# Patient Record
Sex: Female | Born: 1985
Health system: Southern US, Community
[De-identification: ages and names within clinical notes are randomized; demographics above are authoritative.]

## PROBLEM LIST (undated history)

## (undated) ENCOUNTER — Emergency Department (HOSPITAL_COMMUNITY): Admission: EM | Payer: 59

## (undated) DIAGNOSIS — J9690 Respiratory failure, unspecified, unspecified whether with hypoxia or hypercapnia: Secondary | ICD-10-CM

## (undated) DIAGNOSIS — L732 Hidradenitis suppurativa: Secondary | ICD-10-CM

## (undated) DIAGNOSIS — E785 Hyperlipidemia, unspecified: Secondary | ICD-10-CM

## (undated) DIAGNOSIS — K760 Fatty (change of) liver, not elsewhere classified: Secondary | ICD-10-CM

## (undated) DIAGNOSIS — R112 Nausea with vomiting, unspecified: Secondary | ICD-10-CM

## (undated) DIAGNOSIS — G4730 Sleep apnea, unspecified: Secondary | ICD-10-CM

## (undated) DIAGNOSIS — Z9889 Other specified postprocedural states: Secondary | ICD-10-CM

## (undated) DIAGNOSIS — E282 Polycystic ovarian syndrome: Secondary | ICD-10-CM

## (undated) DIAGNOSIS — N6019 Diffuse cystic mastopathy of unspecified breast: Secondary | ICD-10-CM

## (undated) DIAGNOSIS — E669 Obesity, unspecified: Secondary | ICD-10-CM

## (undated) DIAGNOSIS — K7460 Unspecified cirrhosis of liver: Secondary | ICD-10-CM

## (undated) DIAGNOSIS — F17200 Nicotine dependence, unspecified, uncomplicated: Secondary | ICD-10-CM

## (undated) DIAGNOSIS — J45909 Unspecified asthma, uncomplicated: Secondary | ICD-10-CM

## (undated) DIAGNOSIS — K219 Gastro-esophageal reflux disease without esophagitis: Secondary | ICD-10-CM

## (undated) DIAGNOSIS — A498 Other bacterial infections of unspecified site: Secondary | ICD-10-CM

## (undated) DIAGNOSIS — Z87442 Personal history of urinary calculi: Secondary | ICD-10-CM

## (undated) HISTORY — DX: Obesity, unspecified: E66.9

## (undated) HISTORY — DX: Hyperlipidemia, unspecified: E78.5

## (undated) HISTORY — DX: Hidradenitis suppurativa: L73.2

## (undated) HISTORY — DX: Other bacterial infections of unspecified site: A49.8

## (undated) HISTORY — DX: Nicotine dependence, unspecified, uncomplicated: F17.200

---
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
## 2004-06-09 ENCOUNTER — Emergency Department (HOSPITAL_COMMUNITY): Admission: EM | Admit: 2004-06-09 | Discharge: 2004-06-09 | Payer: Self-pay | Admitting: Emergency Medicine

## 2007-04-08 ENCOUNTER — Emergency Department (HOSPITAL_COMMUNITY): Admission: EM | Admit: 2007-04-08 | Discharge: 2007-04-08 | Payer: Self-pay | Admitting: Emergency Medicine

## 2008-04-20 ENCOUNTER — Encounter (INDEPENDENT_AMBULATORY_CARE_PROVIDER_SITE_OTHER): Payer: Self-pay | Admitting: *Deleted

## 2008-06-08 ENCOUNTER — Encounter: Payer: Self-pay | Admitting: Gastroenterology

## 2008-06-08 ENCOUNTER — Ambulatory Visit: Payer: Self-pay | Admitting: Internal Medicine

## 2008-06-08 DIAGNOSIS — K7689 Other specified diseases of liver: Secondary | ICD-10-CM

## 2008-06-08 DIAGNOSIS — R1011 Right upper quadrant pain: Secondary | ICD-10-CM | POA: Insufficient documentation

## 2008-06-08 DIAGNOSIS — R740 Nonspecific elevation of levels of transaminase and lactic acid dehydrogenase [LDH]: Secondary | ICD-10-CM

## 2008-06-08 DIAGNOSIS — R7401 Elevation of levels of liver transaminase levels: Secondary | ICD-10-CM | POA: Insufficient documentation

## 2008-06-08 DIAGNOSIS — K7581 Nonalcoholic steatohepatitis (NASH): Secondary | ICD-10-CM | POA: Insufficient documentation

## 2008-06-09 ENCOUNTER — Telehealth (INDEPENDENT_AMBULATORY_CARE_PROVIDER_SITE_OTHER): Payer: Self-pay

## 2008-06-09 ENCOUNTER — Encounter: Payer: Self-pay | Admitting: Internal Medicine

## 2008-06-09 ENCOUNTER — Encounter: Payer: Self-pay | Admitting: Gastroenterology

## 2008-06-19 ENCOUNTER — Encounter: Payer: Self-pay | Admitting: Gastroenterology

## 2008-06-19 LAB — CONVERTED CEMR LAB
AST: 56 units/L — ABNORMAL HIGH (ref 0–37)
Alkaline Phosphatase: 65 units/L (ref 39–117)
Bilirubin, Direct: 0.1 mg/dL (ref 0.0–0.3)
Hep B S Ab: NEGATIVE
Hepatitis B Surface Ag: NEGATIVE
Indirect Bilirubin: 0.5 mg/dL (ref 0.0–0.9)
Total Protein: 7 g/dL (ref 6.0–8.3)

## 2008-06-21 ENCOUNTER — Encounter: Payer: Self-pay | Admitting: Gastroenterology

## 2008-06-21 ENCOUNTER — Ambulatory Visit (HOSPITAL_COMMUNITY): Admission: RE | Admit: 2008-06-21 | Discharge: 2008-06-21 | Payer: Self-pay | Admitting: Gastroenterology

## 2008-06-23 ENCOUNTER — Telehealth: Payer: Self-pay | Admitting: Gastroenterology

## 2008-09-01 ENCOUNTER — Encounter (INDEPENDENT_AMBULATORY_CARE_PROVIDER_SITE_OTHER): Payer: Self-pay

## 2008-12-19 LAB — CONVERTED CEMR LAB
ALT: 51 units/L — ABNORMAL HIGH (ref 0–35)
Albumin: 4.2 g/dL (ref 3.5–5.2)
Alkaline Phosphatase: 73 units/L (ref 39–117)
Total Bilirubin: 0.5 mg/dL (ref 0.3–1.2)

## 2009-02-06 ENCOUNTER — Telehealth (INDEPENDENT_AMBULATORY_CARE_PROVIDER_SITE_OTHER): Payer: Self-pay

## 2009-04-10 ENCOUNTER — Ambulatory Visit (HOSPITAL_COMMUNITY): Admission: RE | Admit: 2009-04-10 | Discharge: 2009-04-10 | Payer: Self-pay | Admitting: Internal Medicine

## 2009-05-11 ENCOUNTER — Other Ambulatory Visit: Admission: RE | Admit: 2009-05-11 | Discharge: 2009-05-11 | Payer: Self-pay | Admitting: Obstetrics and Gynecology

## 2009-10-03 ENCOUNTER — Ambulatory Visit (HOSPITAL_COMMUNITY): Admission: RE | Admit: 2009-10-03 | Discharge: 2009-10-03 | Payer: Self-pay | Admitting: Specialist

## 2010-01-27 ENCOUNTER — Encounter: Payer: Self-pay | Admitting: Family Medicine

## 2010-02-05 NOTE — Progress Notes (Signed)
Summary: hep b injections and bloodwork  Phone Note Outgoing Call Call back at [facility] Phone [phone]   Summary of Call: called pt to see if she has received her hep b series so she can have bloodwork done to determine immunity. pt stated she has not received the last injection yet and will call us when she does and we will send her order for hep B surface Ab, Quant. Initial call taken by: Hendricks Limes LPN,  February 06, 2009 3:54 PM

## 2010-05-16 ENCOUNTER — Other Ambulatory Visit (HOSPITAL_COMMUNITY)
Admission: RE | Admit: 2010-05-16 | Discharge: 2010-05-16 | Disposition: A | Discharge status: Home / Self Care | Payer: 59 | Source: Ambulatory Visit | Attending: Obstetrics and Gynecology | Admitting: Obstetrics and Gynecology

## 2010-05-16 ENCOUNTER — Other Ambulatory Visit: Payer: Self-pay | Admitting: Adult Health

## 2010-05-16 DIAGNOSIS — Z01419 Encounter for gynecological examination (general) (routine) without abnormal findings: Secondary | ICD-10-CM | POA: Insufficient documentation

## 2010-09-12 ENCOUNTER — Other Ambulatory Visit (HOSPITAL_COMMUNITY): Payer: Self-pay | Admitting: Internal Medicine

## 2010-09-12 ENCOUNTER — Ambulatory Visit (HOSPITAL_COMMUNITY)
Admission: RE | Admit: 2010-09-12 | Discharge: 2010-09-12 | Disposition: A | Discharge status: Home / Self Care | Payer: 59 | Source: Ambulatory Visit | Attending: Internal Medicine | Admitting: Internal Medicine

## 2010-09-12 DIAGNOSIS — R059 Cough, unspecified: Secondary | ICD-10-CM

## 2010-09-12 DIAGNOSIS — R0602 Shortness of breath: Secondary | ICD-10-CM | POA: Insufficient documentation

## 2010-09-12 DIAGNOSIS — R05 Cough: Secondary | ICD-10-CM

## 2010-09-12 DIAGNOSIS — R0789 Other chest pain: Secondary | ICD-10-CM | POA: Insufficient documentation

## 2010-10-13 ENCOUNTER — Encounter: Payer: Self-pay | Admitting: *Deleted

## 2010-10-13 ENCOUNTER — Emergency Department (HOSPITAL_COMMUNITY)
Admission: EM | Admit: 2010-10-13 | Discharge: 2010-10-13 | Disposition: A | Discharge status: Home / Self Care | Payer: 59 | Attending: Emergency Medicine | Admitting: Emergency Medicine

## 2010-10-13 DIAGNOSIS — M79609 Pain in unspecified limb: Secondary | ICD-10-CM | POA: Insufficient documentation

## 2010-10-13 DIAGNOSIS — M109 Gout, unspecified: Secondary | ICD-10-CM | POA: Insufficient documentation

## 2010-10-13 DIAGNOSIS — F17200 Nicotine dependence, unspecified, uncomplicated: Secondary | ICD-10-CM | POA: Insufficient documentation

## 2010-10-13 HISTORY — DX: Gastro-esophageal reflux disease without esophagitis: K21.9

## 2010-10-13 HISTORY — DX: Fatty (change of) liver, not elsewhere classified: K76.0

## 2010-10-13 HISTORY — DX: Diffuse cystic mastopathy of unspecified breast: N60.19

## 2010-10-13 HISTORY — DX: Polycystic ovarian syndrome: E28.2

## 2010-10-13 MED ORDER — HYDROCODONE-ACETAMINOPHEN 5-325 MG PO TABS: 1.0000 | ORAL_TABLET | ORAL | Status: AC | PRN

## 2010-10-13 MED ORDER — HYDROCODONE-ACETAMINOPHEN 5-325 MG PO TABS: 1.0000 | ORAL_TABLET | ORAL | Status: DC | PRN

## 2010-10-13 MED ORDER — PREDNISONE 10 MG PO TABS: 20.0000 mg | ORAL_TABLET | Freq: Every day | ORAL | Status: AC

## 2010-10-13 MED ORDER — PREDNISONE 20 MG PO TABS
60.0000 mg | ORAL_TABLET | Freq: Once | ORAL | Status: AC
  Administered 2010-10-13: 60 mg via ORAL
  Filled 2010-10-13: qty 3

## 2010-10-13 MED ORDER — HYDROCODONE-ACETAMINOPHEN 5-325 MG PO TABS
2.0000 | ORAL_TABLET | Freq: Once | ORAL | Status: AC
  Administered 2010-10-13: 2 via ORAL
  Filled 2010-10-13: qty 2

## 2010-10-13 NOTE — ED Provider Notes (Signed)
History     CSN: [id number] Arrival date & time: 10/13/2010  4:20 AM  Chief Complaint  Patient presents with  . Gout    (Consider location/radiation/quality/duration/timing/severity/associated sxs/prior treatment) HPI Comments: Seen 0443. Patient with a history of gout on allopurinol and colchicine. Pain began 2 days ago and has not improved on her mediations. Unable to bear weight or tolerate any contact with great toe on the right foot. Denies fever, chills.   Patient is a 25 y.o. female presenting with lower extremity pain. The history is provided by the patient.  Foot Pain This is a new problem. The current episode started 2 days ago. The problem occurs constantly. The problem has been gradually worsening. Exacerbated by: walking, pressure, palpation. The symptoms are relieved by nothing. Treatments tried: allopurinol and cochicine. The treatment provided no relief.    Past Medical History  Diagnosis Date  . Diabetes mellitus   . Fibrocystic breast disease   . Acid reflux   . Gout   . Polycystic ovary syndrome   . Fatty liver     History reviewed. No pertinent past surgical history.  Family History  Problem Relation Age of Onset  . Diabetes Father   . Hypertension Father   . Stroke Other     History  Substance Use Topics  . Smoking status: Current Everyday Smoker  . Smokeless tobacco: Not on file  . Alcohol Use: No    OB History    Grav Para Term Preterm Abortions TAB SAB Ect Mult Living                  Review of Systems  Musculoskeletal:       Right great toe pain  All other systems reviewed and are negative.    Allergies  Influenza vaccine live  Home Medications   Current Outpatient Rx  Name Route Sig Dispense Refill  . ALLOPURINOL 100 MG PO TABS Oral Take 100 mg by mouth daily.      . COLCHICINE PO Oral Take by mouth.      . DEXLANSOPRAZOLE 60 MG PO CPDR Oral Take 60 mg by mouth daily.      Marland Kitchen METFORMIN HCL 500 MG (MOD) PO TB24 Oral Take 500  mg by mouth daily with breakfast.      . LOESTRIN 24 FE PO Oral Take by mouth.        BP 126/93  Pulse 84  Temp(Src) 98 F (36.7 C) (Oral)  Resp 20  Ht 5\' 10"  (1.778 m)  Wt 287 lb (130.182 kg)  BMI 41.18 kg/m2  SpO2 100%  LMP 08/11/2010  Physical Exam  Constitutional: She is oriented to person, place, and time. She appears well-developed and well-nourished. She appears distressed.  HENT:  Head: Normocephalic and atraumatic.  Eyes: EOM are normal.  Neck: Normal range of motion.  Cardiovascular: Normal rate, normal heart sounds and intact distal pulses.   Pulmonary/Chest: Effort normal and breath sounds normal.  Musculoskeletal:       Right great toe MCP with erythema and exquisite tenderness with palpation.No evidence of infection, no lesions, no drainage.  Neurological: She is alert and oriented to person, place, and time.  Skin: Skin is warm and dry.    ED Course  Procedures (including critical care time)  Patient with h/o gout here with gout in right great toe. Analgesics and prednisone administered.Pt feels improved after observation and/or treatment in ED. MDM Reviewed: nursing note, vitals and previous chart  Nicoletta Dress. Colon Branch, MD 10/13/10 [phone]

## 2011-01-06 ENCOUNTER — Encounter (HOSPITAL_COMMUNITY): Payer: Self-pay

## 2011-01-06 ENCOUNTER — Inpatient Hospital Stay (HOSPITAL_COMMUNITY)
Admission: EM | Admit: 2011-01-06 | Discharge: 2011-01-11 | DRG: 418 | Disposition: A | Discharge status: Home / Self Care | Payer: 59 | Attending: Internal Medicine | Admitting: Internal Medicine

## 2011-01-06 DIAGNOSIS — K529 Noninfective gastroenteritis and colitis, unspecified: Secondary | ICD-10-CM | POA: Diagnosis present

## 2011-01-06 DIAGNOSIS — R1011 Right upper quadrant pain: Secondary | ICD-10-CM | POA: Diagnosis present

## 2011-01-06 DIAGNOSIS — E871 Hypo-osmolality and hyponatremia: Secondary | ICD-10-CM | POA: Diagnosis present

## 2011-01-06 DIAGNOSIS — Z6841 Body Mass Index (BMI) 40.0 and over, adult: Secondary | ICD-10-CM

## 2011-01-06 DIAGNOSIS — J9819 Other pulmonary collapse: Secondary | ICD-10-CM | POA: Diagnosis not present

## 2011-01-06 DIAGNOSIS — K5289 Other specified noninfective gastroenteritis and colitis: Secondary | ICD-10-CM | POA: Diagnosis present

## 2011-01-06 DIAGNOSIS — E119 Type 2 diabetes mellitus without complications: Secondary | ICD-10-CM | POA: Diagnosis present

## 2011-01-06 DIAGNOSIS — Z720 Tobacco use: Secondary | ICD-10-CM | POA: Diagnosis present

## 2011-01-06 DIAGNOSIS — K810 Acute cholecystitis: Principal | ICD-10-CM | POA: Diagnosis present

## 2011-01-06 DIAGNOSIS — E1169 Type 2 diabetes mellitus with other specified complication: Secondary | ICD-10-CM | POA: Diagnosis present

## 2011-01-06 DIAGNOSIS — K7581 Nonalcoholic steatohepatitis (NASH): Secondary | ICD-10-CM | POA: Diagnosis present

## 2011-01-06 DIAGNOSIS — E876 Hypokalemia: Secondary | ICD-10-CM | POA: Diagnosis not present

## 2011-01-06 DIAGNOSIS — E669 Obesity, unspecified: Secondary | ICD-10-CM | POA: Diagnosis present

## 2011-01-06 DIAGNOSIS — E282 Polycystic ovarian syndrome: Secondary | ICD-10-CM | POA: Diagnosis present

## 2011-01-06 DIAGNOSIS — F17200 Nicotine dependence, unspecified, uncomplicated: Secondary | ICD-10-CM | POA: Diagnosis present

## 2011-01-06 DIAGNOSIS — K7689 Other specified diseases of liver: Secondary | ICD-10-CM | POA: Diagnosis present

## 2011-01-06 DIAGNOSIS — R7989 Other specified abnormal findings of blood chemistry: Secondary | ICD-10-CM | POA: Diagnosis present

## 2011-01-06 LAB — DIFFERENTIAL
Lymphocytes Relative: 9 % — ABNORMAL LOW (ref 12–46)
Lymphs Abs: 0.6 10*3/uL — ABNORMAL LOW (ref 0.7–4.0)
Monocytes Absolute: 0.5 10*3/uL (ref 0.1–1.0)
Monocytes Relative: 8 % (ref 3–12)

## 2011-01-06 LAB — URINALYSIS, ROUTINE W REFLEX MICROSCOPIC: Bilirubin Urine: NEGATIVE

## 2011-01-06 LAB — BASIC METABOLIC PANEL
BUN: 10 mg/dL (ref 6–23)
CO2: 19 mEq/L (ref 19–32)
GFR calc Af Amer: >90 mL/min (ref >90)
GFR calc non Af Amer: >90 mL/min (ref >90)
Glucose, Bld: 107 mg/dL — ABNORMAL HIGH (ref 70–99)
Potassium: 3.6 mEq/L (ref 3.5–5.1)
Sodium: 133 mEq/L — ABNORMAL LOW (ref 135–145)

## 2011-01-06 LAB — HEPATIC FUNCTION PANEL
Albumin: 2.9 g/dL — ABNORMAL LOW (ref 3.5–5.2)
Alkaline Phosphatase: 55 U/L (ref 39–117)
Indirect Bilirubin: 0.5 mg/dL (ref 0.3–0.9)
Total Protein: 5.9 g/dL — ABNORMAL LOW (ref 6.0–8.3)

## 2011-01-06 LAB — CBC
HCT: 47.4 % — ABNORMAL HIGH (ref 36.0–46.0)
MCV: 87.3 fL (ref 78.0–100.0)
WBC: 6.6 10*3/uL (ref 4.0–10.5)

## 2011-01-06 LAB — GLUCOSE, CAPILLARY: Glucose-Capillary: 90 mg/dL (ref 70–99)

## 2011-01-06 LAB — AMYLASE: Amylase: 30 U/L (ref 0–105)

## 2011-01-06 MED ORDER — HYDROMORPHONE HCL PF 1 MG/ML IJ SOLN
0.5000 mg | Freq: Once | INTRAMUSCULAR | Status: AC
  Administered 2011-01-06: 1 mg via INTRAVENOUS

## 2011-01-06 MED ORDER — SODIUM CHLORIDE 0.9 % IV BOLUS (SEPSIS)
1000.0000 mL | Freq: Once | INTRAVENOUS | Status: AC
  Administered 2011-01-06: 1000 mL via INTRAVENOUS

## 2011-01-06 MED ORDER — PROMETHAZINE HCL 25 MG/ML IJ SOLN
12.5000 mg | Freq: Once | INTRAMUSCULAR | Status: AC
  Administered 2011-01-06: 25 mg via INTRAVENOUS
  Filled 2011-01-06: qty 1

## 2011-01-06 MED ORDER — PROMETHAZINE HCL 25 MG/ML IJ SOLN
12.5000 mg | Freq: Once | INTRAMUSCULAR | Status: AC
  Administered 2011-01-06: 12.5 mg via INTRAVENOUS
  Filled 2011-01-06: qty 1

## 2011-01-06 MED ORDER — SODIUM CHLORIDE 0.9 % IV BOLUS (SEPSIS): 1000.0000 mL | Freq: Once | INTRAVENOUS | Status: DC

## 2011-01-06 MED ORDER — PANTOPRAZOLE SODIUM 40 MG IV SOLR
40.0000 mg | Freq: Once | INTRAVENOUS | Status: AC
  Administered 2011-01-06: 40 mg via INTRAVENOUS
  Filled 2011-01-06: qty 40

## 2011-01-06 MED ORDER — HYDROMORPHONE HCL PF 1 MG/ML IJ SOLN
1.0000 mg | Freq: Once | INTRAMUSCULAR | Status: DC
  Filled 2011-01-06: qty 1

## 2011-01-06 MED ORDER — HYDROMORPHONE HCL PF 1 MG/ML IJ SOLN
1.0000 mg | Freq: Once | INTRAMUSCULAR | Status: AC
  Administered 2011-01-06: 1 mg via INTRAVENOUS
  Filled 2011-01-06: qty 1

## 2011-01-06 MED ORDER — METOCLOPRAMIDE HCL 5 MG/ML IJ SOLN
10.0000 mg | Freq: Once | INTRAMUSCULAR | Status: AC
  Administered 2011-01-06: 10 mg via INTRAVENOUS
  Filled 2011-01-06: qty 2

## 2011-01-06 MED ORDER — ONDANSETRON HCL 4 MG/2ML IJ SOLN
4.0000 mg | Freq: Once | INTRAMUSCULAR | Status: AC
  Administered 2011-01-06: 4 mg via INTRAVENOUS
  Filled 2011-01-06: qty 2

## 2011-01-06 NOTE — ED Provider Notes (Signed)
History   This chart was scribed for [employer]. Colon Branch, MD by Melba Coon. The patient was seen in room APA04/APA04 and the patient's care was started at 5:18PM.    CSN: [id number]  Arrival date & time 01/06/11  1653   First MD Initiated Contact with Patient 01/06/11 1712      Chief Complaint  Patient presents with  . Emesis  . Fever  . Weakness  . Diarrhea  . Loss of Consciousness    (Consider location/radiation/quality/duration/timing/severity/associated sxs/prior treatment) HPI Cheryl Black is a 25 y.o. female who presents to the Emergency Department complaining of constant, moderate to severe emesis with an onset today. Patient also has diarrhea and pain near the gall bladder. Pt is a Engineer, civil (consulting) at [employer].  PCP: Dr. Margo Aye    Past Medical History  Diagnosis Date  . Diabetes mellitus   . Fibrocystic breast disease   . Acid reflux   . Gout   . Polycystic ovary syndrome   . Fatty liver     History reviewed. No pertinent past surgical history.  Family History  Problem Relation Age of Onset  . Diabetes Father   . Hypertension Father   . Stroke Other     History  Substance Use Topics  . Smoking status: Current Everyday Smoker  . Smokeless tobacco: Not on file  . Alcohol Use: No    OB History    Grav Para Term Preterm Abortions TAB SAB Ect Mult Living                  Review of Systems 10 Systems reviewed and are negative for acute change except as noted in the HPI.  Allergies  Influenza vaccine live  Home Medications   Current Outpatient Rx  Name Route Sig Dispense Refill  . ALLOPURINOL 100 MG PO TABS Oral Take 100 mg by mouth at bedtime.     . COLCHICINE 0.6 MG PO TABS Oral Take 0.6 mg by mouth at bedtime.      . DEXLANSOPRAZOLE 60 MG PO CPDR Oral Take 60 mg by mouth at bedtime.     Marland Kitchen METFORMIN HCL ER (MOD) 500 MG PO TB24 Oral Take 500 mg by mouth at bedtime.     Marland Kitchen LOESTRIN 24 FE PO Oral Take 1 tablet by mouth at bedtime.     Marland Kitchen  ONDANSETRON HCL 8 MG PO TABS Oral Take by mouth every 8 (eight) hours as needed. For nausea     . PROMETHAZINE HCL 25 MG PO TABS Oral Take 25 mg by mouth every 6 (six) hours as needed. For nausea     . HYDROCODONE-ACETAMINOPHEN 5-325 MG PO TABS Oral Take 1 tablet by mouth every 4 (four) hours as needed. For pain       BP 140/112  Pulse 132  Temp(Src) 98.9 F (37.2 C) (Oral)  Resp 26  Ht 5\' 9"  (1.753 m)  Wt 287 lb (130.182 kg)  BMI 42.38 kg/m2  SpO2 99%  Physical Exam  Nursing note and vitals reviewed. Constitutional: She is oriented to person, place, and time. She appears well-developed and well-nourished. No distress.  HENT:  Head: Normocephalic and atraumatic.  Right Ear: External ear normal.  Left Ear: External ear normal.  Eyes: Conjunctivae and EOM are normal. Pupils are equal, round, and reactive to light.  Neck: Normal range of motion. Neck supple. No tracheal deviation present.  Cardiovascular: Regular rhythm and normal heart sounds.  Exam reveals no gallop and no friction  rub.   No murmur heard.      Slightly tachycardic  Pulmonary/Chest: Effort normal and breath sounds normal. No respiratory distress. She has no wheezes. She has no rales.  Abdominal: Soft. Bowel sounds are normal. She exhibits no distension. There is tenderness (Focal tenderness on right sided costal margin, but no RUQ focal tenderness).       Hyperactive bowel sounds.  Musculoskeletal: Normal range of motion. She exhibits no edema.  Neurological: She is alert and oriented to person, place, and time. No sensory deficit.  Skin: Skin is warm and dry. No rash noted.  Psychiatric: She has a normal mood and affect. Her behavior is normal.    ED Course  Procedures (including critical care time)  DIAGNOSTIC STUDIES: Oxygen Saturation is 99% on room air, normal by my interpretation.   Results for orders placed during the hospital encounter of 01/06/11  CBC      Component Value Range   WBC 6.6  4.0 - 10.5  (K/uL)   RBC 5.43 (*) 3.87 - 5.11 (MIL/uL)   Hemoglobin 16.2 (*) 12.0 - 15.0 (g/dL)   HCT 16.1 (*) 09.6 - 46.0 (%)   MCV 87.3  78.0 - 100.0 (fL)   MCH 29.8  26.0 - 34.0 (pg)   MCHC 34.2  30.0 - 36.0 (g/dL)   RDW 04.5  40.9 - 81.1 (%)   Platelets 227  150 - 400 (K/uL)  DIFFERENTIAL      Component Value Range   Neutrophils Relative 82 (*) 43 - 77 (%)   Neutro Abs 5.4  1.7 - 7.7 (K/uL)   Lymphocytes Relative 9 (*) 12 - 46 (%)   Lymphs Abs 0.6 (*) 0.7 - 4.0 (K/uL)   Monocytes Relative 8  3 - 12 (%)   Monocytes Absolute 0.5  0.1 - 1.0 (K/uL)   Eosinophils Relative 1  0 - 5 (%)   Eosinophils Absolute 0.1  0.0 - 0.7 (K/uL)   Basophils Relative 0  0 - 1 (%)   Basophils Absolute 0.0  0.0 - 0.1 (K/uL)  BASIC METABOLIC PANEL      Component Value Range   Sodium 133 (*) 135 - 145 (mEq/L)   Potassium 3.6  3.5 - 5.1 (mEq/L)   Chloride 100  96 - 112 (mEq/L)   CO2 19  19 - 32 (mEq/L)   Glucose, Bld 107 (*) 70 - 99 (mg/dL)   BUN 10  6 - 23 (mg/dL)   Creatinine, Ser 9.14  0.50 - 1.10 (mg/dL)   Calcium 9.3  8.4 - 78.2 (mg/dL)   GFR calc non Af Amer >90  >90 (mL/min)   GFR calc Af Amer >90  >90 (mL/min)  URINALYSIS, ROUTINE W REFLEX MICROSCOPIC      Component Value Range   Color, Urine YELLOW  YELLOW    APPearance CLEAR  CLEAR    Specific Gravity, Urine 1.015  1.005 - 1.030    pH 7.0  5.0 - 8.0    Glucose, UA NEGATIVE  NEGATIVE (mg/dL)   Hgb urine dipstick NEGATIVE  NEGATIVE    Bilirubin Urine NEGATIVE  NEGATIVE    Ketones, ur 40 (*) NEGATIVE (mg/dL)   Protein, ur NEGATIVE  NEGATIVE (mg/dL)   Urobilinogen, UA 1.0  0.0 - 1.0 (mg/dL)   Nitrite NEGATIVE  NEGATIVE    Leukocytes, UA NEGATIVE  NEGATIVE   GLUCOSE, CAPILLARY      Component Value Range   Glucose-Capillary 90  70 - 99 (mg/dL)  HEPATIC FUNCTION PANEL  Component Value Range   Total Protein 5.9 (*) 6.0 - 8.3 (g/dL)   Albumin 2.9 (*) 3.5 - 5.2 (g/dL)   AST 41 (*) 0 - 37 (U/L)   ALT 87 (*) 0 - 35 (U/L)   Alkaline Phosphatase  55  39 - 117 (U/L)   Total Bilirubin 0.6  0.3 - 1.2 (mg/dL)   Bilirubin, Direct 0.1  0.0 - 0.3 (mg/dL)   Indirect Bilirubin 0.5  0.3 - 0.9 (mg/dL)  AMYLASE      Component Value Range   Amylase 30  0 - 105 (U/L)   COORDINATION OF CARE:  5:58PM - Zofran is not helping with heaving; Dr ordered additional medication 18:30 Continued vomiting. 20:00 Continued nausea and vomiting. 22:15 Up to bathroom. Needed assistance to get back to the room. Vomiting, nausea and right sided pain.  2314 Spoke with Dr. Orvan Falconer who will admit the patient for symptomatic treatment of gastroenteritis.    MDM  Patient with vomiting and diarrhea that began this morning. Began IVF resuscitation. She has received 4 liters of IVF, continued nausea and vomiting. Labs are unremarkable. Will arrange for admission. Spoke with Dr. Orvan Falconer who will admit patient for symptomatic treatment of gastroenteritis.  I personally performed the services described in this documentation, which was scribed in my presence. The recorded information has been reviewed and considered.  Marland KitchenCRITICAL CARE Performed by: Annamarie Dawley   Total critical care time:40  Critical care time was exclusive of separately billable procedures and treating other patients.  Critical care was necessary to treat or prevent imminent or life-threatening deterioration.  Critical care was time spent personally by me on the following activities: development of treatment plan with patient and/or surrogate as well as nursing, discussions with consultants, evaluation of patient's response to treatment, examination of patient, obtaining history from patient or surrogate, ordering and performing treatments and interventions, ordering and review of laboratory studies, ordering and review of radiographic studies, pulse oximetry and re-evaluation of patient's condition.      Nicoletta Dress. Colon Branch, MD 01/06/11 2329

## 2011-01-06 NOTE — ED Notes (Signed)
Patient is resting comfortably. 

## 2011-01-06 NOTE — H&P (Signed)
PCP:   Dwana Melena, MD   Chief Complaint:  Vomiting and diarrhea since this morning  HPI: Cheryl Black is an 25 y.o. female.  Diabetes, on metformin, polycystic ovary disease, morbidly obese Caucasian lady; works as an Charity fundraiser in our emergency room. He developed nausea vomiting and diarrhea while at work at about 9 AM this. Had a syncopal, went home but nausea vomiting retching and diarrhea persisted, and she returned to the emergency room where she was found to dehydrated tachycardic, and despite prolonged resuscitation continues to be dizzy with standing, continuous to retch, and have loose stools.  Denies fever cough or cold chest pains or shortness of breath; denies blood or black in vomitus or diarrhea.  Usually smokes half a pack of cigarettes per day.  Chronic right flank pain for some weeks  Rewiew of Systems:  The patient denies , weight loss,, vision loss, decreased hearing, hoarseness, chest pain, dyspnea on exertion, peripheral edema, balance deficits, hemoptysis, abdominal pain, melena, hematochezia, severe indigestion/heartburn, hematuria, incontinence, genital sores, muscle weakness, suspicious skin lesions, transient blindness, difficulty walking, depression, unusual weight change, abnormal bleeding, enlarged lymph nodes, angioedema, and breast masses.    Past Medical History  Diagnosis Date  . Diabetes mellitus   . Fibrocystic breast disease   . Acid reflux   . Gout   . Polycystic ovary syndrome   . Fatty liver     History reviewed. No pertinent past surgical history.  Medications:  HOME MEDS: Prior to Admission medications   Medication Sig Start Date End Date Taking? Authorizing Provider  allopurinol (ZYLOPRIM) 100 MG tablet Take 100 mg by mouth at bedtime.    Yes Historical Provider, MD  colchicine 0.6 MG tablet Take 0.6 mg by mouth at bedtime.     Yes Historical Provider, MD  dexlansoprazole (DEXILANT) 60 MG capsule Take 60 mg by mouth at bedtime.    Yes  Historical Provider, MD  metFORMIN (GLUMETZA) 500 MG (MOD) 24 hr tablet Take 500 mg by mouth at bedtime.    Yes Historical Provider, MD  Norethin Ace-Eth Estrad-FE (LOESTRIN 24 FE PO) Take 1 tablet by mouth at bedtime.    Yes Historical Provider, MD  ondansetron (ZOFRAN) 8 MG tablet Take by mouth every 8 (eight) hours as needed. For nausea    Yes Historical Provider, MD  promethazine (PHENERGAN) 25 MG tablet Take 25 mg by mouth every 6 (six) hours as needed. For nausea    Yes Historical Provider, MD  HYDROcodone-acetaminophen (NORCO) 5-325 MG per tablet Take 1 tablet by mouth every 4 (four) hours as needed. For pain     Historical Provider, MD     Allergies:  Allergies  Allergen Reactions  . Influenza Vaccine Live     Social History:   reports that she has been smoking Cigarettes.  She has been smoking about .5 packs per day. She does not have any smokeless tobacco history on file. She reports that she does not drink alcohol or use illicit drugs.  Family History: Family History  Problem Relation Age of Onset  . Diabetes Father   . Hypertension Father   . Stroke Other      Physical Exam: Filed Vitals:   01/06/11 1658 01/06/11 2320  BP: 140/112 119/69  Pulse: 132 110  Temp: 98.9 F (37.2 C) 100.1 F (37.8 C)  TempSrc: Oral   Resp: 26   Height: 5\' 9"  (1.753 m)   Weight: 130.182 kg (287 lb)   SpO2: 99%    Blood  pressure 119/69, pulse 110, temperature 100.1 F (37.8 C), temperature source Oral, resp. rate 26, height 5\' 9"  (1.753 m), weight 130.182 kg (287 lb), SpO2 99.00%.  GEN:  Ill-looking morbidly obese young Caucasian lady lying in the stretcher cooperative with exam PSYCH:  alert and oriented x4;  HEENT: Mucous membranes pink and dry and anicteric; PERRLA; EOM intact; no cervical lymphadenopathy nor thyromegaly or carotid bruit; no JVD; thick neck Breasts:: Not examined CHEST WALL: No tenderness CHEST: Normal respiration, clear to auscultation bilaterally HEART:  Regular rate and rhythm; no murmurs rubs or gallops BACK: No kyphosis or scoliosis; no CVA tenderness ABDOMEN: Obese, soft non-tender; no masses, no organomegaly, normal abdominal bowel sounds; Rectal Exam: Not done EXTREMITIES: No bone or joint deformity; age-appropriate arthropathy of the hands and knees; no edema; no ulcerations. Genitalia: not examined PULSES: 2+ and symmetric SKIN: Normal hydration no rash or ulceration CNS: Cranial nerves 2-12 grossly intact no focal neurologic deficit   Labs & Imaging Results for orders placed during the hospital encounter of 01/06/11 (from the past 48 hour(s))  GLUCOSE, CAPILLARY     Status: Normal   Collection Time   01/06/11  5:21 PM      Component Value Range Comment   Glucose-Capillary 90  70 - 99 (mg/dL)   CBC     Status: Abnormal   Collection Time   01/06/11  5:28 PM      Component Value Range Comment   WBC 6.6  4.0 - 10.5 (K/uL)    RBC 5.43 (*) 3.87 - 5.11 (MIL/uL)    Hemoglobin 16.2 (*) 12.0 - 15.0 (g/dL)    HCT 04.5 (*) 40.9 - 46.0 (%)    MCV 87.3  78.0 - 100.0 (fL)    MCH 29.8  26.0 - 34.0 (pg)    MCHC 34.2  30.0 - 36.0 (g/dL)    RDW 81.1  91.4 - 78.2 (%)    Platelets 227  150 - 400 (K/uL)   DIFFERENTIAL     Status: Abnormal   Collection Time   01/06/11  5:28 PM      Component Value Range Comment   Neutrophils Relative 82 (*) 43 - 77 (%)    Neutro Abs 5.4  1.7 - 7.7 (K/uL)    Lymphocytes Relative 9 (*) 12 - 46 (%)    Lymphs Abs 0.6 (*) 0.7 - 4.0 (K/uL)    Monocytes Relative 8  3 - 12 (%)    Monocytes Absolute 0.5  0.1 - 1.0 (K/uL)    Eosinophils Relative 1  0 - 5 (%)    Eosinophils Absolute 0.1  0.0 - 0.7 (K/uL)    Basophils Relative 0  0 - 1 (%)    Basophils Absolute 0.0  0.0 - 0.1 (K/uL)   BASIC METABOLIC PANEL     Status: Abnormal   Collection Time   01/06/11  5:28 PM      Component Value Range Comment   Sodium 133 (*) 135 - 145 (mEq/L)    Potassium 3.6  3.5 - 5.1 (mEq/L)    Chloride 100  96 - 112 (mEq/L)    CO2  19  19 - 32 (mEq/L)    Glucose, Bld 107 (*) 70 - 99 (mg/dL)    BUN 10  6 - 23 (mg/dL)    Creatinine, Ser 9.56  0.50 - 1.10 (mg/dL)    Calcium 9.3  8.4 - 10.5 (mg/dL)    GFR calc non Af Amer >90  >90 (mL/min)  GFR calc Af Amer >90  >90 (mL/min)   URINALYSIS, ROUTINE W REFLEX MICROSCOPIC     Status: Abnormal   Collection Time   01/06/11  6:27 PM      Component Value Range Comment   Color, Urine YELLOW  YELLOW     APPearance CLEAR  CLEAR     Specific Gravity, Urine 1.015  1.005 - 1.030     pH 7.0  5.0 - 8.0     Glucose, UA NEGATIVE  NEGATIVE (mg/dL)    Hgb urine dipstick NEGATIVE  NEGATIVE     Bilirubin Urine NEGATIVE  NEGATIVE     Ketones, ur 40 (*) NEGATIVE (mg/dL)    Protein, ur NEGATIVE  NEGATIVE (mg/dL)    Urobilinogen, UA 1.0  0.0 - 1.0 (mg/dL)    Nitrite NEGATIVE  NEGATIVE     Leukocytes, UA NEGATIVE  NEGATIVE  MICROSCOPIC NOT DONE ON URINES WITH NEGATIVE PROTEIN, BLOOD, LEUKOCYTES, NITRITE, OR GLUCOSE <1000 mg/dL.  HEPATIC FUNCTION PANEL     Status: Abnormal   Collection Time   01/06/11 10:34 PM      Component Value Range Comment   Total Protein 5.9 (*) 6.0 - 8.3 (g/dL)    Albumin 2.9 (*) 3.5 - 5.2 (g/dL)    AST 41 (*) 0 - 37 (U/L)    ALT 87 (*) 0 - 35 (U/L)    Alkaline Phosphatase 55  39 - 117 (U/L)    Total Bilirubin 0.6  0.3 - 1.2 (mg/dL)    Bilirubin, Direct 0.1  0.0 - 0.3 (mg/dL)    Indirect Bilirubin 0.5  0.3 - 0.9 (mg/dL)   AMYLASE     Status: Normal   Collection Time   01/06/11 10:34 PM      Component Value Range Comment   Amylase 30  0 - 105 (U/L)    No results found.    Assessment Present on Admission:  .Gastroenteritis .Diabetes mellitus type 2 in obese .Morbid obesity .Polycystic ovary disease .FATTY LIVER DISEASE .RUQ PAIN .Tobacco abuse   PLAN: This lady on observation for management of her gastroenteritis;  will empirically give antiemetics but will not given antidiarrheal agents at this time;  Continue vigorous IV fluid  resuscitation; Consult on tobacco cessation, she declines a nicotine patch. Continuous sliding scale insulin while n.p.o.  Send for C. difficile PCR because of her exposure to a healthcare environment  Other plans as per orders.    Justice Milliron 01/06/2011, 11:43 PM

## 2011-01-06 NOTE — ED Notes (Signed)
Pt presents with vomiting, fever. Syncopal episode, diarrhea since this AM. Pt states she passed out on the toilet but did not hit head.

## 2011-01-06 NOTE — ED Notes (Signed)
Pt states feels a little better however is still having dry heaves.  Glucose is 107 off lab work. Pt informed of glucose results.

## 2011-01-06 NOTE — ED Notes (Signed)
Pt presents with dry heaves, States started vomiting this morning , left work early to rest and pt has had no relief from vomiting. Pt states Has taken Zofran and phenergan without success. Pt reports last time vomited liquid was at 1600. Pt also reports upper rt quad pain. abd soft nondistended with positive BS audible.

## 2011-01-07 ENCOUNTER — Inpatient Hospital Stay (HOSPITAL_COMMUNITY): Payer: 59

## 2011-01-07 DIAGNOSIS — E876 Hypokalemia: Secondary | ICD-10-CM | POA: Diagnosis not present

## 2011-01-07 DIAGNOSIS — E871 Hypo-osmolality and hyponatremia: Secondary | ICD-10-CM | POA: Diagnosis present

## 2011-01-07 DIAGNOSIS — R7989 Other specified abnormal findings of blood chemistry: Secondary | ICD-10-CM | POA: Diagnosis present

## 2011-01-07 LAB — CBC
HCT: 40 % (ref 36.0–46.0)
Hemoglobin: 13.6 g/dL (ref 12.0–15.0)
MCH: 30.2 pg (ref 26.0–34.0)
Platelets: 190 10*3/uL (ref 150–400)
RBC: 4.5 MIL/uL (ref 3.87–5.11)
RDW: 13 % (ref 11.5–15.5)

## 2011-01-07 LAB — BASIC METABOLIC PANEL
BUN: 7 mg/dL (ref 6–23)
Chloride: 105 mEq/L (ref 96–112)
Glucose, Bld: 108 mg/dL — ABNORMAL HIGH (ref 70–99)
Potassium: 3.4 mEq/L — ABNORMAL LOW (ref 3.5–5.1)

## 2011-01-07 LAB — GLUCOSE, CAPILLARY
Glucose-Capillary: 99 mg/dL (ref 70–99)
Glucose-Capillary: 82 mg/dL (ref 70–99)

## 2011-01-07 MED ORDER — ENOXAPARIN SODIUM 40 MG/0.4ML ~~LOC~~ SOLN
40.0000 mg | SUBCUTANEOUS | Status: DC
  Administered 2011-01-07: 80 mg via SUBCUTANEOUS
  Administered 2011-01-08 – 2011-01-11 (×3): 40 mg via SUBCUTANEOUS
  Filled 2011-01-07: qty 0.4
  Filled 2011-01-07: qty 0.8
  Filled 2011-01-07 (×2): qty 0.4

## 2011-01-07 MED ORDER — INSULIN ASPART 100 UNIT/ML ~~LOC~~ SOLN: 0.0000 [IU] | Freq: Three times a day (TID) | SUBCUTANEOUS | Status: DC

## 2011-01-07 MED ORDER — POTASSIUM CHLORIDE IN NACL 20-0.9 MEQ/L-% IV SOLN
INTRAVENOUS | Status: DC
  Administered 2011-01-07 (×2): via INTRAVENOUS

## 2011-01-07 MED ORDER — INSULIN ASPART 100 UNIT/ML ~~LOC~~ SOLN: 0.0000 [IU] | SUBCUTANEOUS | Status: DC

## 2011-01-07 MED ORDER — MAGNESIUM SULFATE 50 % IJ SOLN
1.0000 g | Freq: Once | INTRAVENOUS | Status: AC
  Administered 2011-01-07: 1 g via INTRAVENOUS
  Filled 2011-01-07: qty 2

## 2011-01-07 MED ORDER — ONDANSETRON HCL 4 MG PO TABS
4.0000 mg | ORAL_TABLET | ORAL | Status: DC | PRN
  Administered 2011-01-07 (×2): 4 mg via ORAL
  Filled 2011-01-07 (×2): qty 1

## 2011-01-07 MED ORDER — IBUPROFEN 800 MG PO TABS
400.0000 mg | ORAL_TABLET | ORAL | Status: DC | PRN
  Administered 2011-01-07 – 2011-01-11 (×4): 400 mg via ORAL
  Filled 2011-01-07 (×3): qty 1

## 2011-01-07 MED ORDER — HYDROMORPHONE HCL PF 1 MG/ML IJ SOLN
0.5000 mg | INTRAMUSCULAR | Status: DC | PRN
  Administered 2011-01-07 (×2): 1 mg via INTRAVENOUS
  Filled 2011-01-07 (×2): qty 1

## 2011-01-07 MED ORDER — OXYCODONE HCL 5 MG PO TABS
5.0000 mg | ORAL_TABLET | ORAL | Status: DC | PRN
  Administered 2011-01-07 – 2011-01-10 (×3): 5 mg via ORAL
  Filled 2011-01-07 (×3): qty 1

## 2011-01-07 MED ORDER — FAMOTIDINE IN NACL 20-0.9 MG/50ML-% IV SOLN
20.0000 mg | Freq: Two times a day (BID) | INTRAVENOUS | Status: DC
  Administered 2011-01-07 – 2011-01-11 (×8): 20 mg via INTRAVENOUS
  Filled 2011-01-07 (×13): qty 50

## 2011-01-07 MED ORDER — ONDANSETRON HCL 4 MG/2ML IJ SOLN
4.0000 mg | Freq: Four times a day (QID) | INTRAMUSCULAR | Status: DC | PRN
  Administered 2011-01-07 – 2011-01-11 (×7): 4 mg via INTRAVENOUS
  Filled 2011-01-07 (×6): qty 2

## 2011-01-07 MED ORDER — INSULIN ASPART 100 UNIT/ML ~~LOC~~ SOLN: 0.0000 [IU] | Freq: Every day | SUBCUTANEOUS | Status: DC

## 2011-01-07 MED ORDER — POTASSIUM CHLORIDE IN NACL 40-0.9 MEQ/L-% IV SOLN
INTRAVENOUS | Status: DC
  Administered 2011-01-07 – 2011-01-10 (×7): via INTRAVENOUS
  Filled 2011-01-07 (×15): qty 1000

## 2011-01-07 NOTE — Progress Notes (Signed)
Subjective:  She has not had a bowel movement since yesterday afternoon. She still feels a little woozy when she stands up. Her vomiting has resolved. She still had a little nausea.  Objective: Vital signs in last 24 hours: Filed Vitals:   01/06/11 1658 01/06/11 2320 01/07/11 0500 01/07/11 0627  BP: 140/112 119/69  111/75  Pulse: 132 110  97  Temp: 98.9 F (37.2 C) 100.1 F (37.8 C)  98.3 F (36.8 C)  TempSrc: Oral   Oral  Resp: 26   18  Height: 5\' 9"  (1.753 m)     Weight: 130.182 kg (287 lb)  95.029 kg (209 lb 8 oz)   SpO2: 99%   95%   No intake or output data in the 24 hours ending 01/07/11 0917  Weight change:   Physical exam: Lungs: Clear to auscultation bilaterally. Heart: S1, S2, with borderline tachycardia. Abdomen: Positive bowel sounds, obese, mildly to moderately tender in the right upper quadrant. No peritoneal signs, no masses palpated, no hepatosplenomegaly. Extremities: No pedal edema. Neurologic: She is alert and oriented x3.  Lab Results: Basic Metabolic Panel:  Basename 01/07/11 0512 01/06/11 1728  NA 135 133*  K 3.4* 3.6  CL 105 100  CO2 22 19  GLUCOSE 108* 107*  BUN 7 10  CREATININE 0.62 0.64  CALCIUM 8.0* 9.3  MG 1.4* --  PHOS -- --   Liver Function Tests:  [facility] 01/06/11 2234  AST 41*  ALT 87*  ALKPHOS 55  BILITOT 0.6  PROT 5.9*  ALBUMIN 2.9*    Basename 01/06/11 2234  LIPASE --  AMYLASE 30   No results found for this basename: AMMONIA:2 in the last 72 hours CBC:  Basename 01/07/11 0512 01/06/11 1728  WBC 5.1 6.6  NEUTROABS -- 5.4  HGB 13.6 16.2*  HCT 40.0 47.4*  MCV 88.9 87.3  PLT 190 227   Cardiac Enzymes: No results found for this basename: CKTOTAL:3,CKMB:3,CKMBINDEX:3,TROPONINI:3 in the last 72 hours BNP: No results found for this basename: PROBNP:3 in the last 72 hours D-Dimer: No results found for this basename: DDIMER:2 in the last 72 hours CBG:  Basename 01/07/11 0744 01/07/11 0509 01/06/11 1721  GLUCAP 91  99 90   Hemoglobin A1C: No results found for this basename: HGBA1C in the last 72 hours Fasting Lipid Panel: No results found for this basename: CHOL,HDL,LDLCALC,TRIG,CHOLHDL,LDLDIRECT in the last 72 hours Thyroid Function Tests: No results found for this basename: TSH,T4TOTAL,FREET4,T3FREE,THYROIDAB in the last 72 hours Anemia Panel: No results found for this basename: VITAMINB12,FOLATE,FERRITIN,TIBC,IRON,RETICCTPCT in the last 72 hours Coagulation: No results found for this basename: LABPROT:2,INR:2 in the last 72 hours Urine Drug Screen: Drugs of Abuse  No results found for this basename: labopia, cocainscrnur, labbenz, amphetmu, thcu, labbarb    Alcohol Level: No results found for this basename: ETH:2 in the last 72 hours Urinalysis:   Micro: No results found for this or any previous visit (from the past 240 hour(s)).  Studies/Results: No results found.  Medications: I have reviewed the patient's current medications.  Assessment: Active Problems:  FATTY LIVER DISEASE  RUQ PAIN  Gastroenteritis  Diabetes mellitus type 2 in obese  Morbid obesity  Polycystic ovary disease  Tobacco abuse  Hyponatremia  Hypokalemia  Elevated LFTs  1. Nausea, vomiting, and diarrhea, presumed to be secondary to an acute gastroenteritis. Symptomatically, these are resolving.  Elevated liver transaminases. This could be activity to her history of fatty liver. However, 2given her family history of gallbladder disease and right upper quadrant  tenderness, we will order an ultrasound of her abdomen.  Type 2 diabetes mellitus. Her capillary blood glucose is low normal given that she is n.p.o. currently.  Hyponatremia. Resolved with IV fluids.  Hypokalemia. Likely secondary to IV fluids and recent diarrhea.  Hypomagnesemia.  Tobacco abuse. She declines a nicotine patch. Tobacco cessation counseling has been ordered.   Plan: 1. Will add more potassium to her IV fluids. We'll give her 1 g  of magnesium sulfate.  We'll advance her diet to a clear liquid diet. We'll hold on restarting oral medications until she is to take them without nausea and vomiting.  We'll order an ultrasound of her abdomen to assess for acute gallbladder changes.  We'll start prophylactic H2 blocker IV. We'll check followup laboratory studies in the morning.   LOS: 1 day   Allis Quirarte 01/07/2011, 9:17 AM

## 2011-01-08 ENCOUNTER — Inpatient Hospital Stay (HOSPITAL_COMMUNITY): Payer: 59

## 2011-01-08 ENCOUNTER — Encounter (HOSPITAL_COMMUNITY): Payer: Self-pay

## 2011-01-08 LAB — GLUCOSE, CAPILLARY
Glucose-Capillary: 99 mg/dL (ref 70–99)
Glucose-Capillary: 101 mg/dL — ABNORMAL HIGH (ref 70–99)

## 2011-01-08 LAB — COMPREHENSIVE METABOLIC PANEL
AST: 56 U/L — ABNORMAL HIGH (ref 0–37)
Albumin: 2.8 g/dL — ABNORMAL LOW (ref 3.5–5.2)
Alkaline Phosphatase: 47 U/L (ref 39–117)
BUN: 4 mg/dL — ABNORMAL LOW (ref 6–23)
CO2: 28 mEq/L (ref 19–32)
Chloride: 105 mEq/L (ref 96–112)
Potassium: 4.2 mEq/L (ref 3.5–5.1)
Total Bilirubin: 0.3 mg/dL (ref 0.3–1.2)

## 2011-01-08 LAB — CBC
HCT: 40.9 % (ref 36.0–46.0)
MCHC: 33.3 g/dL (ref 30.0–36.0)
MCV: 90.1 fL (ref 78.0–100.0)
RDW: 13 % (ref 11.5–15.5)

## 2011-01-08 LAB — LIPASE, BLOOD: Lipase: 34 U/L (ref 11–59)

## 2011-01-08 MED ORDER — SINCALIDE 5 MCG IJ SOLR
0.0200 ug/kg | Freq: Once | INTRAMUSCULAR | Status: AC
  Administered 2011-01-08: 2.65 ug via INTRAVENOUS

## 2011-01-08 MED ORDER — TECHNETIUM TC 99M MEBROFENIN IV KIT
5.0000 | PACK | Freq: Once | INTRAVENOUS | Status: AC | PRN
  Administered 2011-01-08: 5.5 via INTRAVENOUS

## 2011-01-08 MED ORDER — METOCLOPRAMIDE HCL 5 MG/ML IJ SOLN
5.0000 mg | Freq: Four times a day (QID) | INTRAMUSCULAR | Status: DC
  Administered 2011-01-08: 5 mg via INTRAVENOUS
  Filled 2011-01-08: qty 2

## 2011-01-08 MED ORDER — SINCALIDE 5 MCG IJ SOLR
INTRAMUSCULAR | Status: AC
  Administered 2011-01-08: 2.65 ug via INTRAVENOUS
  Filled 2011-01-08: qty 5

## 2011-01-08 NOTE — Progress Notes (Signed)
Subjective: Patient started having more abdominal discomfort this morning, although this is more in the lower quadrants. She still having some pain in the right upper quadrant although less so. Still some nausea without vomiting. No other complaints.  Objective: Vital signs in last 24 hours: Filed Vitals:   01/07/11 2203 01/08/11 0706 01/08/11 0905 01/08/11 1335  BP: 104/69 125/80 119/76 104/70  Pulse: 60 68 64 61  Temp: 97.5 F (36.4 C) 97.9 F (36.6 C) 98.4 F (36.9 C) 98.9 F (37.2 C)  TempSrc: Oral  Oral Oral  Resp: 20 20 22 20   Height:      Weight:  133.267 kg (293 lb 12.8 oz)    SpO2: 98% 97% 96% 98%    Intake/Output Summary (Last 24 hours) at 01/08/11 1729 Last data filed at 01/08/11 0700  Gross per 24 hour  Intake   3781 ml  Output      0 ml  Net   3781 ml    Weight change:   Physical exam: Lungs: Clear to auscultation bilaterally. Heart: S1, S2, with borderline tachycardia. Abdomen: Positive bowel sounds, obese, mildly  tender in the right upper quadrant. No peritoneal signs, no masses palpated, no hepatosplenomegaly. No real tenderness in the lower quadrants even with palpation. Extremities: No pedal edema. Neurologic: She is alert and oriented x3.  Lab Results: Basic Metabolic Panel:  Basename 01/08/11 0556 01/07/11 0512  NA 137 135  K 4.2 3.4*  CL 105 105  CO2 28 22  GLUCOSE 112* 108*  BUN 4* 7  CREATININE 0.72 0.62  CALCIUM 9.1 8.0*  MG -- 1.4*  PHOS -- --   Liver Function Tests:  [facility] 01/08/11 0556 01/06/11 2234  AST 56* 41*  ALT 88* 87*  ALKPHOS 47 55  BILITOT 0.3 0.6  PROT 5.9* 5.9*  ALBUMIN 2.8* 2.9*    Basename 01/08/11 0556 01/06/11 2234  LIPASE 34 --  AMYLASE -- 30   CBC:  Basename 01/08/11 0556 01/07/11 0512 01/06/11 1728  WBC 7.0 5.1 --  NEUTROABS -- -- 5.4  HGB 13.6 13.6 --  HCT 40.9 40.0 --  MCV 90.1 88.9 --  PLT 190 190 --   CBG:  Basename 01/08/11 1108 01/08/11 0725 01/07/11 1605 01/07/11 1207 01/07/11 0744  01/07/11 0509  GLUCAP 99 136* 89 82 91 99   Hemoglobin A1C:  Basename 01/08/11 0556  HGBA1C 6.3*   Thyroid Function Tests:  Basename 01/08/11 0556  TSH 2.368  T4TOTAL --  FREET4 --  T3FREE --  THYROIDAB --    Studies/Results: US Abdomen Complete 01/07/2011    IMPRESSION:  1.  Diffuse hepatic steatosis without focal hepatic parenchymal abnormality. 2.  Small amount of gallbladder sludge.  No evidence of cholelithiasis or acute cholecystitis. 3.  Otherwise normal examination.   Nm Hepato W/eject Fract 01/08/2011   IMPRESSION: Cystic and common bile ducts are patent.  Abnormally low gallbladder ejection fraction calculated at zero.   Medications: I have reviewed the patient's current medications.  Assessment: Active Problems:  FATTY LIVER DISEASE: Patient is aware of this, this is not a new diagnosis. Counseled on weight loss and she will have this issue followed by her [place].  Acute cholecystitis: Confirmed by ultrasound and hida scan. We'll keep on clear liquids and consult surgery.    Diabetes mellitus type 2: Actually well controlled, with A1c of 6.3   Morbid obesity   Polycystic ovary disease   Tobacco abuse: Declined nicotine patch   Hyponatremia: Resolved, secondary dehydration.  Hypokalemia: Resolved, secondary dehydration.   Elevated LFTs: Felt to be secondary to steatosis   Hypomagnesemia: Result. Felt to be secondary to dehydration    LOS: 2 days   Gevena Barre K 01/08/2011, 5:29 PM

## 2011-01-09 LAB — GLUCOSE, CAPILLARY
Glucose-Capillary: 120 mg/dL — ABNORMAL HIGH (ref 70–99)
Glucose-Capillary: 110 mg/dL — ABNORMAL HIGH (ref 70–99)

## 2011-01-09 MED ORDER — CEFAZOLIN SODIUM-DEXTROSE 2-3 GM-% IV SOLR
2.0000 g | INTRAVENOUS | Status: DC
  Filled 2011-01-09: qty 50

## 2011-01-09 MED ORDER — ZOLPIDEM TARTRATE 5 MG PO TABS
10.0000 mg | ORAL_TABLET | Freq: Every evening | ORAL | Status: DC | PRN
  Administered 2011-01-09: 5 mg via ORAL
  Filled 2011-01-09: qty 1

## 2011-01-09 NOTE — Progress Notes (Signed)
Subjective: Decreased abdominal pain today. Still some nausea but no vomiting. Tolerating clears.  Objective: Vital signs in last 24 hours: Filed Vitals:   01/09/11 0621 01/09/11 0622 01/09/11 1008 01/09/11 1411  BP: 134/84 127/89 146/84 125/77  Pulse: 60 65 52 53  Temp:   97.6 F (36.4 C) 97.1 F (36.2 C)  TempSrc:   Oral Oral  Resp:   20 18  Height:      Weight:      SpO2:   97% 97%    Intake/Output Summary (Last 24 hours) at 01/09/11 1826 Last data filed at 01/09/11 1800  Gross per 24 hour  Intake   2540 ml  Output      1 ml  Net   2539 ml    Weight change:   Physical exam: Lungs: Clear to auscultation bilaterally. Heart: S1, S2, with borderline tachycardia. Abdomen: Positive bowel sounds, obese, mildly  tender in the right upper quadrant. No peritoneal signs, no masses palpated, no hepatosplenomegaly.Extremities: No pedal edema. Neurologic: She is alert and oriented x3.  Lab Results: Basic Metabolic Panel:  Basename 01/08/11 0556 01/07/11 0512  NA 137 135  Black 4.2 3.4*  CL 105 105  CO2 28 22  GLUCOSE 112* 108*  BUN 4* 7  CREATININE 0.72 0.62  CALCIUM 9.1 8.0*  MG -- 1.4*  PHOS -- --   Liver Function Tests:  [facility] 01/08/11 0556 01/06/11 2234  AST 56* 41*  ALT 88* 87*  ALKPHOS 47 55  BILITOT 0.3 0.6  PROT 5.9* 5.9*  ALBUMIN 2.8* 2.9*    Basename 01/08/11 0556 01/06/11 2234  LIPASE 34 --  AMYLASE -- 30   CBC:  Basename 01/08/11 0556 01/07/11 0512  WBC 7.0 5.1  NEUTROABS -- --  HGB 13.6 13.6  HCT 40.9 40.0  MCV 90.1 88.9  PLT 190 190   CBG:  Basename 01/09/11 1704 01/09/11 1124 01/09/11 0746 01/08/11 2234 01/08/11 1610 01/08/11 1108  GLUCAP 120* 100* 107* 101* 91 99   Hemoglobin A1C:  Basename 01/08/11 0556  HGBA1C 6.3*   Thyroid Function Tests:  Basename 01/08/11 0556  TSH 2.368  T4TOTAL --  FREET4 --  T3FREE --  THYROIDAB --    Studies/Results: US Abdomen Complete 01/07/2011    IMPRESSION:  1.  Diffuse hepatic steatosis  without focal hepatic parenchymal abnormality. 2.  Small amount of gallbladder sludge.  No evidence of cholelithiasis or acute cholecystitis. 3.  Otherwise normal examination.   Nm Hepato W/eject Fract 01/08/2011   IMPRESSION: Cystic and common bile ducts are patent.  Abnormally low gallbladder ejection fraction calculated at zero.   Medications: I have reviewed the patient's current medications.  Assessment: Active Problems:  FATTY LIVER DISEASE: Plan for liver biopsy during cholecystectomy tomorrow.  Acute cholecystitis: Appreciate surgery consult. Plan is for cholecystectomy tomorrow.    Diabetes mellitus type 2: Actually well controlled, with A1c of 6.3   Morbid obesity   Polycystic ovary disease   Tobacco abuse: Declined nicotine patch   Hyponatremia: Resolved, secondary dehydration.   Hypokalemia: Resolved, secondary dehydration.   Elevated LFTs: Felt to be secondary to steatosis   Hypomagnesemia: Result. Felt to be secondary to dehydration    LOS: 3 days   Cheryl Black 01/09/2011, 6:26 PM

## 2011-01-09 NOTE — Consult Note (Signed)
Reason for Consult: Biliary colic Referring Physician: [facility]  Cheryl Black is an 26 y.o. female.  HPI: Patient is a 26 year old white female who presents with right upper quadrant abdominal pain, nausea, vomiting. Ultrasound of gallbladder revealed a fatty liver as well as biliary sludge. The common bile duct was within normal limits. Height is scan revealed a 0 gallbladder ejection fraction. Her symptoms were reproduced with a hiatus scan. She states she's had these symptoms on and off for the last 4 months. They have worsened. No fever, chills, or jaundice have been noted.  Past Medical History  Diagnosis Date  . Diabetes mellitus   . Fibrocystic breast disease   . Acid reflux   . Gout   . Polycystic ovary syndrome   . Fatty liver     History reviewed. No pertinent past surgical history.  Family History  Problem Relation Age of Onset  . Diabetes Father   . Hypertension Father   . Stroke Other     Social History:  reports that she has been smoking Cigarettes.  She has been smoking about .5 packs per day. She does not have any smokeless tobacco history on file. She reports that she does not drink alcohol or use illicit drugs.  Allergies:  Allergies  Allergen Reactions  . Influenza Vaccine Live     Medications: I have reviewed the patient's current medications.  Results for orders placed during the hospital encounter of 01/06/11 (from the past 48 hour(s))  GLUCOSE, CAPILLARY     Status: Normal   Collection Time   01/07/11  4:05 PM      Component Value Range Comment   Glucose-Capillary 89  70 - 99 (mg/dL)    Comment 1 Notify RN      Comment 2 Documented in Chart     COMPREHENSIVE METABOLIC PANEL     Status: Abnormal   Collection Time   01/08/11  5:56 AM      Component Value Range Comment   Sodium 137  135 - 145 (mEq/L)    Potassium 4.2  3.5 - 5.1 (mEq/L) DELTA CHECK NOTED   Chloride 105  96 - 112 (mEq/L)    CO2 28  19 - 32 (mEq/L)    Glucose, Bld 112 (*)  70 - 99 (mg/dL)    BUN 4 (*) 6 - 23 (mg/dL)    Creatinine, Ser 0.72  0.50 - 1.10 (mg/dL)    Calcium 9.1  8.4 - 10.5 (mg/dL)    Total Protein 5.9 (*) 6.0 - 8.3 (g/dL)    Albumin 2.8 (*) 3.5 - 5.2 (g/dL)    AST 56 (*) 0 - 37 (U/L)    ALT 88 (*) 0 - 35 (U/L)    Alkaline Phosphatase 47  39 - 117 (U/L)    Total Bilirubin 0.3  0.3 - 1.2 (mg/dL)    GFR calc non Af Amer >90  >90 (mL/min)    GFR calc Af Amer >90  >90 (mL/min)   LIPASE, BLOOD     Status: Normal   Collection Time   01/08/11  5:56 AM      Component Value Range Comment   Lipase 34  11 - 59 (U/L)   CBC     Status: Normal   Collection Time   01/08/11  5:56 AM      Component Value Range Comment   WBC 7.0  4.0 - 10.5 (K/uL)    RBC 4.54  3.87 - 5.11 (MIL/uL)    Hemoglobin  13.6  12.0 - 15.0 (g/dL)    HCT 40.9  36.0 - 46.0 (%)    MCV 90.1  78.0 - 100.0 (fL)    MCH 30.0  26.0 - 34.0 (pg)    MCHC 33.3  30.0 - 36.0 (g/dL)    RDW 13.0  11.5 - 15.5 (%)    Platelets 190  150 - 400 (K/uL)   TSH     Status: Normal   Collection Time   01/08/11  5:56 AM      Component Value Range Comment   TSH 2.368  0.350 - 4.500 (uIU/mL)   HEMOGLOBIN A1C     Status: Abnormal   Collection Time   01/08/11  5:56 AM      Component Value Range Comment   Hemoglobin A1C 6.3 (*) <5.7 (%)    Mean Plasma Glucose 134 (*) <117 (mg/dL)   GLUCOSE, CAPILLARY     Status: Abnormal   Collection Time   01/08/11  7:25 AM      Component Value Range Comment   Glucose-Capillary 136 (*) 70 - 99 (mg/dL)   GLUCOSE, CAPILLARY     Status: Normal   Collection Time   01/08/11 11:08 AM      Component Value Range Comment   Glucose-Capillary 99  70 - 99 (mg/dL)    Comment 1 Notify RN     GLUCOSE, CAPILLARY     Status: Normal   Collection Time   01/08/11  4:10 PM      Component Value Range Comment   Glucose-Capillary 91  70 - 99 (mg/dL)    Comment 1 Notify RN      Comment 2 Documented in Chart     GLUCOSE, CAPILLARY     Status: Abnormal   Collection Time   01/08/11 10:34 PM       Component Value Range Comment   Glucose-Capillary 101 (*) 70 - 99 (mg/dL)    Comment 1 Notify RN     GLUCOSE, CAPILLARY     Status: Abnormal   Collection Time   01/09/11  7:46 AM      Component Value Range Comment   Glucose-Capillary 107 (*) 70 - 99 (mg/dL)   GLUCOSE, CAPILLARY     Status: Abnormal   Collection Time   01/09/11 11:24 AM      Component Value Range Comment   Glucose-Capillary 100 (*) 70 - 99 (mg/dL)     Nm Hepato W/eject Fract  01/08/2011  *RADIOLOGY REPORT*  Clinical Data:  Right upper quadrant pain  NUCLEAR MEDICINE HEPATOBILIARY IMAGING WITH GALLBLADDER EF  Technique:  Sequential images of the abdomen were obtained out to 60 minutes following intravenous administration of radiopharmaceutical.  After slow intravenous infusion of 2.65 micrograms Cholecystokinin, gallbladder ejection fraction was determined.  Radiopharmaceutical:  5.5 mCi Tc-67mCholetec  Comparison:  None.  Findings: Gallbladder activity occurs after 45 minutes.  Small bowel activity occurs after 10 minutes. Gallbladder ejection fraction is zero after 30 minutes.  The patient did experience right upper quadrant pain symptoms during CCK infusion.  IMPRESSION: Cystic and common bile ducts are patent.  Abnormally low gallbladder ejection fraction calculated at zero.  Original Report Authenticated By: AJamas Lav M.D.    ROS: See chart Blood pressure 146/84, pulse 52, temperature 97.6 F (36.4 C), temperature source Oral, resp. rate 20, height 5' 9"  (1.753 m), weight 132.677 kg (292 lb 8 oz), SpO2 97.00%. Physical Exam: Moderately obese white female in no acute distress. HEENT examination reveals no  scleral icterus. Lungs clear to auscultation with equal breath sounds bilaterally. Heart examination reveals a regular rate and rhythm without S3, S4, murmurs. Abdomen is soft with tenderness noted right upper quadrant to deep palpation. No hepatosplenomegaly, masses, or hernias are  palpable.  Assessment/Plan: Impression: Biliary colic, chronic cholecystitis Plan: Patient scheduled undergo a laparoscopic cholecystectomy as well as a liver biopsy tomorrow. The risks and benefits of the procedures including bleeding, infection, hepatobiliary injury, the possibly an open procedure were fully explained to the patient, gave informed consent.  Tasmine Hipwell A 01/09/2011, 1:53 PM

## 2011-01-10 ENCOUNTER — Encounter (HOSPITAL_COMMUNITY): Payer: Self-pay | Admitting: Anesthesiology

## 2011-01-10 ENCOUNTER — Encounter (HOSPITAL_COMMUNITY)
Admission: EM | Disposition: A | Discharge status: Home / Self Care | Payer: Self-pay | Source: Home / Self Care | Attending: Internal Medicine

## 2011-01-10 ENCOUNTER — Inpatient Hospital Stay (HOSPITAL_COMMUNITY): Payer: 59 | Admitting: Anesthesiology

## 2011-01-10 ENCOUNTER — Other Ambulatory Visit: Payer: Self-pay | Admitting: General Surgery

## 2011-01-10 ENCOUNTER — Encounter (HOSPITAL_COMMUNITY): Payer: Self-pay | Admitting: *Deleted

## 2011-01-10 HISTORY — PX: CHOLECYSTECTOMY: SHX55

## 2011-01-10 HISTORY — PX: LIVER BIOPSY: SHX301

## 2011-01-10 LAB — GLUCOSE, CAPILLARY: Glucose-Capillary: 98 mg/dL (ref 70–99)

## 2011-01-10 LAB — SURGICAL PCR SCREEN
MRSA, PCR: NEGATIVE
Staphylococcus aureus: NEGATIVE

## 2011-01-10 SURGERY — LAPAROSCOPIC CHOLECYSTECTOMY
Anesthesia: General | Wound class: Contaminated

## 2011-01-10 MED ORDER — ONDANSETRON HCL 4 MG/2ML IJ SOLN
4.0000 mg | Freq: Once | INTRAMUSCULAR | Status: DC
Start: 2011-01-10 — End: 2011-01-10

## 2011-01-10 MED ORDER — HYDROMORPHONE HCL PF 1 MG/ML IJ SOLN
0.5000 mg | INTRAMUSCULAR | Status: DC | PRN
  Administered 2011-01-10: 0.5 mg via INTRAVENOUS

## 2011-01-10 MED ORDER — GLYCOPYRROLATE 0.2 MG/ML IJ SOLN
0.2000 mg | Freq: Once | INTRAMUSCULAR | Status: AC | PRN
  Administered 2011-01-10: 0.2 mg via INTRAVENOUS

## 2011-01-10 MED ORDER — ACETAMINOPHEN 325 MG PO TABS: 325.0000 mg | ORAL_TABLET | ORAL | Status: DC | PRN

## 2011-01-10 MED ORDER — MORPHINE SULFATE 2 MG/ML IJ SOLN
2.0000 mg | INTRAMUSCULAR | Status: DC | PRN
  Administered 2011-01-10: 2 mg via INTRAVENOUS
  Filled 2011-01-10: qty 1

## 2011-01-10 MED ORDER — SUFENTANIL CITRATE 50 MCG/ML IV SOLN
INTRAVENOUS | Status: DC | PRN
  Administered 2011-01-10: 20 ug via INTRAVENOUS
  Administered 2011-01-10: 10 ug via INTRAVENOUS

## 2011-01-10 MED ORDER — NEOSTIGMINE METHYLSULFATE 1 MG/ML IJ SOLN
INTRAMUSCULAR | Status: DC | PRN
  Administered 2011-01-10: 4 mg via INTRAVENOUS

## 2011-01-10 MED ORDER — KETOROLAC TROMETHAMINE 30 MG/ML IJ SOLN
INTRAMUSCULAR | Status: AC
  Administered 2011-01-10: 30 mg via INTRAVENOUS
  Filled 2011-01-10: qty 1

## 2011-01-10 MED ORDER — LIDOCAINE HCL (CARDIAC) 10 MG/ML IV SOLN
INTRAVENOUS | Status: DC | PRN
  Administered 2011-01-10: 50 mg via INTRAVENOUS

## 2011-01-10 MED ORDER — ROCURONIUM BROMIDE 100 MG/10ML IV SOLN
INTRAVENOUS | Status: DC | PRN
  Administered 2011-01-10: 20 mg via INTRAVENOUS

## 2011-01-10 MED ORDER — FENTANYL CITRATE 0.05 MG/ML IJ SOLN: 25.0000 ug | INTRAMUSCULAR | Status: DC | PRN

## 2011-01-10 MED ORDER — SODIUM CHLORIDE 0.9 % IR SOLN
Status: DC | PRN
  Administered 2011-01-10: 1000 mL

## 2011-01-10 MED ORDER — HYDROMORPHONE HCL PF 1 MG/ML IJ SOLN
INTRAMUSCULAR | Status: AC
  Administered 2011-01-10: 0.5 mg via INTRAVENOUS
  Filled 2011-01-10: qty 1

## 2011-01-10 MED ORDER — GLYCOPYRROLATE 0.2 MG/ML IJ SOLN
INTRAMUSCULAR | Status: DC | PRN
  Administered 2011-01-10: .6 mg via INTRAVENOUS

## 2011-01-10 MED ORDER — ONDANSETRON HCL 4 MG/2ML IJ SOLN: 4.0000 mg | Freq: Once | INTRAMUSCULAR | Status: DC | PRN

## 2011-01-10 MED ORDER — SODIUM CHLORIDE 0.9 % IV SOLN
INTRAVENOUS | Status: DC
  Administered 2011-01-10 – 2011-01-11 (×2): via INTRAVENOUS

## 2011-01-10 MED ORDER — KETOROLAC TROMETHAMINE 30 MG/ML IJ SOLN
30.0000 mg | Freq: Once | INTRAMUSCULAR | Status: AC
  Administered 2011-01-10: 30 mg via INTRAVENOUS

## 2011-01-10 MED ORDER — SUFENTANIL CITRATE 50 MCG/ML IV SOLN
INTRAVENOUS | Status: AC
  Filled 2011-01-10: qty 1

## 2011-01-10 MED ORDER — LACTATED RINGERS IV SOLN
INTRAVENOUS | Status: DC
  Administered 2011-01-10: 09:00:00 via INTRAVENOUS

## 2011-01-10 MED ORDER — PROMETHAZINE HCL 25 MG/ML IJ SOLN
6.2500 mg | INTRAMUSCULAR | Status: DC | PRN
  Administered 2011-01-10: 12.5 mg via INTRAVENOUS

## 2011-01-10 MED ORDER — GLYCOPYRROLATE 0.2 MG/ML IJ SOLN
INTRAMUSCULAR | Status: AC
  Administered 2011-01-10: 0.2 mg via INTRAVENOUS
  Filled 2011-01-10: qty 1

## 2011-01-10 MED ORDER — LACTATED RINGERS IV SOLN
INTRAVENOUS | Status: DC | PRN
  Administered 2011-01-10: 09:00:00 via INTRAVENOUS

## 2011-01-10 MED ORDER — HYDROMORPHONE HCL PF 1 MG/ML IJ SOLN
1.0000 mg | INTRAMUSCULAR | Status: DC | PRN
  Administered 2011-01-10: 1 mg via INTRAVENOUS
  Filled 2011-01-10: qty 1

## 2011-01-10 MED ORDER — BUPIVACAINE HCL 0.5 % IJ SOLN
INTRAMUSCULAR | Status: DC | PRN
  Administered 2011-01-10: 8 mL

## 2011-01-10 MED ORDER — BUPIVACAINE HCL (PF) 0.5 % IJ SOLN
INTRAMUSCULAR | Status: AC
  Filled 2011-01-10: qty 30

## 2011-01-10 MED ORDER — ONDANSETRON HCL 4 MG/2ML IJ SOLN
INTRAMUSCULAR | Status: AC
  Administered 2011-01-10: 4 mg via INTRAVENOUS
  Filled 2011-01-10: qty 2

## 2011-01-10 MED ORDER — MIDAZOLAM HCL 2 MG/2ML IJ SOLN
1.0000 mg | INTRAMUSCULAR | Status: DC | PRN
  Administered 2011-01-10: 2 mg via INTRAVENOUS

## 2011-01-10 MED ORDER — HYDROMORPHONE HCL PF 1 MG/ML IJ SOLN
0.2500 mg | INTRAMUSCULAR | Status: DC | PRN
  Administered 2011-01-10 (×3): 0.5 mg via INTRAVENOUS

## 2011-01-10 MED ORDER — HEMOSTATIC AGENTS (NO CHARGE) OPTIME
TOPICAL | Status: DC | PRN
  Administered 2011-01-10: 1 via TOPICAL

## 2011-01-10 MED ORDER — PROPOFOL 10 MG/ML IV EMUL
INTRAVENOUS | Status: DC | PRN
  Administered 2011-01-10: 200 mg via INTRAVENOUS

## 2011-01-10 MED ORDER — SUCCINYLCHOLINE CHLORIDE 20 MG/ML IJ SOLN
INTRAMUSCULAR | Status: DC | PRN
  Administered 2011-01-10: 120 mg via INTRAVENOUS

## 2011-01-10 MED ORDER — PROMETHAZINE HCL 25 MG/ML IJ SOLN
INTRAMUSCULAR | Status: AC
  Administered 2011-01-10: 12.5 mg via INTRAVENOUS
  Filled 2011-01-10: qty 1

## 2011-01-10 MED ORDER — CEFAZOLIN SODIUM 1-5 GM-% IV SOLN
INTRAVENOUS | Status: DC | PRN
  Administered 2011-01-10: 2 g via INTRAVENOUS

## 2011-01-10 MED ORDER — CEFAZOLIN SODIUM-DEXTROSE 2-3 GM-% IV SOLR
INTRAVENOUS | Status: AC
  Filled 2011-01-10: qty 50

## 2011-01-10 MED ORDER — MIDAZOLAM HCL 2 MG/2ML IJ SOLN
INTRAMUSCULAR | Status: AC
  Administered 2011-01-10: 2 mg via INTRAVENOUS
  Filled 2011-01-10: qty 2

## 2011-01-10 SURGICAL SUPPLY — 32 items
APPLIER CLIP ROT 10 11.4 M/L (STAPLE) ×3
BAG HAMPER (MISCELLANEOUS) ×3 IMPLANT
CLIP APPLIE ROT 10 11.4 M/L (STAPLE) ×2 IMPLANT
CLOTH BEACON ORANGE TIMEOUT ST (SAFETY) ×3 IMPLANT
COVER LIGHT HANDLE STERIS (MISCELLANEOUS) ×6 IMPLANT
DECANTER SPIKE VIAL GLASS SM (MISCELLANEOUS) ×3 IMPLANT
DURAPREP 26ML APPLICATOR (WOUND CARE) ×3 IMPLANT
ELECT REM PT RETURN 9FT ADLT (ELECTROSURGICAL) ×3
ELECTRODE REM PT RTRN 9FT ADLT (ELECTROSURGICAL) ×2 IMPLANT
FILTER SMOKE EVAC LAPAROSHD (FILTER) ×3 IMPLANT
FORMALIN 10 PREFIL 120ML (MISCELLANEOUS) ×3 IMPLANT
GLOVE BIO SURGEON STRL SZ7.5 (GLOVE) ×3 IMPLANT
GLOVE BIOGEL M 6.5 STRL (GLOVE) ×3 IMPLANT
GLOVE BIOGEL M 7.0 STRL (GLOVE) ×3 IMPLANT
GLOVE INDICATOR 7.0 STRL GRN (GLOVE) ×3 IMPLANT
GLOVE INDICATOR 7.5 STRL GRN (GLOVE) ×3 IMPLANT
GOWN STRL REIN XL XLG (GOWN DISPOSABLE) ×9 IMPLANT
HEMOSTAT SNOW SURGICEL 2X4 (HEMOSTASIS) ×3 IMPLANT
INST SET LAPROSCOPIC AP (KITS) ×3 IMPLANT
KIT ROOM TURNOVER APOR (KITS) ×3 IMPLANT
KIT TROCAR LAP CHOLE (TROCAR) ×3 IMPLANT
MANIFOLD NEPTUNE II (INSTRUMENTS) ×3 IMPLANT
NS IRRIG 1000ML POUR BTL (IV SOLUTION) ×3 IMPLANT
PACK LAP CHOLE LZT030E (CUSTOM PROCEDURE TRAY) ×3 IMPLANT
PAD ARMBOARD 7.5X6 YLW CONV (MISCELLANEOUS) ×3 IMPLANT
POUCH SPECIMEN RETRIEVAL 10MM (ENDOMECHANICALS) ×3 IMPLANT
SET BASIN LINEN APH (SET/KITS/TRAYS/PACK) ×3 IMPLANT
SPONGE GAUZE 2X2 8PLY STRL LF (GAUZE/BANDAGES/DRESSINGS) ×12 IMPLANT
STAPLER VISISTAT (STAPLE) ×3 IMPLANT
SUT VICRYL 0 UR6 27IN ABS (SUTURE) ×3 IMPLANT
WARMER LAPAROSCOPE (MISCELLANEOUS) ×3 IMPLANT
YANKAUER SUCT 12FT TUBE ARGYLE (SUCTIONS) ×3 IMPLANT

## 2011-01-10 NOTE — Op Note (Signed)
Patient:  Cheryl Black  DOB:  Feb 07, 1985  MRN:  [id number]   Preop Diagnosis:  Biliary colic, abnormal liver enzyme tests  Postop Diagnosis:  Same, chronic cholecystitis  Procedure: Laparoscopic cholecystectomy, liver biopsy  Surgeon:  Aviva Signs, M.D.  Anes:  General endotracheal  Indications:  Patient is a 26 year old white female presents with biliary colic secondary to chronic cholecystitis and biliary sludge. She also has a fatty liver and elevated liver enzyme test. She now presents for laparoscopic cholecystectomy and liver biopsy. The risks and benefits of the procedure including bleeding, infection, hepatobiliary injury, the possibly of an open procedure were fully explained to the patient, gave informed consent.  Procedure note:  Patient is placed the supine position. After induction of general endotracheal anesthesia, the abdomen was prepped and draped using usual sterile technique with DuraPrep. Surgical site confirmation was performed.  A supraumbilical incision was made down to the fascia. A Veress needle was introduced into the abdominal cavity and confirmation of placement was done using the saline drop test. The abdomen was then insufflated to 16 mm mercury pressure. 11 mm trocar was introduced into the abdominal cavity under direct visualization without difficulty. Patient was placed in reverse Trendelenburg position additional r trocar was placed the epigastric region 5 mm trochars were placed the right the quadrant right flank regions. Liver was inspected and noted to be within normal limits given her body habitus. A Tru-Cut needle biopsy was taken of the right lobe of the liver and sent to pathology further examination. The biopsy site was cauterized with Bovie electrocautery. The gallbladder was then retracted superior laterally. Dissection was begun around the infundibulum the gallbladder. The cystic duct was first identified. Junction to the infundibulum flow  identified. Endoclips placed possibly distally on the cystic duct and cystic duct was divided. This is likewise done cystic artery. The gallbladder was then freed away from the gallbladder fossa using Bovie electrocautery. The gallbladder was delivered through the epigastric trocar site using an Endo Catch bag. The gallbladder fossa was inspected no abnormal bleeding or bile leakage was noted. There was some spillage of bile while removing the gallbladder. Surgicel is placed the gallbladder fossa. All fluid and air were then evacuated from the abdominal cavity prior to removal of the trochars.  All wounds were gave normal saline. All wounds were checked with 0.5% Sensorcaine. The supraumbilical fascia is reapproximated using 0 Vicryl interrupted suture. All skin incisions were closed using staples. Betadine ointment after dressings were applied.  All tape and needle counts were correct at the end of the procedure. Patient was extubated in the operating room and went back to recovery room awake in stable condition.  Complications:  None  EBL:  Minimal  Specimen:  Gallbladder, liver biopsy

## 2011-01-10 NOTE — Progress Notes (Signed)
Subjective: Patient seen status post surgery. Tired. Complaining of some nausea and abdominal pain. Felt a little bit more nauseated with the Dilaudid. No flatus or bowel movement yet..  Objective: Vital signs in last 24 hours: Filed Vitals:   01/10/11 1155 01/10/11 1240 01/10/11 1310 01/10/11 1447  BP: 120/85 121/79 133/89 141/86  Pulse: 62 65 62 71  Temp: 98 F (36.7 C) 97.3 F (36.3 C) 97.2 F (36.2 C) 97.6 F (36.4 C)  TempSrc: Oral Oral Oral Oral  Resp: 17 18 18 18   Height:      Weight:      SpO2: 96% 98% 98% 98%    Intake/Output Summary (Last 24 hours) at 01/10/11 1641 Last data filed at 01/10/11 1504  Gross per 24 hour  Intake 2246.67 ml  Output      1 ml  Net 2245.67 ml    Weight change: -0.167 kg (-5.9 oz)  Physical exam: Lungs: Clear to auscultation bilaterally. Heart: S1, S2, with borderline tachycardia. Abdomen: Tender in right upper quadrant, incisions clean, no drainage few bowel sounds Extremities: No clubbing or cyanosis or edema.  Neurologic: She is alert and oriented x3.  Lab Results:  CBG:  Basename 01/10/11 1144 01/10/11 1034 01/10/11 0743 01/09/11 2135 01/09/11 1704 01/09/11 1124  GLUCAP 106* 114* 110* 110* 120* 100*  Assessment: Active Problems:  FATTY LIVER DISEASE: Status post liver biopsy during surgery. Pathology pending.  Acute cholecystitis: Status post cholecystectomy. Changed a lot of to morphine so patient can tolerate. Monitor overnight. If she is able to tolerate by mouth can discharge her home, likely tomorrow..   Diabetes mellitus type 2: Actually well controlled, with A1c of 6.3   Morbid obesity   Polycystic ovary disease   Tobacco abuse: Declined nicotine patch   Hyponatremia: Resolved, secondary dehydration.   Hypokalemia: Resolved, secondary dehydration.   Elevated LFTs: Felt to be secondary to steatosis   Hypomagnesemia: Result. Felt to be secondary to dehydration    LOS: 4 days   Waldine Zenz K 01/10/2011,  4:41 PM

## 2011-01-10 NOTE — Transfer of Care (Signed)
Immediate Anesthesia Transfer of Care Note  Patient: Cheryl Black  Procedure(s) Performed:  LAPAROSCOPIC CHOLECYSTECTOMY; LIVER BIOPSY  Patient Location: PACU  Anesthesia Type: General  Level of Consciousness: awake, alert  and oriented  Airway & Oxygen Therapy: Patient Spontanous Breathing and Patient connected to face mask oxygen  Post-op Assessment: Report given to PACU RN and Post -op Vital signs reviewed and stable  Post vital signs: Reviewed and stable  Complications: No apparent anesthesia complications

## 2011-01-10 NOTE — Anesthesia Procedure Notes (Signed)
Procedure Name: Intubation Date/Time: 01/10/2011 9:38 AM Performed by: Carolyne Littles, Fumie Fiallo Pre-anesthesia Checklist: Patient identified, Patient being monitored, Emergency Drugs available, Timeout performed and Suction available Patient Re-evaluated:Patient Re-evaluated prior to inductionOxygen Delivery Method: Circle System Utilized Preoxygenation: Pre-oxygenation with 100% oxygen Intubation Type: IV induction, Rapid sequence and Cricoid Pressure applied Laryngoscope Size: Miller and 3 Grade View: Grade I Tube type: Oral Tube size: 7.0 mm Number of attempts: 1 Placement Confirmation: ETT inserted through vocal cords under direct vision,  positive ETCO2 and breath sounds checked- equal and bilateral Secured at: 22 cm Tube secured with: Tape Dental Injury: Teeth and Oropharynx as per pre-operative assessment

## 2011-01-10 NOTE — Preoperative (Signed)
Beta Blockers   Reason not to administer Beta Blockers:Not Applicable 

## 2011-01-10 NOTE — Anesthesia Preprocedure Evaluation (Signed)
Anesthesia Evaluation  Patient identified by MRN, date of birth, ID band Patient awake    Reviewed: Allergy & Precautions, H&P , NPO status , Patient's Chart, lab work & pertinent test results  History of Anesthesia Complications Negative for: history of anesthetic complications  Airway Mallampati: II TM Distance: >3 FB Neck ROM: Full    Dental No notable dental hx.    Pulmonary neg pulmonary ROS,    Pulmonary exam normal       Cardiovascular neg cardio ROS Regular Normal    Neuro/Psych Negative Neurological ROS  Negative Psych ROS   GI/Hepatic GERD-  Medicated and Controlled,  Endo/Other  Diabetes mellitus-, Well Controlled, Type 2, Oral Hypoglycemic AgentsMorbid obesity  Renal/GU      Musculoskeletal   Abdominal (+) obese,  Abdomen: soft.    Peds  Hematology   Anesthesia Other Findings   Reproductive/Obstetrics                           Anesthesia Physical Anesthesia Plan  ASA: II  Anesthesia Plan: General   Post-op Pain Management:    Induction: Intravenous, Rapid sequence and Cricoid pressure planned  Airway Management Planned: Oral ETT  Additional Equipment:   Intra-op Plan:   Post-operative Plan: Extubation in OR  Informed Consent: I have reviewed the patients History and Physical, chart, labs and discussed the procedure including the risks, benefits and alternatives for the proposed anesthesia with the patient or authorized representative who has indicated his/her understanding and acceptance.   Dental advisory given  Plan Discussed with: CRNA  Anesthesia Plan Comments:         Anesthesia Quick Evaluation

## 2011-01-10 NOTE — Anesthesia Postprocedure Evaluation (Addendum)
  Anesthesia Post-op Note  Patient: Cheryl Black  Procedure(s) Performed:  LAPAROSCOPIC CHOLECYSTECTOMY; LIVER BIOPSY  Patient Location: PACU  Anesthesia Type: General  Level of Consciousness: awake, alert , oriented and patient cooperative  Airway and Oxygen Therapy: Patient Spontanous Breathing and Patient connected to face mask oxygen  Post-op Pain: moderate  Post-op Assessment: Post-op Vital signs reviewed, Patient's Cardiovascular Status Stable, Respiratory Function Stable, Patent Airway and No signs of Nausea or vomiting  Post-op Vital Signs: Reviewed and stable  Complications: No apparent anesthesia complications 01/11/10  Pt. Discharged yesterday with no apparent anesthesia complications.

## 2011-01-11 ENCOUNTER — Inpatient Hospital Stay (HOSPITAL_COMMUNITY): Payer: 59

## 2011-01-11 LAB — COMPREHENSIVE METABOLIC PANEL
ALT: 111 U/L — ABNORMAL HIGH (ref 0–35)
AST: 79 U/L — ABNORMAL HIGH (ref 0–37)
Albumin: 3 g/dL — ABNORMAL LOW (ref 3.5–5.2)
Alkaline Phosphatase: 57 U/L (ref 39–117)
Chloride: 102 mEq/L (ref 96–112)
Potassium: 3.8 mEq/L (ref 3.5–5.1)
Total Bilirubin: 0.4 mg/dL (ref 0.3–1.2)

## 2011-01-11 LAB — CBC
HCT: 40.7 % (ref 36.0–46.0)
Platelets: 239 10*3/uL (ref 150–400)
RDW: 12.8 % (ref 11.5–15.5)
WBC: 8 10*3/uL (ref 4.0–10.5)

## 2011-01-11 LAB — GLUCOSE, CAPILLARY

## 2011-01-11 MED ORDER — OXYCODONE HCL 5 MG PO TABS: 5.0000 mg | ORAL_TABLET | ORAL | Status: AC | PRN

## 2011-01-11 NOTE — Progress Notes (Signed)
IV removed, site WNL.  Pt given d/c instructions and new prescriptions.  Discussed home care with patient and discussed home medications, patient verbalizes understanding. F/U appointments to be made by pt (office closed), pt states they will keep appointments. Pt is stable at this time. Pt taken to main entrance in wheelchair by staff member.

## 2011-01-11 NOTE — Plan of Care (Signed)
Problem: Phase II Progression Outcomes Goal: Progress activity as tolerated unless otherwise ordered Outcome: Progressing Pt ambulated down the hall during the night and walked to the bathroom during the night as well. Pt tolerated this well.

## 2011-01-11 NOTE — Progress Notes (Signed)
Placed pt on humidified O2 because she complained of regular O2 drying her out. She was at 92% during the night, I put her on 2.5-3 L. She said that at times she was taking it off, due to discomfort, so I placed her on humidified, and she seems to be wearing the O2 better.

## 2011-01-11 NOTE — Progress Notes (Signed)
CBG machine lost 2200 (01/10/11) reading, no bedtime insulin given. CBG rechecked at 0558, result was 107. Dr. Onalee Hua notified at [phone], no new orders. Sheryn Bison

## 2011-01-11 NOTE — Progress Notes (Signed)
1 Day Post-Op  Subjective: Patient having some shortness of breath with splinting from her upper abdominal incisions. Denies nausea or vomiting.  Objective: Vital signs in last 24 hours: Temp:  [97.2 F (36.2 C)-99 F (37.2 C)] 99 F (37.2 C) (01/05 0946) Pulse Rate:  [57-92] 87  (01/05 0946) Resp:  [13-22] 22  (01/05 0946) BP: (114-166)/(70-92) 123/81 mmHg (01/05 0946) SpO2:  [84 %-99 %] 94 % (01/05 0946) Last BM Date: 01/10/11  Intake/Output from previous day: 01/04 0701 - 01/05 0700 In: 2714 [I.V.:2610; IV Piggyback:104] Out: 3 [Urine:3] Intake/Output this shift:    General appearance: mild distress and Anxious Resp: clear to auscultation bilaterally Cardio: regular rate and rhythm, S1, S2 normal, no murmur, click, rub or gallop GI: Soft, dressings dry and intact. Nondistended.  Lab Results:  No results found for this basename: WBC:2,HGB:2,HCT:2,PLT:2 in the last 72 hours BMET No results found for this basename: NA:2,K:2,CL:2,CO2:2,GLUCOSE:2,BUN:2,CREATININE:2,CALCIUM:2 in the last 72 hours PT/INR No results found for this basename: LABPROT:2,INR:2 in the last 72 hours  Studies/Results: No results found.  Anti-infectives: Anti-infectives     Start     Dose/Rate Route Frequency Ordered Stop   01/10/11 0926   ceFAZolin (ANCEF) 2-3 GM-% IVPB SOLR     Comments: CLAPP, KIM: cabinet override         01/10/11 0926 01/10/11 2129   01/10/11 0600   ceFAZolin (ANCEF) IVPB 2 g/50 mL premix  Status:  Discontinued        2 g 100 mL/hr over 30 Minutes Intravenous 60 min pre-op 01/09/11 1822 01/10/11 1200          Assessment/Plan: s/p Procedure(s): LAPAROSCOPIC CHOLECYSTECTOMY LIVER BIOPSY Impression: Patient is experiencing some shortness of breath with may be secondary to incisional splinting. We'll check a chest x-ray as well as baseline labs.  LOS: 5 days    Reyden Smith A 01/11/2011

## 2011-01-11 NOTE — Discharge Summary (Signed)
DISCHARGE SUMMARY  ATLEY SCARBORO  MR#: [id number]  DOB:01/02/1986  Date of Admission: 01/06/2011 Date of Discharge: 01/11/2011  Attending Physician:Nyshaun Standage K  Patient's CHY:IFOY,DXAJ, MD  Consults:Treatment Team:  Dustin Folks. surgery  Discharge Diagnoses: Present on Admission:  .Gastroenteritis .Diabetes mellitus type 2 in obese .Morbid obesity .Polycystic ovary disease .FATTY LIVER DISEASE .RUQ PAIN .Tobacco abuse .Hyponatremia .Elevated LFTs .Hypomagnesemia    Current Discharge Medication List    START taking these medications   Details  oxyCODONE (OXY IR/ROXICODONE) 5 MG immediate release tablet Take 1 tablet (5 mg total) by mouth every 4 (four) hours as needed. Qty: 30 tablet, Refills: 0      CONTINUE these medications which have NOT CHANGED   Details  allopurinol (ZYLOPRIM) 100 MG tablet Take 100 mg by mouth at bedtime.     colchicine 0.6 MG tablet Take 0.6 mg by mouth at bedtime.      dexlansoprazole (DEXILANT) 60 MG capsule Take 60 mg by mouth at bedtime.     metFORMIN (GLUMETZA) 500 MG (MOD) 24 hr tablet Take 500 mg by mouth at bedtime.     Norethin Ace-Eth Estrad-FE (LOESTRIN 24 FE PO) Take 1 tablet by mouth at bedtime.     ondansetron (ZOFRAN) 8 MG tablet Take by mouth every 8 (eight) hours as needed. For nausea     promethazine (PHENERGAN) 25 MG tablet Take 25 mg by mouth every 6 (six) hours as needed. For nausea       STOP taking these medications     HYDROcodone-acetaminophen (NORCO) 5-325 MG per tablet           Hospital Course: Present on Admission:  .Diabetes mellitus type 2 in obese: Relatively well controlled with a hemoglobin A1c of 6.3. Continue metformin.  .Morbid obesity: Counseled on weight loss.  .Polycystic ovary disease: Stable during his hospitalization.  Marland KitchenFATTY LIVER DISEASE: During initial workup, patient noted to have elevated transaminases. These declined after a few days but still remained  persistently above normal. During her workup she was noted through CT scan and ultrasound have fatty liver. Patient will followup with gastroenterology as outpatient as referred by her primary care physician. During her cholecystectomy, general surgery performed a biopsy of her liver. Pathology at this time is pending.  .Tobacco abuse: Patient counseled to quit. She says she'll try to do so.  Marland KitchenHyponatremia: Improved with IV fluids, felt to be secondary to dehydration.  .Hypomagnesemia: Felt to be secondary to dehydration. Resolved with replacement.  Acute cholecystitis: Principal problem. Patient is a 26 year old white female past history morbid obesity and diabetes mellitus who presented with abdominal pain, mostly in the right upper quadrant as well as nausea and vomiting. Initially it was thought the patient perhaps had a viral gastroenteritis or even gastroparesis. However workup noted on the abdominal ultrasound that she had gallstones sludge. A hiatus scan was performed which noted a very boggy gallbladder with poor ejection fraction of 0%. Dr. Arnoldo Morale from general surgery was consulted who took the patient to the operating room on 01/10/2011 for cholecystectomy as well as a liver biopsy secondary to fatty liver disease. Patient had the procedure without any incident. Postop she is having some pain starting to feel better. The next morning she was doing okay but is concerned because she is feeling short of breath. Chest x-ray the head showed bilateral atelectasis. CBC and bmet were obtained which were unremarkable. Patient was given in spirometer and ambulated and by afternoon was feeling much better. O2 sats on  room air after signed ablation were 92%. In addition she was able tolerate by mouth and passing gas and she wanted to go home. She will be discharged home with by mouth immediately release OxyIR as needed for pain and she'll followup with Dr. Arnoldo Morale from general surgery in approximately 10  days.   Day of Discharge BP 135/82  Pulse 81  Temp(Src) 97.8 F (36.6 C) (Oral)  Resp 20  Ht 5' 9"  (1.753 m)  Wt 133.1 kg (293 lb 6.9 oz)  BMI 43.33 kg/m2  SpO2 87%  Physical Exam: General: Alert and oriented x3, mild distress from shortness of breath although feeling better, fatigued HEENT: Was felt, atraumatic, mucous from his her moist Cardiovascular: Regular rate and rhythm, S1-S2 Lungs: Decreased breath sounds at the bases abdomen: Soft, nontender, morbidly obese, positive bowel sounds Extremities: No clubbing or cyanosis, trace pitting edema.   Results for orders placed during the hospital encounter of 01/06/11 (from the past 24 hour(s))  GLUCOSE, CAPILLARY     Status: Normal   Collection Time   01/10/11  4:32 PM      Component Value Range   Glucose-Capillary 98  70 - 99 (mg/dL)  GLUCOSE, CAPILLARY     Status: Abnormal   Collection Time   01/11/11  5:58 AM      Component Value Range   Glucose-Capillary 107 (*) 70 - 99 (mg/dL)  GLUCOSE, CAPILLARY     Status: Normal   Collection Time   01/11/11  7:55 AM      Component Value Range   Glucose-Capillary 96  70 - 99 (mg/dL)   Comment 1 Notify RN    CBC     Status: Normal   Collection Time   01/11/11 10:28 AM      Component Value Range   WBC 8.0  4.0 - 10.5 (K/uL)   RBC 4.61  3.87 - 5.11 (MIL/uL)   Hemoglobin 13.5  12.0 - 15.0 (g/dL)   HCT 40.7  36.0 - 46.0 (%)   MCV 88.3  78.0 - 100.0 (fL)   MCH 29.3  26.0 - 34.0 (pg)   MCHC 33.2  30.0 - 36.0 (g/dL)   RDW 12.8  11.5 - 15.5 (%)   Platelets 239  150 - 400 (K/uL)  COMPREHENSIVE METABOLIC PANEL     Status: Abnormal   Collection Time   01/11/11 10:28 AM      Component Value Range   Sodium 136  135 - 145 (mEq/L)   Potassium 3.8  3.5 - 5.1 (mEq/L)   Chloride 102  96 - 112 (mEq/L)   CO2 28  19 - 32 (mEq/L)   Glucose, Bld 152 (*) 70 - 99 (mg/dL)   BUN 5 (*) 6 - 23 (mg/dL)   Creatinine, Ser 0.68  0.50 - 1.10 (mg/dL)   Calcium 9.3  8.4 - 10.5 (mg/dL)   Total Protein 6.4  6.0  - 8.3 (g/dL)   Albumin 3.0 (*) 3.5 - 5.2 (g/dL)   AST 79 (*) 0 - 37 (U/L)   ALT 111 (*) 0 - 35 (U/L)   Alkaline Phosphatase 57  39 - 117 (U/L)   Total Bilirubin 0.4  0.3 - 1.2 (mg/dL)   GFR calc non Af Amer >90  >90 (mL/min)   GFR calc Af Amer >90  >90 (mL/min)  GLUCOSE, CAPILLARY     Status: Abnormal   Collection Time   01/11/11 11:23 AM      Component Value Range   Glucose-Capillary 108 (*)  70 - 99 (mg/dL)   Comment 1 Notify RN      Disposition: Improved, being discharged home   Follow-up Appts: Discharge Orders    Future Orders Please Complete By Expires   Diet - low sodium heart healthy      Diet Carb Modified      Increase activity slowly         Follow-up with Dr. Arnoldo Morale, general surgery in 10 days. Patient will followup with her primary care physician, Dr. Nevada Crane in about 4 weeks.   Tests Needing Follow-up: Liver biopsy pathology  Time spent in discharge (includes decision making & examination of pt): 45  Signed: Daquavion Catala K 01/11/2011, 2:30 PM

## 2011-01-11 NOTE — Progress Notes (Signed)
Pt complained of headache at 1930, going on since surgery , I medicated her with 1 oxycodone at 1950. Pt was asleep shortly thereafter. Pt explained that pain is worsened w/activity, and she feels the dilaudid and morphine make it worse, and she would rather not take either of those, as they cause a headache and nausea at times.Cheryl Black

## 2011-01-12 NOTE — Addendum Note (Signed)
Addendum  created 01/12/11 1249 by Glynn Octave   Modules edited:Notes Section

## 2011-01-13 LAB — GLUCOSE, CAPILLARY: Glucose-Capillary: 113 mg/dL — ABNORMAL HIGH (ref 70–99)

## 2011-01-15 ENCOUNTER — Encounter (HOSPITAL_COMMUNITY): Payer: Self-pay | Admitting: General Surgery

## 2011-12-28 ENCOUNTER — Encounter (HOSPITAL_COMMUNITY): Payer: Self-pay | Admitting: Emergency Medicine

## 2011-12-28 ENCOUNTER — Emergency Department (HOSPITAL_COMMUNITY): Payer: 59

## 2011-12-28 ENCOUNTER — Emergency Department (HOSPITAL_COMMUNITY)
Admission: EM | Admit: 2011-12-28 | Discharge: 2011-12-28 | Disposition: A | Discharge status: Home / Self Care | Payer: 59 | Attending: Emergency Medicine | Admitting: Emergency Medicine

## 2011-12-28 DIAGNOSIS — R059 Cough, unspecified: Secondary | ICD-10-CM | POA: Insufficient documentation

## 2011-12-28 DIAGNOSIS — R1110 Vomiting, unspecified: Secondary | ICD-10-CM | POA: Insufficient documentation

## 2011-12-28 DIAGNOSIS — Z87891 Personal history of nicotine dependence: Secondary | ICD-10-CM | POA: Insufficient documentation

## 2011-12-28 DIAGNOSIS — E119 Type 2 diabetes mellitus without complications: Secondary | ICD-10-CM | POA: Insufficient documentation

## 2011-12-28 DIAGNOSIS — J029 Acute pharyngitis, unspecified: Secondary | ICD-10-CM | POA: Insufficient documentation

## 2011-12-28 DIAGNOSIS — Z8719 Personal history of other diseases of the digestive system: Secondary | ICD-10-CM | POA: Insufficient documentation

## 2011-12-28 DIAGNOSIS — J209 Acute bronchitis, unspecified: Secondary | ICD-10-CM | POA: Insufficient documentation

## 2011-12-28 DIAGNOSIS — R0789 Other chest pain: Secondary | ICD-10-CM | POA: Insufficient documentation

## 2011-12-28 DIAGNOSIS — R05 Cough: Secondary | ICD-10-CM | POA: Insufficient documentation

## 2011-12-28 DIAGNOSIS — K219 Gastro-esophageal reflux disease without esophagitis: Secondary | ICD-10-CM | POA: Insufficient documentation

## 2011-12-28 DIAGNOSIS — IMO0001 Reserved for inherently not codable concepts without codable children: Secondary | ICD-10-CM | POA: Insufficient documentation

## 2011-12-28 DIAGNOSIS — R52 Pain, unspecified: Secondary | ICD-10-CM | POA: Insufficient documentation

## 2011-12-28 DIAGNOSIS — Z8742 Personal history of other diseases of the female genital tract: Secondary | ICD-10-CM | POA: Insufficient documentation

## 2011-12-28 DIAGNOSIS — M109 Gout, unspecified: Secondary | ICD-10-CM | POA: Insufficient documentation

## 2011-12-28 DIAGNOSIS — Z79899 Other long term (current) drug therapy: Secondary | ICD-10-CM | POA: Insufficient documentation

## 2011-12-28 LAB — CBC WITH DIFFERENTIAL/PLATELET
Basophils Absolute: 0 10*3/uL (ref 0.0–0.1)
Basophils Relative: 0 % (ref 0–1)
Lymphocytes Relative: 8 % — ABNORMAL LOW (ref 12–46)
MCHC: 34.4 g/dL (ref 30.0–36.0)
Neutro Abs: 6.5 10*3/uL (ref 1.7–7.7)
Neutrophils Relative %: 82 % — ABNORMAL HIGH (ref 43–77)
RDW: 12.7 % (ref 11.5–15.5)
WBC: 7.9 10*3/uL (ref 4.0–10.5)

## 2011-12-28 LAB — BASIC METABOLIC PANEL
Chloride: 103 mEq/L (ref 96–112)
Creatinine, Ser: 0.68 mg/dL (ref 0.50–1.10)
GFR calc Af Amer: >90 mL/min (ref >90)
Potassium: 3.8 mEq/L (ref 3.5–5.1)
Sodium: 136 mEq/L (ref 135–145)

## 2011-12-28 MED ORDER — ONDANSETRON HCL 4 MG/2ML IJ SOLN
INTRAMUSCULAR | Status: AC
  Administered 2011-12-28: 4 mg
  Filled 2011-12-28: qty 2

## 2011-12-28 MED ORDER — HYDROCOD POLST-CHLORPHEN POLST 10-8 MG/5ML PO LQCR: 5.0000 mL | Freq: Two times a day (BID) | ORAL | Status: DC | PRN

## 2011-12-28 MED ORDER — SODIUM CHLORIDE 0.9 % IV SOLN
INTRAVENOUS | Status: DC
  Administered 2011-12-28: 16:00:00 via INTRAVENOUS

## 2011-12-28 MED ORDER — AZITHROMYCIN 250 MG PO TABS: ORAL_TABLET | ORAL | Status: DC

## 2011-12-28 MED ORDER — ACETAMINOPHEN 500 MG PO TABS
1000.0000 mg | ORAL_TABLET | Freq: Once | ORAL | Status: AC
  Administered 2011-12-28: 1000 mg via ORAL
  Filled 2011-12-28: qty 2

## 2011-12-28 MED ORDER — HYDROCOD POLST-CHLORPHEN POLST 10-8 MG/5ML PO LQCR
10.0000 mL | Freq: Once | ORAL | Status: AC
  Administered 2011-12-28: 10 mL via ORAL
  Filled 2011-12-28 (×2): qty 5

## 2011-12-28 NOTE — ED Notes (Signed)
Pt back from xray.  nad noted

## 2011-12-28 NOTE — ED Notes (Signed)
Pt actively vomitting

## 2011-12-28 NOTE — ED Provider Notes (Signed)
History   This chart was scribed for Carleene Cooper III, MD by Leone Payor, ED Scribe. This patient was seen in room [facility] and the patient's care was started at 1509.   CSN: [id number]  Arrival date & time 12/28/11  1411   First MD Initiated Contact with Patient 12/28/11 [phone]      Chief Complaint  Patient presents with  . Fever  . Emesis  . Cough     The history is provided by the patient. No language interpreter was used.    Cheryl Black is a 26 y.o. female who presents to the Emergency Department complaining of sore throat with associated fever, vomiting, body aches and cough productive of brown sputum starting 1 day ago. Pt reports having chest tightness starting this morning. She feels slightly lethargic per nursing note. Pt reports taking tylenol and motrin at home with mild relief. Last tylenol was at 6:30AM and last motrin was 12:30AM. Pt denies any diarrhea or back pain.    Pt has h/o DM, acid reflux, polycystic ovary syndrome, cholecystectomy, liver biopsy.  Pt is allergic to flu shot and Diovan.  Pt is a former smoker and occasional alcohol user.  Past Medical History  Diagnosis Date  . Fibrocystic breast disease   . Acid reflux   . Gout   . Polycystic ovary syndrome   . Fatty liver   . Diabetes mellitus     Past Surgical History  Procedure Date  . Cholecystectomy 01/10/2011    Procedure: LAPAROSCOPIC CHOLECYSTECTOMY;  Surgeon: Dalia Heading;  Location: [facility];  Service: General;  Laterality: N/A;  . Liver biopsy 01/10/2011    Procedure: LIVER BIOPSY;  Surgeon: Dalia Heading;  Location: [facility];  Service: General;;    Family History  Problem Relation Age of Onset  . Diabetes Father   . Hypertension Father   . Stroke Other     History  Substance Use Topics  . Smoking status: Former Smoker -- 0.5 packs/day    Types: Cigarettes  . Smokeless tobacco: Former Neurosurgeon    Quit date: 06/12/2011  . Alcohol Use: Yes     Comment: occ    No OB history  provided.   Review of Systems  Constitutional: Positive for fever.  HENT: Positive for sore throat.   Respiratory: Positive for cough (productive of brown sputum) and chest tightness.   Cardiovascular: Negative.   Gastrointestinal: Positive for vomiting. Negative for diarrhea.  Musculoskeletal: Positive for myalgias (body aches). Negative for back pain.  Skin: Negative.   Neurological: Negative.   Hematological: Negative.   Psychiatric/Behavioral: Negative.     Allergies  Diovan and Influenza vaccine live  Home Medications   Current Outpatient Rx  Name  Route  Sig  Dispense  Refill  . ALLOPURINOL 300 MG PO TABS   Oral   Take 300 mg by mouth daily.         Marland Kitchen LANSOPRAZOLE 15 MG PO CPDR   Oral   Take 15 mg by mouth daily.         Marland Kitchen LINAGLIPTIN-METFORMIN HCL 2.5-500 MG PO TABS   Oral   Take 1 tablet by mouth daily.         . ADULT MULTIVITAMIN W/MINERALS CH   Oral   Take 2 tablets by mouth daily.         Marland Kitchen ONDANSETRON HCL 8 MG PO TABS   Oral   Take by mouth every 8 (eight) hours as needed. For nausea          .  PRAVASTATIN SODIUM 20 MG PO TABS   Oral   Take 20 mg by mouth daily.         Marland Kitchen VALSARTAN-HYDROCHLOROTHIAZIDE 160-25 MG PO TABS   Oral   Take 1 tablet by mouth daily.           BP 145/80  Pulse 125  Temp 101.9 F (38.8 C) (Oral)  Resp 24  Ht 5\' 8"  (1.727 m)  Wt 304 lb (137.893 kg)  BMI 46.22 kg/m2  SpO2 95%  Physical Exam  Nursing note and vitals reviewed. Constitutional: She appears well-developed and well-nourished.       Morbidly obese  HENT:  Head: Normocephalic and atraumatic.       Throat is normal.   Eyes: Conjunctivae normal are normal. Pupils are equal, round, and reactive to light.       Eyes are normal.   Neck: Neck supple. No tracheal deviation present. No thyromegaly present.  Cardiovascular: Normal rate and regular rhythm.   No murmur heard. Pulmonary/Chest: Effort normal and breath sounds normal.       Lungs  are normal  Abdominal: Soft. Bowel sounds are normal. She exhibits no distension. There is no tenderness.  Musculoskeletal: Normal range of motion. She exhibits no edema and no tenderness.       No calf tenderness  Lymphadenopathy:    She has no cervical adenopathy.  Neurological: She is alert. Coordination normal.       Neurologically intact.   Skin: Skin is warm and dry. No rash noted.  Psychiatric: She has a normal mood and affect.    ED Course  Procedures (including critical care time)  DIAGNOSTIC STUDIES: Oxygen Saturation is 95% on room air, adequate by my interpretation.    COORDINATION OF CARE:   3:21 PM Discussed treatment plan which includes lab work and fluids with pt at bedside and pt agreed to plan.  4:36 PM Advised pt of radiology and lab work results. Pt states that her cough is better with the cough medication that was administered. Discussed discharge plan which includes cough medication with pt and pt agreed to plan. Also advised pt to follow up and pt agreed.          Labs Reviewed  CBC WITH DIFFERENTIAL - Abnormal; Notable for the following:    Neutrophils Relative 82 (*)     Lymphocytes Relative 8 (*)     Lymphs Abs 0.6 (*)     All other components within normal limits  BASIC METABOLIC PANEL - Abnormal; Notable for the following:    Glucose, Bld 129 (*)     BUN 5 (*)     All other components within normal limits   Dg Chest 2 View  12/28/2011  *RADIOLOGY REPORT*  Clinical Data: Fever, productive cough, nausea, vomiting, fever, lethargy, former smoker  CHEST - 2 VIEW  Comparison: 01/11/2011  Findings: Normal heart size, mediastinal contours, and pulmonary vascularity. New areas of subsegmental atelectasis in the mid lungs bilaterally and at left base. Mild bronchitic changes. No infiltrate, pleural effusion or pneumothorax. Bones unremarkable.  IMPRESSION: Bronchitic changes with scattered atelectasis bilaterally.   Original Report Authenticated By:  Ulyses Southward, M.D.    Rx with Z-Pak and Tussionex.  No work for 3 days.  1. Acute bronchitis     I personally performed the services described in this documentation, which was scribed in my presence. The recorded information has been reviewed and is accurate.  Osvaldo Human, MD  Carleene Cooper III, MD 12/28/11 [phone]

## 2011-12-28 NOTE — ED Notes (Signed)
Pt c/o fever/vomiting/prod cough with brown sputum. Body aches. Chest tightness started this am "but I coughed all night last night". Pt slightly lethargic. Last tylenol 630am. Last motrin 1230

## 2012-01-31 ENCOUNTER — Encounter (HOSPITAL_COMMUNITY): Payer: Self-pay

## 2012-01-31 ENCOUNTER — Inpatient Hospital Stay (HOSPITAL_COMMUNITY)
Admission: EM | Admit: 2012-01-31 | Discharge: 2012-02-05 | DRG: 372 | Disposition: A | Discharge status: Home / Self Care | Payer: 59 | Attending: Internal Medicine | Admitting: Internal Medicine

## 2012-01-31 ENCOUNTER — Emergency Department (HOSPITAL_COMMUNITY): Payer: 59

## 2012-01-31 DIAGNOSIS — M109 Gout, unspecified: Secondary | ICD-10-CM | POA: Diagnosis present

## 2012-01-31 DIAGNOSIS — K7689 Other specified diseases of liver: Secondary | ICD-10-CM | POA: Diagnosis present

## 2012-01-31 DIAGNOSIS — K219 Gastro-esophageal reflux disease without esophagitis: Secondary | ICD-10-CM | POA: Diagnosis present

## 2012-01-31 DIAGNOSIS — Z720 Tobacco use: Secondary | ICD-10-CM | POA: Diagnosis present

## 2012-01-31 DIAGNOSIS — Z79899 Other long term (current) drug therapy: Secondary | ICD-10-CM

## 2012-01-31 DIAGNOSIS — E876 Hypokalemia: Secondary | ICD-10-CM | POA: Diagnosis present

## 2012-01-31 DIAGNOSIS — I808 Phlebitis and thrombophlebitis of other sites: Secondary | ICD-10-CM | POA: Diagnosis not present

## 2012-01-31 DIAGNOSIS — Z6841 Body Mass Index (BMI) 40.0 and over, adult: Secondary | ICD-10-CM

## 2012-01-31 DIAGNOSIS — E282 Polycystic ovarian syndrome: Secondary | ICD-10-CM | POA: Diagnosis present

## 2012-01-31 DIAGNOSIS — E119 Type 2 diabetes mellitus without complications: Secondary | ICD-10-CM | POA: Diagnosis present

## 2012-01-31 DIAGNOSIS — K7581 Nonalcoholic steatohepatitis (NASH): Secondary | ICD-10-CM | POA: Diagnosis present

## 2012-01-31 DIAGNOSIS — E1169 Type 2 diabetes mellitus with other specified complication: Secondary | ICD-10-CM

## 2012-01-31 DIAGNOSIS — R7989 Other specified abnormal findings of blood chemistry: Secondary | ICD-10-CM | POA: Diagnosis present

## 2012-01-31 DIAGNOSIS — R109 Unspecified abdominal pain: Secondary | ICD-10-CM

## 2012-01-31 DIAGNOSIS — A0472 Enterocolitis due to Clostridium difficile, not specified as recurrent: Principal | ICD-10-CM | POA: Diagnosis present

## 2012-01-31 DIAGNOSIS — Z87891 Personal history of nicotine dependence: Secondary | ICD-10-CM

## 2012-01-31 DIAGNOSIS — Z87442 Personal history of urinary calculi: Secondary | ICD-10-CM

## 2012-01-31 DIAGNOSIS — Y849 Medical procedure, unspecified as the cause of abnormal reaction of the patient, or of later complication, without mention of misadventure at the time of the procedure: Secondary | ICD-10-CM | POA: Diagnosis not present

## 2012-01-31 DIAGNOSIS — E669 Obesity, unspecified: Secondary | ICD-10-CM

## 2012-01-31 LAB — CBC WITH DIFFERENTIAL/PLATELET
Basophils Absolute: 0 10*3/uL (ref 0.0–0.1)
HCT: 48.6 % — ABNORMAL HIGH (ref 36.0–46.0)
Hemoglobin: 17.1 g/dL — ABNORMAL HIGH (ref 12.0–15.0)
Lymphocytes Relative: 7 % — ABNORMAL LOW (ref 12–46)
Lymphs Abs: 1.1 10*3/uL (ref 0.7–4.0)
Monocytes Absolute: 0.6 10*3/uL (ref 0.1–1.0)
Neutro Abs: 14.8 10*3/uL — ABNORMAL HIGH (ref 1.7–7.7)
RBC: 5.58 MIL/uL — ABNORMAL HIGH (ref 3.87–5.11)
RDW: 13.1 % (ref 11.5–15.5)
WBC: 16.6 10*3/uL — ABNORMAL HIGH (ref 4.0–10.5)

## 2012-01-31 LAB — URINE MICROSCOPIC-ADD ON

## 2012-01-31 LAB — URINALYSIS, ROUTINE W REFLEX MICROSCOPIC
Bilirubin Urine: NEGATIVE
Specific Gravity, Urine: <1.005 — ABNORMAL LOW (ref 1.005–1.030)
pH: 7 (ref 5.0–8.0)

## 2012-01-31 LAB — HEPATIC FUNCTION PANEL
ALT: 92 U/L — ABNORMAL HIGH (ref 0–35)
Albumin: 3.9 g/dL (ref 3.5–5.2)
Alkaline Phosphatase: 70 U/L (ref 39–117)
Indirect Bilirubin: 0.6 mg/dL (ref 0.3–0.9)
Total Protein: 7.7 g/dL (ref 6.0–8.3)

## 2012-01-31 LAB — BASIC METABOLIC PANEL
CO2: 19 mEq/L (ref 19–32)
Chloride: 102 mEq/L (ref 96–112)
Creatinine, Ser: 0.7 mg/dL (ref 0.50–1.10)

## 2012-01-31 MED ORDER — SODIUM CHLORIDE 0.9 % IV SOLN
1000.0000 mL | INTRAVENOUS | Status: DC
  Administered 2012-01-31: 1000 mL via INTRAVENOUS

## 2012-01-31 MED ORDER — LORAZEPAM 2 MG/ML IJ SOLN
INTRAMUSCULAR | Status: AC
  Administered 2012-01-31: 1 mg via INTRAVENOUS
  Filled 2012-01-31: qty 1

## 2012-01-31 MED ORDER — PROMETHAZINE HCL 25 MG/ML IJ SOLN
12.5000 mg | Freq: Once | INTRAMUSCULAR | Status: AC
  Administered 2012-01-31: 12.5 mg via INTRAVENOUS
  Filled 2012-01-31: qty 1

## 2012-01-31 MED ORDER — SODIUM CHLORIDE 0.9 % IJ SOLN
3.0000 mL | Freq: Two times a day (BID) | INTRAMUSCULAR | Status: DC
  Administered 2012-01-31: 3 mL via INTRAVENOUS
  Administered 2012-02-01: 10 mL via INTRAVENOUS
  Administered 2012-02-02 – 2012-02-04 (×2): 3 mL via INTRAVENOUS

## 2012-01-31 MED ORDER — PROMETHAZINE HCL 25 MG/ML IJ SOLN
12.5000 mg | Freq: Once | INTRAMUSCULAR | Status: AC
  Administered 2012-01-31: 12.5 mg via INTRAVENOUS

## 2012-01-31 MED ORDER — ACETAMINOPHEN 325 MG PO TABS
ORAL_TABLET | ORAL | Status: AC
  Administered 2012-01-31: 16:00:00
  Filled 2012-01-31: qty 2

## 2012-01-31 MED ORDER — PROMETHAZINE HCL 25 MG/ML IJ SOLN
INTRAMUSCULAR | Status: AC
  Filled 2012-01-31: qty 1

## 2012-01-31 MED ORDER — ALLOPURINOL 300 MG PO TABS
300.0000 mg | ORAL_TABLET | Freq: Every day | ORAL | Status: DC
  Administered 2012-01-31 – 2012-02-05 (×6): 300 mg via ORAL
  Filled 2012-01-31 (×6): qty 1

## 2012-01-31 MED ORDER — PROMETHAZINE HCL 25 MG/ML IJ SOLN
12.5000 mg | Freq: Four times a day (QID) | INTRAMUSCULAR | Status: DC | PRN
  Administered 2012-02-01: 12.5 mg via INTRAVENOUS
  Administered 2012-02-02: 25 mg via INTRAVENOUS
  Filled 2012-01-31 (×2): qty 1

## 2012-01-31 MED ORDER — SODIUM CHLORIDE 0.9 % IV SOLN
Freq: Once | INTRAVENOUS | Status: AC
  Administered 2012-01-31: 09:00:00 via INTRAVENOUS

## 2012-01-31 MED ORDER — ONDANSETRON HCL 4 MG/2ML IJ SOLN
4.0000 mg | Freq: Four times a day (QID) | INTRAMUSCULAR | Status: DC | PRN
  Administered 2012-02-01 – 2012-02-02 (×2): 4 mg via INTRAVENOUS
  Filled 2012-01-31 (×2): qty 2

## 2012-01-31 MED ORDER — ONDANSETRON HCL 4 MG/2ML IJ SOLN
INTRAMUSCULAR | Status: AC
  Filled 2012-01-31: qty 2

## 2012-01-31 MED ORDER — ONDANSETRON HCL 4 MG PO TABS: 4.0000 mg | ORAL_TABLET | Freq: Four times a day (QID) | ORAL | Status: DC | PRN

## 2012-01-31 MED ORDER — HYDROMORPHONE HCL PF 1 MG/ML IJ SOLN
0.5000 mg | Freq: Once | INTRAMUSCULAR | Status: AC
  Administered 2012-01-31: 0.5 mg via INTRAVENOUS
  Filled 2012-01-31: qty 1

## 2012-01-31 MED ORDER — SODIUM CHLORIDE 0.9 % IV SOLN
1000.0000 mL | INTRAVENOUS | Status: DC
  Administered 2012-01-31 – 2012-02-02 (×4): 1000 mL via INTRAVENOUS

## 2012-01-31 MED ORDER — SODIUM CHLORIDE 0.9 % IV BOLUS (SEPSIS)
1000.0000 mL | Freq: Once | INTRAVENOUS | Status: AC
  Administered 2012-01-31: 1000 mL via INTRAVENOUS

## 2012-01-31 MED ORDER — LORAZEPAM 2 MG/ML IJ SOLN
1.0000 mg | Freq: Once | INTRAMUSCULAR | Status: AC
  Administered 2012-01-31: 1 mg via INTRAVENOUS

## 2012-01-31 MED ORDER — IOHEXOL 300 MG/ML  SOLN
100.0000 mL | Freq: Once | INTRAMUSCULAR | Status: AC | PRN
  Administered 2012-01-31: 100 mL via INTRAVENOUS

## 2012-01-31 MED ORDER — METOCLOPRAMIDE HCL 5 MG/ML IJ SOLN
10.0000 mg | Freq: Once | INTRAMUSCULAR | Status: AC
  Administered 2012-01-31: 10 mg via INTRAVENOUS
  Filled 2012-01-31: qty 2

## 2012-01-31 MED ORDER — ONDANSETRON HCL 4 MG/2ML IJ SOLN
4.0000 mg | Freq: Once | INTRAMUSCULAR | Status: AC
  Administered 2012-01-31: 4 mg via INTRAVENOUS

## 2012-01-31 MED ORDER — IOHEXOL 300 MG/ML  SOLN
50.0000 mL | Freq: Once | INTRAMUSCULAR | Status: AC | PRN
  Administered 2012-01-31: 50 mL via ORAL

## 2012-01-31 MED ORDER — HEPARIN SODIUM (PORCINE) 5000 UNIT/ML IJ SOLN
5000.0000 [IU] | Freq: Three times a day (TID) | INTRAMUSCULAR | Status: DC
  Administered 2012-01-31 – 2012-02-05 (×14): 5000 [IU] via SUBCUTANEOUS
  Filled 2012-01-31 (×14): qty 1

## 2012-01-31 NOTE — ED Notes (Addendum)
Patient states that she started having vomiting this morning around 400 am with abdominal pain, states the pain started in the epigastric region and slowly progressed inferiorly to her lower abdomen, now her entire abdomen is painful.  States last normal BM was yesterday.  States that she attempted to take zofran at home, but was unable to keep it down.

## 2012-01-31 NOTE — ED Notes (Signed)
Patient states she does not need anything at this time. 

## 2012-01-31 NOTE — ED Provider Notes (Signed)
Medical screening examination/treatment/procedure(s) were performed by non-physician practitioner and as supervising physician I was immediately available for consultation/collaboration.  Virgel Manifold, MD 01/31/12 [phone]

## 2012-01-31 NOTE — ED Notes (Signed)
Unable to provide urine sample at this moment.

## 2012-01-31 NOTE — ED Notes (Signed)
Pt reports generalized abd pain with n/v.  Denies diarrhea.  LBM  Was today but was normal.

## 2012-01-31 NOTE — ED Notes (Signed)
RN at bedside

## 2012-01-31 NOTE — ED Notes (Signed)
Unable to tolerate PO contrast, PA notified.

## 2012-01-31 NOTE — ED Notes (Signed)
Pt does reports some improvement in nausea

## 2012-01-31 NOTE — ED Provider Notes (Signed)
History     CSN: [id number]  Arrival date & time 01/31/12  4098   First MD Initiated Contact with Patient 01/31/12 [phone]      Chief Complaint  Patient presents with  . Emesis  . Abdominal Pain    (Consider location/radiation/quality/duration/timing/severity/associated sxs/prior treatment) Patient is a 27 y.o. female presenting with vomiting and abdominal pain. The history is provided by the patient.  Emesis  This is a new problem. The current episode started 3 to 5 hours ago. The problem occurs continuously. The problem has not changed since onset.The emesis has an appearance of stomach contents. There has been no fever. Associated symptoms include abdominal pain and arthralgias. Pertinent negatives include no cough, no diarrhea, no fever, no myalgias, no sweats and no URI.  Abdominal Pain The primary symptoms of the illness include abdominal pain, nausea and vomiting. The primary symptoms of the illness do not include fever, shortness of breath, diarrhea or dysuria.  Symptoms associated with the illness do not include hematuria, frequency or back pain. Significant associated medical issues include GERD and diabetes. Associated medical issues comments: diabetes.    Past Medical History  Diagnosis Date  . Fibrocystic breast disease   . Acid reflux   . Gout   . Polycystic ovary syndrome   . Fatty liver   . Diabetes mellitus   . Renal disorder     kidney stones    Past Surgical History  Procedure Date  . Cholecystectomy 01/10/2011    Procedure: LAPAROSCOPIC CHOLECYSTECTOMY;  Surgeon: Dalia Heading;  Location: [facility];  Service: General;  Laterality: N/A;  . Liver biopsy 01/10/2011    Procedure: LIVER BIOPSY;  Surgeon: Dalia Heading;  Location: [facility];  Service: General;;    Family History  Problem Relation Age of Onset  . Diabetes Father   . Hypertension Father   . Stroke Other     History  Substance Use Topics  . Smoking status: Former Smoker -- 0.5 packs/day    Types:  Cigarettes  . Smokeless tobacco: Former Neurosurgeon    Quit date: 06/12/2011  . Alcohol Use: Yes     Comment: occ    OB History    Grav Para Term Preterm Abortions TAB SAB Ect Mult Living                  Review of Systems  Constitutional: Negative for fever and activity change.       All ROS Neg except as noted in HPI  HENT: Negative for nosebleeds and neck pain.   Eyes: Negative for photophobia and discharge.  Respiratory: Negative for cough, shortness of breath and wheezing.   Cardiovascular: Negative for chest pain and palpitations.  Gastrointestinal: Positive for nausea, vomiting and abdominal pain. Negative for diarrhea and blood in stool.  Genitourinary: Negative for dysuria, frequency and hematuria.  Musculoskeletal: Positive for arthralgias. Negative for myalgias and back pain.  Skin: Negative.   Neurological: Negative for dizziness, seizures and speech difficulty.  Psychiatric/Behavioral: Negative for hallucinations and confusion.    Allergies  Diovan and Influenza vaccine live  Home Medications   Current Outpatient Rx  Name  Route  Sig  Dispense  Refill  . ALLOPURINOL 300 MG PO TABS   Oral   Take 300 mg by mouth daily.         . AZITHROMYCIN 250 MG PO TABS      Take 2 tablets today, then 1 tablet per day for the next 4 days.  6 tablet   0   . HYDROCOD POLST-CPM POLST ER 10-8 MG/5ML PO LQCR   Oral   Take 5 mLs by mouth every 12 (twelve) hours as needed.   60 mL   0   . LANSOPRAZOLE 15 MG PO CPDR   Oral   Take 15 mg by mouth daily.         Marland Kitchen LINAGLIPTIN-METFORMIN HCL 2.5-500 MG PO TABS   Oral   Take 1 tablet by mouth daily.         . ADULT MULTIVITAMIN W/MINERALS CH   Oral   Take 2 tablets by mouth daily.         Marland Kitchen ONDANSETRON HCL 8 MG PO TABS   Oral   Take by mouth every 8 (eight) hours as needed. For nausea          . PRAVASTATIN SODIUM 20 MG PO TABS   Oral   Take 20 mg by mouth daily.         Marland Kitchen VALSARTAN-HYDROCHLOROTHIAZIDE  160-25 MG PO TABS   Oral   Take 1 tablet by mouth daily.           BP 118/66  Pulse 115  Temp 97.8 F (36.6 C) (Oral)  Resp 22  Ht 5\' 9"  (1.753 m)  Wt 303 lb (137.44 kg)  BMI 44.75 kg/m2  SpO2 96%  Physical Exam  Nursing note and vitals reviewed. Constitutional: She is oriented to person, place, and time. She appears well-developed and well-nourished.  Non-toxic appearance. She appears ill.  HENT:  Head: Normocephalic.  Right Ear: Tympanic membrane and external ear normal.  Left Ear: Tympanic membrane and external ear normal.  Eyes: EOM and lids are normal. Pupils are equal, round, and reactive to light.  Neck: Normal range of motion. Neck supple. Carotid bruit is not present.  Cardiovascular: Regular rhythm, normal heart sounds, intact distal pulses and normal pulses.  Tachycardia present.   Pulmonary/Chest: Breath sounds normal. No respiratory distress.  Abdominal: Soft. Bowel sounds are normal. There is no tenderness. There is no guarding.       Diffuse soreness present. Epigastric and RUQ pain to palpation.  Musculoskeletal: Normal range of motion.  Lymphadenopathy:       Head (right side): No submandibular adenopathy present.       Head (left side): No submandibular adenopathy present.    She has no cervical adenopathy.  Neurological: She is alert and oriented to person, place, and time. She has normal strength. No cranial nerve deficit or sensory deficit.  Skin: Skin is warm and dry.  Psychiatric: She has a normal mood and affect. Her speech is normal.    ED Course  Procedures (including critical care time)   Labs Reviewed  CBC WITH DIFFERENTIAL  BASIC METABOLIC PANEL  HEPATIC FUNCTION PANEL  LIPASE, BLOOD  URINALYSIS, ROUTINE W REFLEX MICROSCOPIC   No results found. Pulse ox 98% on room air within normal limits by my interpretation.  No diagnosis found.    MDM  I have reviewed nursing notes, vital signs, and all appropriate lab and imaging results  for this patient. Patient presents to the emergency department with generalized abdomen pain nausea and vomiting since approximately 4 AM today. The patient has now" dry heaves" upon arrival in the emergency department. IV fluids and IV Zofran were given but were unsuccessful in controlling the nausea and dry heaving. The patient was given IV promethazine.  The promethazine was effective for a short time until the  patient began to take in oral contrasts. She then began to have nausea vomiting began. This was stopped. The patient was given Ativan by Dr. Anitra Lauth, and later Reglan by me. The patient underwent CT scan with IV contrast only. No acute findings were noted. The patient continued to have some nausea and resume some dry heaves approximately one hour after finishing the CT study. Promethazine was again given. Patient received IV fluids during the entire emergency department stay. The patient was observed, and given a fluid challenge with sprite. The patient was able to keep a small amount of the sprite down but when she got up to go to the bathroom she became dizzy, lightheaded, tachycardic, and the temperature was noted to be elevated. The patient was reassessed by Dr. Anitra Lauth. It was the opinion that she had failed emergency department management, and should be admitted.   Case discussed with hospital ist, Dr Mahala Menghini. He request pt have blood cultures, he will see pt for admission.  Kathie Dike, [place] 01/31/12 [phone]

## 2012-01-31 NOTE — H&P (Signed)
[facility] History and Physical  Cheryl Black MBW:[id number] DOB: [birth year] DOA: 01/31/2012  Referring physician: Marshell Levan, [place]  PCP: Wende Neighbors, MD  Specialists: None  Chief Complaint: Abdominal pain, N, V  HPI: Cheryl Black is a 27 y.o. female who presented to [facility] 01/31/12 with above c/o.  SHe states she awke about 04:00 and staretd having epiosdesof vomtiing-she developed soft BM's this am and then proceeded to have diarrhea-didn;t eat anywhere out of the ordinary-she did eat a salad and a chien sandwich on going to [employer] who ate soething simlar didn't have any symptoms of this however. She states that she last worked [employer] she may have vomited 10-12 times.  The stools she had were brown initally and then turned yellow.  THe last stool was watery and yellow.  She has had pain in RUQ as well =as epigastric and awoke her.  Hasn't eaten anything since all this happened. She states she has lost close to 20lbs severe an effort to control her rate and fatty liver SHe has had symptoms similar to this in Nov 2013 seent he tueasday priro to xmas  Bronchitis and the flu    Emergency room workup revealed specific gravity less than 1.005 many scones epithelial cells negative pregnancy test Sodium 134 glucose 162 AST ALT ratio 55/92 WBC 16.6 hemoglobin 17.1 neutrophilia of 89% CT scan was performed with contrast showing no acute infiltrate or process in abdomen or pelvis, no hydronephrosis or hydroureter no pericecal inflammation normal appendix with mildly infiltrated fatty liver  Review of Systems: The patient denies dyuria, Has been exposed o sick people to her job, but no one really sick.-last time she wroked was Thursday night to friay and.  No prior fevers, no cough no cold, no ras.  Has a dog at home  Past Medical History  Diagnosis Date  . Fibrocystic breast disease   . Acid reflux   . Gout   . Polycystic ovary syndrome   . Fatty liver   . Diabetes mellitus    . Renal disorder     kidney stones   Chart review  Admission 01/06/11 with Acute Cholecystitis  Seen by GI for Elevated LFt's, first in 06/08/2008  Past Surgical History  Procedure Date  . Cholecystectomy 01/10/2011    Procedure: LAPAROSCOPIC CHOLECYSTECTOMY;  Surgeon: Jamesetta So;  Location: [facility];  Service: General;  Laterality: N/A;  . Liver biopsy 01/10/2011    Procedure: LIVER BIOPSY;  Surgeon: Jamesetta So;  Location: [facility];  Service: General;;   Social History:  reports that she has quit smoking. Her smoking use included Cigarettes. She smoked .5 packs per day. She quit smokeless tobacco use about 7 months ago. She reports that she drinks alcohol. She reports that she does not use illicit drugs.   Allergies  Allergen Reactions  . Diovan (Valsartan) Swelling    Nose   . Influenza Vaccine Live Swelling    Arm     Family History  Problem Relation Age of Onset  . Diabetes Father   . Hypertension Father   . Stroke Other     Prior to Admission medications   Medication Sig Start Date End Date Taking? Authorizing Provider  allopurinol (ZYLOPRIM) 300 MG tablet Take 300 mg by mouth daily.   Yes Historical Provider, MD  Linagliptin-Metformin HCl (JENTADUETO) 2.5-500 MG TABS Take 1 tablet by mouth daily.   Yes Historical Provider, MD  Multiple Vitamin (MULTIVITAMIN WITH MINERALS) TABS Take 2 tablets  by mouth daily.   Yes Historical Provider, MD  omeprazole (PRILOSEC) 20 MG capsule Take 20 mg by mouth daily.   Yes Historical Provider, MD  ondansetron (ZOFRAN) 8 MG tablet Take 8 mg by mouth every 8 (eight) hours as needed. For nausea   Yes Historical Provider, MD  pravastatin (PRAVACHOL) 20 MG tablet Take 20 mg by mouth daily.   Yes Historical Provider, MD  valsartan-hydrochlorothiazide (DIOVAN-HCT) 160-25 MG per tablet Take 1 tablet by mouth daily.   Yes Historical Provider, MD   Physical Exam: Filed Vitals:   01/31/12 0858 01/31/12 1058 01/31/12 1519  BP: 118/66 118/82  100/64  Pulse: 115 115 123  Temp: 97.8 F (36.6 C)    TempSrc: Oral    Resp: 22 18 18   Height: 5' 9"  (1.753 m)    Weight: 137.44 kg (303 lb)    SpO2: 96% 95% 100%     General:  Alert but tired looking pleasant Caucasian female no apparent distress, hirsute  Eyes: No pallor or icterus extraocular movements intact  ENT: Dry mucous membranes thick neck  Neck: Soft supple hirsutism noted  Cardiovascular: S1-S2 no murmur rub or gallop  Respiratory: Clinically clear no fremitus  Abdomen: Morbidly obese, bowel sounds slightly diminished, slightly tender over epigastrium. Tenderness elsewhere over abdomen is well  Skin: No LE edema  Musculoskeletal: Grossly intact  Psychiatric: Euthymic but tired  Neurologic: Grossly intact  Labs on Admission:  Basic Metabolic Panel:  Lab 14/43/15 0909  NA 134*  K 4.2  CL 102  CO2 19  GLUCOSE 162*  BUN 9  CREATININE 0.70  CALCIUM 9.5  MG --  PHOS --   Liver Function Tests:  Lab 01/31/12 0909  AST 55*  ALT 92*  ALKPHOS 70  BILITOT 0.8  PROT 7.7  ALBUMIN 3.9    Lab 01/31/12 0909  LIPASE 21  AMYLASE --   No results found for this basename: AMMONIA:5 in the last 168 hours CBC:  Lab 01/31/12 0909  WBC 16.6*  NEUTROABS 14.8*  HGB 17.1*  HCT 48.6*  MCV 87.1  PLT 354   Cardiac Enzymes: No results found for this basename: CKTOTAL:5,CKMB:5,CKMBINDEX:5,TROPONINI:5 in the last 168 hours  BNP (last 3 results) No results found for this basename: PROBNP:3 in the last 8760 hours CBG: No results found for this basename: GLUCAP:5 in the last 168 hours  Radiological Exams on Admission: Ct Abdomen Pelvis W Contrast  01/31/2012  *RADIOLOGY REPORT*  Clinical Data: Nausea, vomiting, right-sided pain  CT ABDOMEN AND PELVIS WITH CONTRAST  Technique:  Multidetector CT imaging of the abdomen and pelvis was performed following the standard protocol during bolus administration of intravenous contrast.  Contrast: 45m OMNIPAQUE IOHEXOL  300 MG/ML  SOLN, 1077mOMNIPAQUE IOHEXOL 300 MG/ML  SOLN  Comparison: CT scan 04/10/09  Findings: Sagittal images of the spine are unremarkable.  Lung bases are unremarkable.  Mild hepatic fatty infiltration.  Status post cholecystectomy.  The pancreas, spleen and adrenal glands are unremarkable.  Kidneys are symmetrical in size and enhancement.  No hydronephrosis or hydroureter.  No aortic aneurysm.  No small bowel obstruction.  No ascites or free air.  No adenopathy.  No pericecal inflammation.  Normal appendix is clearly visualized in axial image 64.  The uterus and adnexa are unremarkable.  The ovaries are unremarkable.  No pelvic ascites or adenopathy.  Small nonspecific bilateral inguinal lymph nodes are noted.  IMPRESSION: 1.  No acute inflammatory process within abdomen or pelvis. 2.  No hydronephrosis  or hydroureter. 3.  No pericecal inflammation.  Normal appendix is clearly visualized. 4.  Mild hepatic fatty infiltration.   Original Report Authenticated By: Lahoma Crocker, M.D.     EKG: Independently reviewed. None performed  Assessment/Plan Active Problems:  FATTY LIVER DISEASE  Morbid obesity  Polycystic ovary disease  Tobacco abuse  Elevated LFTs  Gastroenteritis, likely viral   1. Likely viral gastroenteritis, DDX choledocholithiasis-unlikely that his LFTs are now elevated-supportive management. We will give her copious amounts of IV fluid at 200 cc an hour, because she spiked a fever we will culture blood x2, repeat CBC in a.m. She will get stool culture, ova, cyst, parasites, GI pathogen panel by PCR and because of nausea vomiting we will give her Phenergan 20.5-25 mg IV every 6 when necessary. 2. Fatty liver disease-outpatient followup with Dr. Sydell Axon 3. Polycystic ovarian disease-per endocrinologis 4. Morbid obesity, Body mass index is 44.75 kg/(m^2).-outpatient measures for management 5. Diabetes mellitus-check blood sugars every 4 hourly initially-allow clear liquid diet. Gradually  diet if able to tolerate-would hold oral hypoglycemic agents once able to tolerate diet 6. Hypotension she apparently is allergic to Diovan-blood pressure seen actually low and we'll discontinue these 7. ?gout-continue Allopurinol if able to tolerate    Code Status: Full code Family Communication: Present at bedside and understands Disposition Plan: 2-3 days  Time spent: 55 minutes  Verlon Au [facility] Pager [phone]  If 7PM-7AM, please contact night-coverage [email or web address] Password [facility] 01/31/2012, 4:03 PM

## 2012-02-01 DIAGNOSIS — E669 Obesity, unspecified: Secondary | ICD-10-CM

## 2012-02-01 DIAGNOSIS — R7989 Other specified abnormal findings of blood chemistry: Secondary | ICD-10-CM

## 2012-02-01 DIAGNOSIS — E876 Hypokalemia: Secondary | ICD-10-CM

## 2012-02-01 DIAGNOSIS — A0472 Enterocolitis due to Clostridium difficile, not specified as recurrent: Principal | ICD-10-CM

## 2012-02-01 DIAGNOSIS — E119 Type 2 diabetes mellitus without complications: Secondary | ICD-10-CM

## 2012-02-01 LAB — COMPREHENSIVE METABOLIC PANEL
AST: 39 U/L — ABNORMAL HIGH (ref 0–37)
Albumin: 3 g/dL — ABNORMAL LOW (ref 3.5–5.2)
Calcium: 8 mg/dL — ABNORMAL LOW (ref 8.4–10.5)
Creatinine, Ser: 0.77 mg/dL (ref 0.50–1.10)
Sodium: 134 mEq/L — ABNORMAL LOW (ref 135–145)
Total Protein: 6 g/dL (ref 6.0–8.3)

## 2012-02-01 LAB — CBC
HCT: 40.9 % (ref 36.0–46.0)
Hemoglobin: 13.7 g/dL (ref 12.0–15.0)
MCH: 30.4 pg (ref 26.0–34.0)
MCV: 90.7 fL (ref 78.0–100.0)
RBC: 4.51 MIL/uL (ref 3.87–5.11)

## 2012-02-01 LAB — GLUCOSE, CAPILLARY

## 2012-02-01 MED ORDER — METRONIDAZOLE 500 MG PO TABS
500.0000 mg | ORAL_TABLET | Freq: Three times a day (TID) | ORAL | Status: DC
  Administered 2012-02-01 – 2012-02-05 (×12): 500 mg via ORAL
  Filled 2012-02-01 (×12): qty 1

## 2012-02-01 MED ORDER — ACETAMINOPHEN 325 MG PO TABS
650.0000 mg | ORAL_TABLET | ORAL | Status: DC | PRN
  Administered 2012-02-01 – 2012-02-05 (×2): 650 mg via ORAL
  Filled 2012-02-01 (×2): qty 2

## 2012-02-01 MED ORDER — VANCOMYCIN HCL 10 G IV SOLR
1500.0000 mg | Freq: Two times a day (BID) | INTRAVENOUS | Status: DC
  Administered 2012-02-01 – 2012-02-03 (×4): 1500 mg via INTRAVENOUS
  Filled 2012-02-01 (×4): qty 1500

## 2012-02-01 MED ORDER — SACCHAROMYCES BOULARDII 250 MG PO CAPS
250.0000 mg | ORAL_CAPSULE | Freq: Two times a day (BID) | ORAL | Status: DC
  Administered 2012-02-01 – 2012-02-05 (×8): 250 mg via ORAL
  Filled 2012-02-01 (×8): qty 1

## 2012-02-01 MED ORDER — POTASSIUM CHLORIDE CRYS ER 20 MEQ PO TBCR
40.0000 meq | EXTENDED_RELEASE_TABLET | Freq: Once | ORAL | Status: AC
  Administered 2012-02-01: 40 meq via ORAL
  Filled 2012-02-01: qty 2

## 2012-02-01 MED ORDER — METRONIDAZOLE IN NACL 5-0.79 MG/ML-% IV SOLN
500.0000 mg | Freq: Three times a day (TID) | INTRAVENOUS | Status: DC
  Administered 2012-02-01: 500 mg via INTRAVENOUS
  Filled 2012-02-01 (×4): qty 100

## 2012-02-01 NOTE — Progress Notes (Signed)
CRITICAL VALUE ALERT  Critical value received: Bllod Culture  Gram + cocci with clusters  Date of notification:  02/01/12  Time of notification:  1655  Critical value read back:yes  Nurse who received alert:  Koleen Nimrod RN  MD notified (1st page):  Dr Kerry Hough  Time of first page:  1655  MD notified (2nd page):  Time of second page:  Responding MD:  Dr Kerry Hough  Time MD responded:  [phone]

## 2012-02-01 NOTE — Progress Notes (Signed)
ANTIBIOTIC CONSULT NOTE - INITIAL  Pharmacy Consult for Vancomycin Indication: Gram+ bacteremia  Allergies  Allergen Reactions  . Diovan (Valsartan) Swelling    Nose   . Influenza Vaccine Live Swelling    Arm     Patient Measurements: Height: 5\' 9"  (175.3 cm) Weight: 303 lb (137.44 kg) IBW/kg (Calculated) : 66.2    Vital Signs: Temp: 98.1 F (36.7 C) (01/26 1355) Temp src: Oral (01/26 1355) BP: 109/65 mmHg (01/26 1355) Pulse Rate: 80  (01/26 1355) Intake/Output from previous day:   Intake/Output from this shift: Total I/O In: 440 [P.O.:440] Out: 106 [Urine:100; Stool:6]  Labs:  [facility] 02/01/12 0625 01/31/12 0909  WBC 5.6 16.6*  HGB 13.7 17.1*  PLT 210 354  LABCREA -- --  CREATININE 0.77 0.70   Estimated Creatinine Clearance: 157.9 ml/min (by C-G formula based on Cr of 0.77). No results found for this basename: VANCOTROUGH:2,VANCOPEAK:2,VANCORANDOM:2,GENTTROUGH:2,GENTPEAK:2,GENTRANDOM:2,TOBRATROUGH:2,TOBRAPEAK:2,TOBRARND:2,AMIKACINPEAK:2,AMIKACINTROU:2,AMIKACIN:2, in the last 72 hours   Microbiology: Recent Results (from the past 720 hour(s))  CULTURE, BLOOD (ROUTINE X 2)     Status: Normal (Preliminary result)   Collection Time   01/31/12  3:45 PM      Component Value Range Status Comment   Specimen Description LEFT ANTECUBITAL   Final    Special Requests BOTTLES DRAWN AEROBIC AND ANAEROBIC 4CC   Final    Culture PENDING   Incomplete    Report Status PENDING   Incomplete   CULTURE, BLOOD (ROUTINE X 2)     Status: Normal (Preliminary result)   Collection Time   01/31/12  3:45 PM      Component Value Range Status Comment   Specimen Description RIGHT ANTECUBITAL   Final    Special Requests BOTTLES DRAWN AEROBIC AND ANAEROBIC 4CC   Final    Culture  Setup Time     Final    Value: GRAM POSITIVE COCCI IN CLUSTERS     Gram Stain Report Called to,Read Back By and Verified With:  MORRIS, C. ATB 16:50PM BY PRUITT, C.   Culture PENDING   Incomplete    Report  Status PENDING   Incomplete   CLOSTRIDIUM DIFFICILE BY PCR     Status: Abnormal   Collection Time   01/31/12  9:31 PM      Component Value Range Status Comment   C difficile by pcr POSITIVE (*) NEGATIVE Final     Medical History: Past Medical History  Diagnosis Date  . Fibrocystic breast disease   . Acid reflux   . Gout   . Polycystic ovary syndrome   . Fatty liver   . Diabetes mellitus   . Renal disorder     kidney stones    Medications:  Scheduled:    . allopurinol  300 mg Oral Daily  . heparin  5,000 Units Subcutaneous Q8H  . metroNIDAZOLE  500 mg Oral Q8H  . [EXPIRED] ondansetron      . [COMPLETED] potassium chloride  40 mEq Oral Once  . [EXPIRED] promethazine      . saccharomyces boulardii  250 mg Oral BID  . sodium chloride  3 mL Intravenous Q12H  . vancomycin  1,500 mg Intravenous Q12H  . [DISCONTINUED] metronidazole  500 mg Intravenous Q8H   Assessment: Obese patient Excellent renal function  Goal of Therapy:  Vancomycin trough level 15-20 mcg/ml  Plan:  Vancomycin 1500 mg IV every 12 hours Vancomycin trough at steady state Labs per protocol  Raquel James, Lucresia Simic Bennett 02/01/2012,5:49 PM

## 2012-02-01 NOTE — Progress Notes (Addendum)
[facility]             Progress Note   Subjective: Patient has nausea but no vomiting. She continues with loose bowel movements.  Objective: Vital signs in last 24 hours: Temp:  [98.1 F (36.7 C)-98.7 F (37.1 C)] 98.1 F (36.7 C) (01/26 1355) Pulse Rate:  [80-156] 80  (01/26 1355) Resp:  [16-20] 18  (01/26 1355) BP: (100-119)/(56-71) 109/65 mmHg (01/26 1355) SpO2:  [94 %-100 %] 95 % (01/26 1355) Weight change:  Last BM Date: 02/01/12  Intake/Output from previous day:   Total I/O In: -  Out: 106 [Urine:100; Stool:6]   Physical Exam: General: Alert, awake, oriented x3, in no acute distress. HEENT: No bruits, no goiter. Heart: Regular rate and rhythm, without murmurs, rubs, gallops. Lungs: Clear to auscultation bilaterally. Abdomen: Soft, nontender, nondistended, positive bowel sounds. Extremities: No clubbing cyanosis or edema with positive pedal pulses. Neuro: Grossly intact, nonfocal.    Lab Results: Basic Metabolic Panel:  Basename 02/01/12 0625 01/31/12 0909  NA 134* 134*  K 3.4* 4.2  CL 101 102  CO2 27 19  GLUCOSE 96 162*  BUN 6 9  CREATININE 0.77 0.70  CALCIUM 8.0* 9.5  MG -- --  PHOS -- --   Liver Function Tests:  [facility] 02/01/12 0625 01/31/12 0909  AST 39* 55*  ALT 61* 92*  ALKPHOS 46 70  BILITOT 1.0 0.8  PROT 6.0 7.7  ALBUMIN 3.0* 3.9    Basename 01/31/12 0909  LIPASE 21  AMYLASE --   No results found for this basename: AMMONIA:2 in the last 72 hours CBC:  Basename 02/01/12 0625 01/31/12 0909  WBC 5.6 16.6*  NEUTROABS -- 14.8*  HGB 13.7 17.1*  HCT 40.9 48.6*  MCV 90.7 87.1  PLT 210 354   Cardiac Enzymes: No results found for this basename: CKTOTAL:3,CKMB:3,CKMBINDEX:3,TROPONINI:3 in the last 72 hours BNP: No results found for this basename: PROBNP:3 in the last 72 hours D-Dimer: No results found for this basename: DDIMER:2 in the last 72 hours CBG:  Basename 02/01/12 0753  GLUCAP 95   Hemoglobin  A1C: No results found for this basename: HGBA1C in the last 72 hours Fasting Lipid Panel: No results found for this basename: CHOL,HDL,LDLCALC,TRIG,CHOLHDL,LDLDIRECT in the last 72 hours Thyroid Function Tests: No results found for this basename: TSH,T4TOTAL,FREET4,T3FREE,THYROIDAB in the last 72 hours Anemia Panel: No results found for this basename: VITAMINB12,FOLATE,FERRITIN,TIBC,IRON,RETICCTPCT in the last 72 hours Coagulation: No results found for this basename: LABPROT:2,INR:2 in the last 72 hours Urine Drug Screen: Drugs of Abuse  No results found for this basename: labopia, cocainscrnur, labbenz, amphetmu, thcu, labbarb    Alcohol Level: No results found for this basename: ETH:2 in the last 72 hours Urinalysis:  Basename 01/31/12 1039  COLORURINE YELLOW  LABSPEC <1.005*  PHURINE 7.0  GLUCOSEU NEGATIVE  HGBUR TRACE*  BILIRUBINUR NEGATIVE  KETONESUR NEGATIVE  PROTEINUR NEGATIVE  UROBILINOGEN 0.2  NITRITE NEGATIVE  LEUKOCYTESUR NEGATIVE    Recent Results (from the past 240 hour(s))  CULTURE, BLOOD (ROUTINE X 2)     Status: Normal (Preliminary result)   Collection Time   01/31/12  3:45 PM      Component Value Range Status Comment   Specimen Description LEFT ANTECUBITAL   Final    Special Requests BOTTLES DRAWN AEROBIC AND ANAEROBIC 4CC   Final    Culture PENDING   Incomplete    Report Status PENDING   Incomplete   CULTURE, BLOOD (ROUTINE X 2)     Status:  Normal (Preliminary result)   Collection Time   01/31/12  3:45 PM      Component Value Range Status Comment   Specimen Description RIGHT ANTECUBITAL   Final    Special Requests BOTTLES DRAWN AEROBIC AND ANAEROBIC 4CC   Final    Culture PENDING   Incomplete    Report Status PENDING   Incomplete   CLOSTRIDIUM DIFFICILE BY PCR     Status: Abnormal   Collection Time   01/31/12  9:31 PM      Component Value Range Status Comment   C difficile by pcr POSITIVE (*) NEGATIVE Final     Studies/Results: Ct Abdomen Pelvis  W Contrast  01/31/2012  *RADIOLOGY REPORT*  Clinical Data: Nausea, vomiting, right-sided pain  CT ABDOMEN AND PELVIS WITH CONTRAST  Technique:  Multidetector CT imaging of the abdomen and pelvis was performed following the standard protocol during bolus administration of intravenous contrast.  Contrast: 54m OMNIPAQUE IOHEXOL 300 MG/ML  SOLN, 1056mOMNIPAQUE IOHEXOL 300 MG/ML  SOLN  Comparison: CT scan 04/10/09  Findings: Sagittal images of the spine are unremarkable.  Lung bases are unremarkable.  Mild hepatic fatty infiltration.  Status post cholecystectomy.  The pancreas, spleen and adrenal glands are unremarkable.  Kidneys are symmetrical in size and enhancement.  No hydronephrosis or hydroureter.  No aortic aneurysm.  No small bowel obstruction.  No ascites or free air.  No adenopathy.  No pericecal inflammation.  Normal appendix is clearly visualized in axial image 64.  The uterus and adnexa are unremarkable.  The ovaries are unremarkable.  No pelvic ascites or adenopathy.  Small nonspecific bilateral inguinal lymph nodes are noted.  IMPRESSION: 1.  No acute inflammatory process within abdomen or pelvis. 2.  No hydronephrosis or hydroureter. 3.  No pericecal inflammation.  Normal appendix is clearly visualized. 4.  Mild hepatic fatty infiltration.   Original Report Authenticated By: LiLahoma CrockerM.D.     Medications: Scheduled Meds:   . allopurinol  300 mg Oral Daily  . heparin  5,000 Units Subcutaneous Q8H  . metronidazole  500 mg Intravenous Q8H  . sodium chloride  3 mL Intravenous Q12H   Continuous Infusions:   . sodium chloride 1,000 mL (02/01/12 1255)   PRN Meds:.ondansetron (ZOFRAN) IV, ondansetron, promethazine  Assessment/Plan:  Principal Problem:  *C. difficile enteritis Active Problems:  FATTY LIVER DISEASE  Morbid obesity  Polycystic ovary disease  Tobacco abuse  Elevated LFTs  Plan:  1. Clostridium difficile enteritis. Patient continues to have diarrhea and GI upset. She  was started on Flagyl today. We will also start probiotics. Advance diet as tolerated. WBC count is normal and she is afebrile. 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  2. Elevated LFTs he fatty liver disease. Follow up with GI as an outpatient.  3. Hypokalemia, replace  Time spent coordinating care:    LOS: 1 day   [facility] Pager: [phone]/26/2014, 2:00 PM   Addendum:  Informed by staff that patient has 1/2 positive blood cultures for GPC in clusters.  Second culture still pending.  Will start empirically on vancomycin. Follow up sensitivities.

## 2012-02-02 ENCOUNTER — Inpatient Hospital Stay (HOSPITAL_COMMUNITY): Payer: 59

## 2012-02-02 LAB — BASIC METABOLIC PANEL
CO2: 24 mEq/L (ref 19–32)
Calcium: 8.1 mg/dL — ABNORMAL LOW (ref 8.4–10.5)
Chloride: 108 mEq/L (ref 96–112)
GFR calc Af Amer: >90 mL/min (ref >90)
GFR calc non Af Amer: >90 mL/min (ref >90)

## 2012-02-02 LAB — GI PATHOGEN PANEL BY PCR, STOOL
Cryptosporidium by PCR: NEGATIVE
E coli (ETEC) LT/ST: NEGATIVE
Norovirus GI/GII: POSITIVE
Shigella by PCR: NEGATIVE

## 2012-02-02 LAB — HEPATIC FUNCTION PANEL
AST: 69 U/L — ABNORMAL HIGH (ref 0–37)
Bilirubin, Direct: 0.2 mg/dL (ref 0.0–0.3)
Indirect Bilirubin: 0.3 mg/dL (ref 0.3–0.9)
Total Bilirubin: 0.5 mg/dL (ref 0.3–1.2)

## 2012-02-02 LAB — LIPASE, BLOOD: Lipase: 14 U/L (ref 11–59)

## 2012-02-02 MED ORDER — PANTOPRAZOLE SODIUM 40 MG IV SOLR
40.0000 mg | Freq: Two times a day (BID) | INTRAVENOUS | Status: DC
  Administered 2012-02-02 – 2012-02-04 (×4): 40 mg via INTRAVENOUS
  Filled 2012-02-02 (×4): qty 40

## 2012-02-02 MED ORDER — GI COCKTAIL ~~LOC~~
30.0000 mL | Freq: Three times a day (TID) | ORAL | Status: DC
  Administered 2012-02-02 – 2012-02-03 (×3): 30 mL via ORAL
  Filled 2012-02-02 (×5): qty 30

## 2012-02-02 MED ORDER — SODIUM CHLORIDE 0.9 % IV SOLN
1000.0000 mL | INTRAVENOUS | Status: DC
  Administered 2012-02-02 – 2012-02-04 (×4): 1000 mL via INTRAVENOUS

## 2012-02-02 NOTE — Progress Notes (Addendum)
[facility]             Progress Note   Subjective: Patient has continued nausea and vomiting.  Diarrhea is improving.  Stools are more solid.  She is passing gas.  Complains of worsening abdominal pain, periumbilical, described as burning pain.  Objective: Vital signs in last 24 hours: Temp:  [97.6 F (36.4 C)-98.4 F (36.9 C)] 98.4 F (36.9 C) (01/27 1400) Pulse Rate:  [60-78] 61  (01/27 1400) Resp:  [18] 18  (01/27 1400) BP: (99-125)/(59-87) 125/87 mmHg (01/27 1400) SpO2:  [97 %-98 %] 98 % (01/27 1400) Weight change:  Last BM Date: 02/02/12  Intake/Output from previous day: 01/26 0701 - 01/27 0700 In: 6064.6 [P.O.:440; I.V.:5624.6] Out: 656 [Urine:650; Stool:6] Total I/O In: 360 [P.O.:360] Out: 550 [Urine:550]   Physical Exam: General: Alert, awake, oriented x3, in no acute distress. HEENT: No bruits, no goiter. Heart: Regular rate and rhythm, without murmurs, rubs, gallops. Lungs: Clear to auscultation bilaterally. Abdomen: Soft, tender in epigastric region, nondistended, positive bowel sounds. Extremities: No clubbing cyanosis or edema with positive pedal pulses. Neuro: Grossly intact, nonfocal.    Lab Results: Basic Metabolic Panel:  Basename 02/02/12 0611 02/01/12 0625  NA 140 134*  K 3.6 3.4*  CL 108 101  CO2 24 27  GLUCOSE 106* 96  BUN 5* 6  CREATININE 0.68 0.77  CALCIUM 8.1* 8.0*  MG -- --  PHOS -- --   Liver Function Tests:  [facility] 02/01/12 0625 01/31/12 0909  AST 39* 55*  ALT 61* 92*  ALKPHOS 46 70  BILITOT 1.0 0.8  PROT 6.0 7.7  ALBUMIN 3.0* 3.9    Basename 01/31/12 0909  LIPASE 21  AMYLASE --   No results found for this basename: AMMONIA:2 in the last 72 hours CBC:  Basename 02/01/12 0625 01/31/12 0909  WBC 5.6 16.6*  NEUTROABS -- 14.8*  HGB 13.7 17.1*  HCT 40.9 48.6*  MCV 90.7 87.1  PLT 210 354   Cardiac Enzymes: No results found for this basename: CKTOTAL:3,CKMB:3,CKMBINDEX:3,TROPONINI:3 in the last 72  hours BNP: No results found for this basename: PROBNP:3 in the last 72 hours D-Dimer: No results found for this basename: DDIMER:2 in the last 72 hours CBG:  Basename 02/01/12 0753  GLUCAP 95   Hemoglobin A1C: No results found for this basename: HGBA1C in the last 72 hours Fasting Lipid Panel: No results found for this basename: CHOL,HDL,LDLCALC,TRIG,CHOLHDL,LDLDIRECT in the last 72 hours Thyroid Function Tests: No results found for this basename: TSH,T4TOTAL,FREET4,T3FREE,THYROIDAB in the last 72 hours Anemia Panel: No results found for this basename: VITAMINB12,FOLATE,FERRITIN,TIBC,IRON,RETICCTPCT in the last 72 hours Coagulation: No results found for this basename: LABPROT:2,INR:2 in the last 72 hours Urine Drug Screen: Drugs of Abuse  No results found for this basename: labopia,  cocainscrnur,  labbenz,  amphetmu,  thcu,  labbarb    Alcohol Level: No results found for this basename: ETH:2 in the last 72 hours Urinalysis:  Basename 01/31/12 1039  COLORURINE YELLOW  LABSPEC <1.005*  PHURINE 7.0  GLUCOSEU NEGATIVE  HGBUR TRACE*  BILIRUBINUR NEGATIVE  KETONESUR NEGATIVE  PROTEINUR NEGATIVE  UROBILINOGEN 0.2  NITRITE NEGATIVE  LEUKOCYTESUR NEGATIVE    Recent Results (from the past 240 hour(s))  CULTURE, BLOOD (ROUTINE X 2)     Status: Normal (Preliminary result)   Collection Time   01/31/12  3:45 PM      Component Value Range Status Comment   Specimen Description LEFT ANTECUBITAL   Final    Special Requests BOTTLES  DRAWN AEROBIC AND ANAEROBIC 4CC   Final    Culture NO GROWTH 2 DAYS   Final    Report Status PENDING   Incomplete   CULTURE, BLOOD (ROUTINE X 2)     Status: Normal (Preliminary result)   Collection Time   01/31/12  3:45 PM      Component Value Range Status Comment   Specimen Description RIGHT ANTECUBITAL   Final    Special Requests BOTTLES DRAWN AEROBIC AND ANAEROBIC 4CC   Final    Culture  Setup Time 02/01/2012 23:05   Final    Culture     Final     Value:        BLOOD CULTURE RECEIVED NO GROWTH TO DATE CULTURE WILL BE HELD FOR 5 DAYS BEFORE ISSUING A FINAL NEGATIVE REPORT   Report Status PENDING   Incomplete   STOOL CULTURE     Status: Normal (Preliminary result)   Collection Time   01/31/12  9:31 PM      Component Value Range Status Comment   Specimen Description STOOL   Final    Special Requests NONE   Final    Culture Culture reincubated for better growth   Final    Report Status PENDING   Incomplete   CLOSTRIDIUM DIFFICILE BY PCR     Status: Abnormal   Collection Time   01/31/12  9:31 PM      Component Value Range Status Comment   C difficile by pcr POSITIVE (*) NEGATIVE Final     Studies/Results: No results found.  Medications: Scheduled Meds:    . allopurinol  300 mg Oral Daily  . gi cocktail  30 mL Oral TID  . heparin  5,000 Units Subcutaneous Q8H  . metroNIDAZOLE  500 mg Oral Q8H  . pantoprazole (PROTONIX) IV  40 mg Intravenous Q12H  . saccharomyces boulardii  250 mg Oral BID  . sodium chloride  3 mL Intravenous Q12H  . vancomycin  1,500 mg Intravenous Q12H   Continuous Infusions:    . sodium chloride     PRN Meds:.acetaminophen, ondansetron (ZOFRAN) IV, ondansetron, promethazine  Assessment/Plan:  Principal Problem:  *C. difficile enteritis Active Problems:  FATTY LIVER DISEASE  Morbid obesity  Polycystic ovary disease  Tobacco abuse  Elevated LFTs  Plan:  1. Clostridium difficile enteritis. Patient continues to have diarrhea and GI upset. Continue Flagyl and probiotics Advance diet as tolerated. WBC count is normal and she is afebrile. 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  2. Abdominal pain.  Check abdominal xray, lfts and lipase.  Will offer GI cocktail, and start protonix.  2. Elevated LFTs he fatty liver disease. Follow up with GI as an outpatient.  3. Hypokalemia, replace  4. Positive blood culture  for GPC in clusters.  Likely contaminant.  Continue vancomycin until sensitivities are available  Time spent coordinating care: 18mns   LOS: 2 days   Greenlee Ancheta [facility] Pager: [phone]/27/2014, 3:57 PM

## 2012-02-02 NOTE — Progress Notes (Signed)
Patient positive for C-difficile A&B toxins,and Norovirus,Dr Orvan Falconer notified.

## 2012-02-02 NOTE — Progress Notes (Signed)
UR Chart Review Completed  

## 2012-02-03 LAB — URINALYSIS, ROUTINE W REFLEX MICROSCOPIC
Bilirubin Urine: NEGATIVE
Ketones, ur: NEGATIVE mg/dL
Nitrite: NEGATIVE
Protein, ur: NEGATIVE mg/dL
Specific Gravity, Urine: 1.02 (ref 1.005–1.030)
Urobilinogen, UA: 0.2 mg/dL (ref 0.0–1.0)

## 2012-02-03 LAB — BASIC METABOLIC PANEL
Calcium: 8.4 mg/dL (ref 8.4–10.5)
GFR calc Af Amer: >90 mL/min (ref >90)
GFR calc non Af Amer: >90 mL/min (ref >90)
Potassium: 3.6 mEq/L (ref 3.5–5.1)
Sodium: 140 mEq/L (ref 135–145)

## 2012-02-03 LAB — OVA AND PARASITE EXAMINATION: Ova and parasites: NONE SEEN

## 2012-02-03 LAB — CBC
MCH: 30 pg (ref 26.0–34.0)
MCHC: 33.7 g/dL (ref 30.0–36.0)
Platelets: 227 10*3/uL (ref 150–400)
RBC: 4.43 MIL/uL (ref 3.87–5.11)
RDW: 13 % (ref 11.5–15.5)

## 2012-02-03 LAB — VANCOMYCIN, TROUGH: Vancomycin Tr: 10.8 ug/mL (ref 10.0–20.0)

## 2012-02-03 MED ORDER — MORPHINE SULFATE 2 MG/ML IJ SOLN: 2.0000 mg | INTRAMUSCULAR | Status: DC | PRN

## 2012-02-03 MED ORDER — SODIUM CHLORIDE 0.9 % IV SOLN
1250.0000 mg | Freq: Three times a day (TID) | INTRAVENOUS | Status: DC
  Filled 2012-02-03 (×3): qty 1250

## 2012-02-03 MED ORDER — DICYCLOMINE HCL 10 MG PO CAPS
20.0000 mg | ORAL_CAPSULE | Freq: Three times a day (TID) | ORAL | Status: DC
  Administered 2012-02-03 – 2012-02-04 (×4): 20 mg via ORAL
  Filled 2012-02-03 (×4): qty 2

## 2012-02-03 MED ORDER — VANCOMYCIN HCL 10 G IV SOLR
1500.0000 mg | Freq: Two times a day (BID) | INTRAVENOUS | Status: DC
  Administered 2012-02-03 – 2012-02-04 (×3): 1500 mg via INTRAVENOUS
  Filled 2012-02-03 (×5): qty 1500

## 2012-02-03 NOTE — Progress Notes (Signed)
ANTIBIOTIC CONSULT NOTE   Pharmacy Consult for Vancomycin Indication: Gram+ bacteremia  Allergies  Allergen Reactions  . Diovan (Valsartan) Swelling    Nose   . Influenza Vaccine Live Swelling    Arm    Patient Measurements: Height: 5\' 9"  (175.3 cm) Weight: 303 lb (137.44 kg) IBW/kg (Calculated) : 66.2   Vital Signs: Temp: 98.4 F (36.9 C) (01/28 0548) Temp src: Oral (01/28 0548) BP: 130/87 mmHg (01/28 0550) Pulse Rate: 78  (01/28 0550) Intake/Output from previous day: 01/27 0701 - 01/28 0700 In: 3232 [P.O.:480; I.V.:1750; IV Piggyback:1002] Out: 900 [Urine:900] Intake/Output from this shift:    Labs:  Basename 02/03/12 0648 02/02/12 0611 02/01/12 0625  WBC 6.2 -- 5.6  HGB 13.3 -- 13.7  PLT 227 -- 210  LABCREA -- -- --  CREATININE 0.72 0.68 0.77   Estimated Creatinine Clearance: 157.9 ml/min (by C-G formula based on Cr of 0.72).  Basename 02/03/12 0652  VANCOTROUGH 10.8  VANCOPEAK --  VANCORANDOM --  GENTTROUGH --  GENTPEAK --  GENTRANDOM --  TOBRATROUGH --  TOBRAPEAK --  TOBRARND --  AMIKACINPEAK --  AMIKACINTROU --  AMIKACIN --    Microbiology: Recent Results (from the past 720 hour(s))  CULTURE, BLOOD (ROUTINE X 2)     Status: Normal (Preliminary result)   Collection Time   01/31/12  3:45 PM      Component Value Range Status Comment   Specimen Description LEFT ANTECUBITAL   Final    Special Requests BOTTLES DRAWN AEROBIC AND ANAEROBIC 4CC   Final    Culture NO GROWTH 2 DAYS   Final    Report Status PENDING   Incomplete   CULTURE, BLOOD (ROUTINE X 2)     Status: Normal (Preliminary result)   Collection Time   01/31/12  3:45 PM      Component Value Range Status Comment   Specimen Description RIGHT ANTECUBITAL   Final    Special Requests BOTTLES DRAWN AEROBIC AND ANAEROBIC 4CC   Final    Culture  Setup Time 02/01/2012 23:05   Final    Culture     Final    Value:        BLOOD CULTURE RECEIVED NO GROWTH TO DATE CULTURE WILL BE HELD FOR 5 DAYS  BEFORE ISSUING A FINAL NEGATIVE REPORT   Report Status PENDING   Incomplete   STOOL CULTURE     Status: Normal (Preliminary result)   Collection Time   01/31/12  9:31 PM      Component Value Range Status Comment   Specimen Description STOOL   Final    Special Requests NONE   Final    Culture Culture reincubated for better growth   Final    Report Status PENDING   Incomplete   CLOSTRIDIUM DIFFICILE BY PCR     Status: Abnormal   Collection Time   01/31/12  9:31 PM      Component Value Range Status Comment   C difficile by pcr POSITIVE (*) NEGATIVE Final    Medical History: Past Medical History  Diagnosis Date  . Fibrocystic breast disease   . Acid reflux   . Gout   . Polycystic ovary syndrome   . Fatty liver   . Diabetes mellitus   . Renal disorder     kidney stones   Medications:  Scheduled:     . allopurinol  300 mg Oral Daily  . dicyclomine  20 mg Oral TID AC  . gi cocktail  30 mL  Oral TID  . heparin  5,000 Units Subcutaneous Q8H  . metroNIDAZOLE  500 mg Oral Q8H  . pantoprazole (PROTONIX) IV  40 mg Intravenous Q12H  . saccharomyces boulardii  250 mg Oral BID  . sodium chloride  3 mL Intravenous Q12H  . vancomycin  1,250 mg Intravenous Q8H  . [DISCONTINUED] vancomycin  1,500 mg Intravenous Q12H   Assessment: Obese patient with excellent renal function.  Estimated Creatinine Clearance: 157.9 ml/min (by C-G formula based on Cr of 0.72).   Trough level is below goal.  Excellent Vancomycin clearance.  SCr is stable.   Goal of Therapy:  Vancomycin trough level 15-20 mcg/ml  Plan:  Change Vancomycin to 1250 mg IV every 8 hours Re-check Vancomycin trough when appropriate Labs per protocol  Cheryl Black A 02/03/2012,10:06 AM

## 2012-02-03 NOTE — Progress Notes (Addendum)
[facility]             Progress Note   Subjective: Patient is feeling better today.  Abdominal pain is slightly better today.  No diarrhea since yesterday. No vomiting. Wants to advance diet.  Objective: Vital signs in last 24 hours: Temp:  [98.1 F (36.7 C)-98.4 F (36.9 C)] 98.4 F (36.9 C) (01/28 0548) Pulse Rate:  [58-78] 78  (01/28 0550) Resp:  [16] 16  (01/28 0548) BP: (106-130)/(70-87) 130/87 mmHg (01/28 0550) SpO2:  [96 %-100 %] 100 % (01/28 0548) Weight change:  Last BM Date: 02/02/12  Intake/Output from previous day: 01/27 0701 - 01/28 0700 In: 3232 [P.O.:480; I.V.:1750; IV Piggyback:1002] Out: 900 [Urine:900]     Physical Exam: General: Alert, awake, oriented x3, in no acute distress. HEENT: No bruits, no goiter. Heart: Regular rate and rhythm, without murmurs, rubs, gallops. Lungs: Clear to auscultation bilaterally. Abdomen: Soft, tender in epigastric region, nondistended, positive bowel sounds. Extremities: No clubbing cyanosis or edema with positive pedal pulses. Neuro: Grossly intact, nonfocal.    Lab Results: Basic Metabolic Panel:  Basename 02/03/12 0648 02/02/12 0611  NA 140 140  K 3.6 3.6  CL 107 108  CO2 27 24  GLUCOSE 98 106*  BUN 3* 5*  CREATININE 0.72 0.68  CALCIUM 8.4 8.1*  MG -- --  PHOS -- --   Liver Function Tests:  [facility] 02/02/12 0611 02/01/12 0625  AST 69* 39*  ALT 82* 61*  ALKPHOS 42 46  BILITOT 0.5 1.0  PROT 6.0 6.0  ALBUMIN 3.1* 3.0*    Basename 02/02/12 0611  LIPASE 14  AMYLASE --   No results found for this basename: AMMONIA:2 in the last 72 hours CBC:  Basename 02/03/12 0648 02/01/12 0625  WBC 6.2 5.6  NEUTROABS -- --  HGB 13.3 13.7  HCT 39.5 40.9  MCV 89.2 90.7  PLT 227 210   Cardiac Enzymes: No results found for this basename: CKTOTAL:3,CKMB:3,CKMBINDEX:3,TROPONINI:3 in the last 72 hours BNP: No results found for this basename: PROBNP:3 in the last 72 hours D-Dimer: No results  found for this basename: DDIMER:2 in the last 72 hours CBG:  Basename 02/01/12 0753  GLUCAP 95   Hemoglobin A1C: No results found for this basename: HGBA1C in the last 72 hours Fasting Lipid Panel: No results found for this basename: CHOL,HDL,LDLCALC,TRIG,CHOLHDL,LDLDIRECT in the last 72 hours Thyroid Function Tests: No results found for this basename: TSH,T4TOTAL,FREET4,T3FREE,THYROIDAB in the last 72 hours Anemia Panel: No results found for this basename: VITAMINB12,FOLATE,FERRITIN,TIBC,IRON,RETICCTPCT in the last 72 hours Coagulation: No results found for this basename: LABPROT:2,INR:2 in the last 72 hours Urine Drug Screen: Drugs of Abuse  No results found for this basename: labopia,  cocainscrnur,  labbenz,  amphetmu,  thcu,  labbarb    Alcohol Level: No results found for this basename: ETH:2 in the last 72 hours Urinalysis: No results found for this basename: COLORURINE:2,APPERANCEUR:2,LABSPEC:2,PHURINE:2,GLUCOSEU:2,HGBUR:2,BILIRUBINUR:2,KETONESUR:2,PROTEINUR:2,UROBILINOGEN:2,NITRITE:2,LEUKOCYTESUR:2 in the last 72 hours  Recent Results (from the past 240 hour(s))  CULTURE, BLOOD (ROUTINE X 2)     Status: Normal (Preliminary result)   Collection Time   01/31/12  3:45 PM      Component Value Range Status Comment   Specimen Description BLOOD LEFT ANTECUBITAL   Final    Special Requests BOTTLES DRAWN AEROBIC AND ANAEROBIC 4CC   Final    Culture NO GROWTH 3 DAYS   Final    Report Status PENDING   Incomplete   CULTURE, BLOOD (ROUTINE X 2)  Status: Normal (Preliminary result)   Collection Time   01/31/12  3:45 PM      Component Value Range Status Comment   Specimen Description BLOOD RIGHT ANTECUBITAL   Final    Special Requests BOTTLES DRAWN AEROBIC AND ANAEROBIC 4CC   Final    Culture  Setup Time 02/01/2012 23:05   Final    Culture     Final    Value: STAPHYLOCOCCUS SPECIES     Note: Gram Stain Report Called to,Read Back By and Verified With: MORRIS C. AT 16:50 [id number] BY  PRUITT C.   Report Status PENDING   Incomplete   STOOL CULTURE     Status: Normal (Preliminary result)   Collection Time   01/31/12  9:31 PM      Component Value Range Status Comment   Specimen Description STOOL   Final    Special Requests NONE   Final    Culture Culture reincubated for better growth   Final    Report Status PENDING   Incomplete   CLOSTRIDIUM DIFFICILE BY PCR     Status: Abnormal   Collection Time   01/31/12  9:31 PM      Component Value Range Status Comment   C difficile by pcr POSITIVE (*) NEGATIVE Final     Studies/Results: Dg Abd 2 Views  12-Feb-2012  *RADIOLOGY REPORT*  Clinical Data: Abdominal pain and vomiting.  ABDOMEN - 2 VIEW  Comparison: CT scan 01/31/2012.  Findings: There is scattered air in the colon and small bowel with scattered air fluid levels.  No findings for obstruction and/or perforation.  The soft tissue shadows are maintained.  The bony structures are intact.  IMPRESSION: Nonspecific bowel gas pattern.  Possible ileus or gastroenteritis. No findings for obstruction or perforation.   Original Report Authenticated By: Marijo Sanes, M.D.     Medications: Scheduled Meds:    . allopurinol  300 mg Oral Daily  . dicyclomine  20 mg Oral TID AC  . gi cocktail  30 mL Oral TID  . heparin  5,000 Units Subcutaneous Q8H  . metroNIDAZOLE  500 mg Oral Q8H  . pantoprazole (PROTONIX) IV  40 mg Intravenous Q12H  . saccharomyces boulardii  250 mg Oral BID  . sodium chloride  3 mL Intravenous Q12H   Continuous Infusions:    . sodium chloride 1,000 mL (02/03/12 1019)   PRN Meds:.acetaminophen, ondansetron (ZOFRAN) IV, ondansetron, promethazine  Assessment/Plan:  Principal Problem:  *C. difficile enteritis Active Problems:  FATTY LIVER DISEASE  Morbid obesity  Polycystic ovary disease  Tobacco abuse  Elevated LFTs  Plan:  1. Clostridium difficile enteritis. Diarrhea resolved. Continue Flagyl and probiotics Advance diet as tolerated. WBC count is  normal and she is afebrile. 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  2. Abdominal pain.  Possibly related to #1.  Lipase and lfts unremarkable. Felt some relief with gi cocktail, protonix.  Started on bentyl.  2. Elevated LFTs with fatty liver disease. Follow up with GI as an outpatient. LFTs are at baseline  3. Hypokalemia, replace  4. Positive blood culture for GPC in clusters.  Likely contaminant.  Continue vancomycin until sensitivities are available  Time spent coordinating care: 23mns   LOS: 3 days   Keidy Thurgood [facility] Pager: [phone] 05191/28/2014, 2:02 PM   Addendum:  Patient is having significant epigastric discomfort after eating.  Lipase normal. Will ask GI to see  if she needs an endoscopy.

## 2012-02-04 ENCOUNTER — Inpatient Hospital Stay (HOSPITAL_COMMUNITY): Payer: 59

## 2012-02-04 DIAGNOSIS — R1013 Epigastric pain: Secondary | ICD-10-CM

## 2012-02-04 DIAGNOSIS — K2960 Other gastritis without bleeding: Secondary | ICD-10-CM

## 2012-02-04 DIAGNOSIS — R945 Abnormal results of liver function studies: Secondary | ICD-10-CM

## 2012-02-04 DIAGNOSIS — A0472 Enterocolitis due to Clostridium difficile, not specified as recurrent: Secondary | ICD-10-CM

## 2012-02-04 MED ORDER — DICYCLOMINE HCL 10 MG PO CAPS
10.0000 mg | ORAL_CAPSULE | Freq: Three times a day (TID) | ORAL | Status: DC
  Administered 2012-02-04 – 2012-02-05 (×2): 10 mg via ORAL
  Filled 2012-02-04 (×2): qty 1

## 2012-02-04 MED ORDER — PANTOPRAZOLE SODIUM 40 MG PO TBEC
40.0000 mg | DELAYED_RELEASE_TABLET | Freq: Two times a day (BID) | ORAL | Status: DC
  Administered 2012-02-04 – 2012-02-05 (×2): 40 mg via ORAL
  Filled 2012-02-04 (×2): qty 1

## 2012-02-04 NOTE — Progress Notes (Signed)
[facility]             Progress Note   Subjective: Patient had significant worsening of epigastric pain after eating dinner last night.  This morning she is feeling a little better.  She had 2 loose bowel movements yesterday. No nausea or vomiting.  Objective: Vital signs in last 24 hours: Temp:  [97.5 F (36.4 C)-98.3 F (36.8 C)] 97.8 F (36.6 C) (01/29 0510) Pulse Rate:  [56-65] 60  (01/29 0510) Resp:  [16-18] 18  (01/29 0510) BP: (106-123)/(62-71) 106/71 mmHg (01/29 0510) SpO2:  [95 %-98 %] 95 % (01/29 0510) Weight change:  Last BM Date: 02/03/12  Intake/Output from previous day: 01/28 0701 - 01/29 0700 In: 3661.7 [P.O.:720; I.V.:2941.7] Out: 800 [Urine:800]     Physical Exam: General: Alert, awake, oriented x3, in no acute distress. HEENT: No bruits, no goiter. Heart: Regular rate and rhythm, without murmurs, rubs, gallops. Lungs: Clear to auscultation bilaterally. Abdomen: Soft, tender in epigastric region, nondistended, positive bowel sounds. Extremities: left upper extremity edema Neuro: Grossly intact, nonfocal.    Lab Results: Basic Metabolic Panel:  Basename 02/03/12 0648 02/02/12 0611  NA 140 140  K 3.6 3.6  CL 107 108  CO2 27 24  GLUCOSE 98 106*  BUN 3* 5*  CREATININE 0.72 0.68  CALCIUM 8.4 8.1*  MG -- --  PHOS -- --   Liver Function Tests:  [facility] 02/02/12 0611  AST 69*  ALT 82*  ALKPHOS 42  BILITOT 0.5  PROT 6.0  ALBUMIN 3.1*    Basename 02/02/12 0611  LIPASE 14  AMYLASE --   No results found for this basename: AMMONIA:2 in the last 72 hours CBC:  Basename 02/03/12 0648  WBC 6.2  NEUTROABS --  HGB 13.3  HCT 39.5  MCV 89.2  PLT 227   Cardiac Enzymes: No results found for this basename: CKTOTAL:3,CKMB:3,CKMBINDEX:3,TROPONINI:3 in the last 72 hours BNP: No results found for this basename: PROBNP:3 in the last 72 hours D-Dimer: No results found for this basename: DDIMER:2 in the last 72 hours CBG: No  results found for this basename: GLUCAP:6 in the last 72 hours Hemoglobin A1C: No results found for this basename: HGBA1C in the last 72 hours Fasting Lipid Panel: No results found for this basename: CHOL,HDL,LDLCALC,TRIG,CHOLHDL,LDLDIRECT in the last 72 hours Thyroid Function Tests: No results found for this basename: TSH,T4TOTAL,FREET4,T3FREE,THYROIDAB in the last 72 hours Anemia Panel: No results found for this basename: VITAMINB12,FOLATE,FERRITIN,TIBC,IRON,RETICCTPCT in the last 72 hours Coagulation: No results found for this basename: LABPROT:2,INR:2 in the last 72 hours Urine Drug Screen: Drugs of Abuse  No results found for this basename: labopia,  cocainscrnur,  labbenz,  amphetmu,  thcu,  labbarb    Alcohol Level: No results found for this basename: ETH:2 in the last 72 hours Urinalysis:  Basename 02/03/12 1403  COLORURINE YELLOW  LABSPEC 1.020  PHURINE 5.5  GLUCOSEU NEGATIVE  HGBUR NEGATIVE  BILIRUBINUR NEGATIVE  KETONESUR NEGATIVE  PROTEINUR NEGATIVE  UROBILINOGEN 0.2  NITRITE NEGATIVE  LEUKOCYTESUR NEGATIVE    Recent Results (from the past 240 hour(s))  CULTURE, BLOOD (ROUTINE X 2)     Status: Normal (Preliminary result)   Collection Time   01/31/12  3:45 PM      Component Value Range Status Comment   Specimen Description BLOOD LEFT ANTECUBITAL   Final    Special Requests BOTTLES DRAWN AEROBIC AND ANAEROBIC 4CC   Final    Culture NO GROWTH 4 DAYS   Final  Report Status PENDING   Incomplete   CULTURE, BLOOD (ROUTINE X 2)     Status: Normal   Collection Time   01/31/12  3:45 PM      Component Value Range Status Comment   Specimen Description BLOOD RIGHT ANTECUBITAL   Final    Special Requests BOTTLES DRAWN AEROBIC AND ANAEROBIC 4CC   Final    Culture  Setup Time 02/01/2012 23:05   Final    Culture     Final    Value: STAPHYLOCOCCUS SPECIES (COAGULASE NEGATIVE)     Note: THE SIGNIFICANCE OF ISOLATING THIS ORGANISM FROM A SINGLE VENIPUNCTURE CANNOT BE  PREDICTED WITHOUT FURTHER CLINICAL AND CULTURE CORRELATION. SUSCEPTIBILITIES AVAILABLE ONLY ON REQUEST.     Note: Gram Stain Report Called to,Read Back By and Verified With: MORRIS C. AT 16:50 [id number] BY PRUITT C.   Report Status 02/04/2012 FINAL   Final   STOOL CULTURE     Status: Normal (Preliminary result)   Collection Time   01/31/12  9:31 PM      Component Value Range Status Comment   Specimen Description STOOL   Final    Special Requests NONE   Final    Culture Culture reincubated for better growth   Final    Report Status PENDING   Incomplete   OVA AND PARASITE EXAMINATION     Status: Normal   Collection Time   01/31/12  9:31 PM      Component Value Range Status Comment   Specimen Description STOOL   Final    Special Requests NONE   Final    Ova and parasites NO OVA OR PARASITES SEEN   Final    Report Status 02/03/2012 FINAL   Final   CLOSTRIDIUM DIFFICILE BY PCR     Status: Abnormal   Collection Time   01/31/12  9:31 PM      Component Value Range Status Comment   C difficile by pcr POSITIVE (*) NEGATIVE Final     Studies/Results: Dg Abd 2 Views  02/16/12  *RADIOLOGY REPORT*  Clinical Data: Abdominal pain and vomiting.  ABDOMEN - 2 VIEW  Comparison: CT scan 01/31/2012.  Findings: There is scattered air in the colon and small bowel with scattered air fluid levels.  No findings for obstruction and/or perforation.  The soft tissue shadows are maintained.  The bony structures are intact.  IMPRESSION: Nonspecific bowel gas pattern.  Possible ileus or gastroenteritis. No findings for obstruction or perforation.   Original Report Authenticated By: Marijo Sanes, M.D.     Medications: Scheduled Meds:    . allopurinol  300 mg Oral Daily  . dicyclomine  20 mg Oral TID AC  . gi cocktail  30 mL Oral TID  . heparin  5,000 Units Subcutaneous Q8H  . metroNIDAZOLE  500 mg Oral Q8H  . pantoprazole  40 mg Oral BID  . saccharomyces boulardii  250 mg Oral BID  . sodium chloride  3 mL  Intravenous Q12H  . vancomycin  1,500 mg Intravenous Q12H   Continuous Infusions:   PRN Meds:.acetaminophen, morphine injection, ondansetron (ZOFRAN) IV, ondansetron, promethazine  Assessment/Plan:  Principal Problem:  *C. difficile enteritis Active Problems:  FATTY LIVER DISEASE  Morbid obesity  Polycystic ovary disease  Tobacco abuse  Elevated LFTs  Plan:  1. Clostridium difficile enteritis. Diarrhea improved. Continue Flagyl and probiotics. Advance diet as tolerated. WBC count is normal and she is afebrile. 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  2. Abdominal pain.  Possibly related to #1.  Lipase and lfts unremarkable. Felt some relief with gi cocktail, protonix.  Started on bentyl. Since she is having continued pain after eating, will ask GI to see regarding need for endoscopy.  3. Left upper extremity edema.  Possibly related to IV in same arm.  Will check venous dopplers.  2. Elevated LFTs with fatty liver disease. Follow up with GI as an outpatient. LFTs are at baseline  3. Hypokalemia, replace  4. Positive blood culture for coagulase negative staph 1/2.  Likely contaminant.  Patient does not appear to be toxic or septic. Will discontinue vancomycin.  Dispo.  Probably home tomorrow if continues to improve.  Time spent coordinating care: 25mns   LOS: 4 days   Jorge Amparo [facility] Pager: [phone]/29/2014, 10:54 AM

## 2012-02-04 NOTE — Consult Note (Signed)
Reason for Consult:epigastric pain Referring Physician: [name] is an 27 y.o. female.  HPI: Admitted thru the ED Saturday with c/o of nausea, vomiting, and diarrhea, and norovirus.  Her symptoms started Saturday morning. She says she became dehydrated.Shne was orthostatic in the ED.Marland Kitchen She had not been on any antibiotics.  She is an ED RN.  She has a positive c-diff. She also c/o of a burning, cramping epigastric pain. She says she was served spaghetti at dinner last night and she began to have epigastric pain after eating.  Fever associated with symptoms (101). She denies simiilar episodes of epigastric pain.  No diarrhea since yesterday. She is on Priotonix IV BID. No vomiting in 2 days.  Last BM yesterday and was watery. She tells me she is 60% better.   Past Medical History  Diagnosis Date  . Fibrocystic breast disease   . Acid reflux   . Gout   . Polycystic ovary syndrome   . Fatty liver   . Diabetes mellitus   . Renal disorder     kidney stones    Past Surgical History  Procedure Date  . Cholecystectomy 01/10/2011    Procedure: LAPAROSCOPIC CHOLECYSTECTOMY;  Surgeon: Jamesetta So;  Location: [facility];  Service: General;  Laterality: N/A;  . Liver biopsy 01/10/2011    Procedure: LIVER BIOPSY;  Surgeon: Jamesetta So;  Location: [facility];  Service: General;;    Family History  Problem Relation Age of Onset  . Diabetes Father   . Hypertension Father   . Stroke Other   . Cancer Maternal Grandfather     unclear  . Cancer Paternal Grandfather     Social History:  reports that she has quit smoking. Her smoking use included Cigarettes. She smoked .5 packs per day. She quit smokeless tobacco use about 7 months ago. She reports that she drinks alcohol. She reports that she does not use illicit drugs.  Allergies:  Allergies  Allergen Reactions  . Diovan (Valsartan) Swelling    Nose   . Influenza Vaccine Live Swelling    Arm     Medications: I have reviewed the  patient's current medications.  Results for orders placed during the hospital encounter of 01/31/12 (from the past 48 hour(s))  BASIC METABOLIC PANEL     Status: Abnormal   Collection Time   02/03/12  6:48 AM      Component Value Range Comment   Sodium 140  135 - 145 mEq/L    Potassium 3.6  3.5 - 5.1 mEq/L    Chloride 107  96 - 112 mEq/L    CO2 27  19 - 32 mEq/L    Glucose, Bld 98  70 - 99 mg/dL    BUN 3 (*) 6 - 23 mg/dL    Creatinine, Ser 0.72  0.50 - 1.10 mg/dL    Calcium 8.4  8.4 - 10.5 mg/dL    GFR calc non Af Amer >90  >90 mL/min    GFR calc Af Amer >90  >90 mL/min   CBC     Status: Normal   Collection Time   02/03/12  6:48 AM      Component Value Range Comment   WBC 6.2  4.0 - 10.5 K/uL    RBC 4.43  3.87 - 5.11 MIL/uL    Hemoglobin 13.3  12.0 - 15.0 g/dL    HCT 39.5  36.0 - 46.0 %    MCV 89.2  78.0 - 100.0 fL  MCH 30.0  26.0 - 34.0 pg    MCHC 33.7  30.0 - 36.0 g/dL    RDW 13.0  11.5 - 15.5 %    Platelets 227  150 - 400 K/uL   VANCOMYCIN, TROUGH     Status: Normal   Collection Time   02/03/12  6:52 AM      Component Value Range Comment   Vancomycin Tr 10.8  10.0 - 20.0 ug/mL   URINALYSIS, ROUTINE W REFLEX MICROSCOPIC     Status: Normal   Collection Time   02/03/12  2:03 PM      Component Value Range Comment   Color, Urine YELLOW  YELLOW    APPearance CLEAR  CLEAR    Specific Gravity, Urine 1.020  1.005 - 1.030    pH 5.5  5.0 - 8.0    Glucose, UA NEGATIVE  NEGATIVE mg/dL    Hgb urine dipstick NEGATIVE  NEGATIVE    Bilirubin Urine NEGATIVE  NEGATIVE    Ketones, ur NEGATIVE  NEGATIVE mg/dL    Protein, ur NEGATIVE  NEGATIVE mg/dL    Urobilinogen, UA 0.2  0.0 - 1.0 mg/dL    Nitrite NEGATIVE  NEGATIVE    Leukocytes, UA NEGATIVE  NEGATIVE MICROSCOPIC NOT DONE ON URINES WITH NEGATIVE PROTEIN, BLOOD, LEUKOCYTES, NITRITE, OR GLUCOSE <1000 mg/dL.    Dg Abd 2 Views  02/02/2012  *RADIOLOGY REPORT*  Clinical Data: Abdominal pain and vomiting.  ABDOMEN - 2 VIEW  Comparison:  CT scan 01/31/2012.  Findings: There is scattered air in the colon and small bowel with scattered air fluid levels.  No findings for obstruction and/or perforation.  The soft tissue shadows are maintained.  The bony structures are intact.  IMPRESSION: Nonspecific bowel gas pattern.  Possible ileus or gastroenteritis. No findings for obstruction or perforation.   Original Report Authenticated By: Marijo Sanes, M.D.     ROS Blood pressure 106/71, pulse 60, temperature 97.8 F (36.6 C), temperature source Oral, resp. rate 18, height 5' 9"  (1.753 m), weight 303 lb (137.44 kg), SpO2 95.00%. Physical ExamAlert and oriented. Skin warm and dry. Oral mucosa is moist.   . Sclera anicteric, conjunctivae is pink. Thyroid not enlarged. No cervical lymphadenopathy. Lungs clear. Heart regular rate and rhythm.  Abdomen is soft, obese. Bowel sounds are positive. No hepatomegaly. No abdominal masses felt. Epigastric tenderness  No edema to lower extremities.   Assessment/Plan: GERD. Symptoms worse with spicy foods.  Symptoms started 4 days ago.  No pain this am with breakfast. Will switch patient to po Protonix.  Dr. Laural Golden will be in later. SETZER,TERRI W 02/04/2012, 10:25 AM     GI attending note; Patient interviewed and examined. Lab studies and CT reviewed. #1. Patient's epigastric pain would appear to be part and parcel of her acute illness secondary to gastroenteritis and colitis.(nor colitis and C. Difficile). She is tolerating metronidazole without any side effects. She feels better today. She has been on chronic PPI therapy for GERD and she rarely takes NSAIDs. Therefore I doubt that she has peptic ulcer disease. Vomiting yesterday possibly triggered with fatty meal(began the end meatball). Patient's can experience acute gastroparesis is with wider infection. If she remains with significant pain we will consider EGD before she goes home. #2. Mildly elevated transaminases dating back to 2008. She had  liver biopsy at the time of laparoscopic cholecystectomy one year ago and noted to have mild steatohepatitis but no fibrosis. She is trying to improve her lifestyle and lose weight so that  this would not become an issue down the road. Would recommend dropping dicyclomine to 10 mg before each meal. Asian will be evaluated in a.m.

## 2012-02-04 NOTE — Care Management Note (Signed)
    Page 1 of 1   02/04/2012     3:00:52 PM   CARE MANAGEMENT NOTE 02/04/2012  Patient:  Cheryl Black, Cheryl Black   Account Number:  [id number]  Date Initiated:  02/04/2012  Documentation initiated by:  Rosemary Holms  Subjective/Objective Assessment:   Pt admitted from home. Plans to dc back to home. No anticipated HH needs.     Action/Plan:   Anticipated DC Date:  02/05/2012   Anticipated DC Plan:  HOME/SELF CARE      DC Planning Services  CM consult      Choice offered to / List presented to:             Status of service:  Completed, signed off Medicare Important Message given?   (If response is "NO", the following Medicare IM given date fields will be blank) Date Medicare IM given:   Date Additional Medicare IM given:    Discharge Disposition:  HOME/SELF CARE  Per UR Regulation:    If discussed at Long Length of Stay Meetings, dates discussed:    Comments:  02/04/12 Rosemary Holms RN BSN CM

## 2012-02-05 LAB — CULTURE, BLOOD (ROUTINE X 2): Culture: NO GROWTH

## 2012-02-05 LAB — STOOL CULTURE

## 2012-02-05 MED ORDER — METRONIDAZOLE 500 MG PO TABS: 500.0000 mg | ORAL_TABLET | Freq: Three times a day (TID) | ORAL | Status: DC

## 2012-02-05 MED ORDER — DICYCLOMINE HCL 10 MG PO CAPS: 10.0000 mg | ORAL_CAPSULE | Freq: Three times a day (TID) | ORAL | Status: DC | PRN

## 2012-02-05 NOTE — Progress Notes (Signed)
Patient given discharge instructions with no questions. Patient acknowledged importance and correct precautions regarding her c-diff infection. Prescriptions given. Patient asked to ambulate out of facility with staff.

## 2012-02-05 NOTE — Discharge Summary (Signed)
Physician Discharge Summary  Cheryl Black:[id number] DOB: Apr 06, 1985 DOA: 01/31/2012  PCP: Wende Neighbors, MD  Admit date: 01/31/2012 Discharge date: 02/05/2012  Time spent: 40 minutes  Recommendations for Outpatient Follow-up:  1. Follow up with primary care doctor in 2 weeks  Discharge Diagnoses:  Principal Problem:  *C. difficile enteritis Active Problems:  FATTY LIVER DISEASE  Morbid obesity  Polycystic ovary disease  Tobacco abuse  Elevated LFTs   Discharge Condition: improved  Diet recommendation: low residue, low salt, low carb  Filed Weights   01/31/12 0858  Weight: 137.44 kg (303 lb)    History of present illness:  Cheryl Black is a 27 y.o. female who presented to [facility] 01/31/12 with above c/o. SHe states she awke about 04:00 and staretd having epiosdesof vomtiing-she developed soft BM's this am and then proceeded to have diarrhea-didn;t eat anywhere out of the ordinary-she did eat a salad and a chien sandwich on going to [employer] who ate soething simlar didn't have any symptoms of this however. She states that she last worked  [employer] she may have vomited 10-12 times. The stools she had were brown initally and then turned yellow. THe last stool was watery and yellow. She has had pain in RUQ as well =as epigastric and awoke her. Hasn't eaten anything since all this happened.  She states she has lost close to 20lbs severe an effort to control her rate and fatty liver  SHe has had symptoms similar to this in Nov 2013  seent he tueasday priro to xmas Bronchitis and the flu    Hospital Course:  This patient was admitted to the hospital with severe vomiting, diarrhea and dehydration. 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 Workup revealed positive Clostridium difficile in her stool. No virus was also found in her stool. Patient was started on treatment with oral Flagyl. Over the course of her hospital stay her symptoms have improved.  She's not having formed stools. She's not had any vomiting and her abdominal pain has improved.  Patient did have one out of 2 blood cultures positive for coagulase negative staph. She did not have any fever or elevated WBC count. Clinically she does not appear toxic. This was felt to be contamination. She was briefly placed on vancomycin until culture sensitivities were available. Since this is felt to be contamination, we will discontinue vancomycin.  The patient did have some swelling in her left upper extremity. Venous Dopplers did not reveal any DVT. This is secondary to a superficial thrombophlebitis from her IV site.  Patient was having significant abdominal pain during her hospital stay. As epigastric in nature and worse after eating. She was seen by gastroenterology for consideration of endoscopy. Her symptoms have since improved and she is tolerating a regular diet without any problems. Endoscopy can be deferred to outpatient setting if necessary.  Patient is stable for discharge home.  Procedures:  none  Consultations:  Gastroenterology, Dr. Laural Golden  Discharge Exam: Filed Vitals:   02/04/12 0510 02/04/12 1506 02/04/12 2200 02/05/12 0628  BP: 106/71 82/65 112/70 133/84  Pulse: 60 66 61 54  Temp: 97.8 F (36.6 C) 98.2 F (36.8 C) 97.7 F (36.5 C) 97.8 F (36.6 C)  TempSrc: Oral Oral Oral Oral  Resp: 18 18 20 20   Height:      Weight:      SpO2: 95% 97% 96% 96%    General: NAD Cardiovascular: S1, S2, RRR Respiratory: CTA B  Discharge Instructions  Discharge Orders    Future Orders Please Complete By Expires   Diet - low sodium heart healthy      Increase activity slowly      Call MD for:  temperature >100.4      Call MD for:  persistant nausea and vomiting      Call MD for:  severe uncontrolled pain          Medication List     As of 02/05/2012 10:42 AM    TAKE these medications         allopurinol 300 MG tablet   Commonly known as: ZYLOPRIM   Take 300  mg by mouth daily.      dicyclomine 10 MG capsule   Commonly known as: BENTYL   Take 1 capsule (10 mg total) by mouth 3 (three) times daily as needed (abdominal cramps).      JENTADUETO 2.5-500 MG Tabs   Generic drug: Linagliptin-Metformin HCl   Take 1 tablet by mouth daily.      metroNIDAZOLE 500 MG tablet   Commonly known as: FLAGYL   Take 1 tablet (500 mg total) by mouth every 8 (eight) hours.      multivitamin with minerals Tabs   Take 2 tablets by mouth daily.      omeprazole 20 MG capsule   Commonly known as: PRILOSEC   Take 20 mg by mouth daily.      ondansetron 8 MG tablet   Commonly known as: ZOFRAN   Take 8 mg by mouth every 8 (eight) hours as needed. For nausea      pravastatin 20 MG tablet   Commonly known as: PRAVACHOL   Take 20 mg by mouth daily.      valsartan-hydrochlorothiazide 160-25 MG per tablet   Commonly known as: DIOVAN-HCT   Take 1 tablet by mouth daily.           Follow-up Information    Follow up with [facility], [place]. Schedule an appointment as soon as possible for a visit in 2 weeks.   Contact information:   [address] [place] [address] [phone]           The results of significant diagnostics from this hospitalization (including imaging, microbiology, ancillary and laboratory) are listed below for reference.    Significant Diagnostic Studies: Ct Abdomen Pelvis W Contrast  01/31/2012  *RADIOLOGY REPORT*  Clinical Data: Nausea, vomiting, right-sided pain  CT ABDOMEN AND PELVIS WITH CONTRAST  Technique:  Multidetector CT imaging of the abdomen and pelvis was performed following the standard protocol during bolus administration of intravenous contrast.  Contrast: 40m OMNIPAQUE IOHEXOL 300 MG/ML  SOLN, 1088mOMNIPAQUE IOHEXOL 300 MG/ML  SOLN  Comparison: CT scan 04/10/09  Findings: Sagittal images of the spine are unremarkable.  Lung bases are unremarkable.  Mild hepatic fatty infiltration.  Status post cholecystectomy.  The  pancreas, spleen and adrenal glands are unremarkable.  Kidneys are symmetrical in size and enhancement.  No hydronephrosis or hydroureter.  No aortic aneurysm.  No small bowel obstruction.  No ascites or free air.  No adenopathy.  No pericecal inflammation.  Normal appendix is clearly visualized in axial image 64.  The uterus and adnexa are unremarkable.  The ovaries are unremarkable.  No pelvic ascites or adenopathy.  Small nonspecific bilateral inguinal lymph nodes are noted.  IMPRESSION: 1.  No acute inflammatory process within abdomen or pelvis. 2.  No hydronephrosis or hydroureter. 3.  No pericecal inflammation.  Normal appendix is clearly visualized. 4.  Mild hepatic fatty infiltration.   Original Report Authenticated By: Lahoma Crocker, M.D.    US Venous Img Upper Uni Left  02/04/2012  *RADIOLOGY REPORT*  Clinical Data:  Left arm pain and swelling, existing IV site in the left elbow.  LEFT UPPER EXTREMITY VENOUS DUPLEX ULTRASOUND  Technique:  Gray-scale sonography with graded compression, as well as color Doppler and duplex ultrasound were performed to evaluate the upper extremity deep venous system from the level of the subclavian vein and including the jugular, axillary, basilic and upper cephalic vein.  Spectral Doppler was utilized to evaluate flow at rest and with distal augmentation maneuvers.  Comparison:  None.  Findings:  Normal compressibility of the upper extremity deep veins is demonstrated.  No venous filling defects visualized on grayscale or color Doppler US.  Normal direction of flow is seen throughout the deep veins.  Spectral Doppler waveforms show normal morphology at rest and with distal augmentation.  At the existing IV site in the elbow region, there is a peripheral IV visualized within the lumen of the cephalic vein.  Along the intravenous catheter, there is a small amount of nonocclusive thrombus.  This is mildly tender during the exam.  Findings compatible with superficial  thrombophlebitis at the peripheral IV site.  IMPRESSION: No evidence of upper extremity deep venous thrombosis.  Superficial thrombophlebitis at the peripheral IV site within the cephalic vein.   Original Report Authenticated By: Jerilynn Mages. Annamaria Boots, M.D.    Dg Abd 2 Views  02/02/2012  *RADIOLOGY REPORT*  Clinical Data: Abdominal pain and vomiting.  ABDOMEN - 2 VIEW  Comparison: CT scan 01/31/2012.  Findings: There is scattered air in the colon and small bowel with scattered air fluid levels.  No findings for obstruction and/or perforation.  The soft tissue shadows are maintained.  The bony structures are intact.  IMPRESSION: Nonspecific bowel gas pattern.  Possible ileus or gastroenteritis. No findings for obstruction or perforation.   Original Report Authenticated By: Marijo Sanes, M.D.     Microbiology: Recent Results (from the past 240 hour(s))  CULTURE, BLOOD (ROUTINE X 2)     Status: Normal (Preliminary result)   Collection Time   01/31/12  3:45 PM      Component Value Range Status Comment   Specimen Description BLOOD LEFT ANTECUBITAL   Final    Special Requests BOTTLES DRAWN AEROBIC AND ANAEROBIC 4CC   Final    Culture NO GROWTH 4 DAYS   Final    Report Status PENDING   Incomplete   CULTURE, BLOOD (ROUTINE X 2)     Status: Normal   Collection Time   01/31/12  3:45 PM      Component Value Range Status Comment   Specimen Description BLOOD RIGHT ANTECUBITAL   Final    Special Requests BOTTLES DRAWN AEROBIC AND ANAEROBIC 4CC   Final    Culture  Setup Time 02/01/2012 23:05   Final    Culture     Final    Value: STAPHYLOCOCCUS SPECIES (COAGULASE NEGATIVE)     Note: THE SIGNIFICANCE OF ISOLATING THIS ORGANISM FROM A SINGLE VENIPUNCTURE CANNOT BE PREDICTED WITHOUT FURTHER CLINICAL AND CULTURE CORRELATION. SUSCEPTIBILITIES AVAILABLE ONLY ON REQUEST.     Note: Gram Stain Report Called to,Read Back By and Verified With: MORRIS C. AT 16:50 [id number] BY PRUITT C.   Report Status 02/04/2012 FINAL   Final    STOOL CULTURE     Status: Normal   Collection Time  01/31/12  9:31 PM      Component Value Range Status Comment   Specimen Description STOOL   Final    Special Requests NONE   Final    Culture     Final    Value: NO SALMONELLA, SHIGELLA, CAMPYLOBACTER, YERSINIA, OR E.COLI 0157:H7 ISOLATED   Report Status 02/05/2012 FINAL   Final   OVA AND PARASITE EXAMINATION     Status: Normal   Collection Time   01/31/12  9:31 PM      Component Value Range Status Comment   Specimen Description STOOL   Final    Special Requests NONE   Final    Ova and parasites NO OVA OR PARASITES SEEN   Final    Report Status 02/03/2012 FINAL   Final   CLOSTRIDIUM DIFFICILE BY PCR     Status: Abnormal   Collection Time   01/31/12  9:31 PM      Component Value Range Status Comment   C difficile by pcr POSITIVE (*) NEGATIVE Final      Labs: Basic Metabolic Panel:  Lab 29/02/11 0648 02/02/12 0611 02/01/12 0625 01/31/12 0909  NA 140 140 134* 134*  K 3.6 3.6 3.4* 4.2  CL 107 108 101 102  CO2 27 24 27 19   GLUCOSE 98 106* 96 162*  BUN 3* 5* 6 9  CREATININE 0.72 0.68 0.77 0.70  CALCIUM 8.4 8.1* 8.0* 9.5  MG -- -- -- --  PHOS -- -- -- --   Liver Function Tests:  Lab 02/02/12 0611 02/01/12 0625 01/31/12 0909  AST 69* 39* 55*  ALT 82* 61* 92*  ALKPHOS 42 46 70  BILITOT 0.5 1.0 0.8  PROT 6.0 6.0 7.7  ALBUMIN 3.1* 3.0* 3.9    Lab 02/02/12 0611 01/31/12 0909  LIPASE 14 21  AMYLASE -- --   No results found for this basename: AMMONIA:5 in the last 168 hours CBC:  Lab 02/03/12 0648 02/01/12 0625 01/31/12 0909  WBC 6.2 5.6 16.6*  NEUTROABS -- -- 14.8*  HGB 13.3 13.7 17.1*  HCT 39.5 40.9 48.6*  MCV 89.2 90.7 87.1  PLT 227 210 354   Cardiac Enzymes: No results found for this basename: CKTOTAL:5,CKMB:5,CKMBINDEX:5,TROPONINI:5 in the last 168 hours BNP: BNP (last 3 results) No results found for this basename: PROBNP:3 in the last 8760 hours CBG:  Lab 02/01/12 0753  GLUCAP 95        Signed:  Urbano Milhouse  [facility] 02/05/2012, 10:42 AM

## 2012-05-12 ENCOUNTER — Ambulatory Visit (HOSPITAL_COMMUNITY)
Admission: RE | Admit: 2012-05-12 | Discharge: 2012-05-12 | Disposition: A | Discharge status: Home / Self Care | Payer: 59 | Source: Ambulatory Visit | Attending: General Surgery | Admitting: General Surgery

## 2012-05-12 DIAGNOSIS — IMO0001 Reserved for inherently not codable concepts without codable children: Secondary | ICD-10-CM | POA: Insufficient documentation

## 2012-05-12 DIAGNOSIS — Z4689 Encounter for fitting and adjustment of other specified devices: Secondary | ICD-10-CM | POA: Insufficient documentation

## 2012-05-12 NOTE — Progress Notes (Signed)
  Patient Details  Name: Cheryl Black MRN: [id number] Date of Birth: 02-15-1985  Today's Date: 05/12/2012 Time:   4098-1191  Cheryl Black has been referred to occupational therapy for a fitting for a splint for post surgery for hidradenitis.  She is scheduled for surgery in the first part of June for her right arm and will be having the same surgery for her left arm later this year.  Dr. Malvin Johns would like for the splint to not put any pressure on her axillary area, nor cover this area. In order for a splint to provide sufficient support, it would need to cover this area.  I recommended to the patient and to Dr. Malvin Johns the [facility] abduction Pillow.  It will position her shoulder in 70 degrees of abduction, and can be used on either arm.   Dr. Malvin Johns agreed to this.  I will order the pillow today.  When the pillow has been received, I will schedule a fitting/evaluation with the patient. No charge for today's visit.  Shirlean Mylar, OTR/L  05/12/2012, 12:07 PM

## 2012-05-21 ENCOUNTER — Ambulatory Visit (HOSPITAL_COMMUNITY)
Admission: RE | Admit: 2012-05-21 | Discharge: 2012-05-21 | Disposition: A | Discharge status: Home / Self Care | Payer: 59 | Source: Ambulatory Visit | Attending: General Surgery | Admitting: General Surgery

## 2012-05-21 NOTE — Progress Notes (Signed)
Occupational Therapy Treatment Patient Details  Name: Cheryl Black MRN: [id number] Date of Birth: [birth year]  Today's Date: 05/21/2012 Time: 4098-1191 OT Time Calculation (min): 30 min selfcare 30' Visit#: 1 of 1  Subjective  S:  I am going to have my sweat glands removed under my right arm and then my left arm. Pertinent History: Dr. Malvin Johns referred patient to OT for a one time visit for fitting of a splint or support that will position her right arm in 70 degrees of abduction, allowing her axillary region to be open to allow for skin graft to heal.  Patient and I discussed possible options of an airplane splint or an abduction pillow that will position patient in 70 degrees of abduction.  We decided that abduction pillow would be more comfortable and cost effective as she will be able to use it for her right and left surgery.  I followed up the use of the abduction pillow and Dr. Malvin Johns was in agreeance with the use of the pillow.    Precautions/Restrictions   n/a  Exercise/Treatments Activities of Daily Living Activities of Daily Living: Patient presented today for instruction and fitting of shoulder abduction pillow for use on her right and then left shoulder.  Patient brought her husband with her to the visit so that he could be instructed on how to assist with donning.  We made approprate modifications to all strapping to insure that shoulder was positioned in 70 degrees of abduction and pillow was not pressing on her axillary area.  Patient feels comfortable with use of pillow.  Paitent will bring pillow to her follow up appointment with Dr. Malvin Johns and will contact me afterwards if any modifications need done.   Occupational Therapy Assessment and Plan OT Assessment and Plan Clinical Impression Statement: A:  Abduction pillow positions patient in 70 abduction, patient feels comfortable with use of pillow.   OT Plan: P:  Follow up as needed.     Goals    Problem List Patient  Active Problem List   Diagnosis Date Noted  . C. difficile enteritis 02/01/2012  . Hyponatremia 01/07/2011  . Hypokalemia 01/07/2011  . Elevated LFTs 01/07/2011  . Hypomagnesemia 01/07/2011  . Diabetes mellitus type 2 in obese 01/06/2011  . Morbid obesity 01/06/2011  . Polycystic ovary disease 01/06/2011  . Tobacco abuse 01/06/2011  . FATTY LIVER DISEASE 06/08/2008  . TRANSAMINASES, SERUM, ELEVATED 06/08/2008    End of Session Activity Tolerance: Patient tolerated treatment well General Behavior During Therapy: [facility] for tasks assessed/performed Cognition: WFL for tasks performed  GO    Jacqualine Code 05/21/2012, 12:05 PM

## 2012-06-08 ENCOUNTER — Encounter (HOSPITAL_COMMUNITY): Payer: Self-pay | Admitting: Pharmacy Technician

## 2012-06-14 ENCOUNTER — Encounter (HOSPITAL_COMMUNITY)
Admission: RE | Admit: 2012-06-14 | Discharge: 2012-06-14 | Disposition: A | Discharge status: Home / Self Care | Payer: 59 | Source: Ambulatory Visit | Attending: General Surgery | Admitting: General Surgery

## 2012-06-14 ENCOUNTER — Encounter (HOSPITAL_COMMUNITY): Payer: Self-pay

## 2012-06-14 HISTORY — DX: Other specified postprocedural states: Z98.890

## 2012-06-14 HISTORY — DX: Other specified postprocedural states: R11.2

## 2012-06-14 HISTORY — DX: Personal history of urinary calculi: Z87.442

## 2012-06-14 LAB — CBC WITH DIFFERENTIAL/PLATELET
Basophils Absolute: 0 10*3/uL (ref 0.0–0.1)
Basophils Relative: 0 % (ref 0–1)
Eosinophils Absolute: 0.1 10*3/uL (ref 0.0–0.7)
MCH: 30.6 pg (ref 26.0–34.0)
MCHC: 34.7 g/dL (ref 30.0–36.0)
Neutro Abs: 6.5 10*3/uL (ref 1.7–7.7)
Neutrophils Relative %: 72 % (ref 43–77)
RDW: 13 % (ref 11.5–15.5)

## 2012-06-14 LAB — SURGICAL PCR SCREEN
MRSA, PCR: NEGATIVE
Staphylococcus aureus: NEGATIVE

## 2012-06-14 LAB — BASIC METABOLIC PANEL
BUN: 8 mg/dL (ref 6–23)
Calcium: 9.6 mg/dL (ref 8.4–10.5)
Creatinine, Ser: 0.69 mg/dL (ref 0.50–1.10)
GFR calc Af Amer: >90 mL/min (ref >90)
GFR calc non Af Amer: >90 mL/min (ref >90)
Glucose, Bld: 264 mg/dL — ABNORMAL HIGH (ref 70–99)

## 2012-06-14 NOTE — Patient Instructions (Addendum)
    Cheryl Black  06/14/2012   Your procedure is scheduled on: 06/18/2012  Report to [facility] at  615  AM.  Call this number if you have problems the morning of surgery: [phone]   Remember:   Do not eat food or drink liquids after midnight.   Take these medicines the morning of surgery with A SIP OF WATER: zyloprim, prilosec,zofran, diovan   Do not wear jewelry, make-up or nail polish.  Do not wear lotions, powders, or perfumes. You may wear deodorant.  Do not shave 48 hours prior to surgery. Men may shave face and neck.  Do not bring valuables to the hospital.  [facility] is not responsible  for any belongings or valuables.  Contacts, dentures or bridgework may not be worn into surgery.  Leave suitcase in the car. After surgery it may be brought to your room.  For patients admitted to the hospital, checkout time is 11:00 AM the day of discharge.   Patients discharged the day of surgery will not be allowed to drive  home.  Name and phone number of your driver: family  Special Instructions: Shower using CHG 2 nights before surgery and the night before surgery.  If you shower the day of surgery use CHG.  Use special wash - you have one bottle of CHG for all showers.  You should use approximately 1/3 of the bottle for each shower.   Please read over the following fact sheets that you were given: Pain Booklet, Coughing and Deep Breathing, MRSA Information, Surgical Site Infection Prevention, Anesthesia Post-op Instructions and Care and Recovery After Surgery PATIENT INSTRUCTIONS POST-ANESTHESIA  IMMEDIATELY FOLLOWING SURGERY:  Do not drive or operate machinery for the first twenty four hours after surgery.  Do not make any important decisions for twenty four hours after surgery or while taking narcotic pain medications or sedatives.  If you develop intractable nausea and vomiting or a severe headache please notify your doctor immediately.  FOLLOW-UP:  Please make an appointment with  your surgeon as instructed. You do not need to follow up with anesthesia unless specifically instructed to do so.  WOUND CARE INSTRUCTIONS (if applicable):  Keep a dry clean dressing on the anesthesia/puncture wound site if there is drainage.  Once the wound has quit draining you may leave it open to air.  Generally you should leave the bandage intact for twenty four hours unless there is drainage.  If the epidural site drains for more than 36-48 hours please call the anesthesia department.  QUESTIONS?:  Please feel free to call your physician or the hospital operator if you have any questions, and they will be happy to assist you.

## 2012-06-15 ENCOUNTER — Ambulatory Visit (HOSPITAL_COMMUNITY)
Admission: RE | Admit: 2012-06-15 | Discharge: 2012-06-15 | Disposition: A | Discharge status: Home / Self Care | Payer: 59 | Source: Ambulatory Visit | Attending: Internal Medicine | Admitting: Internal Medicine

## 2012-06-15 DIAGNOSIS — Z4689 Encounter for fitting and adjustment of other specified devices: Secondary | ICD-10-CM | POA: Insufficient documentation

## 2012-06-15 DIAGNOSIS — IMO0001 Reserved for inherently not codable concepts without codable children: Secondary | ICD-10-CM | POA: Insufficient documentation

## 2012-06-15 NOTE — Progress Notes (Signed)
Occupational Therapy Treatment Patient Details  Name: Cheryl Black MRN: [id number] Date of Birth: 23-Feb-1985  Today's Date: 06/15/2012 Time: 0800-0920 OT Time Calculation (min): 80 min Splinting 80' Visit#: 2 of 4  Re-eval: 07/13/12     Subjective S:  Dr. Romona Curls thinks the pillow splint will be good for me to use after the graft has healed some.  Right after surgery he wants my arm up over my head. Pain Assessment Currently in Pain?: No/denies   Exercise/Treatments Splinting:   OTR/L fabricated a long arm, volar based splint that supports the wrist in slight extension, elbow extension.  Digits free for movement.   Second splint was fabricated over patients right trunk that extended to her mid upper arm, positioning her shoulder in approximately 150 degrees of abduction, while allowing for ample space for her bandaging and not applying pressure to her axillary region.  Patient instructed on donning and doffing of splint (don arm splint, don axillary splint, strap, wrap axillary splint around body with ace bandages x 2 for extra support, cleaning, and contraindications.    Occupational Therapy Assessment and Plan OT Assessment and Plan Clinical Impression Statement: A:  Positioned patients arm in 150 degrees of abduction with the use of a 2 part splint system, per MD request. Patient educated on donning, doffing, contraindications. Rehab Potential: Good OT Frequency: Min 2X/week OT Duration: 2 weeks OT Treatment/Interventions: Splinting OT Plan: P:  Follow up as needed with splint modifications, per MD orders.   Goals Short Term Goals Time to Complete Short Term Goals: 2 weeks Short Term Goal 1: Airplane splint will be fabricated for patients right shoulder region, positioning patient as MD instructs. Short Term Goal 2: Patient will be able to don and doff splints with assistance from family members.  Problem List Patient Active Problem List   Diagnosis Date Noted  . C.  difficile enteritis 02/01/2012  . Hyponatremia 01/07/2011  . Hypokalemia 01/07/2011  . Elevated LFTs 01/07/2011  . Hypomagnesemia 01/07/2011  . Diabetes mellitus type 2 in obese 01/06/2011  . Morbid obesity 01/06/2011  . Polycystic ovary disease 01/06/2011  . Tobacco abuse 01/06/2011  . FATTY LIVER DISEASE 06/08/2008  . TRANSAMINASES, SERUM, ELEVATED 06/08/2008    End of Session Activity Tolerance: Patient tolerated treatment well General Behavior During Therapy: WFL for tasks assessed/performed Cognition: WFL for tasks performed OT Plan of Care OT Patient Instructions: splint wear and care Consulted and Agree with Plan of Care: Patient   Vangie Bicker, OTR/L  06/15/2012, 4:40 PM

## 2012-06-15 NOTE — Consult Note (Signed)
NA[place]IL[place], Cheryl Black NO.:  [id number]  MEDICAL RECORD NO.:  [id number]  LOCATION:  DOIB                          FACILITY:  [facility]  PHYSICIAN:  Barbaraann Barthel, M.D. DATE OF BIRTH:  10-31-1985  DATE OF CONSULTATION:  06/14/2012 DATE OF DISCHARGE:  06/14/2012                                CONSULTATION   CHIEF COMPLAINT:  Hidradenitis suppurativa of both axillary areas.  HISTORY OF PRESENT MEDICAL ILLNESS:  The patient has had many years of recurrent infection and drainage, and abscesses, and problems in both axilla secondary to hidradenitis suppurativa.  She was referred for surgery.  MEDICAL HISTORY:  She has a history of type 2 diabetes mellitus, history of hypercholesterolemia, history of gout, GERD.  PAST SURGERIES:  Include; a laparoscopic cholecystectomy in 2013, and she had her wisdom teeth removed in 2010.  DRUG ALLERGIES:  She is allergic to __________ and DOXYCYCLINE which causes a rash.  MEDICATIONS:  For her regular medication list, see her medical sheet.  SOCIAL HISTORY:  She is a nonsmoker and a nondrinker.  PHYSICAL EXAMINATION:  VITAL SIGNS:  She is 5 feet 10 inches, weighs 300 pounds.  Temperature is 97.8, pulse rate is 88 per minute, respirations 16, her blood pressure is 100/60. HEENT:  Head is normocephalic.  Eyes, extraocular movements are intact. Pupils are equal and reactive to light and accommodation.  There is no icterus.  There are no scleral injection.  The sclera is a normal tincture.  Nose and oral mucosa are moist. NECK:  Supple without any bruits appreciated, adenopathy, or thyromegaly. CHEST:  Clear both anterior and posterior auscultation. HEART:  Regular rhythm. BREASTS:  Breasts and axillae are without masses.  She has a few scattered areas of that look like scarred from previous folliculitis and her inframammary areas on both sides.  However, this is relieved quite slight. ABDOMEN:  Soft.  She has had a previous  laparoscopic cholecystectomy without evidence of port site hernias.  No femoral or inguinal hernias are appreciated. RECTAL AND PELVIC:  Deferred. EXTREMITIES:  As stated, bilateral hidradenitis suppurativa in the axilla areas and to a lesser extent, in the groin areas.  REVIEW OF SYSTEMS:  NEUROLOGIC:  The patient has no history of migraines or seizures or any lateralizing neurological findings.  ENDOCRINE: History of diabetes type 2.  No history of thyroid disease or adrenal problems.  CARDIOPULMONARY:  History of hypercholesterolemia. MUSCULOSKELETAL:  History of gout and the patient is obese.  OB/GYN: She is a nulliparous patient, gravida 0, para 0, cesarean 0, and abortus 0.  She has on her mother's side several aunts who have had carcinoma of the breast.  She has had polycystic ovarian syndrome.  GI:  She has a history of GERD, no history of hepatitis, constipation, diarrhea, or bright red rectal bleeding.  She had some Clostridium difficile colitis in January.  She has no history of unexplained weight loss.  No history of colonoscopy.  GU:  No history of frequency or dysuria.  The patient has a history of kidney stones.  REVIEW OF HISTORY OF PHYSICAL:  Therefore, [name] is a 27 year old white female who has long term complaints due to  her hidradenitis suppurativa.  She has been on antibiotics and the infection has really subsided quite a bit, and we will plan for an excision and split- thickness skin graft of her right axilla.  We have consulted physical therapy prior to this, so that a type of airplane splint can be device to keep her axilla from rubbing and would allow the skin graft to heal. We will make sure that this is fit properly prior to surgery.  We discussed complications not limited to, but including bleeding and infection and the possibility of further surgery might be required, and the possibility that the skin graft would not take as this is a difficult area.   We will plan the donor site probably from her anterior thigh.  This was reviewed in detail with the patient and all questions were answered, and we will plan for surgery via the outpatient department.     Barbaraann Barthel, M.D.     WB/MEDQ  D:  06/14/2012  T:  06/15/2012  Job:  [id number]  cc:   Catalina Pizza, M.D. Fax: [phone]

## 2012-06-16 ENCOUNTER — Ambulatory Visit (HOSPITAL_COMMUNITY)
Admission: RE | Admit: 2012-06-16 | Discharge: 2012-06-16 | Disposition: A | Discharge status: Home / Self Care | Payer: 59 | Source: Ambulatory Visit | Attending: Internal Medicine | Admitting: Internal Medicine

## 2012-06-16 NOTE — Progress Notes (Signed)
Occupational Therapy Treatment Patient Details  Name: Cheryl Black MRN: [id number] Date of Birth: 1985/07/23  Today's Date: 06/16/2012 Time: 1600-1730 OT Time Calculation (min): 90 min Splinting 90' Visit#: 3 of 4  Re-eval: 07/13/12    Subjective  S:  Dr. Malvin Johns is not pleased with the splint because I can not get in and out of the car.    Exercise/Treatments    Splinting Splinting: OT fabricated a long arm volar based splint that supports the wrist in slight extension, pronated forearm, and elbow flexed 70 degrees.  Second splint was fabricated overh patients right trunk that will be used as a support for her arm.  Dowel rod used to position her arm in 125 degrees of abduction.    Occupational Therapy Assessment and Plan OT Assessment and Plan Clinical Impression Statement: A:  Splint refabricated to new MD specifications.  Adhesive used to secure doweling will cure, and will follow up with fitting on patient 06/17/12. OT Plan: P:  Follow up as needed with splint modifications, per MD orders.   Goals Short Term Goals Time to Complete Short Term Goals: 2 weeks Short Term Goal 1: Airplane splint will be fabricated for patients right shoulder region, positioning patient as MD instructs. Short Term Goal 1 Progress: Progressing toward goal Short Term Goal 2: Patient will be able to don and doff splints with assistance from family members. Short Term Goal 2 Progress: Progressing toward goal  Problem List Patient Active Problem List   Diagnosis Date Noted  . C. difficile enteritis 02/01/2012  . Hyponatremia 01/07/2011  . Hypokalemia 01/07/2011  . Elevated LFTs 01/07/2011  . Hypomagnesemia 01/07/2011  . Diabetes mellitus type 2 in obese 01/06/2011  . Morbid obesity 01/06/2011  . Polycystic ovary disease 01/06/2011  . Tobacco abuse 01/06/2011  . FATTY LIVER DISEASE 06/08/2008  . TRANSAMINASES, SERUM, ELEVATED 06/08/2008    End of Session Activity Tolerance: Patient  tolerated treatment well General Cognition: WFL for tasks performed  GO    Jacqualine Code 06/16/2012, 6:08 PM

## 2012-06-17 ENCOUNTER — Ambulatory Visit (HOSPITAL_COMMUNITY)
Admission: RE | Admit: 2012-06-17 | Discharge: 2012-06-17 | Disposition: A | Discharge status: Home / Self Care | Payer: 59 | Source: Ambulatory Visit | Attending: Internal Medicine | Admitting: Internal Medicine

## 2012-06-17 MED ORDER — SODIUM CHLORIDE 0.9 % IR SOLN
Freq: Once | Status: AC
  Administered 2012-06-18: 09:00:00
  Filled 2012-06-17: qty 1000

## 2012-06-17 NOTE — Progress Notes (Signed)
Occupational Therapy Treatment Patient Details  Name: Cheryl Black MRN: [id number] Date of Birth: March 21, 1985  Today's Date: 06/17/2012 Time: 1430-1500 OT Time Calculation (min): 30 min Splinting 1430-1500 30'  Visit#: 4 of 4  Re-eval: 07/13/12    Authorization:    Authorization Time Period:    Authorization Visit#:   of    Subjective Symptoms/Limitations Symptoms: S: I'm putting my full weight on it and it's not breaking.  Pain Assessment Currently in Pain?: No/denies  Precautions/Restrictions  Precautions Precautions: None  Exercise/Treatments    Splinting Splinting: Patient and mother present for splint fitting. Prior to patient's visit, OT reheated splint where dowel rod is placed on upper and lower splint pieces. Educated patient and mother how to donn splint and use ace bandage to provide increased support. OT applied epoxy to velcro pieces on trunk splint .  Occupational Therapy Assessment and Plan OT Assessment and Plan Clinical Impression Statement: A: Patient and mother present for spint fitting. Education provided regarding donning splint. Patient and mother understood donning instructions.  OT Plan: P: D/C from OT services.    Goals Short Term Goals Time to Complete Short Term Goals: 2 weeks Short Term Goal 1: Airplane splint will be fabricated for patients right shoulder region, positioning patient as MD instructs. Short Term Goal 1 Progress: Met Short Term Goal 2: Patient will be able to don and doff splints with assistance from family members. Short Term Goal 2 Progress: Met  Problem List Patient Active Problem List   Diagnosis Date Noted  . C. difficile enteritis 02/01/2012  . Hyponatremia 01/07/2011  . Hypokalemia 01/07/2011  . Elevated LFTs 01/07/2011  . Hypomagnesemia 01/07/2011  . Diabetes mellitus type 2 in obese 01/06/2011  . Morbid obesity 01/06/2011  . Polycystic ovary disease 01/06/2011  . Tobacco abuse 01/06/2011  . FATTY LIVER  DISEASE 06/08/2008  . TRANSAMINASES, SERUM, ELEVATED 06/08/2008    End of Session Activity Tolerance: Patient tolerated treatment well General Behavior During Therapy: [facility] for tasks assessed/performed Cognition: [facility] for tasks performed   Limmie Patricia, OTR/L,CBIS   06/17/2012, 3:08 PM

## 2012-06-17 NOTE — OR Nursing (Signed)
Glucose performed on preop was 264, reported to Dr. Jayme Cloud, orders to repeat cbg on arrival to preop.

## 2012-06-18 ENCOUNTER — Encounter (HOSPITAL_COMMUNITY): Payer: Self-pay | Admitting: *Deleted

## 2012-06-18 ENCOUNTER — Inpatient Hospital Stay (HOSPITAL_COMMUNITY)
Admission: RE | Admit: 2012-06-18 | Discharge: 2012-06-21 | DRG: 577 | Disposition: A | Discharge status: Home / Self Care | Payer: 59 | Source: Ambulatory Visit | Attending: General Surgery | Admitting: General Surgery

## 2012-06-18 ENCOUNTER — Encounter (HOSPITAL_COMMUNITY): Payer: Self-pay | Admitting: Anesthesiology

## 2012-06-18 ENCOUNTER — Ambulatory Visit (HOSPITAL_COMMUNITY): Payer: 59 | Admitting: Anesthesiology

## 2012-06-18 ENCOUNTER — Encounter (HOSPITAL_COMMUNITY)
Admission: RE | Disposition: A | Discharge status: Home / Self Care | Payer: Self-pay | Source: Ambulatory Visit | Attending: General Surgery

## 2012-06-18 DIAGNOSIS — M109 Gout, unspecified: Secondary | ICD-10-CM | POA: Diagnosis present

## 2012-06-18 DIAGNOSIS — K219 Gastro-esophageal reflux disease without esophagitis: Secondary | ICD-10-CM | POA: Diagnosis present

## 2012-06-18 DIAGNOSIS — Z8639 Personal history of other endocrine, nutritional and metabolic disease: Secondary | ICD-10-CM

## 2012-06-18 DIAGNOSIS — E7800 Pure hypercholesterolemia, unspecified: Secondary | ICD-10-CM | POA: Diagnosis present

## 2012-06-18 DIAGNOSIS — Z862 Personal history of diseases of the blood and blood-forming organs and certain disorders involving the immune mechanism: Secondary | ICD-10-CM

## 2012-06-18 DIAGNOSIS — E669 Obesity, unspecified: Secondary | ICD-10-CM | POA: Diagnosis present

## 2012-06-18 DIAGNOSIS — E119 Type 2 diabetes mellitus without complications: Secondary | ICD-10-CM | POA: Diagnosis present

## 2012-06-18 DIAGNOSIS — L732 Hidradenitis suppurativa: Principal | ICD-10-CM | POA: Diagnosis present

## 2012-06-18 DIAGNOSIS — Z6841 Body Mass Index (BMI) 40.0 and over, adult: Secondary | ICD-10-CM

## 2012-06-18 HISTORY — PX: SKIN SPLIT GRAFT: SHX444

## 2012-06-18 HISTORY — PX: HYDRADENITIS EXCISION: SHX5243

## 2012-06-18 LAB — GLUCOSE, CAPILLARY
Glucose-Capillary: 151 mg/dL — ABNORMAL HIGH (ref 70–99)
Glucose-Capillary: 173 mg/dL — ABNORMAL HIGH (ref 70–99)

## 2012-06-18 SURGERY — EXCISION, HIDRADENITIS, AXILLA
Anesthesia: General | Site: Thigh | Laterality: Right | Wound class: Clean Contaminated

## 2012-06-18 MED ORDER — PROPOFOL 10 MG/ML IV EMUL
INTRAVENOUS | Status: AC
  Filled 2012-06-18: qty 20

## 2012-06-18 MED ORDER — LIDOCAINE HCL (PF) 1 % IJ SOLN
INTRAMUSCULAR | Status: AC
  Filled 2012-06-18: qty 5

## 2012-06-18 MED ORDER — ONDANSETRON HCL 4 MG/2ML IJ SOLN
4.0000 mg | Freq: Once | INTRAMUSCULAR | Status: AC
  Administered 2012-06-18: 4 mg via INTRAVENOUS

## 2012-06-18 MED ORDER — MORPHINE SULFATE 2 MG/ML IJ SOLN
1.0000 mg | INTRAMUSCULAR | Status: DC | PRN
  Administered 2012-06-18 – 2012-06-20 (×3): 1 mg via INTRAVENOUS
  Filled 2012-06-18 (×5): qty 1

## 2012-06-18 MED ORDER — ACETAMINOPHEN 325 MG PO TABS: 650.0000 mg | ORAL_TABLET | Freq: Four times a day (QID) | ORAL | Status: DC | PRN

## 2012-06-18 MED ORDER — LINAGLIPTIN-METFORMIN HCL 2.5-500 MG PO TABS: 1.0000 | ORAL_TABLET | Freq: Two times a day (BID) | ORAL | Status: DC

## 2012-06-18 MED ORDER — EPINEPHRINE HCL 1 MG/ML IJ SOLN
INTRAMUSCULAR | Status: AC
  Filled 2012-06-18: qty 1

## 2012-06-18 MED ORDER — NEOSTIGMINE METHYLSULFATE 1 MG/ML IJ SOLN
INTRAMUSCULAR | Status: DC | PRN
  Administered 2012-06-18: 4 mg via INTRAVENOUS

## 2012-06-18 MED ORDER — EPHEDRINE SULFATE 50 MG/ML IJ SOLN
INTRAMUSCULAR | Status: AC
  Filled 2012-06-18: qty 1

## 2012-06-18 MED ORDER — ARTIFICIAL TEARS OP OINT
TOPICAL_OINTMENT | OPHTHALMIC | Status: AC
  Filled 2012-06-18: qty 3.5

## 2012-06-18 MED ORDER — EPINEPHRINE HCL 1 MG/ML IJ SOLN
INTRAMUSCULAR | Status: DC | PRN
  Administered 2012-06-18 (×2): 1 mg

## 2012-06-18 MED ORDER — SODIUM CHLORIDE 0.9 % IR SOLN
Status: DC | PRN
  Administered 2012-06-18: 1000 mL

## 2012-06-18 MED ORDER — ONDANSETRON HCL 4 MG/2ML IJ SOLN
INTRAMUSCULAR | Status: AC
  Filled 2012-06-18: qty 2

## 2012-06-18 MED ORDER — ALLOPURINOL 300 MG PO TABS
300.0000 mg | ORAL_TABLET | Freq: Every day | ORAL | Status: DC
  Administered 2012-06-18 – 2012-06-21 (×4): 300 mg via ORAL
  Filled 2012-06-18 (×4): qty 1

## 2012-06-18 MED ORDER — ONDANSETRON HCL 4 MG/2ML IJ SOLN: 4.0000 mg | Freq: Four times a day (QID) | INTRAMUSCULAR | Status: DC | PRN

## 2012-06-18 MED ORDER — PHENYLEPHRINE HCL 10 MG/ML IJ SOLN
INTRAMUSCULAR | Status: DC | PRN
  Administered 2012-06-18 (×2): 100 ug via INTRAVENOUS

## 2012-06-18 MED ORDER — INSULIN ASPART 100 UNIT/ML ~~LOC~~ SOLN
0.0000 [IU] | Freq: Three times a day (TID) | SUBCUTANEOUS | Status: DC
  Administered 2012-06-18: 3 [IU] via SUBCUTANEOUS
  Administered 2012-06-19: 2 [IU] via SUBCUTANEOUS
  Administered 2012-06-19: 1 [IU] via SUBCUTANEOUS
  Administered 2012-06-19: 2 [IU] via SUBCUTANEOUS

## 2012-06-18 MED ORDER — SCOPOLAMINE 1 MG/3DAYS TD PT72
MEDICATED_PATCH | TRANSDERMAL | Status: AC
  Filled 2012-06-18: qty 1

## 2012-06-18 MED ORDER — DICYCLOMINE HCL 10 MG PO CAPS: 10.0000 mg | ORAL_CAPSULE | Freq: Three times a day (TID) | ORAL | Status: DC | PRN

## 2012-06-18 MED ORDER — LIDOCAINE HCL (CARDIAC) 20 MG/ML IV SOLN
INTRAVENOUS | Status: DC | PRN
  Administered 2012-06-18: 50 mg via INTRAVENOUS

## 2012-06-18 MED ORDER — PHENYLEPHRINE HCL 10 MG/ML IJ SOLN
INTRAMUSCULAR | Status: AC
  Filled 2012-06-18: qty 1

## 2012-06-18 MED ORDER — DEXTROSE 5 % IV SOLN
3.0000 g | Freq: Once | INTRAVENOUS | Status: AC
  Administered 2012-06-18: 3 g via INTRAVENOUS
  Filled 2012-06-18: qty 3000

## 2012-06-18 MED ORDER — GLYCOPYRROLATE 0.2 MG/ML IJ SOLN
INTRAMUSCULAR | Status: AC
  Filled 2012-06-18: qty 1

## 2012-06-18 MED ORDER — FENTANYL CITRATE 0.05 MG/ML IJ SOLN
INTRAMUSCULAR | Status: AC
  Filled 2012-06-18: qty 2

## 2012-06-18 MED ORDER — SUCCINYLCHOLINE CHLORIDE 20 MG/ML IJ SOLN
INTRAMUSCULAR | Status: AC
  Filled 2012-06-18: qty 1

## 2012-06-18 MED ORDER — SUCCINYLCHOLINE CHLORIDE 20 MG/ML IJ SOLN
INTRAMUSCULAR | Status: DC | PRN
  Administered 2012-06-18: 120 mg via INTRAVENOUS

## 2012-06-18 MED ORDER — COLCHICINE 0.6 MG PO TABS: 0.6000 mg | ORAL_TABLET | Freq: Every day | ORAL | Status: DC | PRN

## 2012-06-18 MED ORDER — SCOPOLAMINE 1 MG/3DAYS TD PT72
1.0000 | MEDICATED_PATCH | Freq: Once | TRANSDERMAL | Status: DC
  Administered 2012-06-18: 1.5 mg via TRANSDERMAL

## 2012-06-18 MED ORDER — DEXAMETHASONE SODIUM PHOSPHATE 4 MG/ML IJ SOLN
INTRAMUSCULAR | Status: AC
  Filled 2012-06-18: qty 1

## 2012-06-18 MED ORDER — ONDANSETRON HCL 4 MG/2ML IJ SOLN: 4.0000 mg | Freq: Once | INTRAMUSCULAR | Status: DC | PRN

## 2012-06-18 MED ORDER — PROPOFOL 10 MG/ML IV BOLUS
INTRAVENOUS | Status: DC | PRN
  Administered 2012-06-18: 180 mg via INTRAVENOUS

## 2012-06-18 MED ORDER — ONDANSETRON HCL 4 MG PO TABS
4.0000 mg | ORAL_TABLET | Freq: Four times a day (QID) | ORAL | Status: DC | PRN
  Administered 2012-06-20 – 2012-06-21 (×2): 4 mg via ORAL
  Filled 2012-06-18 (×2): qty 1

## 2012-06-18 MED ORDER — MIDAZOLAM HCL 2 MG/2ML IJ SOLN
1.0000 mg | INTRAMUSCULAR | Status: DC | PRN
  Administered 2012-06-18: 2 mg via INTRAVENOUS

## 2012-06-18 MED ORDER — FENTANYL CITRATE 0.05 MG/ML IJ SOLN
INTRAMUSCULAR | Status: DC | PRN
  Administered 2012-06-18: 100 ug via INTRAVENOUS
  Administered 2012-06-18 (×2): 50 ug via INTRAVENOUS
  Administered 2012-06-18: 100 ug via INTRAVENOUS
  Administered 2012-06-18: 50 ug via INTRAVENOUS

## 2012-06-18 MED ORDER — EPHEDRINE SULFATE 50 MG/ML IJ SOLN
INTRAMUSCULAR | Status: DC | PRN
  Administered 2012-06-18 (×2): 10 mg via INTRAVENOUS

## 2012-06-18 MED ORDER — CEFAZOLIN SODIUM 1-5 GM-% IV SOLN
INTRAVENOUS | Status: AC
  Filled 2012-06-18: qty 50

## 2012-06-18 MED ORDER — FENTANYL CITRATE 0.05 MG/ML IJ SOLN
25.0000 ug | INTRAMUSCULAR | Status: DC | PRN
  Administered 2012-06-18 (×2): 50 ug via INTRAVENOUS

## 2012-06-18 MED ORDER — MIDAZOLAM HCL 5 MG/5ML IJ SOLN
INTRAMUSCULAR | Status: DC | PRN
  Administered 2012-06-18: 2 mg via INTRAVENOUS

## 2012-06-18 MED ORDER — ROCURONIUM BROMIDE 50 MG/5ML IV SOLN
INTRAVENOUS | Status: AC
  Filled 2012-06-18: qty 1

## 2012-06-18 MED ORDER — MIDAZOLAM HCL 2 MG/2ML IJ SOLN
INTRAMUSCULAR | Status: AC
  Filled 2012-06-18: qty 2

## 2012-06-18 MED ORDER — INSULIN ASPART 100 UNIT/ML ~~LOC~~ SOLN: 0.0000 [IU] | Freq: Every day | SUBCUTANEOUS | Status: DC

## 2012-06-18 MED ORDER — EPINEPHRINE HCL 1 MG/ML IJ SOLN
INTRAMUSCULAR | Status: AC
  Filled 2012-06-18: qty 2

## 2012-06-18 MED ORDER — ROCURONIUM BROMIDE 100 MG/10ML IV SOLN
INTRAVENOUS | Status: DC | PRN
  Administered 2012-06-18: 10 mg via INTRAVENOUS
  Administered 2012-06-18: 20 mg via INTRAVENOUS

## 2012-06-18 MED ORDER — DEXAMETHASONE SODIUM PHOSPHATE 4 MG/ML IJ SOLN
4.0000 mg | Freq: Once | INTRAMUSCULAR | Status: AC
  Administered 2012-06-18: 4 mg via INTRAVENOUS

## 2012-06-18 MED ORDER — THROMBIN 5000 UNITS EX SOLR
CUTANEOUS | Status: AC
  Filled 2012-06-18: qty 5000

## 2012-06-18 MED ORDER — GLYCOPYRROLATE 0.2 MG/ML IJ SOLN
INTRAMUSCULAR | Status: AC
  Filled 2012-06-18: qty 3

## 2012-06-18 MED ORDER — GLYCOPYRROLATE 0.2 MG/ML IJ SOLN
INTRAMUSCULAR | Status: DC | PRN
  Administered 2012-06-18: 0.6 mg via INTRAVENOUS

## 2012-06-18 MED ORDER — LACTATED RINGERS IV SOLN
INTRAVENOUS | Status: DC
  Administered 2012-06-18: 08:00:00 via INTRAVENOUS

## 2012-06-18 MED ORDER — PANTOPRAZOLE SODIUM 40 MG PO TBEC
40.0000 mg | DELAYED_RELEASE_TABLET | Freq: Every day | ORAL | Status: DC
  Administered 2012-06-19 – 2012-06-21 (×3): 40 mg via ORAL
  Filled 2012-06-18 (×3): qty 1

## 2012-06-18 MED ORDER — POTASSIUM CHLORIDE IN NACL 20-0.9 MEQ/L-% IV SOLN
INTRAVENOUS | Status: DC
  Administered 2012-06-18 – 2012-06-19 (×3): via INTRAVENOUS

## 2012-06-18 MED ORDER — MINERAL OIL LIGHT 100 % EX OIL
TOPICAL_OIL | CUTANEOUS | Status: DC | PRN
  Administered 2012-06-18 (×2): 1 via TOPICAL

## 2012-06-18 MED ORDER — LINAGLIPTIN 5 MG PO TABS
2.5000 mg | ORAL_TABLET | Freq: Two times a day (BID) | ORAL | Status: DC
  Administered 2012-06-18 – 2012-06-21 (×6): 2.5 mg via ORAL
  Filled 2012-06-18 (×10): qty 1

## 2012-06-18 MED ORDER — LORAZEPAM 0.5 MG PO TABS
0.5000 mg | ORAL_TABLET | Freq: Every day | ORAL | Status: DC
  Administered 2012-06-18 – 2012-06-20 (×3): 0.5 mg via ORAL
  Filled 2012-06-18 (×3): qty 1

## 2012-06-18 MED ORDER — THROMBIN 5000 UNITS EX SOLR
CUTANEOUS | Status: DC | PRN
  Administered 2012-06-18 (×2): 5000 [IU] via TOPICAL

## 2012-06-18 MED ORDER — NEOSTIGMINE METHYLSULFATE 1 MG/ML IJ SOLN
INTRAMUSCULAR | Status: AC
  Filled 2012-06-18: qty 1

## 2012-06-18 MED ORDER — NEOMYCIN-POLYMYXIN B GU 40-200000 IR SOLN
Status: DC | PRN
  Administered 2012-06-18: 1000 mL

## 2012-06-18 MED ORDER — ADULT MULTIVITAMIN W/MINERALS CH
2.0000 | ORAL_TABLET | Freq: Every day | ORAL | Status: DC
  Administered 2012-06-18 – 2012-06-21 (×4): 2 via ORAL
  Filled 2012-06-18: qty 1
  Filled 2012-06-18 (×3): qty 2
  Filled 2012-06-18: qty 1

## 2012-06-18 MED ORDER — METFORMIN HCL 500 MG PO TABS
500.0000 mg | ORAL_TABLET | Freq: Two times a day (BID) | ORAL | Status: DC
  Administered 2012-06-18 – 2012-06-21 (×6): 500 mg via ORAL
  Filled 2012-06-18 (×6): qty 1

## 2012-06-18 MED ORDER — CEFAZOLIN SODIUM-DEXTROSE 2-3 GM-% IV SOLR
INTRAVENOUS | Status: AC
  Filled 2012-06-18: qty 50

## 2012-06-18 MED ORDER — SULFAMETHOXAZOLE-TMP DS 800-160 MG PO TABS
1.0000 | ORAL_TABLET | Freq: Two times a day (BID) | ORAL | Status: DC
  Administered 2012-06-18 – 2012-06-21 (×7): 1 via ORAL
  Filled 2012-06-18 (×7): qty 1

## 2012-06-18 MED ORDER — SIMVASTATIN 20 MG PO TABS
20.0000 mg | ORAL_TABLET | Freq: Every day | ORAL | Status: DC
  Administered 2012-06-18 – 2012-06-20 (×3): 20 mg via ORAL
  Filled 2012-06-18 (×3): qty 1

## 2012-06-18 MED ORDER — THROMBIN 5000 UNITS EX SOLR
Freq: Once | CUTANEOUS | Status: AC
  Administered 2012-06-18: 09:00:00 via TOPICAL
  Filled 2012-06-18: qty 5000

## 2012-06-18 MED ORDER — LACTATED RINGERS IV SOLN
INTRAVENOUS | Status: DC | PRN
  Administered 2012-06-18 (×3): via INTRAVENOUS

## 2012-06-18 MED ORDER — FENTANYL CITRATE 0.05 MG/ML IJ SOLN
INTRAMUSCULAR | Status: AC
  Filled 2012-06-18: qty 5

## 2012-06-18 SURGICAL SUPPLY — 44 items
ATTRACTOMAT 16X20 MAGNETIC DRP (DRAPES) ×3 IMPLANT
BAG HAMPER (MISCELLANEOUS) ×3 IMPLANT
BANDAGE GAUZE ELAST BULKY 4 IN (GAUZE/BANDAGES/DRESSINGS) ×21 IMPLANT
BLADE DERMATOME SS (BLADE) ×6 IMPLANT
BLADE SURG 15 STRL LF DISP TIS (BLADE) ×2 IMPLANT
BLADE SURG 15 STRL SS (BLADE) ×1
BLADE SURG SZ10 CARB STEEL (BLADE) ×3 IMPLANT
BRUSH SCRUB DISP (MISCELLANEOUS) ×6 IMPLANT
CLOTH BEACON ORANGE TIMEOUT ST (SAFETY) ×3 IMPLANT
COVER LIGHT HANDLE STERIS (MISCELLANEOUS) ×6 IMPLANT
DEPRESSOR TONGUE BLADE WOOD (MISCELLANEOUS) ×6 IMPLANT
DERMACARRIERS GRAFT 1 TO 1.5 (DISPOSABLE) ×6
DRAPE PROXIMA HALF (DRAPES) ×18 IMPLANT
DRSG ADAPTIC 3X8 NADH LF (GAUZE/BANDAGES/DRESSINGS) ×24 IMPLANT
DRSG TEGADERM 4X10 (GAUZE/BANDAGES/DRESSINGS) ×27 IMPLANT
ELECT REM PT RETURN 9FT ADLT (ELECTROSURGICAL) ×3
ELECTRODE REM PT RTRN 9FT ADLT (ELECTROSURGICAL) ×2 IMPLANT
FORMALIN 10 PREFIL 480ML (MISCELLANEOUS) ×3 IMPLANT
GLOVE BIO SURGEON STRL SZ7.5 (GLOVE) ×3 IMPLANT
GLOVE ECLIPSE 6.5 STRL STRAW (GLOVE) ×3 IMPLANT
GLOVE ECLIPSE 7.0 STRL STRAW (GLOVE) ×3 IMPLANT
GLOVE INDICATOR 7.0 STRL GRN (GLOVE) ×12 IMPLANT
GLOVE INDICATOR 7.5 STRL GRN (GLOVE) ×3 IMPLANT
GLOVE SKINSENSE NS SZ7.0 (GLOVE) ×2
GLOVE SKINSENSE STRL SZ7.0 (GLOVE) ×4 IMPLANT
GOWN STRL REIN XL XLG (GOWN DISPOSABLE) ×9 IMPLANT
GRAFT DERMACARRIERS 1 TO 1.5 (DISPOSABLE) ×4 IMPLANT
INST SET MAJOR GENERAL (KITS) ×3 IMPLANT
KIT ROOM TURNOVER APOR (KITS) ×3 IMPLANT
MANIFOLD NEPTUNE II (INSTRUMENTS) ×3 IMPLANT
MARKER SKIN DUAL TIP RULER LAB (MISCELLANEOUS) ×3 IMPLANT
NS IRRIG 1000ML POUR BTL (IV SOLUTION) ×3 IMPLANT
PACK ABDOMINAL MAJOR (CUSTOM PROCEDURE TRAY) ×3 IMPLANT
PAD ARMBOARD 7.5X6 YLW CONV (MISCELLANEOUS) ×6 IMPLANT
SET BASIN LINEN APH (SET/KITS/TRAYS/PACK) ×3 IMPLANT
SOL PREP PROV IODINE SCRUB 4OZ (MISCELLANEOUS) ×3 IMPLANT
SPONGE GAUZE 4X4 12PLY (GAUZE/BANDAGES/DRESSINGS) ×6 IMPLANT
SPONGE LAP 18X18 X RAY DECT (DISPOSABLE) ×6 IMPLANT
STOCKINETTE IMPERVIOUS LG (DRAPES) ×3 IMPLANT
SUT CHROMIC 4 0 PS 2 18 (SUTURE) ×6 IMPLANT
TAPE CLOTH SURG 4X10 WHT LF (GAUZE/BANDAGES/DRESSINGS) ×3 IMPLANT
TOWEL BLUE STERILE X RAY DET (MISCELLANEOUS) ×6 IMPLANT
TOWEL OR 17X26 4PK STRL BLUE (TOWEL DISPOSABLE) ×6 IMPLANT
WATER STERILE IRR 1000ML POUR (IV SOLUTION) ×6 IMPLANT

## 2012-06-18 NOTE — Progress Notes (Signed)
Bilateral skin graft sights to thighs pink in color with no active bloody drainage. Op-site drsg to bilateral sights.

## 2012-06-18 NOTE — Brief Op Note (Signed)
06/18/2012  10:45 AM  PATIENT:  Cheryl Black  27 y.o. female  PRE-OPERATIVE DIAGNOSIS:  hidradenitis suprativa right axilla  POST-OPERATIVE DIAGNOSIS:  hidradenitis suprativa right axilla  PROCEDURE:  Procedure(s): EXCISION HYDRIADENITIS SUPRATIVA  RIGHT AXILLA (Right) SKIN GRAFT SPLIT THICKNESS RIGHT AXILLA(WITH TWO STANDARD SKIN BOARDS 3"x 8" )  DONOR SITE RIGHT AND LEFT THIGHS (Right)  SURGEON:  Surgeon(s) and Role:    * Marlane Hatcher, MD - Primary  PHYSICIAN ASSISTANT:   ASSISTANTS: none   ANESTHESIA:   general  EBL:  Total I/O In: 2500 [I.V.:2500] Out: 25 [Blood:25]  BLOOD ADMINISTERED:none  DRAINS: none   LOCAL MEDICATIONS USED:  NONE  SPECIMEN:  Source of Specimen:  Right axillary tissue  DISPOSITION OF SPECIMEN:  PATHOLOGY  COUNTS:  YES  TOURNIQUET:  * No tourniquets in log *  DICTATION: .Other Dictation: Dictation Number OR dict.# [id number].  PLAN OF CARE: Admit to inpatient   PATIENT DISPOSITION:  PACU - hemodynamically stable.   Delay start of Pharmacological VTE agent (>24hrs) due to surgical blood loss or risk of bleeding: not applicable

## 2012-06-18 NOTE — Progress Notes (Signed)
Skin graft sights to bilateral thighs pink in color with scant amt of red drainage present. Op-site drsg to bilateral sights.

## 2012-06-18 NOTE — Progress Notes (Signed)
27 yr old w. Female with hydradenitis suppurativa of the axilla, will excise and apply STSG to right side.  Will need to be in splint that PT dept. Devised post op.  Procedure and risks explained and informed consent obtained and right axilla marked.  Labs reviewed.  No clinical change in H&P, dict. # [id number].  Filed Vitals:   06/18/12 0646  BP: 105/58  Pulse: 76  Temp: 97.9 F (36.6 C)  Resp: 18

## 2012-06-18 NOTE — Anesthesia Procedure Notes (Signed)
Procedure Name: Intubation Date/Time: 06/18/2012 7:51 AM Performed by: Antony Contras, AMY L Pre-anesthesia Checklist: Patient identified, Patient being monitored, Timeout performed, Emergency Drugs available and Suction available Patient Re-evaluated:Patient Re-evaluated prior to inductionOxygen Delivery Method: Circle System Utilized Preoxygenation: Pre-oxygenation with 100% oxygen Intubation Type: IV induction Ventilation: Mask ventilation without difficulty Laryngoscope Size: 3 and Miller Grade View: Grade I Tube type: Oral Tube size: 7.0 mm Number of attempts: 1 Airway Equipment and Method: stylet Placement Confirmation: ETT inserted through vocal cords under direct vision,  positive ETCO2 and breath sounds checked- equal and bilateral Secured at: 21 cm Tube secured with: Tape Dental Injury: Teeth and Oropharynx as per pre-operative assessment

## 2012-06-18 NOTE — Preoperative (Signed)
Beta Blockers   Reason not to administer Beta Blockers:Not Applicable 

## 2012-06-18 NOTE — Transfer of Care (Signed)
Immediate Anesthesia Transfer of Care Note  Patient: Cheryl Black  Procedure(s) Performed: Procedure(s): EXCISION HYDRIADENITIS SUPRATIVA  RIGHT AXILLA (Right) SKIN GRAFT SPLIT THICKNESS RIGHT AXILLA(WITH TWO STANDARD SKIN BOARDS 3"x 8" )  DONOR SITE RIGHT AND LEFT THIGHS (Right)  Patient Location: PACU  Anesthesia Type:General  Level of Consciousness: sedated and patient cooperative  Airway & Oxygen Therapy: Patient Spontanous Breathing and Patient connected to face mask oxygen  Post-op Assessment: Report given to PACU RN and Post -op Vital signs reviewed and stable  Post vital signs: Reviewed and stable  Complications: No apparent anesthesia complications

## 2012-06-18 NOTE — Progress Notes (Signed)
Post OP Check  Pt resting comfortably; pain under control with current regimen.  Dressings dry and in tact.  Donor sites dressed and clean. Airplane splint in place.  Will remove foley in AM when ambulating better.  Blood sugar 194 will cover with sliding scale.  Filed Vitals:   06/18/12 1554  BP: 111/73  Pulse: 97  Temp:   Resp:   temp. 98.0, Resp. 18

## 2012-06-18 NOTE — Progress Notes (Signed)
Awake. Rates pain 2. Refuses additional pain med.

## 2012-06-18 NOTE — Anesthesia Postprocedure Evaluation (Addendum)
  Anesthesia Post-op Note  Patient: Cheryl Black  Procedure(s) Performed: Procedure(s): EXCISION HYDRIADENITIS SUPRATIVA  RIGHT AXILLA (Right) SKIN GRAFT SPLIT THICKNESS RIGHT AXILLA(WITH TWO STANDARD SKIN BOARDS 3"x 8" )  DONOR SITE RIGHT AND LEFT THIGHS (Right)  Patient Location: PACU  Anesthesia Type:General  Level of Consciousness: sedated and patient cooperative  Airway and Oxygen Therapy: Patient Spontanous Breathing and Patient connected to face mask oxygen  Post-op Pain: mild  Post-op Assessment: Post-op Vital signs reviewed, Patient's Cardiovascular Status Stable, Respiratory Function Stable, Patent Airway, No signs of Nausea or vomiting and Pain level controlled  Post-op Vital Signs: Reviewed and stable  Complications: No apparent anesthesia complications 06/19/12  Dr. Malvin Johns with patient.  VSS.  Denies PONV, recall.  Slight sore throat, otherwise no apparent anesthesia complications.

## 2012-06-18 NOTE — Anesthesia Preprocedure Evaluation (Signed)
Anesthesia Evaluation  Patient identified by MRN, date of birth, ID band Patient awake    Reviewed: Allergy & Precautions, H&P , NPO status , Patient's Chart, lab work & pertinent test results  History of Anesthesia Complications (+) PONV  Airway Mallampati: II TM Distance: >3 FB Neck ROM: Full    Dental no notable dental hx.    Pulmonary neg pulmonary ROS,    Pulmonary exam normal       Cardiovascular negative cardio ROS  Rhythm:Regular Rate:Normal     Neuro/Psych negative neurological ROS  negative psych ROS   GI/Hepatic GERD-  Medicated and Controlled,  Endo/Other  diabetes, Well Controlled, Type 2, Oral Hypoglycemic AgentsMorbid obesity  Renal/GU      Musculoskeletal   Abdominal (+) + obese,  Abdomen: soft.    Peds  Hematology   Anesthesia Other Findings   Reproductive/Obstetrics                           Anesthesia Physical Anesthesia Plan  ASA: II  Anesthesia Plan: General   Post-op Pain Management:    Induction: Intravenous, Rapid sequence and Cricoid pressure planned  Airway Management Planned: Oral ETT  Additional Equipment:   Intra-op Plan:   Post-operative Plan: Extubation in OR  Informed Consent: I have reviewed the patients History and Physical, chart, labs and discussed the procedure including the risks, benefits and alternatives for the proposed anesthesia with the patient or authorized representative who has indicated his/her understanding and acceptance.   Dental advisory given  Plan Discussed with: CRNA  Anesthesia Plan Comments:         Anesthesia Quick Evaluation

## 2012-06-19 LAB — GLUCOSE, CAPILLARY
Glucose-Capillary: 155 mg/dL — ABNORMAL HIGH (ref 70–99)
Glucose-Capillary: 137 mg/dL — ABNORMAL HIGH (ref 70–99)
Glucose-Capillary: 123 mg/dL — ABNORMAL HIGH (ref 70–99)

## 2012-06-19 MED ORDER — DOCUSATE SODIUM 100 MG PO CAPS
100.0000 mg | ORAL_CAPSULE | Freq: Every day | ORAL | Status: DC
  Administered 2012-06-19 – 2012-06-21 (×3): 100 mg via ORAL
  Filled 2012-06-19 (×3): qty 1

## 2012-06-19 MED ORDER — HYDROCODONE-ACETAMINOPHEN 5-325 MG PO TABS
1.0000 | ORAL_TABLET | Freq: Four times a day (QID) | ORAL | Status: DC | PRN
  Administered 2012-06-19 – 2012-06-21 (×4): 1 via ORAL
  Filled 2012-06-19 (×6): qty 1

## 2012-06-19 NOTE — Progress Notes (Signed)
POD #1  Filed Vitals:   06/19/12 0534  BP: 93/63  Pulse: 53  Temp: 97.9 F (36.6 C)  Resp: 18   Pt had restless night but pain under control.   Splint uncomfortable and I padded it with some egg crate foam cushion.  Dressings in tact.   Donor site dressings oozing some and have redressed with Tegaderm  Pt has voided without catheter..  Blood sugar under control with sliding scale in 150's.  Doing well post op.Marland Kitchen

## 2012-06-19 NOTE — Op Note (Signed)
[name], [name] NO.:  [id number]  MEDICAL RECORD NO.:  [id number]  LOCATION:  A221                          FACILITY:  [facility]  PHYSICIAN:  Barbaraann Barthel, M.D. DATE OF BIRTH:  02-Jan-1986  DATE OF PROCEDURE:  06/18/2012 DATE OF DISCHARGE:                              OPERATIVE REPORT   DIAGNOSIS:  Hidradenitis suppurative.  PROCEDURE:  Excision of right axillary hidradenitis suppurative with split-thickness skin graft (two standard skin boards, harvested one from each thigh).  NOTE:  This is a 27 year old obese, type 2 diabetic with recurrent hidradenitis suppurative.  Both axillary areas were affected.  The inframammary area and the groin areas were less affected.  We treated her with antibiotics and the abscesses dried up considerably as well as the cellulitis and she opted for excision as this was continuing to plague her and we discussed the need to remove the tissue from this area and then apply a split-thickness skin graft.  Informed consent was obtained.  We discussed complications not limited to, but including bleeding and infection and further surgery would be required as well as discussed in detail the discomfort of having immobility and splint needed for her right arm.  Informed consent was obtained.  WOUND CLASSIFICATION: 1. Clean contaminated. 2. Specimen of right axillary tissue.  TECHNIQUE:  The patient was placed in the supine position, rolled slightly to the left and the right axillary area and the arm were prepped as limb isolator was used.  Also, we prepped the right thigh with Hibiclens and Betadine solution as well.  After prepping and draping, I excised the area of the right axillary area where this tissue was affected.  This was removed and sent as a specimen.  We then irrigated this with GU irrigant solution.  There were no.  There were no abscesses.  There was some granulation tissue, which was removed.  After irrigating  this, we then controlled the bleeding with a cautery device and then I harvested the skin board of skin from her right thigh area. This was not enough.  We needed more skin, so I harvested some other board from her left axilla.  This was meshed to 1-1.5 ratio and then sutured in place with 4-0 chromic.  We then put Adaptic, 4x4s and put the dressing in place.  With the help of the OT Department, we had an airplane splint which we placed on her right arm in order to keep her axilla from her axillary tissue from a posing itself and allowing the skin graft to have a better chance for taking.  Once this was done, we then covered the donor sites with thrombin, epinephrine solution, and then placed a sterile OpSite dressing over this.  Prior to closure, all sponge, needle, and instrument counts were found to be correct.  Estimated blood loss was minimal, less than 25 mL, and the patient received 2500 mL crystalloids intraoperatively.  There were no complications.     Barbaraann Barthel, M.D.     WB/MEDQ  D:  06/18/2012  T:  06/19/2012  Job:  [id number]  cc:   Catalina Pizza, M.D. Fax: [phone]

## 2012-06-20 LAB — GLUCOSE, CAPILLARY: Glucose-Capillary: 111 mg/dL — ABNORMAL HIGH (ref 70–99)

## 2012-06-20 MED ORDER — SODIUM CHLORIDE 0.9 % IV SOLN
250.0000 mL | INTRAVENOUS | Status: DC | PRN
  Administered 2012-06-20: 250 mL via INTRAVENOUS

## 2012-06-20 MED ORDER — SODIUM CHLORIDE 0.9 % IJ SOLN
3.0000 mL | Freq: Two times a day (BID) | INTRAMUSCULAR | Status: DC
  Administered 2012-06-20 – 2012-06-21 (×2): 3 mL via INTRAVENOUS

## 2012-06-20 MED ORDER — SODIUM CHLORIDE 0.9 % IJ SOLN
3.0000 mL | INTRAMUSCULAR | Status: DC | PRN
  Administered 2012-06-20: 3 mL via INTRAVENOUS

## 2012-06-20 NOTE — Progress Notes (Signed)
POD #2  Filed Vitals:   06/20/12 1407  BP: 97/56  Pulse: 62  Temp: 97.8 F (36.6 C)  Resp: 18  Pt feels better.  Wounds clean and dry.  Donor site dressings reinforced. Voiding without dysuria.  Splint Ok.  No numbness or tingling in fingers pulses symetrical  Doing well post OP.

## 2012-06-21 ENCOUNTER — Encounter (HOSPITAL_COMMUNITY): Payer: Self-pay | Admitting: General Surgery

## 2012-06-21 LAB — GLUCOSE, CAPILLARY

## 2012-06-21 MED ORDER — DSS 100 MG PO CAPS: 100.0000 mg | ORAL_CAPSULE | Freq: Every day | ORAL | Status: DC

## 2012-06-21 MED ORDER — HYDROCODONE-ACETAMINOPHEN 5-325 MG PO TABS: 1.0000 | ORAL_TABLET | Freq: Four times a day (QID) | ORAL | Status: DC | PRN

## 2012-06-21 NOTE — Progress Notes (Signed)
POD # 3   Wounds clean and dry donor sites redressed.  I will do first dressing change in office on POD #5.  Doing well.  Blood sugars acceptable. No dysuria or leg pain or shortness of breath.  Will have OT specialist pad splint and check prior to discharge.  Filed Vitals:   06/21/12 0624  BP: 110/75  Pulse: 73  Temp: 98.4 F (36.9 C)  Resp: 20   Discharge dictated, dict. # [id number].

## 2012-06-21 NOTE — Care Management Note (Signed)
    Page 1 of 1   06/21/2012     5:10:44 PM   CARE MANAGEMENT NOTE 06/21/2012  Patient:  ASHLON, LOTTMAN A   Account Number:  [id number]  Date Initiated:  06/21/2012  Documentation initiated by:  Anibal Henderson  Subjective/Objective Assessment:   Admitted for surgery to rt axilla, and has an airplane splint. Lives at home with spouse, is independent,  will be returning home. Home health discussed if needed but MD decided to do dressings in the office, so home health     Action/Plan:   will not be needed. No other care management needs identified   Anticipated DC Date:  06/21/2012   Anticipated DC Plan:  HOME/SELF CARE      DC Planning Services  CM consult      Choice offered to / List presented to:             Status of service:  Completed, signed off Medicare Important Message given?   (If response is "NO", the following Medicare IM given date fields will be blank) Date Medicare IM given:   Date Additional Medicare IM given:    Discharge Disposition:  HOME/SELF CARE  Per UR Regulation:  Reviewed for med. necessity/level of care/duration of stay  If discussed at Long Length of Stay Meetings, dates discussed:    Comments:  06/21/12 1300 Anibal Henderson RN/CM

## 2012-06-21 NOTE — Progress Notes (Signed)
Patient discharged with instructions, prescriptions, and care notes.  Pt/husband verbalized understanding.  The patient left the floor via w/c with staff and family in stable condition.

## 2012-06-22 NOTE — Discharge Summary (Signed)
NAMESHARMAIN, LASTRA NO.:  [id number]  MEDICAL RECORD NO.:  [id number]  LOCATION:  A221                          FACILITY:  [facility]  PHYSICIAN:  Barbaraann Barthel, M.D. DATE OF BIRTH:  1985/06/30  DATE OF ADMISSION:  06/18/2012 DATE OF DISCHARGE:  06/16/2014LH                              DISCHARGE SUMMARY   DIAGNOSIS:  Hidradenitis suppurativa in axilla area.  PROCEDURES:  Excision of right axillary hidradenitis suppurativa with split-thickness skin graft.  SECONDARY DIAGNOSES: 1. Type 2 diabetes. 2. Hypercholesterolemia. 3. Gout. 4. Obesity.  NOTE:  This is a 27 year old white female who we admitted via the outpatient department after controlling a suppurating area of her right axilla with antibiotics prior to  definitive procedure with excision and grafting of recurrent hidradenitis suppurativa.  She chose to do the right axilla 1st. (Both axilla were involved.  We will plan to remove and graft the left axilla later on.) She was taken to surgery on June 18, 2012, at which time, this was carried out uneventfully.  We kept her in the hospital for 2 days to make sure that her splint was fine and her graft was protected in the immediate post op period and she had no other problems  with her diabetes. At the time of discharge, she was able to move around much better, Her splint was re-evaluated by the OT people so that there would not be any compression problems and her donor sites were kept clean and redressed as needed.  We will do the 1st formal dressing change removing the splintin my office on postop day 5.  Her hospital procedure was uneventful.  She did require some pain medicines and we will be discharging her on Vicodin p.o. and we will follow up with her perioperatively after which she is to return to Dr. Dwana Melena for any medical problems she may have in the future.     Barbaraann Barthel, M.D.     WB/MEDQ  D:  06/21/2012  T:  06/21/2012  Job:   [id number]

## 2012-08-17 ENCOUNTER — Emergency Department (HOSPITAL_COMMUNITY)
Admission: EM | Admit: 2012-08-17 | Discharge: 2012-08-17 | Disposition: A | Discharge status: Home / Self Care | Payer: 59 | Attending: Emergency Medicine | Admitting: Emergency Medicine

## 2012-08-17 ENCOUNTER — Encounter (HOSPITAL_COMMUNITY): Payer: Self-pay | Admitting: Emergency Medicine

## 2012-08-17 DIAGNOSIS — K219 Gastro-esophageal reflux disease without esophagitis: Secondary | ICD-10-CM | POA: Insufficient documentation

## 2012-08-17 DIAGNOSIS — Z9889 Other specified postprocedural states: Secondary | ICD-10-CM | POA: Insufficient documentation

## 2012-08-17 DIAGNOSIS — Z8742 Personal history of other diseases of the female genital tract: Secondary | ICD-10-CM | POA: Insufficient documentation

## 2012-08-17 DIAGNOSIS — Z87448 Personal history of other diseases of urinary system: Secondary | ICD-10-CM | POA: Insufficient documentation

## 2012-08-17 DIAGNOSIS — E860 Dehydration: Secondary | ICD-10-CM | POA: Insufficient documentation

## 2012-08-17 DIAGNOSIS — E119 Type 2 diabetes mellitus without complications: Secondary | ICD-10-CM | POA: Insufficient documentation

## 2012-08-17 DIAGNOSIS — R110 Nausea: Secondary | ICD-10-CM | POA: Insufficient documentation

## 2012-08-17 DIAGNOSIS — R109 Unspecified abdominal pain: Secondary | ICD-10-CM | POA: Insufficient documentation

## 2012-08-17 DIAGNOSIS — I959 Hypotension, unspecified: Secondary | ICD-10-CM | POA: Insufficient documentation

## 2012-08-17 DIAGNOSIS — M109 Gout, unspecified: Secondary | ICD-10-CM | POA: Insufficient documentation

## 2012-08-17 DIAGNOSIS — Z79899 Other long term (current) drug therapy: Secondary | ICD-10-CM | POA: Insufficient documentation

## 2012-08-17 DIAGNOSIS — Z8719 Personal history of other diseases of the digestive system: Secondary | ICD-10-CM | POA: Insufficient documentation

## 2012-08-17 DIAGNOSIS — R7989 Other specified abnormal findings of blood chemistry: Secondary | ICD-10-CM | POA: Insufficient documentation

## 2012-08-17 DIAGNOSIS — Z3202 Encounter for pregnancy test, result negative: Secondary | ICD-10-CM | POA: Insufficient documentation

## 2012-08-17 DIAGNOSIS — F17200 Nicotine dependence, unspecified, uncomplicated: Secondary | ICD-10-CM | POA: Insufficient documentation

## 2012-08-17 DIAGNOSIS — Z87442 Personal history of urinary calculi: Secondary | ICD-10-CM | POA: Insufficient documentation

## 2012-08-17 LAB — COMPREHENSIVE METABOLIC PANEL
ALT: 184 U/L — ABNORMAL HIGH (ref 0–35)
AST: 107 U/L — ABNORMAL HIGH (ref 0–37)
Albumin: 4.1 g/dL (ref 3.5–5.2)
Alkaline Phosphatase: 93 U/L (ref 39–117)
BUN: 11 mg/dL (ref 6–23)
Chloride: 94 mEq/L — ABNORMAL LOW (ref 96–112)
Potassium: 3.5 mEq/L (ref 3.5–5.1)
Sodium: 133 mEq/L — ABNORMAL LOW (ref 135–145)
Total Bilirubin: 0.6 mg/dL (ref 0.3–1.2)

## 2012-08-17 LAB — URINALYSIS, ROUTINE W REFLEX MICROSCOPIC
Bilirubin Urine: NEGATIVE
Glucose, UA: >1000 mg/dL — AB
Ketones, ur: NEGATIVE mg/dL
Protein, ur: NEGATIVE mg/dL
pH: 5.5 (ref 5.0–8.0)

## 2012-08-17 LAB — URINE MICROSCOPIC-ADD ON

## 2012-08-17 LAB — CBC WITH DIFFERENTIAL/PLATELET
Basophils Absolute: 0 10*3/uL (ref 0.0–0.1)
Basophils Relative: 0 % (ref 0–1)
Hemoglobin: 17 g/dL — ABNORMAL HIGH (ref 12.0–15.0)
MCHC: 35.1 g/dL (ref 30.0–36.0)
Monocytes Relative: 7 % (ref 3–12)
Neutro Abs: 7.9 10*3/uL — ABNORMAL HIGH (ref 1.7–7.7)
Neutrophils Relative %: 65 % (ref 43–77)
Platelets: 361 10*3/uL (ref 150–400)

## 2012-08-17 MED ORDER — DIPHENHYDRAMINE HCL 50 MG/ML IJ SOLN
25.0000 mg | Freq: Once | INTRAMUSCULAR | Status: AC
  Administered 2012-08-17: 25 mg via INTRAVENOUS
  Filled 2012-08-17: qty 1

## 2012-08-17 MED ORDER — METOCLOPRAMIDE HCL 5 MG/ML IJ SOLN
10.0000 mg | Freq: Once | INTRAMUSCULAR | Status: DC
  Filled 2012-08-17: qty 2

## 2012-08-17 MED ORDER — METOCLOPRAMIDE HCL 5 MG/ML IJ SOLN
10.0000 mg | Freq: Once | INTRAMUSCULAR | Status: AC
  Administered 2012-08-17: 10 mg via INTRAVENOUS

## 2012-08-17 MED ORDER — SODIUM CHLORIDE 0.9 % IV SOLN
1000.0000 mL | Freq: Once | INTRAVENOUS | Status: AC
  Administered 2012-08-17: 1000 mL via INTRAVENOUS

## 2012-08-17 MED ORDER — METOCLOPRAMIDE HCL 10 MG PO TABS: 10.0000 mg | ORAL_TABLET | Freq: Four times a day (QID) | ORAL | Status: DC | PRN

## 2012-08-17 MED ORDER — SODIUM CHLORIDE 0.9 % IV SOLN
1000.0000 mL | INTRAVENOUS | Status: DC
  Administered 2012-08-17: 1000 mL via INTRAVENOUS

## 2012-08-17 MED ORDER — ONDANSETRON HCL 4 MG/2ML IJ SOLN
4.0000 mg | Freq: Once | INTRAMUSCULAR | Status: AC
  Administered 2012-08-17: 4 mg via INTRAVENOUS
  Filled 2012-08-17: qty 2

## 2012-08-17 MED ORDER — SODIUM CHLORIDE 0.9 % IV BOLUS (SEPSIS)
1000.0000 mL | Freq: Once | INTRAVENOUS | Status: AC
  Administered 2012-08-17: 1000 mL via INTRAVENOUS

## 2012-08-17 NOTE — ED Notes (Signed)
Patient c/o continued nausea; patient with dry heaves.

## 2012-08-17 NOTE — ED Notes (Signed)
Patient c/o sudden onset of dizziness and nausea.

## 2012-08-17 NOTE — ED Notes (Signed)
Patient c/o sudden onset of abdominal pain, nausea and dizziness while at work.

## 2012-08-17 NOTE — ED Notes (Signed)
Patient ambulated to bathroom; c/o increased dizziness and patient has unsteady gait.

## 2012-08-17 NOTE — ED Provider Notes (Signed)
CSN: [id number]     Arrival date & time 08/17/12  0251 History     First MD Initiated Contact with Patient 08/17/12 0308     Chief Complaint  Patient presents with  . Dizziness  . Nausea  . Abdominal Pain   (Consider location/radiation/quality/duration/timing/severity/associated sxs/prior Treatment) Patient is a 27 y.o. female presenting with abdominal pain. The history is provided by the patient.  Abdominal Pain She actually is denying abdominal pain to me. She states that she had sudden onset of dizziness which felt like she was going to pass out and this was associated with nausea. She denies diaphoresis. She did have dry heaves but did not vomit. She checked her blood pressure and it was 91 systolic and states that she normally runs about 409 systolic. She had surgery one week ago for hidradenitis suppurativa of the right axilla but states that she is not having any pain from the surgical excision and has not been having any problems postoperatively until tonight.  Past Medical History  Diagnosis Date  . Fibrocystic breast disease   . Acid reflux   . Gout   . Polycystic ovary syndrome   . Fatty liver   . Diabetes mellitus   . PONV (postoperative nausea and vomiting)   . History of kidney stones    Past Surgical History  Procedure Laterality Date  . Cholecystectomy  01/10/2011    Procedure: LAPAROSCOPIC CHOLECYSTECTOMY;  Surgeon: Jamesetta So;  Location: [facility];  Service: General;  Laterality: N/A;  . Liver biopsy  01/10/2011    Procedure: LIVER BIOPSY;  Surgeon: Jamesetta So;  Location: [facility];  Service: General;;  . Hydradenitis excision Right 06/18/2012    Procedure: EXCISION HYDRIADENITIS SUPRATIVA  RIGHT AXILLA;  Surgeon: Scherry Ran, MD;  Location: [facility];  Service: General;  Laterality: Right;  . Skin split graft Right 06/18/2012    Procedure: SKIN GRAFT SPLIT THICKNESS RIGHT AXILLA(WITH TWO STANDARD SKIN BOARDS 3"x 8" )  DONOR SITE RIGHT AND LEFT THIGHS;  Surgeon:  Scherry Ran, MD;  Location: [facility];  Service: General;  Laterality: Right;   Family History  Problem Relation Age of Onset  . Diabetes Father   . Hypertension Father   . Stroke Other   . Cancer Maternal Grandfather     unclear  . Cancer Paternal Grandfather    History  Substance Use Topics  . Smoking status: Current Every Day Smoker -- 0.50 packs/day    Types: Cigarettes  . Smokeless tobacco: Former Systems developer    Quit date: 06/12/2011  . Alcohol Use: Yes     Comment: occ   OB History   Grav Para Term Preterm Abortions TAB SAB Ect Mult Living                 Review of Systems  Gastrointestinal: Positive for abdominal pain.  All other systems reviewed and are negative.    Allergies  Benicar; Doxycycline; and Influenza vaccine live  Home Medications   Current Outpatient Rx  Name  Route  Sig  Dispense  Refill  . allopurinol (ZYLOPRIM) 300 MG tablet   Oral   Take 300 mg by mouth daily.         . colchicine 0.6 MG tablet   Oral   Take 0.6 mg by mouth daily as needed. gout         . dicyclomine (BENTYL) 10 MG capsule   Oral   Take 1 capsule (10 mg total) by mouth  3 (three) times daily as needed (abdominal cramps).   30 capsule   0   . docusate sodium 100 MG CAPS   Oral   Take 100 mg by mouth daily.   10 capsule   0   . HYDROcodone-acetaminophen (NORCO/VICODIN) 5-325 MG per tablet   Oral   Take 1 tablet by mouth every 6 (six) hours as needed.   30 tablet   0   . Linagliptin-Metformin HCl (JENTADUETO) 2.5-500 MG TABS   Oral   Take 1 tablet by mouth 2 (two) times daily.          . Multiple Vitamin (MULTIVITAMIN WITH MINERALS) TABS   Oral   Take 2 tablets by mouth daily.         Marland Kitchen omeprazole (PRILOSEC) 20 MG capsule   Oral   Take 20 mg by mouth 2 (two) times daily.          . ondansetron (ZOFRAN) 8 MG tablet   Oral   Take 8 mg by mouth every 8 (eight) hours as needed. For nausea         . pravastatin (PRAVACHOL) 20 MG tablet   Oral    Take 20 mg by mouth daily.         Marland Kitchen sulfamethoxazole-trimethoprim (BACTRIM DS) 800-160 MG per tablet   Oral   Take 1 tablet by mouth 2 (two) times daily. Patient starts on 06/12/2012         . valsartan-hydrochlorothiazide (DIOVAN-HCT) 160-25 MG per tablet   Oral   Take 1 tablet by mouth daily.          BP 106/55  Pulse 87 Physical Exam  Nursing note and vitals reviewed.  27 year old female, resting comfortably and in no acute distress. Vital signs are normal, but were not recorded until after the patient had received 2 L of IV fluid. Oxygen saturation is 98%, which is normal. Head is normocephalic and atraumatic. PERRLA, EOMI. Oropharynx is clear. Neck is nontender and supple without adenopathy or JVD. Back is nontender and there is no CVA tenderness. Lungs are clear without rales, wheezes, or rhonchi. Chest is nontender. Heart has regular rate and rhythm without murmur. Abdomen is soft, flat, nontender without masses or hepatosplenomegaly and peristalsis is hypoactive. Extremities have no cyanosis or edema, full range of motion is present. Skin is warm and dry without rash. Neurologic: Mental status is normal, cranial nerves are intact, there are no motor or sensory deficits.  ED Course   Procedures (including critical care time)  Results for orders placed during the hospital encounter of 08/17/12  CBC WITH DIFFERENTIAL      Result Value Range   WBC 12.2 (*) 4.0 - 10.5 K/uL   RBC 5.58 (*) 3.87 - 5.11 MIL/uL   Hemoglobin 17.0 (*) 12.0 - 15.0 g/dL   HCT 48.5 (*) 36.0 - 46.0 %   MCV 86.9  78.0 - 100.0 fL   MCH 30.5  26.0 - 34.0 pg   MCHC 35.1  30.0 - 36.0 g/dL   RDW 13.1  11.5 - 15.5 %   Platelets 361  150 - 400 K/uL   Neutrophils Relative % 65  43 - 77 %   Neutro Abs 7.9 (*) 1.7 - 7.7 K/uL   Lymphocytes Relative 27  12 - 46 %   Lymphs Abs 3.3  0.7 - 4.0 K/uL   Monocytes Relative 7  3 - 12 %   Monocytes Absolute 0.8  0.1 - 1.0 K/uL  Eosinophils Relative 1  0 - 5  %   Eosinophils Absolute 0.1  0.0 - 0.7 K/uL   Basophils Relative 0  0 - 1 %   Basophils Absolute 0.0  0.0 - 0.1 K/uL  COMPREHENSIVE METABOLIC PANEL      Result Value Range   Sodium 133 (*) 135 - 145 mEq/L   Potassium 3.5  3.5 - 5.1 mEq/L   Chloride 94 (*) 96 - 112 mEq/L   CO2 21  19 - 32 mEq/L   Glucose, Bld 242 (*) 70 - 99 mg/dL   BUN 11  6 - 23 mg/dL   Creatinine, Ser 0.69  0.50 - 1.10 mg/dL   Calcium 10.2  8.4 - 10.5 mg/dL   Total Protein 8.3  6.0 - 8.3 g/dL   Albumin 4.1  3.5 - 5.2 g/dL   AST 107 (*) 0 - 37 U/L   ALT 184 (*) 0 - 35 U/L   Alkaline Phosphatase 93  39 - 117 U/L   Total Bilirubin 0.6  0.3 - 1.2 mg/dL   GFR calc non Af Amer >90  >90 mL/min   GFR calc Af Amer >90  >90 mL/min  URINALYSIS, ROUTINE W REFLEX MICROSCOPIC      Result Value Range   Color, Urine YELLOW  YELLOW   APPearance CLEAR  CLEAR   Specific Gravity, Urine 1.020  1.005 - 1.030   pH 5.5  5.0 - 8.0   Glucose, UA >1000 (*) NEGATIVE mg/dL   Hgb urine dipstick TRACE (*) NEGATIVE   Bilirubin Urine NEGATIVE  NEGATIVE   Ketones, ur NEGATIVE  NEGATIVE mg/dL   Protein, ur NEGATIVE  NEGATIVE mg/dL   Urobilinogen, UA 0.2  0.0 - 1.0 mg/dL   Nitrite NEGATIVE  NEGATIVE   Leukocytes, UA NEGATIVE  NEGATIVE  URINE MICROSCOPIC-ADD ON      Result Value Range   Squamous Epithelial / LPF MANY (*) RARE   WBC, UA 0-2  <3 WBC/hpf   RBC / HPF 3-6  <3 RBC/hpf   Bacteria, UA FEW (*) RARE   Casts HYALINE CASTS (*) NEGATIVE  POCT PREGNANCY, URINE      Result Value Range   Preg Test, Ur NEGATIVE  NEGATIVE     1. Dehydration   2. Hypotension   3. Elevated liver function tests     MDM  Nausea, and dizziness, relative hypotension. This seems most consistent with a vasovagal reaction although there is no obvious precipitating cause. She will be given IV fluids, IV ondansetron and screening labs obtained. Old records are reviewed and she had unremarkable excision of hidradenitis suppurativa the right axilla one week  ago.  Workup is significant for mild elevation of transaminases. This is slightly worse than previous but review of old charts shows that the elevated transaminases date back to 2010. She did not get relief of nausea with ondansetron and was given metoclopramide with better symptom control of nausea. She felt better after IV fluids and wanted to try standing. However, when she stood up, she noticed that she was starting to feel dizzy again. She will be given additional IV fluids. She now tells me that she was recently started on Invokana for her diabetes and she has been taking it for only 5 days. Review of literature for Invokana does show that violent depletion and orthostatic hypotension is a significant side effect and this is likely the cause of her symptoms today.  After a second bolus, and she feels much better. Orthostatic  vital signs showed no significant change in blood pressure or pulse and she was no longer dizzy on standing. She is discharged with prescription for metoclopramide and is to followup with her PCP. She is to discuss whether she should have outpatient evaluation of persistently elevated liver function tests and also needs to discuss whether she should continue taking Invokana.   Delora Fuel, MD 03/18/79 1886

## 2012-08-17 NOTE — ED Notes (Signed)
Patient sat up on side of bed, increased dizziness and mild nausea.  Patient's face turned pale when she sat up.

## 2012-08-17 NOTE — ED Notes (Signed)
Patient states she feels better at this time; voices no complaints.

## 2014-11-07 ENCOUNTER — Other Ambulatory Visit (HOSPITAL_COMMUNITY): Payer: Self-pay | Admitting: Emergency Medicine

## 2014-11-07 ENCOUNTER — Emergency Department (HOSPITAL_COMMUNITY)
Admission: EM | Admit: 2014-11-07 | Discharge: 2014-11-07 | Discharge status: Absent without leave | Payer: 59 | Source: Home / Self Care

## 2014-11-07 ENCOUNTER — Ambulatory Visit (HOSPITAL_COMMUNITY)
Admission: RE | Admit: 2014-11-07 | Discharge: 2014-11-07 | Disposition: A | Discharge status: Home / Self Care | Payer: 59 | Source: Ambulatory Visit | Attending: Emergency Medicine | Admitting: Emergency Medicine

## 2014-11-07 ENCOUNTER — Ambulatory Visit (HOSPITAL_COMMUNITY)
Admission: AD | Admit: 2014-11-07 | Discharge: 2014-11-07 | Disposition: A | Discharge status: Home / Self Care | Payer: 59 | Source: Ambulatory Visit | Attending: Emergency Medicine | Admitting: Emergency Medicine

## 2014-11-07 DIAGNOSIS — M545 Low back pain, unspecified: Secondary | ICD-10-CM

## 2015-01-22 DIAGNOSIS — M545 Low back pain: Secondary | ICD-10-CM | POA: Diagnosis not present

## 2015-01-26 ENCOUNTER — Other Ambulatory Visit (HOSPITAL_COMMUNITY): Payer: Self-pay | Admitting: Internal Medicine

## 2015-01-26 DIAGNOSIS — M5441 Lumbago with sciatica, right side: Secondary | ICD-10-CM

## 2015-02-05 MED FILL — PIOGLITAZONE HCL 15 MG TAB: 15 | 30 days supply | Qty: 30 | Fill #2

## 2015-02-05 MED FILL — PRAVASTATIN NA 40 MG TAB: 40 | 30 days supply | Qty: 30 | Fill #2

## 2015-02-05 MED FILL — KOMBIGLYZE XR 2.5-1,000 MG: 2.5-1000 | 30 days supply | Qty: 30 | Fill #5

## 2015-02-05 MED FILL — ALLOPURINOL 300 MG TABLET: 300 | 30 days supply | Qty: 30 | Fill #2

## 2015-02-09 ENCOUNTER — Ambulatory Visit (HOSPITAL_COMMUNITY)
Admission: RE | Admit: 2015-02-09 | Discharge: 2015-02-09 | Disposition: A | Discharge status: Home / Self Care | Payer: 59 | Source: Ambulatory Visit | Attending: Internal Medicine | Admitting: Internal Medicine

## 2015-02-09 DIAGNOSIS — M5124 Other intervertebral disc displacement, thoracic region: Secondary | ICD-10-CM | POA: Insufficient documentation

## 2015-02-09 DIAGNOSIS — M545 Low back pain: Secondary | ICD-10-CM | POA: Diagnosis not present

## 2015-02-09 DIAGNOSIS — M4806 Spinal stenosis, lumbar region: Secondary | ICD-10-CM | POA: Diagnosis not present

## 2015-02-09 DIAGNOSIS — M5126 Other intervertebral disc displacement, lumbar region: Secondary | ICD-10-CM | POA: Diagnosis not present

## 2015-02-09 DIAGNOSIS — M5441 Lumbago with sciatica, right side: Secondary | ICD-10-CM

## 2015-02-23 DIAGNOSIS — E119 Type 2 diabetes mellitus without complications: Secondary | ICD-10-CM | POA: Diagnosis not present

## 2015-03-02 DIAGNOSIS — E119 Type 2 diabetes mellitus without complications: Secondary | ICD-10-CM | POA: Diagnosis not present

## 2015-03-02 DIAGNOSIS — R945 Abnormal results of liver function studies: Secondary | ICD-10-CM | POA: Diagnosis not present

## 2015-03-02 DIAGNOSIS — M545 Low back pain: Secondary | ICD-10-CM | POA: Diagnosis not present

## 2015-03-02 DIAGNOSIS — E782 Mixed hyperlipidemia: Secondary | ICD-10-CM | POA: Diagnosis not present

## 2015-03-02 DIAGNOSIS — M109 Gout, unspecified: Secondary | ICD-10-CM | POA: Diagnosis not present

## 2015-03-02 DIAGNOSIS — I10 Essential (primary) hypertension: Secondary | ICD-10-CM | POA: Diagnosis not present

## 2015-03-13 MED FILL — ALLOPURINOL 300 MG TABLET: 300 | 30 days supply | Qty: 30 | Fill #3

## 2015-03-13 MED FILL — PANTOPRAZOLE SOD DR 40 MG T: 40 | 90 days supply | Qty: 180 | Fill #3

## 2015-03-13 MED FILL — PRAVASTATIN NA 40 MG TAB: 40 | 30 days supply | Qty: 30 | Fill #3

## 2015-03-13 MED FILL — PIOGLITAZONE HCL 15 MG TAB: 15 | 30 days supply | Qty: 30 | Fill #3

## 2015-03-16 DIAGNOSIS — M545 Low back pain: Secondary | ICD-10-CM | POA: Diagnosis not present

## 2015-03-16 MED FILL — KOMBIGLYZE XR 2.5-1,000 MG: 2.5-1000 | 30 days supply | Qty: 30 | Fill #0

## 2015-04-12 ENCOUNTER — Ambulatory Visit (HOSPITAL_COMMUNITY): Payer: 59 | Admitting: Physical Therapy

## 2015-04-23 MED FILL — PIOGLITAZONE HCL 15 MG TAB: 15 | 30 days supply | Qty: 30 | Fill #4

## 2015-04-23 MED FILL — ALLOPURINOL 300 MG TABLET: 300 | 30 days supply | Qty: 30 | Fill #4

## 2015-04-23 MED FILL — PRAVASTATIN NA 40 MG TAB: 40 | 30 days supply | Qty: 30 | Fill #4

## 2015-04-23 MED FILL — KOMBIGLYZE XR 2.5-1,000 MG: 2.5-1000 | 30 days supply | Qty: 30 | Fill #1

## 2015-04-26 ENCOUNTER — Ambulatory Visit (HOSPITAL_COMMUNITY): Payer: 59 | Attending: Neurological Surgery | Admitting: Physical Therapy

## 2015-04-26 DIAGNOSIS — M5441 Lumbago with sciatica, right side: Secondary | ICD-10-CM | POA: Diagnosis not present

## 2015-04-26 DIAGNOSIS — R293 Abnormal posture: Secondary | ICD-10-CM | POA: Diagnosis not present

## 2015-04-26 DIAGNOSIS — M6281 Muscle weakness (generalized): Secondary | ICD-10-CM | POA: Diagnosis not present

## 2015-04-26 NOTE — Patient Instructions (Signed)
Backward Bend (Standing)    Arch backward to make hollow of back deeper. Hold ___2_ seconds. Repeat _5-10___ times per set. Do __1__ sets per session. Do _6-8___ sessions per day.  http://orth.exer.us/178   Copyright  VHI. All rights reserved.  Hamstring Stretch: Active    Support behind right knee. Starting with knee bent, attempt to straighten knee until a comfortable stretch is felt in back of thigh. Hold 30____ seconds. Repeat _3___ times per set. Do __1__ sets per session. Do _2___ sessions per day.  http://orth.exer.us/158   Copyright  VHI. All rights reserved.  Isometric Abdominal    Lying on back with knees bent, tighten stomach by pressing elbows down. Hold __3-5__ seconds. Repeat _10___ times per set. Do _1___ sets per session. Do ____2 sessions per day.  http://orth.exer.us/1086   Copyright  VHI. All rights reserved.  Bridging    Slowly raise buttocks from floor, keeping stomach tight. Repeat __10__ times per set. Do ___1_ sets per session. Do 2____ sessions per day.  http://orth.exer.us/1096   Copyright  VHI. All rights reserved.  Scapular Retraction (Standing)    With arms at sides, pinch shoulder blades together. Repeat __10__ times per set. Do _1___ sets per session. Do _2___ sessions per day.  http://orth.exer.us/944   Copyright  VHI. All rights reserved.

## 2015-04-26 NOTE — Therapy (Signed)
[facility] [address] [place], [place], [address] Phone: [phone]   Fax:  [phone]  Physical Therapy Evaluation  Patient Details  Name: Cheryl Black MRN: [id number] Date of Birth: Aug 04, 1985 Referring Provider: Marikay Alar   Encounter Date: 04/26/2015      PT End of Session - 04/26/15 1607    Visit Number 1   Number of Visits 8   Date for PT Re-Evaluation 05/26/15   Authorization Type UMR   Authorization - Visit Number 1   Authorization - Number of Visits 8   PT Start Time 1520   PT Stop Time 1600   PT Time Calculation (min) 40 min   Activity Tolerance Patient tolerated treatment well   Behavior During Therapy [facility] for tasks assessed/performed      Past Medical History  Diagnosis Date  . Fibrocystic breast disease   . Acid reflux   . Gout   . Polycystic ovary syndrome   . Fatty liver   . Diabetes mellitus   . PONV (postoperative nausea and vomiting)   . History of kidney stones     Past Surgical History  Procedure Laterality Date  . Cholecystectomy  01/10/2011    Procedure: LAPAROSCOPIC CHOLECYSTECTOMY;  Surgeon: Dalia Heading;  Location: [facility];  Service: General;  Laterality: N/A;  . Liver biopsy  01/10/2011    Procedure: LIVER BIOPSY;  Surgeon: Dalia Heading;  Location: [facility];  Service: General;;  . Hydradenitis excision Right 06/18/2012    Procedure: EXCISION HYDRIADENITIS SUPRATIVA  RIGHT AXILLA;  Surgeon: Marlane Hatcher, MD;  Location: [facility];  Service: General;  Laterality: Right;  . Skin split graft Right 06/18/2012    Procedure: SKIN GRAFT SPLIT THICKNESS RIGHT AXILLA(WITH TWO STANDARD SKIN BOARDS 3"x 8" )  DONOR SITE RIGHT AND LEFT THIGHS;  Surgeon: Marlane Hatcher, MD;  Location: [facility];  Service: General;  Laterality: Right;    There were no vitals filed for this visit.       Subjective Assessment - 04/26/15 1523    Subjective Cheryl Black states that she started having back pain last  November.    She was given medication which helped her pain.  She began having pain again in mid January but this time the pain medication has not helped.  At one time she was feeling the pain in her right buttock and thigh but the radiating pain has seemed to settle down.  She is having significant pain at the end of her work shift therefore she is being referred to skilled physical therapy.     Pertinent History DM   How long can you sit comfortably? in a comfortable she is able to sit for prolong period of time    How long can you stand comfortably? 30 minutes    How long can you walk comfortably? able to walk for about a mile    Patient Stated Goals to have no pain    Currently in Pain? No/denies; ( pt has been off of work)  highest pain has been is an 8/10 typically after work Her pain is mid back.  Activity seems to aggravate the pain.  Medication was helping the pain but this is no longer the case.             [facility] PT Assessment - 04/26/15 0001    Assessment   Medical Diagnosis lumbar pain    Referring Provider Marikay Alar    Onset Date/Surgical  Date 11/21/15   Prior Therapy none    Precautions   Precautions None   Restrictions   Weight Bearing Restrictions No   Balance Screen   Has the patient fallen in the past 6 months No   Has the patient had a decrease in activity level because of a fear of falling?  No   Is the patient reluctant to leave their home because of a fear of falling?  No   Prior Function   Vocation Full time employment   Journalist, newspaper; up and down    Leisure walking, read   Cognition   Overall Cognitive Status Within Functional Limits for tasks assessed   Observation/Other Assessments   Focus on Therapeutic Outcomes (FOTO)  61   ROM / Strength   AROM / PROM / Strength AROM;Strength   AROM   AROM Assessment Site Lumbar   Lumbar Extension 22   Lumbar - Right Side Bend 10   Lumbar - Left Side Bend 20   Strength   Strength  Assessment Site Hip;Knee;Ankle   Right/Left Hip Right;Left   Right Hip Flexion 4+/5   Right Hip Extension 4/5   Right Hip ABduction 4/5   Left Hip Flexion 5/5   Left Hip Extension 4/5   Right/Left Knee Right;Left   Right Knee Flexion 4+/5   Right Knee Extension 4-/5   Left Knee Flexion 5/5   Left Knee Extension 5/5   Right/Left Ankle Right;Left   Right Ankle Dorsiflexion 4+/5   Left Ankle Dorsiflexion 5/5                   [facility] Adult PT Treatment/Exercise - 04/26/15 0001    Posture/Postural Control   Posture/Postural Control Postural limitations   Postural Limitations Rounded Shoulders;Forward head;Increased lumbar lordosis;Increased thoracic kyphosis   Exercises   Exercises Lumbar   Lumbar Exercises: Stretches   Active Hamstring Stretch 3 reps;30 seconds   Standing Extension 5 reps   Lumbar Exercises: Standing   Scapular Retraction Right;10 reps   Lumbar Exercises: Supine   Ab Set 10 reps   Bridge 10 reps                PT Education - 04/26/15 1605    Education provided Yes   Education Details the benefits of walking; the need for good body mechanics and HEP    Person(s) Educated Patient   Methods Explanation;Demonstration;Handout   Comprehension Verbalized understanding;Returned demonstration          PT Short Term Goals - 04/26/15 1613    PT SHORT TERM GOAL #1   Title Pt to verbalize the importance of proper posture for back care.   Time 2   Period Weeks   Status New   PT SHORT TERM GOAL #2   Title Pt to verbalize the importance of proper body mechanics for back care.   Time 2   Period Weeks   Status New   PT SHORT TERM GOAL #3   Title Patient to be independent in a stretching and a lumbar stabilization exercise program for improved core strength and improve lumbar motion to allow patient to sleep on her right side without increased pain.    Time 2   Period Weeks   Status New           PT Long Term Goals - 04/26/15 1616    PT  LONG TERM GOAL #1   Title Pt core to be increased one grade and her right  lower extremity to be 5/5 to allow patient to complete work duties without having any increased low back pain.    Time 4   Period Weeks   Status New   PT LONG TERM GOAL #2   Title Pt to verbalize that she is able to sit in a car for two hours and get out without any increased low back pain    Time 4   Period Weeks   Status New   PT LONG TERM GOAL #3   Title Patient to be able to bend or stoop down to pick an item off the floor with ease    Time 4   Period Weeks   Status New   PT LONG TERM GOAL #4   Title Patient to be able to complete three hours of heavy housework such as sweeping, vacuuming, mopping without having any back pain.    Time 4   Period Weeks   Status New               Plan - 04/26/15 1607    Clinical Impression Statement Cheryl Black is a 30 yo female who has had low back pain since November.  Initially  medication was controlling her back pain but at this time the medication does not seem to be helping.  Therefore,  her MD referred her to skilled physical therapy.  Examination demonstrates decreased ROM, decreased strength, postural dysfunction and increased pain.  Cheryl Black will benefit from skilled physical therapy to educate her  in a lumbar stabilization program,  body mechanics for proper lifting, and stretching.     Rehab Potential Good   PT Frequency 2x / week   PT Duration 4 weeks   PT Treatment/Interventions ADLs/Self Care Home Management;Ultrasound;Patient/family education;Manual techniques;Functional mobility training;Therapeutic activities;Therapeutic exercise;Cryotherapy   PT Next Visit Plan Begin 3 D hip excursion, heel raises, minisquats, bent knee raise and hip abduction.  Pt will need exercise sheets for these activities.  Progress to prone then quadriped and standing stabilization exercises followed by lifting education.    Consulted and Agree with Plan of Care Patient       Patient will benefit from skilled therapeutic intervention in order to improve the following deficits and impairments:  Decreased activity tolerance, Decreased range of motion, Decreased strength, Difficulty walking, Pain, Postural dysfunction  Visit Diagnosis: Lumbago with sciatica, right side - Plan: PT plan of care cert/re-cert  Muscle weakness (generalized) - Plan: PT plan of care cert/re-cert  Abnormal posture - Plan: PT plan of care cert/re-cert     Problem List Patient Active Problem List   Diagnosis Date Noted  . C. difficile enteritis 02/01/2012  . Hyponatremia 01/07/2011  . Hypokalemia 01/07/2011  . Elevated LFTs 01/07/2011  . Hypomagnesemia 01/07/2011  . Diabetes mellitus type 2 in obese (HCC) 01/06/2011  . Morbid obesity (HCC) 01/06/2011  . Polycystic ovary disease 01/06/2011  . Tobacco abuse 01/06/2011  . FATTY LIVER DISEASE 06/08/2008  . TRANSAMINASES, SERUM, ELEVATED 06/08/2008  Cheryl Black, PT CLT [phone] 04/26/2015, 4:28 PM  [facility] [address] [place], [place], [address] Phone: [phone]   Fax:  [phone]  Name: Cheryl Black MRN: [id number] Date of Birth: 04/21/1985

## 2015-04-27 ENCOUNTER — Ambulatory Visit (HOSPITAL_COMMUNITY): Payer: 59

## 2015-04-27 DIAGNOSIS — M6281 Muscle weakness (generalized): Secondary | ICD-10-CM | POA: Diagnosis not present

## 2015-04-27 DIAGNOSIS — M5441 Lumbago with sciatica, right side: Secondary | ICD-10-CM

## 2015-04-27 DIAGNOSIS — R293 Abnormal posture: Secondary | ICD-10-CM

## 2015-04-27 NOTE — Patient Instructions (Signed)
PELVIC STABILIZATION: Bent knee raise    With knees bent over hips and calves parallel to floor, inhale and place toes of both feet on floor. Keep low back pressed to floor. Exhaling, bring one knee up at a time keeping stomach tight. Repeat 10-15 times with 5" holds. Do 1-2 times per day.  Copyright  VHI. All rights reserved.    Abduction    Lift leg up toward ceiling. Return.  Repeat 10-20 times each leg. Do 1-2 sessions per day.  http://gt2.exer.us/386   Copyright  VHI. All rights reserved.    Toe / Heel Raise (Standing)    Standing with support, raise heels, then rock back on heels and raise toes. Repeat 10-20 times.  Copyright  VHI. All rights reserved.   FUNCTIONAL MOBILITY: Squat    Stance: shoulder-width on floor. Bend hips and knees. Keep back straight. Do not allow knees to bend past toes. Squeeze glutes and quads to stand. 10-20 reps per set, 1-2 sets per day, 3-5days per week  Copyright  VHI. All rights reserved.

## 2015-04-27 NOTE — Therapy (Signed)
[place], [place], [address] Phone: [phone]   Fax:  [phone]  Physical Therapy Treatment  Patient Details  Name: Cheryl Black MRN: [id number] Date of Birth: [birth year] Referring Provider: Sherley Bounds   Encounter Date: 04/27/2015      PT End of Session - 04/27/15 0846    Visit Number 2   Number of Visits 8   Date for PT Re-Evaluation 05/26/15   Authorization Type UMR   Authorization - Visit Number 2   Authorization - Number of Visits 8   PT Start Time 0813   PT Stop Time 0853   PT Time Calculation (min) 40 min   Activity Tolerance Patient tolerated treatment well   Behavior During Therapy [facility] for tasks assessed/performed      Past Medical History  Diagnosis Date  . Fibrocystic breast disease   . Acid reflux   . Gout   . Polycystic ovary syndrome   . Fatty liver   . Diabetes mellitus   . PONV (postoperative nausea and vomiting)   . History of kidney stones     Past Surgical History  Procedure Laterality Date  . Cholecystectomy  01/10/2011    Procedure: LAPAROSCOPIC CHOLECYSTECTOMY;  Surgeon: Jamesetta So;  Location: [facility];  Service: General;  Laterality: N/A;  . Liver biopsy  01/10/2011    Procedure: LIVER BIOPSY;  Surgeon: Jamesetta So;  Location: [facility];  Service: General;;  . Hydradenitis excision Right 06/18/2012    Procedure: EXCISION HYDRIADENITIS SUPRATIVA  RIGHT AXILLA;  Surgeon: Scherry Ran, MD;  Location: [facility];  Service: General;  Laterality: Right;  . Skin split graft Right 06/18/2012    Procedure: SKIN GRAFT SPLIT THICKNESS RIGHT AXILLA(WITH TWO STANDARD SKIN BOARDS 3"x 8" )  DONOR SITE RIGHT AND LEFT THIGHS;  Surgeon: Scherry Ran, MD;  Location: [facility];  Service: General;  Laterality: Right;    There were no vitals filed for this visit.      Subjective Assessment - 04/27/15 0813    Subjective Pt stated compliance with HEP daily with no questions concerning.   Reports LBP right sided pain scale 2/10   Pertinent History DM   Patient Stated Goals to have no pain    Currently in Pain? Yes   Pain Score 2    Pain Location Back   Pain Orientation Lower;Right   Pain Descriptors / Indicators Sharp   Pain Type Chronic pain   Pain Onset More than a month ago   Pain Frequency Intermittent   Aggravating Factors  a lot of standing activity   Pain Relieving Factors resting, tylonel                         [facility] Adult PT Treatment/Exercise - 04/27/15 0001    Lumbar Exercises: Stretches   Active Hamstring Stretch 3 reps;30 seconds   Active Hamstring Stretch Limitations supine   Standing Extension Limitations   Standing Extension Limitations 10 reps   Lumbar Exercises: Standing   Heel Raises 10 reps   Heel Raises Limitations Toe raises   Functional Squats 10 reps   Functional Squats Limitations minisquats infront of mat for proper form   Scapular Retraction Both;10 reps   Other Standing Lumbar Exercises 3D hip excursion 10x    Lumbar Exercises: Seated   LAQ on Ball Limitations Educated importance of proper posture   Lumbar Exercises: Supine   Ab Set 10  reps   AB Set Limitations 5" holds with verbal and tactile cueing for proper activation   Bent Knee Raise 10 reps;5 seconds   Bridge 10 reps   Other Supine Lumbar Exercises --                PT Education - 04/26/15 1605    Education provided Yes   Education Details the benefits of walking; the need for good body mechanics and HEP    Person(s) Educated Patient   Methods Explanation;Demonstration;Handout   Comprehension Verbalized understanding;Returned demonstration          PT Short Term Goals - 04/26/15 1613    PT SHORT TERM GOAL #1   Title Pt to verbalize the importance of proper posture for back care.   Time 2   Period Weeks   Status New   PT SHORT TERM GOAL #2   Title Pt to verbalize the importance of proper body mechanics for back care.   Time 2    Period Weeks   Status New   PT SHORT TERM GOAL #3   Title Patient to be independent in a stretching and a lumbar stabilization exercise program for improved core strength and improve lumbar motion to allow patient to sleep on her right side without increased pain.    Time 2   Period Weeks   Status New           PT Long Term Goals - 04/26/15 1616    PT LONG TERM GOAL #1   Title Pt core to be increased one grade and her right lower extremity to be 5/5 to allow patient to complete work duties without having any increased low back pain.    Time 4   Period Weeks   Status New   PT LONG TERM GOAL #2   Title Pt to verbalize that she is able to sit in a car for two hours and get out without any increased low back pain    Time 4   Period Weeks   Status New   PT LONG TERM GOAL #3   Title Patient to be able to bend or stoop down to pick an item off the floor with ease    Time 4   Period Weeks   Status New   PT LONG TERM GOAL #4   Title Patient to be able to complete three hours of heavy housework such as sweeping, vacuuming, mopping without having any back pain.    Time 4   Period Weeks   Status New               Plan - 04/27/15 8676    Clinical Impression Statement Reviewed goals, complaince and assured correct technique with HEP and copy given of eval given to pt.  Session focus on improving core stability and functional stretches/exercises to improve mobilty.  Pt able to demionstrate appropriate form with all exercises following therapist facilitation to improve form and technique.  Pt given advanced HEP this session.  Recommend squats done infront of chair to assure correct form at home.  Noted core and LE instability with increased cueing for TrA activation and valgus with squats.  No reports of increased pain thrpugh session.   Rehab Potential Good   PT Frequency 2x / week   PT Duration 4 weeks   PT Treatment/Interventions ADLs/Self Care Home  Management;Ultrasound;Patient/family education;Manual techniques;Functional mobility training;Therapeutic activities;Therapeutic exercise;Cryotherapy   PT Next Visit Plan Continue with 3 D hip excursion, heel raises,  minisquats, bent knee raise and hip abduction to assure correct form and technique with new HEP.  Progress to prone then quadriped and standing stabilization exercises followed by lifting education.    PT Home Exercise Plan Given: 3 D hip excursion, heel raises, minisquats, bent knee raise and hip abduction      Patient will benefit from skilled therapeutic intervention in order to improve the following deficits and impairments:  Decreased activity tolerance, Decreased range of motion, Decreased strength, Difficulty walking, Pain, Postural dysfunction  Visit Diagnosis: Lumbago with sciatica, right side  Muscle weakness (generalized)  Abnormal posture     Problem List Patient Active Problem List   Diagnosis Date Noted  . C. difficile enteritis 02/01/2012  . Hyponatremia 01/07/2011  . Hypokalemia 01/07/2011  . Elevated LFTs 01/07/2011  . Hypomagnesemia 01/07/2011  . Diabetes mellitus type 2 in obese ([place]) 01/06/2011  . Morbid obesity ([place]) 01/06/2011  . Polycystic ovary disease 01/06/2011  . Tobacco abuse 01/06/2011  . FATTY LIVER DISEASE 06/08/2008  . TRANSAMINASES, SERUM, ELEVATED 06/08/2008   Ihor Austin, LPTA; CBIS [phone]  Aldona Lento 04/27/2015, 8:58 AM  [place], [place], [address] Phone: [phone]   Fax:  [phone]  Name: Cheryl Black MRN: [id number] Date of Birth: [birth year]

## 2015-04-30 ENCOUNTER — Ambulatory Visit (HOSPITAL_COMMUNITY): Payer: 59 | Admitting: Physical Therapy

## 2015-04-30 DIAGNOSIS — R293 Abnormal posture: Secondary | ICD-10-CM | POA: Diagnosis not present

## 2015-04-30 DIAGNOSIS — M6281 Muscle weakness (generalized): Secondary | ICD-10-CM | POA: Diagnosis not present

## 2015-04-30 DIAGNOSIS — M5441 Lumbago with sciatica, right side: Secondary | ICD-10-CM

## 2015-04-30 NOTE — Therapy (Signed)
[place], [place], [address] Phone: [phone]   Fax:  [phone]  Physical Therapy Treatment  Patient Details  Name: Cheryl Black MRN: [id number] Date of Birth: 06-Apr-1985 Referring Provider: Sherley Bounds   Encounter Date: 04/30/2015      PT End of Session - 04/30/15 0853    Visit Number 3   Number of Visits 8   Date for PT Re-Evaluation 05/26/15   Authorization Type UMR   Authorization - Visit Number 3   Authorization - Number of Visits 8   PT Start Time 0815   PT Stop Time 0856   PT Time Calculation (min) 41 min   Activity Tolerance Patient tolerated treatment well   Behavior During Therapy [facility] for tasks assessed/performed      Past Medical History  Diagnosis Date  . Fibrocystic breast disease   . Acid reflux   . Gout   . Polycystic ovary syndrome   . Fatty liver   . Diabetes mellitus   . PONV (postoperative nausea and vomiting)   . History of kidney stones     Past Surgical History  Procedure Laterality Date  . Cholecystectomy  01/10/2011    Procedure: LAPAROSCOPIC CHOLECYSTECTOMY;  Surgeon: Jamesetta So;  [facility];  Service: General;  Laterality: N/A;  . Liver biopsy  01/10/2011    Procedure: LIVER BIOPSY;  Surgeon: Jamesetta So;  [facility];  Service: General;;  . Hydradenitis excision Right 06/18/2012    Procedure: EXCISION HYDRIADENITIS SUPRATIVA  RIGHT AXILLA;  Surgeon: Scherry Ran, MD;  Location: [facility];  Service: General;  Laterality: Right;  . Skin split graft Right 06/18/2012    Procedure: SKIN GRAFT SPLIT THICKNESS RIGHT AXILLA(WITH TWO STANDARD SKIN BOARDS 3"x 8" )  DONOR SITE RIGHT AND LEFT THIGHS;  Surgeon: Scherry Ran, MD;  Location: [facility];  Service: General;  Laterality: Right;    There were no vitals filed for this visit.      Subjective Assessment - 04/30/15 0825    Subjective Pt states she is not hurting too bad today, 1-2/10.  States she's  been doing her HEP, however did not do them on Saturday because her quads were sore from the new exericses.     Currently in Pain? Yes   Pain Score 2    Pain Location Back   Pain Orientation Lower;Right   Pain Descriptors / Indicators [facility] Adult PT Treatment/Exercise - 04/30/15 0820    Lumbar Exercises: Stretches   Active Hamstring Stretch 3 reps;30 seconds   Active Hamstring Stretch Limitations standing 12" step   Standing Extension Limitations   Standing Extension Limitations 10 reps   Lumbar Exercises: Standing   Heel Raises 10 reps   Heel Raises Limitations Toe raises   Functional Squats 10 reps   Functional Squats Limitations minisquats infront of mat for proper form   Scapular Retraction Both;10 reps;Strengthening;Theraband   Theraband Level (Scapular Retraction) Level 2 (Red)   Row 10 reps;Strengthening;Theraband   Theraband Level (Row) Level 2 (Red)   Shoulder Extension 10 reps;Strengthening;Theraband   Theraband Level (Shoulder Extension) Level 2 (Red)   Lumbar Exercises: Supine   Ab Set 10 reps   AB Set Limitations 5" holds with verbal and tactile cueing for proper activation   Bent Knee Raise 10 reps;5  seconds   Bridge 10 reps   Straight Leg Raise 10 reps   Lumbar Exercises: Sidelying   Hip Abduction 10 reps;Limitations   Hip Abduction Limitations bilaterally   Lumbar Exercises: Prone   Single Arm Raise 10 reps   Single Arm Raise Weights (lbs) alternating   Straight Leg Raise 10 reps   Straight Leg Raises Limitations alternating   Other Prone Lumbar Exercises rows and extensions 10 reps                  PT Short Term Goals - 04/26/15 1613    PT SHORT TERM GOAL #1   Title Pt to verbalize the importance of proper posture for back care.   Time 2   Period Weeks   Status New   PT SHORT TERM GOAL #2   Title Pt to verbalize the importance of proper body mechanics for back care.   Time 2   Period Weeks    Status New   PT SHORT TERM GOAL #3   Title Patient to be independent in a stretching and a lumbar stabilization exercise program for improved core strength and improve lumbar motion to allow patient to sleep on her right side without increased pain.    Time 2   Period Weeks   Status New           PT Long Term Goals - 04/26/15 1616    PT LONG TERM GOAL #1   Title Pt core to be increased one grade and her right lower extremity to be 5/5 to allow patient to complete work duties without having any increased low back pain.    Time 4   Period Weeks   Status New   PT LONG TERM GOAL #2   Title Pt to verbalize that she is able to sit in a car for two hours and get out without any increased low back pain    Time 4   Period Weeks   Status New   PT LONG TERM GOAL #3   Title Patient to be able to bend or stoop down to pick an item off the floor with ease    Time 4   Period Weeks   Status New   PT LONG TERM GOAL #4   Title Patient to be able to complete three hours of heavy housework such as sweeping, vacuuming, mopping without having any back pain.    Time 4   Period Weeks   Status New               Plan - 04/30/15 2841    Clinical Impression Statement Continued with review of HEP to ensure correct form and independence.  Progressed with tband postural strengthening and prone stabilization therex. Pt with noted weakness in lumbar and hip musculature.  Instructed with alternative hamstring stretch in standing with therapist facilitation for form.  No pain reported at end of session.     Rehab Potential Good   PT Frequency 2x / week   PT Duration 4 weeks   PT Treatment/Interventions ADLs/Self Care Home Management;Ultrasound;Patient/family education;Manual techniques;Functional mobility training;Therapeutic activities;Therapeutic exercise;Cryotherapy   PT Next Visit Plan Continue with current POC.   Progress to prone then quadriped and standing stabilization exercises followed by  lifting education.    PT Home Exercise Plan reviewed, no new exercises given      Patient will benefit from skilled therapeutic intervention in order to improve the following deficits and impairments:  Decreased activity tolerance, Decreased range  of motion, Decreased strength, Difficulty walking, Pain, Postural dysfunction  Visit Diagnosis: Lumbago with sciatica, right side  Muscle weakness (generalized)  Abnormal posture     Problem List Patient Active Problem List   Diagnosis Date Noted  . C. difficile enteritis 02/01/2012  . Hyponatremia 01/07/2011  . Hypokalemia 01/07/2011  . Elevated LFTs 01/07/2011  . Hypomagnesemia 01/07/2011  . Diabetes mellitus type 2 in obese ([place]) 01/06/2011  . Morbid obesity ([place]) 01/06/2011  . Polycystic ovary disease 01/06/2011  . Tobacco abuse 01/06/2011  . FATTY LIVER DISEASE 06/08/2008  . TRANSAMINASES, SERUM, ELEVATED 06/08/2008    Teena Irani, PTA/CLT [phone]  04/30/2015, 11:12 AM  [place], [place], [address] Phone: [phone]   Fax:  [phone]  Name: Cheryl Black MRN: [id number] Date of Birth: Dec 06, 1985

## 2015-05-03 ENCOUNTER — Telehealth (HOSPITAL_COMMUNITY): Payer: Self-pay

## 2015-05-03 NOTE — Telephone Encounter (Signed)
Called and left message informing of apt scheduled at 530 tomorrow, offered earlier apts tomorrow at 1115 or 230.  Contact info given.  [address]Nalina Yeatman, LPTA; CBIS [phone]

## 2015-05-04 ENCOUNTER — Ambulatory Visit (HOSPITAL_COMMUNITY): Payer: 59

## 2015-05-04 DIAGNOSIS — M6281 Muscle weakness (generalized): Secondary | ICD-10-CM

## 2015-05-04 DIAGNOSIS — R293 Abnormal posture: Secondary | ICD-10-CM

## 2015-05-04 DIAGNOSIS — M5441 Lumbago with sciatica, right side: Secondary | ICD-10-CM

## 2015-05-04 NOTE — Therapy (Signed)
[facility] [facility] [address] [place], [place], [address] Phone: [phone]   Fax:  [phone]  Physical Therapy Treatment  Patient Details  Name: Cheryl Black MRN: [id number] Date of Birth: 25-Aug-1985 Referring Provider: Marikay Alar   Encounter Date: 05/04/2015      PT End of Session - 05/04/15 1441    Visit Number 4   Number of Visits 8   Date for PT Re-Evaluation 05/26/15   Authorization Type UMR   Authorization - Visit Number 4   Authorization - Number of Visits 8   PT Start Time 1435   PT Stop Time 1518   PT Time Calculation (min) 43 min   Activity Tolerance Patient tolerated treatment well   Behavior During Therapy [facility] for tasks assessed/performed      Past Medical History  Diagnosis Date  . Fibrocystic breast disease   . Acid reflux   . Gout   . Polycystic ovary syndrome   . Fatty liver   . Diabetes mellitus   . PONV (postoperative nausea and vomiting)   . History of kidney stones     Past Surgical History  Procedure Laterality Date  . Cholecystectomy  01/10/2011    Procedure: LAPAROSCOPIC CHOLECYSTECTOMY;  Surgeon: Dalia Heading;  Location: [facility];  Service: General;  Laterality: N/A;  . Liver biopsy  01/10/2011    Procedure: LIVER BIOPSY;  Surgeon: Dalia Heading;  Location: [facility];  Service: General;;  . Hydradenitis excision Right 06/18/2012    Procedure: EXCISION HYDRIADENITIS SUPRATIVA  RIGHT AXILLA;  Surgeon: Marlane Hatcher, MD;  Location: [facility];  Service: General;  Laterality: Right;  . Skin split graft Right 06/18/2012    Procedure: SKIN GRAFT SPLIT THICKNESS RIGHT AXILLA(WITH TWO STANDARD SKIN BOARDS 3"x 8" )  DONOR SITE RIGHT AND LEFT THIGHS;  Surgeon: Marlane Hatcher, MD;  Location: [facility];  Service: General;  Laterality: Right;    There were no vitals filed for this visit.      Subjective Assessment - 05/04/15 1440    Subjective Pt stated she is feeling good today, no reports of pain.  Reports  compliance with HEP daily.     Pertinent History DM   Patient Stated Goals to have no pain    Currently in Pain? No/denies                         [facility] Adult PT Treatment/Exercise - 05/04/15 0001    Lumbar Exercises: Standing   Heel Raises 15 reps   Heel Raises Limitations Toe raises   Functional Squats 15 reps   Scapular Retraction Both;10 reps;Theraband   Theraband Level (Scapular Retraction) Level 3 (Green)   Row 10 reps;Strengthening;Theraband   Theraband Level (Row) Level 3 (Green)   Shoulder Extension 10 reps;Strengthening;Theraband   Theraband Level (Shoulder Extension) Level 3 (Green)   Other Standing Lumbar Exercises 3D hip excursion 10x    Lumbar Exercises: Supine   Bent Knee Raise Limitations HEP   Bridge Limitations HEP   Lumbar Exercises: Sidelying   Hip Abduction 15 reps   Hip Abduction Limitations HEP   Lumbar Exercises: Prone   Single Arm Raise 15 reps   Single Arm Raise Weights (lbs) alternating   Straight Leg Raise 15 reps   Straight Leg Raises Limitations alternating   Opposite Arm/Leg Raise Right arm/Left leg;Left arm/Right leg;5 reps   Other Prone Lumbar Exercises rows and shoulder extension 15 reps  PT Short Term Goals - 04/26/15 1613    PT SHORT TERM GOAL #1   Title Pt to verbalize the importance of proper posture for back care.   Time 2   Period Weeks   Status New   PT SHORT TERM GOAL #2   Title Pt to verbalize the importance of proper body mechanics for back care.   Time 2   Period Weeks   Status New   PT SHORT TERM GOAL #3   Title Patient to be independent in a stretching and a lumbar stabilization exercise program for improved core strength and improve lumbar motion to allow patient to sleep on her right side without increased pain.    Time 2   Period Weeks   Status New           PT Long Term Goals - 04/26/15 1616    PT LONG TERM GOAL #1   Title Pt core to be increased one grade and her right lower  extremity to be 5/5 to allow patient to complete work duties without having any increased low back pain.    Time 4   Period Weeks   Status New   PT LONG TERM GOAL #2   Title Pt to verbalize that she is able to sit in a car for two hours and get out without any increased low back pain    Time 4   Period Weeks   Status New   PT LONG TERM GOAL #3   Title Patient to be able to bend or stoop down to pick an item off the floor with ease    Time 4   Period Weeks   Status New   PT LONG TERM GOAL #4   Title Patient to be able to complete three hours of heavy housework such as sweeping, vacuuming, mopping without having any back pain.    Time 4   Period Weeks   Status New               Plan - 05/04/15 1516    Clinical Impression Statement Session focus on improving lumbar stability and posture strengthening.  Progressed to opposite UE/LE with cueing for core stability and increased resistance to green theraband with postural strengthening activity.  Noted weak core with cueing for stabiltiy through session and min tactile cueing for theraband exercises.  No reports of increased pain through session.  Out of stock with green therabands, give to pt, for additional HEP exercises when in stock and able to demonstrate appropriate form.     Rehab Potential Good   PT Frequency 2x / week   PT Duration 4 weeks   PT Treatment/Interventions ADLs/Self Care Home Management;Ultrasound;Patient/family education;Manual techniques;Functional mobility training;Therapeutic activities;Therapeutic exercise;Cryotherapy   PT Next Visit Plan Progress to quadruped exercises and standing stabilization exercises prior lifting education.  Next session begin combo with squats and heel rasies and prone to quadruped.   PT Home Exercise Plan give theraband when in stock (currently without)      Patient will benefit from skilled therapeutic intervention in order to improve the following deficits and impairments:   Decreased activity tolerance, Decreased range of motion, Decreased strength, Difficulty walking, Pain, Postural dysfunction  Visit Diagnosis: Lumbago with sciatica, right side  Muscle weakness (generalized)  Abnormal posture     Problem List Patient Active Problem List   Diagnosis Date Noted  . C. difficile enteritis 02/01/2012  . Hyponatremia 01/07/2011  . Hypokalemia 01/07/2011  . Elevated LFTs 01/07/2011  .  Hypomagnesemia 01/07/2011  . Diabetes mellitus type 2 in obese ([facility]) 01/06/2011  . Morbid obesity ([facility]) 01/06/2011  . Polycystic ovary disease 01/06/2011  . Tobacco abuse 01/06/2011  . FATTY LIVER DISEASE 06/08/2008  . TRANSAMINASES, SERUM, ELEVATED 06/08/2008   Becky Saxasey Marteze Vecchio, LPTA; CBIS [phone]  Juel BurrowCockerham, Dafney Farler Jo 05/04/2015, 3:51 PM  [place] [facility] [address] [place], [place], [address] Phone: [phone]   Fax:  [phone]  Name: Cheryl Black MRN: [id number] Date of Birth: 02/01/1985

## 2015-05-08 ENCOUNTER — Ambulatory Visit (HOSPITAL_COMMUNITY): Payer: 59 | Attending: Neurological Surgery

## 2015-05-08 DIAGNOSIS — R293 Abnormal posture: Secondary | ICD-10-CM | POA: Insufficient documentation

## 2015-05-08 DIAGNOSIS — M6281 Muscle weakness (generalized): Secondary | ICD-10-CM | POA: Diagnosis not present

## 2015-05-08 DIAGNOSIS — M5441 Lumbago with sciatica, right side: Secondary | ICD-10-CM | POA: Diagnosis not present

## 2015-05-08 NOTE — Therapy (Signed)
[place], [place], [address] Phone: [phone]   Fax:  [phone]  Physical Therapy Treatment  Patient Details  Name: Cheryl Black MRN: [id number] Date of Birth: 1985-10-07 Referring Provider: Sherley Bounds   Encounter Date: 05/08/2015      PT End of Session - 05/08/15 1647    Visit Number 5   Number of Visits 8   Date for PT Re-Evaluation 05/26/15   Authorization Type UMR   Authorization - Visit Number 5   Authorization - Number of Visits 8   PT Start Time 6283   PT Stop Time 1726   PT Time Calculation (min) 41 min   Activity Tolerance Patient tolerated treatment well   Behavior During Therapy [facility] for tasks assessed/performed      Past Medical History  Diagnosis Date  . Fibrocystic breast disease   . Acid reflux   . Gout   . Polycystic ovary syndrome   . Fatty liver   . Diabetes mellitus   . PONV (postoperative nausea and vomiting)   . History of kidney stones     Past Surgical History  Procedure Laterality Date  . Cholecystectomy  01/10/2011    Procedure: LAPAROSCOPIC CHOLECYSTECTOMY;  Surgeon: Jamesetta So;  Location: [facility];  Service: General;  Laterality: N/A;  . Liver biopsy  01/10/2011    Procedure: LIVER BIOPSY;  Surgeon: Jamesetta So;  Location: [facility];  Service: General;;  . Hydradenitis excision Right 06/18/2012    Procedure: EXCISION HYDRIADENITIS SUPRATIVA  RIGHT AXILLA;  Surgeon: Scherry Ran, MD;  Location: [facility];  Service: General;  Laterality: Right;  . Skin split graft Right 06/18/2012    Procedure: SKIN GRAFT SPLIT THICKNESS RIGHT AXILLA(WITH TWO STANDARD SKIN BOARDS 3"x 8" )  DONOR SITE RIGHT AND LEFT THIGHS;  Surgeon: Scherry Ran, MD;  Location: [facility];  Service: General;  Laterality: Right;    There were no vitals filed for this visit.      Subjective Assessment - 05/08/15 1643    Subjective Pt stated she has a slight increased pain Rt side lower back  following work especially with sit to stands, pain scale 2/10   Pertinent History DM   Patient Stated Goals to have no pain    Pain Score 2    Pain Location Back   Pain Orientation Lower;Right   Pain Descriptors / Indicators Sharp   Pain Type Chronic pain   Pain Radiating Towards no radicular symptoms   Pain Onset More than a month ago   Pain Frequency Intermittent   Aggravating Factors  a lot of standing activity, following sit to standing   Pain Relieving Factors resting, tylonel            [facility] Adult PT Treatment/Exercise - 05/08/15 0001    Lumbar Exercises: Standing   Heel Raises Limitations proper lifting yellow ball then heel raise   Lifting From 12";10 reps  proper lifting yellow ball then heel raise   Scapular Retraction Both;15 reps   Theraband Level (Scapular Retraction) Level 3 (Green)   Row 15 reps   Theraband Level (Row) Level 3 (Green)   Shoulder Extension 15 reps   Theraband Level (Shoulder Extension) Level 3 (Green)   Lumbar Exercises: Supine   Ab Set 10 reps   AB Set Limitations core bracing   Bridge 10 reps   Bridge Limitations Rt LE closer to butt following MET   Straight Leg Raise 10  reps   Straight Leg Raises Limitations core bracing with UE press down with theraband following MET   Lumbar Exercises: Prone   Single Arm Raise 15 reps   Single Arm Raise Weights (lbs) alternating   Other Prone Lumbar Exercises rows and shoulder extension 15 reps   Lumbar Exercises: Quadruped   Single Arm Raise Right;Left;5 reps;5 seconds   Straight Leg Raise 5 reps;5 seconds   Manual Therapy   Manual Therapy Muscle Energy Technique   Manual therapy comments manual complete separate rest of treatment, MET complete initial session today   Muscle Energy Technique Rt SI anterior rotation f/b core strengtheing exercises                  PT Short Term Goals - 04/26/15 1613    PT SHORT TERM GOAL #1   Title Pt to verbalize the importance of proper posture for  back care.   Time 2   Period Weeks   Status New   PT SHORT TERM GOAL #2   Title Pt to verbalize the importance of proper body mechanics for back care.   Time 2   Period Weeks   Status New   PT SHORT TERM GOAL #3   Title Patient to be independent in a stretching and a lumbar stabilization exercise program for improved core strength and improve lumbar motion to allow patient to sleep on her right side without increased pain.    Time 2   Period Weeks   Status New           PT Long Term Goals - 04/26/15 1616    PT LONG TERM GOAL #1   Title Pt core to be increased one grade and her right lower extremity to be 5/5 to allow patient to complete work duties without having any increased low back pain.    Time 4   Period Weeks   Status New   PT LONG TERM GOAL #2   Title Pt to verbalize that she is able to sit in a car for two hours and get out without any increased low back pain    Time 4   Period Weeks   Status New   PT LONG TERM GOAL #3   Title Patient to be able to bend or stoop down to pick an item off the floor with ease    Time 4   Period Weeks   Status New   PT LONG TERM GOAL #4   Title Patient to be able to complete three hours of heavy housework such as sweeping, vacuuming, mopping without having any back pain.    Time 4   Period Weeks   Status New               Plan - 05/08/15 1712    Clinical Impression Statement Pt with initial c/o pain lower back Rt sided, checked SI alignment with the following findings: Rt SI anterior with Rt LE LLD.  Muscle energy technique complete to improve SI alignment followed by core strengthening exercises.  Added UE press down during SLR to improve transverse abdominis activation.  Progressed core stabilizaiton with quadruped activities with min cueing to improve core activation and instructed proper lifting.  Pt able to verbalize and demonstrate appropriate form with proper lifting with min verbal cueing to improve weight distribution  with squats.  End of session pt reports pain free.  No therabands instock today, please give her postural strengthening handout and green tband when back in  stock.     Rehab Potential Good   PT Frequency 2x / week   PT Duration 4 weeks   PT Treatment/Interventions ADLs/Self Care Home Management;Ultrasound;Patient/family education;Manual techniques;Functional mobility training;Therapeutic activities;Therapeutic exercise;Cryotherapy   PT Next Visit Plan F/U with MET, continue PRN with pain Rt lower back. Continue with quadruped exercises and standing stabilizatin exercises with proper lifting.  Progress to yoga poses when ready.  Next session begin opp arm/leg in quadruped.   PT Home Exercise Plan give theraband when in stock (currently without)      Patient will benefit from skilled therapeutic intervention in order to improve the following deficits and impairments:  Decreased activity tolerance, Decreased range of motion, Decreased strength, Difficulty walking, Pain, Postural dysfunction  Visit Diagnosis: Lumbago with sciatica, right side  Muscle weakness (generalized)  Abnormal posture     Problem List Patient Active Problem List   Diagnosis Date Noted  . C. difficile enteritis 02/01/2012  . Hyponatremia 01/07/2011  . Hypokalemia 01/07/2011  . Elevated LFTs 01/07/2011  . Hypomagnesemia 01/07/2011  . Diabetes mellitus type 2 in obese ([place]) 01/06/2011  . Morbid obesity ([place]) 01/06/2011  . Polycystic ovary disease 01/06/2011  . Tobacco abuse 01/06/2011  . FATTY LIVER DISEASE 06/08/2008  . TRANSAMINASES, SERUM, ELEVATED 06/08/2008   Ihor Austin, LPTA; [place]  Aldona Lento 05/08/2015, 5:29 PM  [place], [place], [address] Phone: [phone]   Fax:  [phone]  Name: Cheryl Black MRN: [id number] Date of Birth: 09/25/1985

## 2015-05-10 ENCOUNTER — Encounter (HOSPITAL_COMMUNITY): Payer: 59

## 2015-05-15 ENCOUNTER — Encounter (HOSPITAL_COMMUNITY): Payer: 59

## 2015-05-16 ENCOUNTER — Telehealth (HOSPITAL_COMMUNITY): Payer: Self-pay

## 2015-05-16 NOTE — Telephone Encounter (Signed)
Called and left message for earlier opening with apt tomorrow.  Cheryl Birkeland [address], LPTA; CBIS [phone]

## 2015-05-17 ENCOUNTER — Telehealth (HOSPITAL_COMMUNITY): Payer: Self-pay

## 2015-05-17 ENCOUNTER — Ambulatory Visit (HOSPITAL_COMMUNITY): Payer: 59

## 2015-05-17 DIAGNOSIS — R293 Abnormal posture: Secondary | ICD-10-CM | POA: Diagnosis not present

## 2015-05-17 DIAGNOSIS — M6281 Muscle weakness (generalized): Secondary | ICD-10-CM

## 2015-05-17 DIAGNOSIS — M5441 Lumbago with sciatica, right side: Secondary | ICD-10-CM | POA: Diagnosis not present

## 2015-05-17 NOTE — Therapy (Signed)
[place], [place], [address] Phone: [phone]   Fax:  [phone]  Physical Therapy Treatment  Patient Details  Name: Cheryl Black MRN: [id number] Date of Birth: 08/15/1985 Referring Provider: Sherley Bounds   Encounter Date: 05/17/2015      PT End of Session - 05/17/15 1750    Visit Number 6   Number of Visits 8   Date for PT Re-Evaluation 05/26/15   Authorization Type UMR   Authorization - Visit Number 6   Authorization - Number of Visits 8   PT Start Time 5053   PT Stop Time 1808   PT Time Calculation (min) 38 min   Activity Tolerance Patient tolerated treatment well   Behavior During Therapy [facility] for tasks assessed/performed      Past Medical History  Diagnosis Date  . Fibrocystic breast disease   . Acid reflux   . Gout   . Polycystic ovary syndrome   . Fatty liver   . Diabetes mellitus   . PONV (postoperative nausea and vomiting)   . History of kidney stones     Past Surgical History  Procedure Laterality Date  . Cholecystectomy  01/10/2011    Procedure: LAPAROSCOPIC CHOLECYSTECTOMY;  Surgeon: Jamesetta So;  [facility];  Service: General;  Laterality: N/A;  . Liver biopsy  01/10/2011    Procedure: LIVER BIOPSY;  Surgeon: Jamesetta So;  Location: [facility];  Service: General;;  . Hydradenitis excision Right 06/18/2012    Procedure: EXCISION HYDRIADENITIS SUPRATIVA  RIGHT AXILLA;  Surgeon: Scherry Ran, MD;  Location: [facility];  Service: General;  Laterality: Right;  . Skin split graft Right 06/18/2012    Procedure: SKIN GRAFT SPLIT THICKNESS RIGHT AXILLA(WITH TWO STANDARD SKIN BOARDS 3"x 8" )  DONOR SITE RIGHT AND LEFT THIGHS;  Surgeon: Scherry Ran, MD;  Location: [facility];  Service: General;  Laterality: Right;    There were no vitals filed for this visit.      Subjective Assessment - 05/17/15 1729    Subjective Pt stated she has been pain free since last session, has been sore  though.  Currently pain free with reports of compliance with HEP.   Pertinent History DM   Patient Stated Goals to have no pain    Currently in Pain? No/denies           [facility] Adult PT Treatment/Exercise - 05/17/15 0001    Balance Poses: Yoga   Warrior I 2 reps;30 seconds   Warrior II 2 reps;30 seconds   Tree Pose 1 rep;15 seconds   Lumbar Exercises: Standing   Lifting From floor;10 reps   Lifting Weights (lbs) 10  10lb in 8lb box   Scapular Retraction Both;15 reps   Theraband Level (Scapular Retraction) Level 3 (Green)   Row 15 reps   Theraband Level (Row) Level 3 (Green)   Shoulder Extension 15 reps   Theraband Level (Shoulder Extension) Level 3 (Green)   Lumbar Exercises: Supine   Ab Set 10 reps   AB Set Limitations core bracing with UE pressdown   Bridge 10 reps   Bridge Limitations single leg bridge with knee extension   Straight Leg Raise 10 reps   Straight Leg Raises Limitations core bracing with UE press down with theraband following MET   Lumbar Exercises: Quadruped   Single Arm Raise 10 reps;5 seconds   Straight Leg Raise 5 reps;5 seconds   Opposite Arm/Leg Raise Right arm/Left leg;Left  arm/Right leg;5 reps;5 seconds            PT Short Term Goals - 04/26/15 1613    PT SHORT TERM GOAL #1   Title Pt to verbalize the importance of proper posture for back care.   Time 2   Period Weeks   Status New   PT SHORT TERM GOAL #2   Title Pt to verbalize the importance of proper body mechanics for back care.   Time 2   Period Weeks   Status New   PT SHORT TERM GOAL #3   Title Patient to be independent in a stretching and a lumbar stabilization exercise program for improved core strength and improve lumbar motion to allow patient to sleep on her right side without increased pain.    Time 2   Period Weeks   Status New           PT Long Term Goals - 04/26/15 1616    PT LONG TERM GOAL #1   Title Pt core to be increased one grade and her right lower extremity  to be 5/5 to allow patient to complete work duties without having any increased low back pain.    Time 4   Period Weeks   Status New   PT LONG TERM GOAL #2   Title Pt to verbalize that she is able to sit in a car for two hours and get out without any increased low back pain    Time 4   Period Weeks   Status New   PT LONG TERM GOAL #3   Title Patient to be able to bend or stoop down to pick an item off the floor with ease    Time 4   Period Weeks   Status New   PT LONG TERM GOAL #4   Title Patient to be able to complete three hours of heavy housework such as sweeping, vacuuming, mopping without having any back pain.    Time 4   Period Weeks   Status New               Plan - 05/17/15 1810    Clinical Impression Statement Pt progressing well towards goals with ability to verbalize and demonstrate (with min cueing) proper posture as well as proper lifting mechanics. Progressed demand with majority of exercises this session with single leg bridge with cueing to keep pelvis neutral for core stabilty, began opposite arm/leg in quadruped exercise, added 10# with proper lifting technqiues and progressed to yoga poses this session.  Pt able to tolerate well towards increased demand with no reports of pain through session.  Pt able to demonstrate appropriate form with posture strengtheing therabands, pt given theraband and printout to add to HEP.     Rehab Potential Good   PT Frequency 2x / week   PT Duration 4 weeks   PT Treatment/Interventions ADLs/Self Care Home Management;Ultrasound;Patient/family education;Manual techniques;Functional mobility training;Therapeutic activities;Therapeutic exercise;Cryotherapy   PT Next Visit Plan Continue with current PT POC.  Continue with quadruped opp arm/leg, standing core stabilization exercises, proper lifting and yoga poses.     PT Home Exercise Plan given green theraband and printout      Patient will benefit from skilled therapeutic  intervention in order to improve the following deficits and impairments:  Decreased activity tolerance, Decreased range of motion, Decreased strength, Difficulty walking, Pain, Postural dysfunction  Visit Diagnosis: Lumbago with sciatica, right side  Muscle weakness (generalized)  Abnormal posture  Problem List Patient Active Problem List   Diagnosis Date Noted  . C. difficile enteritis 02/01/2012  . Hyponatremia 01/07/2011  . Hypokalemia 01/07/2011  . Elevated LFTs 01/07/2011  . Hypomagnesemia 01/07/2011  . Diabetes mellitus type 2 in obese ([place]) 01/06/2011  . Morbid obesity ([place]) 01/06/2011  . Polycystic ovary disease 01/06/2011  . Tobacco abuse 01/06/2011  . FATTY LIVER DISEASE 06/08/2008  . TRANSAMINASES, SERUM, ELEVATED 06/08/2008   Ihor Austin, LPTA; CBIS [phone]  Aldona Lento 05/17/2015, 6:19 PM  [place], [place], [address] Phone: [phone]   Fax:  [phone]  Name: Cheryl Black MRN: [id number] Date of Birth: [birth year]

## 2015-05-18 ENCOUNTER — Ambulatory Visit (HOSPITAL_COMMUNITY): Payer: 59 | Admitting: Physical Therapy

## 2015-05-18 DIAGNOSIS — M5441 Lumbago with sciatica, right side: Secondary | ICD-10-CM

## 2015-05-18 DIAGNOSIS — M6281 Muscle weakness (generalized): Secondary | ICD-10-CM | POA: Diagnosis not present

## 2015-05-18 DIAGNOSIS — R293 Abnormal posture: Secondary | ICD-10-CM

## 2015-05-18 NOTE — Therapy (Signed)
[place], [place], [address] Phone: [phone]   Fax:  [phone]  Physical Therapy Treatment  Patient Details  Name: Cheryl Black MRN: [id number] Date of Birth: September 07, 1985 Referring Provider: Sherley Bounds   Encounter Date: 05/18/2015      PT End of Session - 05/18/15 1139    Visit Number 7   Number of Visits 7   Date for PT Re-Evaluation 05/26/15   Authorization Type UMR   Authorization - Visit Number 7   Authorization - Number of Visits 7   PT Start Time 1125   PT Stop Time 1140   PT Time Calculation (min) 15 min   Activity Tolerance Patient tolerated treatment well   Behavior During Therapy [facility] for tasks assessed/performed      Past Medical History  Diagnosis Date  . Fibrocystic breast disease   . Acid reflux   . Gout   . Polycystic ovary syndrome   . Fatty liver   . Diabetes mellitus   . PONV (postoperative nausea and vomiting)   . History of kidney stones     Past Surgical History  Procedure Laterality Date  . Cholecystectomy  01/10/2011    Procedure: LAPAROSCOPIC CHOLECYSTECTOMY;  Surgeon: Jamesetta So;  [facility]: [facility];  Service: General;  Laterality: N/A;  . Liver biopsy  01/10/2011    Procedure: LIVER BIOPSY;  Surgeon: Jamesetta So;  Location: [facility];  Service: General;;  . Hydradenitis excision Right 06/18/2012    Procedure: EXCISION HYDRIADENITIS SUPRATIVA  RIGHT AXILLA;  Surgeon: Scherry Ran, MD;  Location: [facility];  Service: General;  Laterality: Right;  . Skin split graft Right 06/18/2012    Procedure: SKIN GRAFT SPLIT THICKNESS RIGHT AXILLA(WITH TWO STANDARD SKIN BOARDS 3"x 8" )  DONOR SITE RIGHT AND LEFT THIGHS;  Surgeon: Scherry Ran, MD;  Location: [facility];  Service: General;  Laterality: Right;    There were no vitals filed for this visit.      Subjective Assessment - 05/18/15 1127    Subjective Pt states that she has been painfree for two weeks now.     How  long can you sit comfortably? in a comfortable she is able to sit for prolong period of time    How long can you stand comfortably? Pt states that she could stand for an hour now was 30 minutes.    How long can you walk comfortably? Pt is walking for an hour and a half now three days a week was 30 minutes.    Currently in Pain? No/denies            [facility] PT Assessment - 05/18/15 0001    Assessment   Medical Diagnosis lumbar pain    Referring Provider Sherley Bounds    Onset Date/Surgical Date 11/21/15   Prior Therapy none    Precautions   Precautions None   Restrictions   Weight Bearing Restrictions No   Prior Function   Vocation Full time employment   Vocation Requirements charge nurse; up and down    Leisure walking, read   Cognition   Overall Cognitive Status Within Functional Limits for tasks assessed   Observation/Other Assessments   Focus on Therapeutic Outcomes (FOTO)  61   Posture/Postural Control   Posture/Postural Control Postural limitations   Postural Limitations Rounded Shoulders;Forward head;Increased lumbar lordosis;Increased thoracic kyphosis   AROM   Lumbar Extension 40  was 22    Lumbar - Right  Side Bend 35  was 10   Lumbar - Left Side Bend 35  was 20    Strength   Right Hip Flexion 5/5  was 4+/5    Right Hip Extension 5/5  was 4/5    Right Hip ABduction 5/5  was 4/5    Left Hip Flexion 5/5   Left Hip Extension 5/5  was 5/5    Right Knee Flexion 5/5  was 4+.5    Right Knee Extension 5/5  was 4-/5   Left Knee Flexion 5/5   Left Knee Extension 5/5   Right Ankle Dorsiflexion --  5-/5 was 4+/5    Left Ankle Dorsiflexion 5/5                               PT Short Term Goals - 05/18/15 1141    PT SHORT TERM GOAL #1   Title Pt to verbalize the importance of proper posture for back care.   Time 2   Period Weeks   Status Achieved   PT SHORT TERM GOAL #2   Title Pt to verbalize the importance of proper body mechanics for  back care.   Time 2   Period Weeks   Status Achieved   PT SHORT TERM GOAL #3   Title Patient to be independent in a stretching and a lumbar stabilization exercise program for improved core strength and improve lumbar motion to allow patient to sleep on her right side without increased pain.    Time 2   Period Weeks   Status Achieved           PT Long Term Goals - 05/18/15 1142    PT LONG TERM GOAL #1   Title Pt core to be increased one grade and her right lower extremity to be 5/5 to allow patient to complete work duties without having any increased low back pain.    Time 4   Period Weeks   Status Achieved   PT LONG TERM GOAL #2   Title Pt to verbalize that she is able to sit in a car for two hours and get out without any increased low back pain    Time 4   Period Weeks   Status Achieved   PT LONG TERM GOAL #3   Title Patient to be able to bend or stoop down to pick an item off the floor with ease    Time 4   Period Weeks   Status Achieved   PT LONG TERM GOAL #4   Title Patient to be able to complete three hours of heavy housework such as sweeping, vacuuming, mopping without having any back pain.    Time 4   Period Weeks   Status Achieved               Plan - 05/18/15 1140    Clinical Impression Statement All goals have been met.  Pt states that she has been pain free for two weeks now.  She has  no difficulty with work or household activities at this time.  Pt has no questions on HEP.  Pt strength and ROM have retruned to normal.  Discharge pt    Rehab Potential Good   PT Next Visit Plan Discharge from therapy       Patient will benefit from skilled therapeutic intervention in order to improve the following deficits and impairments:  Decreased activity tolerance, Decreased range of motion, Decreased  strength, Difficulty walking, Pain, Postural dysfunction  Visit Diagnosis: Lumbago with sciatica, right side  Muscle weakness (generalized)  Abnormal  posture     Problem List Patient Active Problem List   Diagnosis Date Noted  . C. difficile enteritis 02/01/2012  . Hyponatremia 01/07/2011  . Hypokalemia 01/07/2011  . Elevated LFTs 01/07/2011  . Hypomagnesemia 01/07/2011  . Diabetes mellitus type 2 in obese ([place]) 01/06/2011  . Morbid obesity ([place]) 01/06/2011  . Polycystic ovary disease 01/06/2011  . Tobacco abuse 01/06/2011  . FATTY LIVER DISEASE 06/08/2008  . TRANSAMINASES, SERUM, ELEVATED 06/08/2008   Rayetta Humphrey, PT CLT [phone] 05/18/2015, 11:43 AM  [place], [place], [address] Phone: [phone]   Fax:  [phone]  Name: Cheryl Black MRN: [id number] Date of Birth: 10-09-1985    PHYSICAL THERAPY DISCHARGE SUMMARY  Visits from Start of Care: 7  Current functional level related to goals / functional outcomes: Independent all met   Remaining deficits: none   Education / Equipment: HEP  Plan: Patient agrees to discharge.  Patient goals were met. Patient is being discharged due to meeting the stated rehab goals.  ?????     Rayetta Humphrey, [place] CLT [phone]

## 2015-05-22 ENCOUNTER — Ambulatory Visit (HOSPITAL_COMMUNITY): Payer: 59

## 2015-05-24 ENCOUNTER — Encounter (HOSPITAL_COMMUNITY): Payer: 59

## 2015-05-29 ENCOUNTER — Encounter (HOSPITAL_COMMUNITY): Payer: 59

## 2015-05-31 ENCOUNTER — Encounter (HOSPITAL_COMMUNITY): Payer: 59 | Admitting: Physical Therapy

## 2015-06-05 ENCOUNTER — Encounter (HOSPITAL_COMMUNITY): Payer: 59

## 2015-06-12 ENCOUNTER — Other Ambulatory Visit (HOSPITAL_COMMUNITY): Payer: Self-pay | Admitting: Emergency Medicine

## 2015-06-12 ENCOUNTER — Ambulatory Visit (HOSPITAL_COMMUNITY)
Admission: RE | Admit: 2015-06-12 | Discharge: 2015-06-12 | Disposition: A | Discharge status: Home / Self Care | Payer: 59 | Source: Ambulatory Visit | Attending: Emergency Medicine | Admitting: Emergency Medicine

## 2015-06-12 DIAGNOSIS — M79672 Pain in left foot: Secondary | ICD-10-CM | POA: Diagnosis not present

## 2015-07-04 MED FILL — PIOGLITAZONE HCL 15 MG TAB: 15 | 30 days supply | Qty: 30 | Fill #5

## 2015-07-04 MED FILL — ALLOPURINOL 300 MG TABLET: 300 | 30 days supply | Qty: 30 | Fill #5

## 2015-07-04 MED FILL — PRAVASTATIN NA 40 MG TAB: 40 | 30 days supply | Qty: 30 | Fill #5

## 2015-07-04 MED FILL — KOMBIGLYZE XR 2.5-1,000 MG: 2.5-1000 | 30 days supply | Qty: 30 | Fill #2

## 2015-07-06 MED FILL — PANTOPRAZOLE SOD DR 40 MG T: 40 | 90 days supply | Qty: 180 | Fill #0

## 2015-08-13 MED FILL — KOMBIGLYZE XR 2.5-1,000 MG: 2.5-1000 | 30 days supply | Qty: 30 | Fill #3

## 2015-08-13 MED FILL — ALLOPURINOL 300 MG TABLET: 300 | 30 days supply | Qty: 30 | Fill #0

## 2015-08-13 MED FILL — PIOGLITAZONE HCL 15 MG TAB: 15 | 30 days supply | Qty: 30 | Fill #0

## 2015-08-13 MED FILL — PRAVASTATIN NA 40 MG TAB: 40 | 30 days supply | Qty: 30 | Fill #0

## 2015-08-27 DIAGNOSIS — E119 Type 2 diabetes mellitus without complications: Secondary | ICD-10-CM | POA: Diagnosis not present

## 2015-09-07 DIAGNOSIS — M109 Gout, unspecified: Secondary | ICD-10-CM | POA: Diagnosis not present

## 2015-09-07 DIAGNOSIS — E119 Type 2 diabetes mellitus without complications: Secondary | ICD-10-CM | POA: Diagnosis not present

## 2015-09-07 DIAGNOSIS — R51 Headache: Secondary | ICD-10-CM | POA: Diagnosis not present

## 2015-09-07 DIAGNOSIS — E782 Mixed hyperlipidemia: Secondary | ICD-10-CM | POA: Diagnosis not present

## 2015-09-07 DIAGNOSIS — R945 Abnormal results of liver function studies: Secondary | ICD-10-CM | POA: Diagnosis not present

## 2015-09-07 DIAGNOSIS — M545 Low back pain: Secondary | ICD-10-CM | POA: Diagnosis not present

## 2015-09-07 DIAGNOSIS — I10 Essential (primary) hypertension: Secondary | ICD-10-CM | POA: Diagnosis not present

## 2015-09-07 MED FILL — ONDANSETRON HCL 4 MG TABLET: 4 | 5 days supply | Qty: 20 | Fill #0

## 2015-09-07 MED FILL — PROMETHAZINE 25 MG TABLET: 25 | 5 days supply | Qty: 20 | Fill #0

## 2015-09-07 MED FILL — RIZATRIPTAN 10 MG TABLET: 10 | 30 days supply | Qty: 9 | Fill #0

## 2015-09-17 MED FILL — ALLOPURINOL 300 MG TABLET: 300 | 30 days supply | Qty: 30 | Fill #1

## 2015-09-17 MED FILL — PRAVASTATIN NA 40 MG TAB: 40 | 30 days supply | Qty: 30 | Fill #1

## 2015-09-17 MED FILL — KOMBIGLYZE XR 2.5-1,000 MG: 2.5-1000 | 30 days supply | Qty: 30 | Fill #4

## 2015-10-26 MED FILL — ALLOPURINOL 300 MG TABLET: 300 | 30 days supply | Qty: 30 | Fill #2

## 2015-10-26 MED FILL — PANTOPRAZOLE SOD DR 40 MG T: 40 | 90 days supply | Qty: 180 | Fill #1

## 2015-10-26 MED FILL — PRAVASTATIN NA 40 MG TAB: 40 | 30 days supply | Qty: 30 | Fill #2

## 2015-11-13 MED FILL — KOMBIGLYZE XR 2.5-1,000 MG: 2.5-1000 | 30 days supply | Qty: 30 | Fill #0

## 2016-01-09 MED FILL — PRAVASTATIN NA 40 MG TAB: 40 | 30 days supply | Qty: 30 | Fill #3

## 2016-01-09 MED FILL — ALLOPURINOL 300 MG TABLET: 300 | 30 days supply | Qty: 30 | Fill #3

## 2016-01-09 MED FILL — KOMBIGLYZE XR 2.5-1,000 MG: 2.5-1000 | 30 days supply | Qty: 30 | Fill #1

## 2016-02-11 DIAGNOSIS — E782 Mixed hyperlipidemia: Secondary | ICD-10-CM | POA: Diagnosis not present

## 2016-02-11 DIAGNOSIS — E119 Type 2 diabetes mellitus without complications: Secondary | ICD-10-CM | POA: Diagnosis not present

## 2016-02-13 MED FILL — ALLOPURINOL 300 MG TABLET: 300 | 30 days supply | Qty: 30 | Fill #4

## 2016-02-13 MED FILL — KOMBIGLYZE XR 2.5-1,000 MG: 2.5-1000 | 30 days supply | Qty: 30 | Fill #2

## 2016-02-13 MED FILL — PANTOPRAZOLE SOD DR 40 MG T: 40 | 90 days supply | Qty: 180 | Fill #2

## 2016-02-13 MED FILL — PRAVASTATIN NA 40 MG TAB: 40 | 30 days supply | Qty: 30 | Fill #4

## 2016-02-14 DIAGNOSIS — Z6841 Body Mass Index (BMI) 40.0 and over, adult: Secondary | ICD-10-CM | POA: Diagnosis not present

## 2016-02-14 DIAGNOSIS — J111 Influenza due to unidentified influenza virus with other respiratory manifestations: Secondary | ICD-10-CM | POA: Diagnosis not present

## 2016-02-18 DIAGNOSIS — I10 Essential (primary) hypertension: Secondary | ICD-10-CM | POA: Diagnosis not present

## 2016-02-18 DIAGNOSIS — R51 Headache: Secondary | ICD-10-CM | POA: Diagnosis not present

## 2016-02-18 DIAGNOSIS — E782 Mixed hyperlipidemia: Secondary | ICD-10-CM | POA: Diagnosis not present

## 2016-02-18 DIAGNOSIS — R945 Abnormal results of liver function studies: Secondary | ICD-10-CM | POA: Diagnosis not present

## 2016-02-18 DIAGNOSIS — E119 Type 2 diabetes mellitus without complications: Secondary | ICD-10-CM | POA: Diagnosis not present

## 2016-02-18 DIAGNOSIS — M109 Gout, unspecified: Secondary | ICD-10-CM | POA: Diagnosis not present

## 2016-02-18 DIAGNOSIS — J019 Acute sinusitis, unspecified: Secondary | ICD-10-CM | POA: Diagnosis not present

## 2016-03-03 ENCOUNTER — Encounter: Payer: Self-pay | Admitting: Adult Health

## 2016-03-03 ENCOUNTER — Ambulatory Visit (INDEPENDENT_AMBULATORY_CARE_PROVIDER_SITE_OTHER): Payer: 59 | Admitting: Adult Health

## 2016-03-03 ENCOUNTER — Other Ambulatory Visit (HOSPITAL_COMMUNITY)
Admission: RE | Admit: 2016-03-03 | Discharge: 2016-03-03 | Disposition: A | Discharge status: Home / Self Care | Payer: 59 | Source: Ambulatory Visit | Attending: Adult Health | Admitting: Adult Health

## 2016-03-03 VITALS — BP 110/74 | HR 82 | Ht 67.25 in | Wt 303.0 lb

## 2016-03-03 DIAGNOSIS — Z01419 Encounter for gynecological examination (general) (routine) without abnormal findings: Secondary | ICD-10-CM

## 2016-03-03 DIAGNOSIS — Z1151 Encounter for screening for human papillomavirus (HPV): Secondary | ICD-10-CM | POA: Insufficient documentation

## 2016-03-03 NOTE — Patient Instructions (Signed)
Physical in 1 year, pap in 3 if normal Decrease cigarettes Eat clean and stay active Labs with PCP, get HIV then

## 2016-03-03 NOTE — Progress Notes (Signed)
Patient ID: Cheryl Black, female   DOB: September 10, 1985, 31 y.o.   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  PCP is Dr Nevada Crane.   Current Medications, Allergies, Past Medical History, Past Surgical History, Family History and Social History were reviewed in [employer] record.     Review of Systems:  Patient denies any headaches, hearing loss, fatigue, blurred vision, shortness of breath, chest pain, abdominal pain, problems with bowel movements, urination, or intercourse. No joint pain or mood swings.Periods are regular, and she is not on birth control and declines.   Physical Exam:BP 110/74 (BP Location: Left Arm, Patient Position: Sitting, Cuff Size: Large)   Pulse 82   Ht 5' 7.25" (1.708 m)   Wt (!) 303 lb (137.4 kg)   LMP 02/13/2016 (Approximate)   BMI 47.10 kg/m  General:  Well developed, well nourished, no acute distress Skin:  Warm and dry,has hidradenitis,inner thighs and under arms(has had surgery here) Neck:  Midline trachea, normal thyroid, good ROM, no lymphadenopathy Lungs; Clear to auscultation bilaterally Breast:  No dominant palpable mass, retraction, or nipple discharge Cardiovascular: Regular rate and rhythm Abdomen:  Soft, non tender, no hepatosplenomegaly Pelvic:  External genitalia is normal in appearance, no lesions.  The vagina is normal in appearance. Urethra has no lesions or masses. The cervix is not entirely visualized, due to boy habitus, pap with HPV performed.  Uterus is felt to be normal size, shape, and contour.  No adnexal masses or tenderness noted.Bladder is non tender, no masses felt. Extremities/musculoskeletal:  No swelling or varicosities noted, no clubbing or cyanosis Psych:  No mood changes, alert and cooperative,seems happy PHQ 2 score 0.  Impression: 1. Encounter for gynecological examination with  Papanicolaou smear of cervix       Plan: Physical in 1 year, pap in 3 if normal Decrease cigarettes Eat clean and stay active Labs with PCP, get HIV then

## 2016-03-04 LAB — CYTOLOGY - PAP
Adequacy: ABSENT
DIAGNOSIS: NEGATIVE
HPV (WINDOPATH): NOT DETECTED

## 2016-03-07 ENCOUNTER — Ambulatory Visit (HOSPITAL_COMMUNITY)
Admission: RE | Admit: 2016-03-07 | Discharge: 2016-03-07 | Disposition: A | Discharge status: Home / Self Care | Payer: 59 | Source: Ambulatory Visit | Attending: Internal Medicine | Admitting: Internal Medicine

## 2016-03-07 ENCOUNTER — Other Ambulatory Visit: Payer: Self-pay | Admitting: Internal Medicine

## 2016-03-07 DIAGNOSIS — R059 Cough, unspecified: Secondary | ICD-10-CM

## 2016-03-07 DIAGNOSIS — R918 Other nonspecific abnormal finding of lung field: Secondary | ICD-10-CM | POA: Insufficient documentation

## 2016-03-07 DIAGNOSIS — R05 Cough: Secondary | ICD-10-CM | POA: Insufficient documentation

## 2016-03-09 ENCOUNTER — Emergency Department (HOSPITAL_COMMUNITY)
Admission: EM | Admit: 2016-03-09 | Discharge: 2016-03-09 | Disposition: A | Discharge status: Home / Self Care | Payer: 59 | Attending: Emergency Medicine | Admitting: Emergency Medicine

## 2016-03-09 ENCOUNTER — Encounter (HOSPITAL_COMMUNITY): Payer: Self-pay | Admitting: *Deleted

## 2016-03-09 ENCOUNTER — Emergency Department (HOSPITAL_COMMUNITY): Payer: 59

## 2016-03-09 DIAGNOSIS — Z79899 Other long term (current) drug therapy: Secondary | ICD-10-CM | POA: Diagnosis not present

## 2016-03-09 DIAGNOSIS — E119 Type 2 diabetes mellitus without complications: Secondary | ICD-10-CM | POA: Diagnosis not present

## 2016-03-09 DIAGNOSIS — F17210 Nicotine dependence, cigarettes, uncomplicated: Secondary | ICD-10-CM | POA: Insufficient documentation

## 2016-03-09 DIAGNOSIS — R05 Cough: Secondary | ICD-10-CM | POA: Diagnosis not present

## 2016-03-09 DIAGNOSIS — R0602 Shortness of breath: Secondary | ICD-10-CM | POA: Diagnosis not present

## 2016-03-09 DIAGNOSIS — J9801 Acute bronchospasm: Secondary | ICD-10-CM | POA: Diagnosis not present

## 2016-03-09 LAB — CBC WITH DIFFERENTIAL/PLATELET
Basophils Absolute: 0 10*3/uL (ref 0.0–0.1)
Basophils Relative: 0 %
Eosinophils Absolute: 0 10*3/uL (ref 0.0–0.7)
Eosinophils Relative: 0 %
HCT: 44.1 % (ref 36.0–46.0)
Hemoglobin: 15 g/dL (ref 12.0–15.0)
Lymphocytes Relative: 24 %
Lymphs Abs: 2.5 10*3/uL (ref 0.7–4.0)
MCH: 30.5 pg (ref 26.0–34.0)
MCHC: 34 g/dL (ref 30.0–36.0)
MCV: 89.6 fL (ref 78.0–100.0)
Monocytes Absolute: 1.3 10*3/uL — ABNORMAL HIGH (ref 0.1–1.0)
Monocytes Relative: 12 %
Neutro Abs: 6.4 10*3/uL (ref 1.7–7.7)
Neutrophils Relative %: 64 %
Platelets: 273 10*3/uL (ref 150–400)
RBC: 4.92 MIL/uL (ref 3.87–5.11)
RDW: 13.6 % (ref 11.5–15.5)
WBC: 10.2 10*3/uL (ref 4.0–10.5)

## 2016-03-09 LAB — COMPREHENSIVE METABOLIC PANEL
ALK PHOS: 78 U/L (ref 38–126)
ALT: 92 U/L — AB (ref 14–54)
ANION GAP: 9 (ref 5–15)
AST: 50 U/L — ABNORMAL HIGH (ref 15–41)
Albumin: 3.5 g/dL (ref 3.5–5.0)
BILIRUBIN TOTAL: 0.2 mg/dL — AB (ref 0.3–1.2)
BUN: 6 mg/dL (ref 6–20)
CALCIUM: 8.7 mg/dL — AB (ref 8.9–10.3)
CO2: 23 mmol/L (ref 22–32)
CREATININE: 0.61 mg/dL (ref 0.44–1.00)
Chloride: 105 mmol/L (ref 101–111)
GFR calc non Af Amer: >60 mL/min (ref >60)
GLUCOSE: 169 mg/dL — AB (ref 65–99)
Potassium: 3.2 mmol/L — ABNORMAL LOW (ref 3.5–5.1)
Sodium: 137 mmol/L (ref 135–145)
TOTAL PROTEIN: 6.9 g/dL (ref 6.5–8.1)

## 2016-03-09 LAB — I-STAT CG4 LACTIC ACID, ED: Lactic Acid, Venous: 1.91 mmol/L (ref 0.5–1.9)

## 2016-03-09 MED ORDER — ALBUTEROL SULFATE HFA 108 (90 BASE) MCG/ACT IN AERS
2.0000 | INHALATION_SPRAY | Freq: Once | RESPIRATORY_TRACT | Status: AC
  Administered 2016-03-09: 2 via RESPIRATORY_TRACT
  Filled 2016-03-09: qty 6.7

## 2016-03-09 MED ORDER — ALBUTEROL (5 MG/ML) CONTINUOUS INHALATION SOLN
10.0000 mg/h | INHALATION_SOLUTION | RESPIRATORY_TRACT | Status: AC
  Administered 2016-03-09: 10 mg/h via RESPIRATORY_TRACT
  Filled 2016-03-09: qty 20

## 2016-03-09 MED ORDER — BENZONATATE 100 MG PO CAPS: 100.0000 mg | ORAL_CAPSULE | Freq: Three times a day (TID) | ORAL | 0 refills | Status: DC

## 2016-03-09 MED ORDER — METHYLPREDNISOLONE SODIUM SUCC 125 MG IJ SOLR
125.0000 mg | Freq: Once | INTRAMUSCULAR | Status: AC
  Administered 2016-03-09: 125 mg via INTRAVENOUS
  Filled 2016-03-09: qty 2

## 2016-03-09 MED ORDER — BENZONATATE 100 MG PO CAPS
200.0000 mg | ORAL_CAPSULE | Freq: Once | ORAL | Status: AC
  Administered 2016-03-09: 200 mg via ORAL
  Filled 2016-03-09: qty 2

## 2016-03-09 MED ORDER — IPRATROPIUM-ALBUTEROL 0.5-2.5 (3) MG/3ML IN SOLN
3.0000 mL | Freq: Once | RESPIRATORY_TRACT | Status: AC
  Administered 2016-03-09: 3 mL via RESPIRATORY_TRACT
  Filled 2016-03-09: qty 3

## 2016-03-09 NOTE — ED Triage Notes (Signed)
Pt comes in with shortness of breath starting Thursday. She was seen at her doctors office on Friday and told she had pneumonia. Pt was then placed on moxifloxacin.

## 2016-03-09 NOTE — ED Provider Notes (Signed)
[place] DEPT Provider Note   CSN: [id number] Arrival date & time: 03/09/16  6010   By signing my name below, I, Charolotte Eke, attest that this documentation has been prepared under the direction and in the presence of Noemi Chapel, MD. Electronically Signed: Charolotte Eke, Scribe. 03/09/16. 11:39 AM.    History   Chief Complaint Chief Complaint  Patient presents with  . Shortness of Breath    HPI Cheryl Black is a 31 y.o. female with h/o diet controlled Dm, HLD who presents to the Emergency Department complaining of gradual onset, moderate, persistent shortness of breathing that began yesterday. Pt has associated cough that began 4 days ago. She has a worsening respiratory infection. Pt report that she had the flu on 02/20/16.  Augmentin started of 02/14/16 for sinusitis. Pt finished [place] 4-5 days ago and symptoms had be relieved. Pt is quit smoking on Thursday, but has been a chronic smoker for 10 years. Pt does not use breathing tx at home. Pt has taken cough medication today with limited relief. Pt striated prednisone, havilox and hydrocodone containing cough syrup yesterday. Pt denies leg swelling. Pt is allergic to Augmentin [amoxicillin-pot clavulanate]; Benicar [olmesartan]; Doxycycline; Influenza vaccine live; and Invokana [canagliflozin].  The history is provided by the patient. No language interpreter was used.    Past Medical History:  Diagnosis Date  . Acid reflux   . Clostridium difficile infection    in the past  . Diabetes mellitus   . Fatty liver   . Fibrocystic breast disease   . Gout   . Hidradenitis   . History of kidney stones   . Hyperlipemia   . Obesity   . Polycystic ovary syndrome   . PONV (postoperative nausea and vomiting)   . Smoker     Patient Active Problem List   Diagnosis Date Noted  . C. difficile enteritis 02/01/2012  . Hyponatremia 01/07/2011  . Hypokalemia 01/07/2011  . Elevated LFTs 01/07/2011  . Hypomagnesemia 01/07/2011  .  Diabetes mellitus type 2 in obese ([place]) 01/06/2011  . Morbid obesity ([place]) 01/06/2011  . Polycystic ovary disease 01/06/2011  . Tobacco abuse 01/06/2011  . FATTY LIVER DISEASE 06/08/2008  . TRANSAMINASES, SERUM, ELEVATED 06/08/2008    Past Surgical History:  Procedure Laterality Date  . CHOLECYSTECTOMY  01/10/2011   Procedure: LAPAROSCOPIC CHOLECYSTECTOMY;  Surgeon: Jamesetta So;  Location: [facility];  Service: General;  Laterality: N/A;  . HYDRADENITIS EXCISION Right 06/18/2012   Procedure: EXCISION HYDRIADENITIS SUPRATIVA  RIGHT AXILLA;  Surgeon: Scherry Ran, MD;  Location: [facility];  Service: General;  Laterality: Right;  . LIVER BIOPSY  01/10/2011   Procedure: LIVER BIOPSY;  Surgeon: Jamesetta So;  Location: [facility];  Service: General;;  . SKIN SPLIT GRAFT Right 06/18/2012   Procedure: SKIN GRAFT SPLIT THICKNESS RIGHT AXILLA(WITH TWO STANDARD SKIN BOARDS 3"x 8" )  DONOR SITE RIGHT AND LEFT THIGHS;  Surgeon: Scherry Ran, MD;  Location: [facility];  Service: General;  Laterality: Right;    OB History    Gravida Para Term Preterm AB Living   0 0 0 0 0 0   SAB TAB Ectopic Multiple Live Births   0 0 0 0 0       Home Medications    Prior to Admission medications   Medication Sig Start Date End Date Taking? Authorizing Provider  allopurinol (ZYLOPRIM) 300 MG tablet Take 300 mg by mouth daily.   Yes Historical Provider, MD  colchicine 0.6 MG  tablet Take 0.6 mg by mouth daily as needed. gout   Yes Historical Provider, MD  HYDROMET 5-1.5 MG/5ML syrup Take 5 mLs by mouth every 8 (eight) hours as needed for cough. 03/07/16  Yes Historical Provider, MD  KOMBIGLYZE XR 2.05-998 MG TB24 daily.  02/13/16  Yes Historical Provider, MD  moxifloxacin (AVELOX) 400 MG tablet Take 1 tablet by mouth daily. 03/07/16  Yes Historical Provider, MD  ondansetron (ZOFRAN) 4 MG tablet Take 4 mg by mouth as needed for nausea or vomiting.   Yes Historical Provider, MD  oxyCODONE (OXY IR/ROXICODONE) 5 MG  immediate release tablet Take 5 mg by mouth as needed for severe pain.   Yes Historical Provider, MD  pantoprazole (PROTONIX) 40 MG tablet Take 40 mg by mouth 2 (two) times daily.  02/13/16  Yes Historical Provider, MD  pravastatin (PRAVACHOL) 20 MG tablet Take 10 mg by mouth daily.    Yes Historical Provider, MD  predniSONE (STERAPRED UNI-PAK 21 TAB) 10 MG (21) TBPK tablet Take 1 tablet by mouth as directed. 03/07/16  Yes Historical Provider, MD  promethazine (PHENERGAN) 12.5 MG tablet Take 12.5 mg by mouth as needed for nausea or vomiting.   Yes Historical Provider, MD  rizatriptan (MAXALT) 5 MG tablet Take 5 mg by mouth as needed for migraine. May repeat in 2 hours if needed   Yes Historical Provider, MD  benzonatate (TESSALON) 100 MG capsule Take 1 capsule (100 mg total) by mouth every 8 (eight) hours. 03/09/16   Noemi Chapel, MD    Family History Family History  Problem Relation Age of Onset  . Stroke Other     paternal great grandfather  . Diabetes Father   . Hypertension Father   . Cancer Maternal Grandfather     unclear  . Heart attack Paternal Grandfather   . Hypertension Paternal Grandmother   . Diabetes Paternal Grandmother   . Gout Paternal Grandmother   . Hypertension Maternal Grandmother   . Other Mother     pre-cancerous cells; had hyst  . Gout Brother   . Cancer Sister     cervical; had hyst    Social History Social History  Substance Use Topics  . Smoking status: Current Every Day Smoker    Packs/day: 0.50    Years: 10.00    Types: Cigarettes  . Smokeless tobacco: Never Used  . Alcohol use Yes     Comment: twice a month     Allergies   Ace inhibitors; Augmentin [amoxicillin-pot clavulanate]; Benicar [olmesartan]; Doxycycline; Influenza vaccine live; and Invokana [canagliflozin]   Review of Systems Review of Systems  Musculoskeletal:       Denies leg swelling.  All other systems reviewed and are negative.    Physical Exam Updated Vital Signs BP  109/58   Pulse 98   Temp 97.9 F (36.6 C) (Oral)   Resp 26   Ht 5' 7"  (1.702 m)   Wt (!) 303 lb (137.4 kg)   LMP 02/13/2016 (Approximate)   SpO2 94%   BMI 47.46 kg/m   Physical Exam  Constitutional: She appears well-developed and well-nourished. No distress.  HENT:  Head: Normocephalic and atraumatic.  Mouth/Throat: Oropharynx is clear and moist. No oropharyngeal exudate.  Eyes: Conjunctivae and EOM are normal. Pupils are equal, round, and reactive to light. Right eye exhibits no discharge. Left eye exhibits no discharge. No scleral icterus.  Neck: Normal range of motion. Neck supple. No JVD present. No thyromegaly present.  Cardiovascular: Normal rate, regular rhythm, normal  heart sounds and intact distal pulses.  Exam reveals no gallop and no friction rub.   No murmur heard. Pulmonary/Chest: Effort normal and breath sounds normal. No respiratory distress. She has no wheezes. She has no rales.  Mild tachnychpean. Speaks in shortened sentences. Diffuse forced expiratory wheezing. No rales.   Abdominal: Soft. Bowel sounds are normal. She exhibits no distension and no mass. There is no tenderness.  Musculoskeletal: Normal range of motion. She exhibits no edema or tenderness.  Lymphadenopathy:    She has no cervical adenopathy.  Neurological: She is alert. Coordination normal.  Skin: Skin is warm and dry. No rash noted. No erythema.  Psychiatric: She has a normal mood and affect. Her behavior is normal.  Nursing note and vitals reviewed.    ED Treatments / Results   DIAGNOSTIC STUDIES: Oxygen Saturation is 96% on room air, adequate by my interpretation.    COORDINATION OF CARE: 8:20 AM Discussed treatment plan with pt at bedside and pt agreed to plan, which includes antibiotics, breathing tx, and continued steroids.  Labs (all labs ordered are listed, but only abnormal results are displayed) Labs Reviewed  CBC WITH DIFFERENTIAL/PLATELET - Abnormal; Notable for the following:        Result Value   Monocytes Absolute 1.3 (*)    All other components within normal limits  COMPREHENSIVE METABOLIC PANEL - Abnormal; Notable for the following:    Potassium 3.2 (*)    Glucose, Bld 169 (*)    Calcium 8.7 (*)    AST 50 (*)    ALT 92 (*)    Total Bilirubin 0.2 (*)    All other components within normal limits  I-STAT CG4 LACTIC ACID, ED - Abnormal; Notable for the following:    Lactic Acid, Venous 1.91 (*)    All other components within normal limits    Radiology Dg Chest 2 View  Result Date: 03/09/2016 CLINICAL DATA:  Cough and wheezing for 1 week.  Smoker. EXAM: CHEST  2 VIEW COMPARISON:  03/07/2016 FINDINGS: The cardiomediastinal silhouette is within normal limits. The lungs are less well inflated than on the prior study. There is persistent peribronchial thickening and interstitial accentuation. New mild linear opacity in the left lung base is consistent with atelectasis. No pleural effusion or pneumothorax is seen. No acute osseous abnormality is seen. IMPRESSION: Airway thickening suggesting bronchitis with left basilar subsegmental atelectasis. Electronically Signed   By: Logan Bores M.D.   On: 03/09/2016 09:14    Procedures Procedures (including critical care time)  Medications Ordered in ED Medications  albuterol (PROVENTIL,VENTOLIN) solution continuous neb (10 mg/hr Nebulization New Bag/Given 03/09/16 0951)  albuterol (PROVENTIL HFA;VENTOLIN HFA) 108 (90 Base) MCG/ACT inhaler 2 puff (not administered)  ipratropium-albuterol (DUONEB) 0.5-2.5 (3) MG/3ML nebulizer solution 3 mL (3 mLs Nebulization Given 03/09/16 0838)  methylPREDNISolone sodium succinate (SOLU-MEDROL) 125 mg/2 mL injection 125 mg (125 mg Intravenous Given 03/09/16 0848)  benzonatate (TESSALON) capsule 200 mg (200 mg Oral Given 03/09/16 0906)     Initial Impression / Assessment and Plan / ED Course  I have reviewed the triage vital signs and the nursing notes.  Pertinent labs & imaging results  that were available during my care of the patient were reviewed by me and considered in my medical decision making (see chart for details).     Chest XR was discussed with patient at bedside and no focal infiltrates noted.  New CXR without infiltrates Labs unremarkable with CBC normal, slighty low K Normal renal function LFT's  just outside of normal   The labs are likely reactive and not causatory, will give nebs, steroids, and recheck Ambulated > 92% on RA.  After nebs, pt has ongoing wheezing, continuous albuterol with complete resolution of wheezing and SOB   Stable for d/c - pt in agreement - well appearing on d/c. Likely bronchitis Will take residual 4 days of prednisone pack, Tessalon and Albuterol MDI - will finish Avelox as well.  Final Clinical Impressions(s) / ED Diagnoses   Final diagnoses:  Acute bronchospasm    New Prescriptions New Prescriptions   BENZONATATE (TESSALON) 100 MG CAPSULE    Take 1 capsule (100 mg total) by mouth every 8 (eight) hours.     Noemi Chapel, MD 03/09/16 [phone]

## 2016-03-09 NOTE — Discharge Instructions (Signed)
Your xray showed no signs of pneumonia -   Please complete the course of Prednisone Albuterol inhaler - 2 puffs every 4 hours as needed for wheezing or coughing or shortness of breath Tessalon as neeeded every 8 hours for coughing  Return to the ER if your symptoms worsen.  Please obtain all of your results from medical records or have your doctors office obtain the results - share them with your doctor - you should be seen at your doctors office in the next 2 days. Call today to arrange your follow up. Take the medications as prescribed. Please review all of the medicines and only take them if you do not have an allergy to them. Please be aware that if you are taking birth control pills, taking other prescriptions, ESPECIALLY ANTIBIOTICS may make the birth control ineffective - if this is the case, either do not engage in sexual activity or use alternative methods of birth control such as condoms until you have finished the medicine and your family doctor says it is OK to restart them. If you are on a blood thinner such as COUMADIN, be aware that any other medicine that you take may cause the coumadin to either work too much, or not enough - you should have your coumadin level rechecked in next 7 days if this is the case.  ?  It is also a possibility that you have an allergic reaction to any of the medicines that you have been prescribed - Everybody reacts differently to medications and while MOST people have no trouble with most medicines, you may have a reaction such as nausea, vomiting, rash, swelling, shortness of breath. If this is the case, please stop taking the medicine immediately and contact your physician.  ?  You should return to the ER if you develop severe or worsening symptoms.     STOP SMOKING

## 2016-03-09 NOTE — ED Notes (Signed)
RT at bedside.

## 2016-03-09 NOTE — ED Notes (Signed)
Pt ambulated in hallway around nurses station Pulse 104 o2 97 while at rest and dropped to 92 while walking

## 2016-03-15 DIAGNOSIS — J189 Pneumonia, unspecified organism: Secondary | ICD-10-CM | POA: Diagnosis not present

## 2016-03-15 DIAGNOSIS — J208 Acute bronchitis due to other specified organisms: Secondary | ICD-10-CM | POA: Diagnosis not present

## 2016-03-15 DIAGNOSIS — J40 Bronchitis, not specified as acute or chronic: Secondary | ICD-10-CM | POA: Diagnosis not present

## 2016-03-17 MED FILL — PRAVASTATIN NA 40 MG TAB: 40 | 30 days supply | Qty: 30 | Fill #5

## 2016-03-17 MED FILL — KOMBIGLYZE XR 2.5-1,000 MG: 2.5-1000 | 30 days supply | Qty: 30 | Fill #3

## 2016-03-17 MED FILL — ALLOPURINOL 300 MG TABLET: 300 | 30 days supply | Qty: 30 | Fill #5

## 2016-04-28 MED FILL — KOMBIGLYZE XR 2.5-1,000 MG: 2.5-1000 | 30 days supply | Qty: 30 | Fill #4

## 2016-04-28 MED FILL — PRAVASTATIN NA 40 MG TAB: 40 | 30 days supply | Qty: 30 | Fill #0

## 2016-04-28 MED FILL — ALLOPURINOL 300 MG TABLET: 300 | 30 days supply | Qty: 30 | Fill #0

## 2016-06-03 MED FILL — KOMBIGLYZE XR 2.5-1,000 MG: 2.5-1000 | 30 days supply | Qty: 30 | Fill #5

## 2016-06-03 MED FILL — PANTOPRAZOLE SOD DR 40 MG T: 40 | 90 days supply | Qty: 180 | Fill #3

## 2016-06-03 MED FILL — ALLOPURINOL 300 MG TABLET: 300 | 30 days supply | Qty: 30 | Fill #1

## 2016-07-04 MED FILL — RIZATRIPTAN 10 MG TABLET: 10 | 30 days supply | Qty: 9 | Fill #1

## 2016-07-04 MED FILL — KOMBIGLYZE XR 2.5-1,000 MG: 2.5-1000 | 30 days supply | Qty: 30 | Fill #6

## 2016-07-04 MED FILL — ALLOPURINOL 300 MG TABLET: 300 | 30 days supply | Qty: 30 | Fill #2

## 2016-07-04 MED FILL — PROMETHAZINE 25 MG TABLET: 25 | 5 days supply | Qty: 20 | Fill #1

## 2016-07-04 MED FILL — ONDANSETRON HCL 4 MG TABLET: 4 | 5 days supply | Qty: 20 | Fill #1

## 2016-07-04 MED FILL — PRAVASTATIN NA 40 MG TAB: 40 | 30 days supply | Qty: 30 | Fill #1

## 2016-08-25 MED FILL — PRAVASTATIN NA 40 MG TAB: 40 | 30 days supply | Qty: 30 | Fill #2

## 2016-08-25 MED FILL — ALLOPURINOL 300 MG TABLET: 300 | 30 days supply | Qty: 30 | Fill #3

## 2016-08-25 MED FILL — KOMBIGLYZE XR 2.5-1,000 MG: 2.5-1000 | 30 days supply | Qty: 30 | Fill #7

## 2016-08-28 MED FILL — PANTOPRAZOLE SOD DR 40 MG T: 40 | 30 days supply | Qty: 60 | Fill #0

## 2016-09-26 DIAGNOSIS — E119 Type 2 diabetes mellitus without complications: Secondary | ICD-10-CM | POA: Diagnosis not present

## 2016-09-26 DIAGNOSIS — I10 Essential (primary) hypertension: Secondary | ICD-10-CM | POA: Diagnosis not present

## 2016-09-26 DIAGNOSIS — Z114 Encounter for screening for human immunodeficiency virus [HIV]: Secondary | ICD-10-CM | POA: Diagnosis not present

## 2016-09-26 DIAGNOSIS — E782 Mixed hyperlipidemia: Secondary | ICD-10-CM | POA: Diagnosis not present

## 2016-10-03 DIAGNOSIS — J45909 Unspecified asthma, uncomplicated: Secondary | ICD-10-CM | POA: Diagnosis not present

## 2016-10-03 DIAGNOSIS — Z0000 Encounter for general adult medical examination without abnormal findings: Secondary | ICD-10-CM | POA: Diagnosis not present

## 2016-10-03 DIAGNOSIS — G43109 Migraine with aura, not intractable, without status migrainosus: Secondary | ICD-10-CM | POA: Diagnosis not present

## 2016-10-03 DIAGNOSIS — M109 Gout, unspecified: Secondary | ICD-10-CM | POA: Diagnosis not present

## 2016-10-03 DIAGNOSIS — R7301 Impaired fasting glucose: Secondary | ICD-10-CM | POA: Diagnosis not present

## 2016-10-03 DIAGNOSIS — K219 Gastro-esophageal reflux disease without esophagitis: Secondary | ICD-10-CM | POA: Diagnosis not present

## 2016-10-03 DIAGNOSIS — E782 Mixed hyperlipidemia: Secondary | ICD-10-CM | POA: Diagnosis not present

## 2016-10-03 DIAGNOSIS — M545 Low back pain: Secondary | ICD-10-CM | POA: Diagnosis not present

## 2016-10-08 MED FILL — COLCHICINE 0.6 MG TABLET: 0.6 | 10 days supply | Qty: 30 | Fill #0

## 2016-10-13 MED FILL — ALLOPURINOL 300 MG TABLET: 300 | 30 days supply | Qty: 30 | Fill #4

## 2016-10-13 MED FILL — PANTOPRAZOLE SOD DR 40 MG T: 40 | 30 days supply | Qty: 60 | Fill #1

## 2016-10-13 MED FILL — KOMBIGLYZE XR 2.5-1,000 MG: 2.5-1000 | 30 days supply | Qty: 30 | Fill #8

## 2016-10-13 MED FILL — PRAVASTATIN NA 40 MG TAB: 40 | 30 days supply | Qty: 30 | Fill #3

## 2016-10-21 ENCOUNTER — Emergency Department (HOSPITAL_COMMUNITY)
Admission: EM | Admit: 2016-10-21 | Discharge: 2016-10-21 | Disposition: A | Discharge status: Home / Self Care | Payer: 59 | Attending: Emergency Medicine | Admitting: Emergency Medicine

## 2016-10-21 ENCOUNTER — Encounter (HOSPITAL_COMMUNITY): Payer: Self-pay | Admitting: *Deleted

## 2016-10-21 ENCOUNTER — Emergency Department (HOSPITAL_COMMUNITY): Payer: 59

## 2016-10-21 DIAGNOSIS — J40 Bronchitis, not specified as acute or chronic: Secondary | ICD-10-CM | POA: Diagnosis not present

## 2016-10-21 DIAGNOSIS — R091 Pleurisy: Secondary | ICD-10-CM | POA: Diagnosis not present

## 2016-10-21 DIAGNOSIS — E119 Type 2 diabetes mellitus without complications: Secondary | ICD-10-CM | POA: Insufficient documentation

## 2016-10-21 DIAGNOSIS — F17210 Nicotine dependence, cigarettes, uncomplicated: Secondary | ICD-10-CM | POA: Diagnosis not present

## 2016-10-21 DIAGNOSIS — Z79899 Other long term (current) drug therapy: Secondary | ICD-10-CM | POA: Insufficient documentation

## 2016-10-21 DIAGNOSIS — R079 Chest pain, unspecified: Secondary | ICD-10-CM | POA: Diagnosis not present

## 2016-10-21 DIAGNOSIS — R0789 Other chest pain: Secondary | ICD-10-CM | POA: Diagnosis not present

## 2016-10-21 LAB — BASIC METABOLIC PANEL
ANION GAP: 9 (ref 5–15)
BUN: 10 mg/dL (ref 6–20)
CALCIUM: 9.1 mg/dL (ref 8.9–10.3)
CO2: 27 mmol/L (ref 22–32)
Chloride: 101 mmol/L (ref 101–111)
Creatinine, Ser: 0.8 mg/dL (ref 0.44–1.00)
Glucose, Bld: 87 mg/dL (ref 65–99)
Potassium: 3.4 mmol/L — ABNORMAL LOW (ref 3.5–5.1)
SODIUM: 137 mmol/L (ref 135–145)

## 2016-10-21 LAB — CBC
HCT: 46 % (ref 36.0–46.0)
HEMOGLOBIN: 15.4 g/dL — AB (ref 12.0–15.0)
MCH: 30.3 pg (ref 26.0–34.0)
MCHC: 33.5 g/dL (ref 30.0–36.0)
MCV: 90.6 fL (ref 78.0–100.0)
PLATELETS: 297 10*3/uL (ref 150–400)
RBC: 5.08 MIL/uL (ref 3.87–5.11)
RDW: 13.7 % (ref 11.5–15.5)
WBC: 14.8 10*3/uL — AB (ref 4.0–10.5)

## 2016-10-21 LAB — I-STAT TROPONIN, ED: TROPONIN I, POC: 0 ng/mL (ref 0.00–0.08)

## 2016-10-21 LAB — TROPONIN I

## 2016-10-21 LAB — D-DIMER, QUANTITATIVE (NOT AT ARMC)

## 2016-10-21 MED ORDER — AZITHROMYCIN 250 MG PO TABS: 250.0000 mg | ORAL_TABLET | Freq: Every day | ORAL | 0 refills | Status: DC

## 2016-10-21 MED ORDER — IBUPROFEN 800 MG PO TABS: 800.0000 mg | ORAL_TABLET | Freq: Three times a day (TID) | ORAL | 0 refills | Status: DC

## 2016-10-21 MED ORDER — IPRATROPIUM-ALBUTEROL 0.5-2.5 (3) MG/3ML IN SOLN
3.0000 mL | Freq: Once | RESPIRATORY_TRACT | Status: DC
  Filled 2016-10-21: qty 3

## 2016-10-21 MED ORDER — IPRATROPIUM-ALBUTEROL 0.5-2.5 (3) MG/3ML IN SOLN
3.0000 mL | Freq: Once | RESPIRATORY_TRACT | Status: AC
  Administered 2016-10-21: 3 mL via RESPIRATORY_TRACT
  Filled 2016-10-21: qty 3

## 2016-10-21 MED ORDER — METHYLPREDNISOLONE SODIUM SUCC 125 MG IJ SOLR
125.0000 mg | Freq: Once | INTRAMUSCULAR | Status: AC
  Administered 2016-10-21: 125 mg via INTRAVENOUS
  Filled 2016-10-21: qty 2

## 2016-10-21 MED ORDER — KETOROLAC TROMETHAMINE 30 MG/ML IJ SOLN
15.0000 mg | Freq: Once | INTRAMUSCULAR | Status: AC
  Administered 2016-10-21: 15 mg via INTRAVENOUS
  Filled 2016-10-21: qty 1

## 2016-10-21 MED ORDER — PREDNISONE 20 MG PO TABS: ORAL_TABLET | ORAL | 0 refills | Status: DC

## 2016-10-21 NOTE — ED Provider Notes (Signed)
[facility] EMERGENCY DEPARTMENT Provider Note   CSN: [id number] Arrival date & time: 10/21/16  0518     History   Chief Complaint Chief Complaint  Patient presents with  . Chest Pain    HPI Cheryl Black is a 31 y.o. female.  The history is provided by the patient.  Chest Pain   This is a new problem. The current episode started 3 to 5 hours ago. The problem occurs constantly. The problem has not changed since onset.The pain is present in the substernal region. The pain is moderate. The quality of the pain is described as sharp. The pain radiates to the upper back. Pertinent negatives include no diaphoresis, no fever, no hemoptysis, no lower extremity edema, no shortness of breath and no vomiting. Treatments tried: ASA/NSAIDs. Risk factors include smoking/tobacco exposure.  Pertinent negatives for past medical history include no CAD and no PE.   Pt reports while working tonight she had onset of sharp CP that radiates to back.  At times it is worse with breathing No fever/vomiting No hemoptysis No h/o CAD/PE She does not take oral contraceptives She is a smoker  Past Medical History:  Diagnosis Date  . Acid reflux   . Clostridium difficile infection    in the past  . Diabetes mellitus   . Fatty liver   . Fibrocystic breast disease   . Gout   . Hidradenitis   . History of kidney stones   . Hyperlipemia   . Obesity   . Polycystic ovary syndrome   . PONV (postoperative nausea and vomiting)   . Smoker     Patient Active Problem List   Diagnosis Date Noted  . C. difficile enteritis 02/01/2012  . Hyponatremia 01/07/2011  . Hypokalemia 01/07/2011  . Elevated LFTs 01/07/2011  . Hypomagnesemia 01/07/2011  . Diabetes mellitus type 2 in obese ([facility]) 01/06/2011  . Morbid obesity ([facility]) 01/06/2011  . Polycystic ovary disease 01/06/2011  . Tobacco abuse 01/06/2011  . FATTY LIVER DISEASE 06/08/2008  . TRANSAMINASES, SERUM, ELEVATED 06/08/2008    Past Surgical  History:  Procedure Laterality Date  . CHOLECYSTECTOMY  01/10/2011   Procedure: LAPAROSCOPIC CHOLECYSTECTOMY;  Surgeon: Dalia Heading;  Location: [facility];  Service: General;  Laterality: N/A;  . HYDRADENITIS EXCISION Right 06/18/2012   Procedure: EXCISION HYDRIADENITIS SUPRATIVA  RIGHT AXILLA;  Surgeon: Marlane Hatcher, MD;  Location: [facility];  Service: General;  Laterality: Right;  . LIVER BIOPSY  01/10/2011   Procedure: LIVER BIOPSY;  Surgeon: Dalia Heading;  Location: [facility];  Service: General;;  . SKIN SPLIT GRAFT Right 06/18/2012   Procedure: SKIN GRAFT SPLIT THICKNESS RIGHT AXILLA(WITH TWO STANDARD SKIN BOARDS 3"x 8" )  DONOR SITE RIGHT AND LEFT THIGHS;  Surgeon: Marlane Hatcher, MD;  Location: [facility];  Service: General;  Laterality: Right;    OB History    Gravida Para Term Preterm AB Living   0 0 0 0 0 0   SAB TAB Ectopic Multiple Live Births   0 0 0 0 0       Home Medications    Prior to Admission medications   Medication Sig Start Date End Date Taking? Authorizing Provider  albuterol (PROVENTIL HFA;VENTOLIN HFA) 108 (90 Base) MCG/ACT inhaler Inhale 1-2 puffs into the lungs every 6 (six) hours as needed for wheezing or shortness of breath.   Yes [provider]  allopurinol (ZYLOPRIM) 300 MG tablet Take 300 mg by mouth daily.    [provider]  benzonatate (TESSALON) 100 MG capsule Take 1 capsule (100 mg total) by mouth every 8 (eight) hours. 03/09/16   Eber Hong, MD  colchicine 0.6 MG tablet Take 0.6 mg by mouth daily as needed. gout    [provider]  KOMBIGLYZE XR 2.05-998 MG TB24 daily.  02/13/16   [provider]  ondansetron (ZOFRAN) 4 MG tablet Take 4 mg by mouth as needed for nausea or vomiting.    [provider]  pantoprazole (PROTONIX) 40 MG tablet Take 40 mg by mouth 2 (two) times daily.  02/13/16   [provider]  pravastatin (PRAVACHOL) 20 MG tablet Take 10 mg by mouth daily.     [provider]    promethazine (PHENERGAN) 12.5 MG tablet Take 12.5 mg by mouth as needed for nausea or vomiting.    [provider]  rizatriptan (MAXALT) 5 MG tablet Take 5 mg by mouth as needed for migraine. May repeat in 2 hours if needed    [provider]    Family History Family History  Problem Relation Age of Onset  . Stroke Other        paternal great grandfather  . Diabetes Father   . Hypertension Father   . Cancer Maternal Grandfather        unclear  . Heart attack Paternal Grandfather   . Hypertension Paternal Grandmother   . Diabetes Paternal Grandmother   . Gout Paternal Grandmother   . Hypertension Maternal Grandmother   . Other Mother        pre-cancerous cells; had hyst  . Gout Brother   . Cancer Sister        cervical; had hyst    Social History Social History  Substance Use Topics  . Smoking status: Current Every Day Smoker    Packs/day: 0.50    Years: 10.00    Types: Cigarettes  . Smokeless tobacco: Never Used  . Alcohol use Yes     Comment: twice a month     Allergies   Ace inhibitors; Beta adrenergic blockers; Augmentin [amoxicillin-pot clavulanate]; Benicar [olmesartan]; Doxycycline; Influenza vaccine live; and Invokana [canagliflozin]   Review of Systems Review of Systems  Constitutional: Negative for diaphoresis and fever.  Respiratory: Negative for hemoptysis and shortness of breath.   Cardiovascular: Positive for chest pain.  Gastrointestinal: Negative for vomiting.  All other systems reviewed and are negative.    Physical Exam Updated Vital Signs BP 119/85   Pulse 73   Temp 98.2 F (36.8 C) (Oral)   Resp 16   Ht 1.753 m ( )   Wt 130.6 kg (288 lb)   LMP 09/21/2016   SpO2 99%   BMI 42.53 kg/m   Physical Exam CONSTITUTIONAL: Well developed/well nourished HEAD: Normocephalic/atraumatic EYES: EOMI/PERRL ENMT: Mucous membranes moist NECK: supple no meningeal signs SPINE/BACK:entire spine nontender CV: S1/S2 noted, no  murmurs/rubs/gallops noted LUNGS:scattered wheeze noted, no apparent distress ABDOMEN: soft, nontender, no rebound or guarding, bowel sounds noted throughout abdomen GU:no cva tenderness NEURO: Pt is awake/alert/appropriate, moves all extremitiesx4.  No facial droop.   EXTREMITIES: pulses normal/equal, full ROM, no calf tenderness noted SKIN: warm, color normal PSYCH: no abnormalities of mood noted, alert and oriented to situation   ED Treatments / Results  Labs (all labs ordered are listed, but only abnormal results are displayed) Labs Reviewed  BASIC METABOLIC PANEL - Abnormal; Notable for the following:       Result Value   Potassium 3.4 (*)  All other components within normal limits  CBC - Abnormal; Notable for the following:    WBC 14.8 (*)    Hemoglobin 15.4 (*)    All other components within normal limits  D-DIMER, QUANTITATIVE (NOT AT [facility])  I-STAT TROPONIN, ED    EKG  EKG Interpretation  Date/Time:  Tuesday October 21 2016 05:24:39 EDT Ventricular Rate:  68 PR Interval:    QRS Duration: 86 QT Interval:  403 QTC Calculation: 429 R Axis:   66 Text Interpretation:  Sinus rhythm Low voltage, precordial leads No significant change since last tracing Confirmed by Zadie Rhine ([id number]) on 10/21/2016 5:30:49 AM       Radiology Dg Chest 2 View  Result Date: 10/21/2016 CLINICAL DATA:  Severe left chest pain radiating to the back. EXAM: CHEST  2 VIEW COMPARISON:  03/09/2016 FINDINGS: The heart size and mediastinal contours are within normal limits. Both lungs are clear. The visualized skeletal structures are unremarkable. IMPRESSION: No active cardiopulmonary disease. Electronically Signed   By: Burman Nieves M.D.   On: 10/21/2016 05:55    Procedures Procedures   Medications Ordered in ED Medications  ipratropium-albuterol (DUONEB) 0.5-2.5 (3) MG/3ML nebulizer solution 3 mL (not administered)     Initial Impression / Assessment and Plan / ED Course  I  have reviewed the triage vital signs and the nursing notes.  Pertinent labs & imaging results that were available during my care of the patient were reviewed by me and considered in my medical decision making (see chart for details).    6:16 AM Pt stable Currently PERC negative HEART score - 2 Could be pleurisy vs. Pericarditis She declines pain meds Will give neb treatment and reassess 6:45 AM Patient reports CP worse, now with deep breathing She had brief episodes of pulse ox <94% Will proceed with d-dimer testing 7:03 AM Signed out to dr Clayborne Dana, f/u d-dimer and repeat at troponin at 830am Final Clinical Impressions(s) / ED Diagnoses   Final diagnoses:  Pleurisy    New Prescriptions New Prescriptions   No medications on file     Zadie Rhine, MD 10/21/16 [phone]

## 2016-10-21 NOTE — ED Provider Notes (Signed)
9:56 AM Assumed care from Dr. Christy Gentles, please see their note for full history, physical and decision making until this point. In brief this is a 31 y.o. year old female who presented to the ED tonight with Chest Pain     32 year old smoker without too many medical problems and a heart score too presents with left-sided chest pain that is pleuritic in nature. Plan is to await delta troponins and d-dimer for further disposition. My review of her labs she has elevated white blood cell count, normal chest x-ray and a slightly low potassium. She also has an elevated hemoglobin consistent with smoking. On my evaluation, she has diffuse wheezing and decreased BS. Hand over left side of chest and sharp pleuritic pain. VS WNL. Patient states she had a viral illness this weekend at which time she had some cough, elevated temperatures and low O2 saturations.  Suspect bronchitis as cause for symptoms, less likely ACS/PE but will continue workup for those. Will start prednisone and duonebs and consider antibiotics depending on rest of workup.   Reevaluation and feeling better after steroids/toradol. Normal VS. Normal respiratory effort.  Feels pain again after duoneb. Troponin negative, repeat ECG unchanged, normal d dimer.  Suspect bronchospasm/bronchitis. Will dc on antiinflammatories, prednisone, albuterol and z pack if symptoms not improving in 2-3 days or any changes c/w infection arise. otherwise stable for dc w/ pcp follow up.   Discharge instructions, including strict return precautions for new or worsening symptoms, given. Patient and/or family verbalized understanding and agreement with the plan as described.   Labs, studies and imaging reviewed by myself and considered in medical decision making if ordered. Imaging interpreted by radiology.  Labs Reviewed  BASIC METABOLIC PANEL - Abnormal; Notable for the following:       Result Value   Potassium 3.4 (*)    All other components within normal  limits  CBC - Abnormal; Notable for the following:    WBC 14.8 (*)    Hemoglobin 15.4 (*)    All other components within normal limits  D-DIMER, QUANTITATIVE (NOT AT [facility])  TROPONIN I  I-STAT TROPONIN, ED    DG Chest 2 View  Final Result      No Follow-up on file.    Merrily Pew, MD 10/21/16 [phone]

## 2016-10-21 NOTE — ED Notes (Signed)
Pt reports pain that decreased to 2 after pain med, but increased to 7 after neb due to taking deep breaths

## 2016-10-21 NOTE — ED Triage Notes (Addendum)
Pt c/o severe left side chest pain that radiates around to her back; pt states she feels "hot"; pt took aspirin  and ibuprofen  x 1 hour ago; pt states the pain started around 3am

## 2016-10-23 DIAGNOSIS — R0789 Other chest pain: Secondary | ICD-10-CM | POA: Diagnosis not present

## 2016-10-23 DIAGNOSIS — J208 Acute bronchitis due to other specified organisms: Secondary | ICD-10-CM | POA: Diagnosis not present

## 2016-11-17 MED FILL — ALLOPURINOL 300 MG TABLET: 300 | 30 days supply | Qty: 30 | Fill #5

## 2016-11-19 MED FILL — KOMBIGLYZE XR 2.5-1,000 MG: 2.5-1000 | 30 days supply | Qty: 30 | Fill #0

## 2016-12-26 DIAGNOSIS — I10 Essential (primary) hypertension: Secondary | ICD-10-CM | POA: Diagnosis not present

## 2016-12-26 DIAGNOSIS — E782 Mixed hyperlipidemia: Secondary | ICD-10-CM | POA: Diagnosis not present

## 2016-12-26 DIAGNOSIS — E119 Type 2 diabetes mellitus without complications: Secondary | ICD-10-CM | POA: Diagnosis not present

## 2017-01-03 DIAGNOSIS — Z6841 Body Mass Index (BMI) 40.0 and over, adult: Secondary | ICD-10-CM | POA: Diagnosis not present

## 2017-01-03 DIAGNOSIS — J019 Acute sinusitis, unspecified: Secondary | ICD-10-CM | POA: Diagnosis not present

## 2017-01-05 DIAGNOSIS — Z6841 Body Mass Index (BMI) 40.0 and over, adult: Secondary | ICD-10-CM | POA: Diagnosis not present

## 2017-01-05 DIAGNOSIS — R945 Abnormal results of liver function studies: Secondary | ICD-10-CM | POA: Diagnosis not present

## 2017-01-05 DIAGNOSIS — J019 Acute sinusitis, unspecified: Secondary | ICD-10-CM | POA: Diagnosis not present

## 2017-01-05 DIAGNOSIS — J45909 Unspecified asthma, uncomplicated: Secondary | ICD-10-CM | POA: Diagnosis not present

## 2017-01-05 DIAGNOSIS — M109 Gout, unspecified: Secondary | ICD-10-CM | POA: Diagnosis not present

## 2017-01-05 DIAGNOSIS — E119 Type 2 diabetes mellitus without complications: Secondary | ICD-10-CM | POA: Diagnosis not present

## 2017-01-05 DIAGNOSIS — E782 Mixed hyperlipidemia: Secondary | ICD-10-CM | POA: Diagnosis not present

## 2017-01-05 DIAGNOSIS — F17200 Nicotine dependence, unspecified, uncomplicated: Secondary | ICD-10-CM | POA: Diagnosis not present

## 2017-01-05 MED FILL — ALLOPURINOL 300 MG TABLET: 300 | 30 days supply | Qty: 30 | Fill #0

## 2017-01-05 MED FILL — PRAVASTATIN NA 40 MG TAB: 40 | 30 days supply | Qty: 30 | Fill #4

## 2017-01-05 MED FILL — PANTOPRAZOLE SOD DR 40 MG T: 40 | 30 days supply | Qty: 60 | Fill #2

## 2017-01-05 MED FILL — KOMBIGLYZE XR 2.5-1,000 MG: 2.5-1000 | 30 days supply | Qty: 30 | Fill #1

## 2017-01-20 DIAGNOSIS — E119 Type 2 diabetes mellitus without complications: Secondary | ICD-10-CM | POA: Diagnosis not present

## 2017-01-20 DIAGNOSIS — J45909 Unspecified asthma, uncomplicated: Secondary | ICD-10-CM | POA: Diagnosis not present

## 2017-01-20 DIAGNOSIS — I10 Essential (primary) hypertension: Secondary | ICD-10-CM | POA: Diagnosis not present

## 2017-01-20 DIAGNOSIS — M545 Low back pain: Secondary | ICD-10-CM | POA: Diagnosis not present

## 2017-01-20 DIAGNOSIS — E782 Mixed hyperlipidemia: Secondary | ICD-10-CM | POA: Diagnosis not present

## 2017-01-20 DIAGNOSIS — F17200 Nicotine dependence, unspecified, uncomplicated: Secondary | ICD-10-CM | POA: Diagnosis not present

## 2017-01-20 DIAGNOSIS — K219 Gastro-esophageal reflux disease without esophagitis: Secondary | ICD-10-CM | POA: Diagnosis not present

## 2017-01-20 DIAGNOSIS — R945 Abnormal results of liver function studies: Secondary | ICD-10-CM | POA: Diagnosis not present

## 2017-02-19 MED FILL — ALLOPURINOL 300 MG TABLET: 300 | 30 days supply | Qty: 30 | Fill #1

## 2017-02-19 MED FILL — KOMBIGLYZE XR 2.5-1,000 MG: 2.5-1000 | 30 days supply | Qty: 30 | Fill #2

## 2017-02-19 MED FILL — PANTOPRAZOLE SOD DR 40 MG T: 40 | 90 days supply | Qty: 180 | Fill #3

## 2017-04-03 DIAGNOSIS — F17200 Nicotine dependence, unspecified, uncomplicated: Secondary | ICD-10-CM | POA: Diagnosis not present

## 2017-04-03 DIAGNOSIS — R945 Abnormal results of liver function studies: Secondary | ICD-10-CM | POA: Diagnosis not present

## 2017-04-03 DIAGNOSIS — M545 Low back pain: Secondary | ICD-10-CM | POA: Diagnosis not present

## 2017-04-03 DIAGNOSIS — J45909 Unspecified asthma, uncomplicated: Secondary | ICD-10-CM | POA: Diagnosis not present

## 2017-04-03 DIAGNOSIS — K219 Gastro-esophageal reflux disease without esophagitis: Secondary | ICD-10-CM | POA: Diagnosis not present

## 2017-04-03 DIAGNOSIS — I10 Essential (primary) hypertension: Secondary | ICD-10-CM | POA: Diagnosis not present

## 2017-04-03 DIAGNOSIS — N390 Urinary tract infection, site not specified: Secondary | ICD-10-CM | POA: Diagnosis not present

## 2017-04-03 DIAGNOSIS — E119 Type 2 diabetes mellitus without complications: Secondary | ICD-10-CM | POA: Diagnosis not present

## 2017-04-03 DIAGNOSIS — E782 Mixed hyperlipidemia: Secondary | ICD-10-CM | POA: Diagnosis not present

## 2017-04-06 DIAGNOSIS — E782 Mixed hyperlipidemia: Secondary | ICD-10-CM | POA: Diagnosis not present

## 2017-04-06 DIAGNOSIS — K219 Gastro-esophageal reflux disease without esophagitis: Secondary | ICD-10-CM | POA: Diagnosis not present

## 2017-04-06 DIAGNOSIS — J45909 Unspecified asthma, uncomplicated: Secondary | ICD-10-CM | POA: Diagnosis not present

## 2017-04-06 DIAGNOSIS — M545 Low back pain: Secondary | ICD-10-CM | POA: Diagnosis not present

## 2017-04-06 DIAGNOSIS — R945 Abnormal results of liver function studies: Secondary | ICD-10-CM | POA: Diagnosis not present

## 2017-04-06 DIAGNOSIS — E792 Myoadenylate deaminase deficiency: Secondary | ICD-10-CM | POA: Diagnosis not present

## 2017-04-06 DIAGNOSIS — I10 Essential (primary) hypertension: Secondary | ICD-10-CM | POA: Diagnosis not present

## 2017-04-06 DIAGNOSIS — F17200 Nicotine dependence, unspecified, uncomplicated: Secondary | ICD-10-CM | POA: Diagnosis not present

## 2017-04-06 DIAGNOSIS — E119 Type 2 diabetes mellitus without complications: Secondary | ICD-10-CM | POA: Diagnosis not present

## 2017-04-10 DIAGNOSIS — Z6841 Body Mass Index (BMI) 40.0 and over, adult: Secondary | ICD-10-CM | POA: Diagnosis not present

## 2017-04-10 DIAGNOSIS — E119 Type 2 diabetes mellitus without complications: Secondary | ICD-10-CM | POA: Diagnosis not present

## 2017-04-10 DIAGNOSIS — M109 Gout, unspecified: Secondary | ICD-10-CM | POA: Diagnosis not present

## 2017-04-10 DIAGNOSIS — R945 Abnormal results of liver function studies: Secondary | ICD-10-CM | POA: Diagnosis not present

## 2017-04-10 DIAGNOSIS — J45909 Unspecified asthma, uncomplicated: Secondary | ICD-10-CM | POA: Diagnosis not present

## 2017-04-10 DIAGNOSIS — E782 Mixed hyperlipidemia: Secondary | ICD-10-CM | POA: Diagnosis not present

## 2017-04-13 MED FILL — KOMBIGLYZE XR 2.5-1,000 MG: 2.5-1000 | 30 days supply | Qty: 30 | Fill #3

## 2017-04-13 MED FILL — ALLOPURINOL 300 MG TABLET: 300 | 30 days supply | Qty: 30 | Fill #2

## 2017-05-18 MED FILL — ALLOPURINOL 300 MG TABLET: 300 | 30 days supply | Qty: 30 | Fill #3

## 2017-05-18 MED FILL — KOMBIGLYZE XR 2.5-1,000 MG: 2.5-1000 | 30 days supply | Qty: 30 | Fill #4

## 2017-05-18 MED FILL — PANTOPRAZOLE SOD DR 40 MG T: 40 | 90 days supply | Qty: 180 | Fill #4

## 2017-06-05 DIAGNOSIS — Z6841 Body Mass Index (BMI) 40.0 and over, adult: Secondary | ICD-10-CM | POA: Diagnosis not present

## 2017-06-05 DIAGNOSIS — E782 Mixed hyperlipidemia: Secondary | ICD-10-CM | POA: Diagnosis not present

## 2017-06-05 DIAGNOSIS — N390 Urinary tract infection, site not specified: Secondary | ICD-10-CM | POA: Diagnosis not present

## 2017-06-05 DIAGNOSIS — E119 Type 2 diabetes mellitus without complications: Secondary | ICD-10-CM | POA: Diagnosis not present

## 2017-06-05 DIAGNOSIS — K219 Gastro-esophageal reflux disease without esophagitis: Secondary | ICD-10-CM | POA: Diagnosis not present

## 2017-06-05 DIAGNOSIS — I10 Essential (primary) hypertension: Secondary | ICD-10-CM | POA: Diagnosis not present

## 2017-06-05 DIAGNOSIS — F17200 Nicotine dependence, unspecified, uncomplicated: Secondary | ICD-10-CM | POA: Diagnosis not present

## 2017-06-05 DIAGNOSIS — J45909 Unspecified asthma, uncomplicated: Secondary | ICD-10-CM | POA: Diagnosis not present

## 2017-06-05 MED FILL — SULFAMETHOXAZOLE-TMP DS TAB: 800-160 | 7 days supply | Qty: 14 | Fill #0

## 2017-06-19 DIAGNOSIS — H52221 Regular astigmatism, right eye: Secondary | ICD-10-CM | POA: Diagnosis not present

## 2017-06-19 DIAGNOSIS — H5213 Myopia, bilateral: Secondary | ICD-10-CM | POA: Diagnosis not present

## 2017-06-25 DIAGNOSIS — E119 Type 2 diabetes mellitus without complications: Secondary | ICD-10-CM | POA: Diagnosis not present

## 2017-06-25 DIAGNOSIS — E782 Mixed hyperlipidemia: Secondary | ICD-10-CM | POA: Diagnosis not present

## 2017-06-25 MED FILL — KOMBIGLYZE XR 2.5-1,000 MG: 2.5-1000 | 30 days supply | Qty: 30 | Fill #0

## 2017-06-25 MED FILL — ALLOPURINOL 300 MG TABLET: 300 | 30 days supply | Qty: 30 | Fill #4

## 2017-08-17 MED FILL — PANTOPRAZOLE SOD DR 40 MG T: 40 | 90 days supply | Qty: 180 | Fill #5

## 2017-08-17 MED FILL — KOMBIGLYZE XR 2.5-1,000 MG: 2.5-1000 | 30 days supply | Qty: 30 | Fill #1

## 2017-08-17 MED FILL — ALLOPURINOL 300 MG TABS: 300 | 30 days supply | Qty: 30 | Fill #5

## 2017-08-23 ENCOUNTER — Encounter (HOSPITAL_COMMUNITY): Payer: Self-pay | Admitting: Emergency Medicine

## 2017-08-23 ENCOUNTER — Emergency Department (HOSPITAL_COMMUNITY): Payer: 59

## 2017-08-23 ENCOUNTER — Other Ambulatory Visit: Payer: Self-pay

## 2017-08-23 ENCOUNTER — Emergency Department (HOSPITAL_COMMUNITY)
Admission: EM | Admit: 2017-08-23 | Discharge: 2017-08-23 | Disposition: A | Discharge status: Home / Self Care | Payer: 59 | Source: Home / Self Care | Attending: Emergency Medicine | Admitting: Emergency Medicine

## 2017-08-23 DIAGNOSIS — Z6841 Body Mass Index (BMI) 40.0 and over, adult: Secondary | ICD-10-CM | POA: Diagnosis not present

## 2017-08-23 DIAGNOSIS — J189 Pneumonia, unspecified organism: Secondary | ICD-10-CM | POA: Diagnosis not present

## 2017-08-23 DIAGNOSIS — J9601 Acute respiratory failure with hypoxia: Secondary | ICD-10-CM | POA: Diagnosis not present

## 2017-08-23 DIAGNOSIS — R0603 Acute respiratory distress: Secondary | ICD-10-CM | POA: Diagnosis not present

## 2017-08-23 DIAGNOSIS — J45901 Unspecified asthma with (acute) exacerbation: Secondary | ICD-10-CM | POA: Diagnosis not present

## 2017-08-23 DIAGNOSIS — J40 Bronchitis, not specified as acute or chronic: Secondary | ICD-10-CM | POA: Insufficient documentation

## 2017-08-23 DIAGNOSIS — Z23 Encounter for immunization: Secondary | ICD-10-CM | POA: Diagnosis not present

## 2017-08-23 DIAGNOSIS — R0602 Shortness of breath: Secondary | ICD-10-CM

## 2017-08-23 DIAGNOSIS — E662 Morbid (severe) obesity with alveolar hypoventilation: Secondary | ICD-10-CM | POA: Diagnosis not present

## 2017-08-23 DIAGNOSIS — F17210 Nicotine dependence, cigarettes, uncomplicated: Secondary | ICD-10-CM

## 2017-08-23 DIAGNOSIS — J9801 Acute bronchospasm: Secondary | ICD-10-CM | POA: Diagnosis not present

## 2017-08-23 DIAGNOSIS — Z79899 Other long term (current) drug therapy: Secondary | ICD-10-CM

## 2017-08-23 DIAGNOSIS — E119 Type 2 diabetes mellitus without complications: Secondary | ICD-10-CM

## 2017-08-23 DIAGNOSIS — J9811 Atelectasis: Secondary | ICD-10-CM | POA: Diagnosis not present

## 2017-08-23 DIAGNOSIS — T380X5A Adverse effect of glucocorticoids and synthetic analogues, initial encounter: Secondary | ICD-10-CM | POA: Diagnosis not present

## 2017-08-23 DIAGNOSIS — J060 Acute laryngopharyngitis: Secondary | ICD-10-CM | POA: Diagnosis not present

## 2017-08-23 LAB — CBC WITH DIFFERENTIAL/PLATELET
Basophils Absolute: 0.1 10*3/uL (ref 0.0–0.1)
Basophils Relative: 0 %
EOS ABS: 0.8 10*3/uL — AB (ref 0.0–0.7)
EOS PCT: 7 %
HCT: 46.6 % — ABNORMAL HIGH (ref 36.0–46.0)
Hemoglobin: 16.1 g/dL — ABNORMAL HIGH (ref 12.0–15.0)
Lymphocytes Relative: 25 %
Lymphs Abs: 2.9 10*3/uL (ref 0.7–4.0)
MCH: 31.3 pg (ref 26.0–34.0)
MCHC: 34.5 g/dL (ref 30.0–36.0)
MCV: 90.5 fL (ref 78.0–100.0)
MONO ABS: 0.8 10*3/uL (ref 0.1–1.0)
Monocytes Relative: 7 %
Neutro Abs: 6.9 10*3/uL (ref 1.7–7.7)
Neutrophils Relative %: 61 %
PLATELETS: 259 10*3/uL (ref 150–400)
RBC: 5.15 MIL/uL — ABNORMAL HIGH (ref 3.87–5.11)
RDW: 13.3 % (ref 11.5–15.5)
WBC: 11.3 10*3/uL — ABNORMAL HIGH (ref 4.0–10.5)

## 2017-08-23 LAB — BASIC METABOLIC PANEL
ANION GAP: 8 (ref 5–15)
BUN: 9 mg/dL (ref 6–20)
CALCIUM: 8.8 mg/dL — AB (ref 8.9–10.3)
CO2: 24 mmol/L (ref 22–32)
Chloride: 105 mmol/L (ref 98–111)
Creatinine, Ser: 0.69 mg/dL (ref 0.44–1.00)
GFR calc Af Amer: >60 mL/min (ref >60)
GLUCOSE: 149 mg/dL — AB (ref 70–99)
Potassium: 3.7 mmol/L (ref 3.5–5.1)
SODIUM: 137 mmol/L (ref 135–145)

## 2017-08-23 LAB — I-STAT TROPONIN, ED: TROPONIN I, POC: 0 ng/mL (ref 0.00–0.08)

## 2017-08-23 LAB — I-STAT BETA HCG BLOOD, ED (MC, WL, AP ONLY)

## 2017-08-23 LAB — I-STAT CG4 LACTIC ACID, ED: Lactic Acid, Venous: 1.96 mmol/L — ABNORMAL HIGH (ref 0.5–1.9)

## 2017-08-23 MED ORDER — ALBUTEROL SULFATE (2.5 MG/3ML) 0.083% IN NEBU
5.0000 mg | INHALATION_SOLUTION | Freq: Once | RESPIRATORY_TRACT | Status: AC
  Administered 2017-08-23: 5 mg via RESPIRATORY_TRACT
  Filled 2017-08-23: qty 6

## 2017-08-23 MED ORDER — ALBUTEROL SULFATE (5 MG/ML) 0.5% IN NEBU: 2.5000 mg | INHALATION_SOLUTION | Freq: Four times a day (QID) | RESPIRATORY_TRACT | 0 refills | Status: DC | PRN

## 2017-08-23 MED ORDER — METHYLPREDNISOLONE SODIUM SUCC 125 MG IJ SOLR: 125.0000 mg | Freq: Once | INTRAMUSCULAR | Status: DC

## 2017-08-23 MED ORDER — ALBUTEROL SULFATE HFA 108 (90 BASE) MCG/ACT IN AERS
2.0000 | INHALATION_SPRAY | Freq: Once | RESPIRATORY_TRACT | Status: AC
  Administered 2017-08-23: 2 via RESPIRATORY_TRACT
  Filled 2017-08-23: qty 6.7

## 2017-08-23 MED ORDER — IPRATROPIUM BROMIDE 0.02 % IN SOLN
0.5000 mg | Freq: Once | RESPIRATORY_TRACT | Status: AC
  Administered 2017-08-23: 0.5 mg via RESPIRATORY_TRACT
  Filled 2017-08-23: qty 2.5

## 2017-08-23 MED ORDER — PREDNISONE 20 MG PO TABS: 60.0000 mg | ORAL_TABLET | Freq: Every day | ORAL | 0 refills | Status: DC

## 2017-08-23 MED ORDER — ALBUTEROL SULFATE (2.5 MG/3ML) 0.083% IN NEBU
INHALATION_SOLUTION | RESPIRATORY_TRACT | Status: AC
  Filled 2017-08-23: qty 3

## 2017-08-23 MED ORDER — ALBUTEROL SULFATE (2.5 MG/3ML) 0.083% IN NEBU
2.5000 mg | INHALATION_SOLUTION | Freq: Once | RESPIRATORY_TRACT | Status: AC
  Administered 2017-08-23: 2.5 mg via RESPIRATORY_TRACT

## 2017-08-23 MED ORDER — METHYLPREDNISOLONE SODIUM SUCC 125 MG IJ SOLR
125.0000 mg | Freq: Once | INTRAMUSCULAR | Status: AC
  Administered 2017-08-23: 125 mg via INTRAVENOUS
  Filled 2017-08-23: qty 2

## 2017-08-23 MED ORDER — SODIUM CHLORIDE 0.9 % IV BOLUS
1000.0000 mL | Freq: Once | INTRAVENOUS | Status: AC
  Administered 2017-08-23: 1000 mL via INTRAVENOUS

## 2017-08-23 MED ORDER — IPRATROPIUM BROMIDE 0.02 % IN SOLN: 0.5000 mg | Freq: Four times a day (QID) | RESPIRATORY_TRACT | 0 refills | Status: DC

## 2017-08-23 NOTE — Discharge Instructions (Signed)
Your labs, EKG and chest x-ray are overall reassuring.  Chest x-ray shows no evidence of pneumonia.  I think this is all bronchospasm and bronchitis after a viral upper respiratory infection.  Please take 60 mg prednisone for the next 5 days, and for the next 24 hours use albuterol every 6 hours, after that as needed.  Follow-up closely with your primary care doctor.  Return to the emergency department for any worsening shortness of breath, chest pain, fevers or any other new or concerning symptoms.

## 2017-08-23 NOTE — ED Provider Notes (Addendum)
[facility] EMERGENCY DEPARTMENT Provider Note   CSN: [id number] Arrival date & time: 08/23/17  1647     History   Chief Complaint Chief Complaint  Patient presents with  . Shortness of Breath    HPI Cheryl LotKristy A Espy is a 32 Black.o. female.  Cheryl Black is a 32 Black.o. Female with diabetes, hyperlipidemia, PCOS, smoking, gout, GERD, and multiple episodes of pneumonia, who presents to the emergency department for evaluation of 2 days of worsening shortness of breath.  Patient reports initially shortness of breath was primarily with exertion but now she also feels short of breath at rest with increased work of breathing.  She reports associated chest pain and tightness that is in the middle of her chest and radiates back towards her back.  She reports with the symptoms she has had some low-grade fevers over the past 2 days, took 800 mg ibuprofen at 3:30 prior to arrival.  She denies cough, but reports she has had some nasal congestion and rhinorrhea which has been clear.  No sore throat.  Patient denies associated nausea, or vomiting, has been intermittently diaphoretic.  No abdominal pain.  No leg swelling or pain, no recent long distance travel or travel out of the country, no prior history of DVT or PE, not currently on any OCPs.  No prior history of cardiac events.       Past Medical History:  Diagnosis Date  . Acid reflux   . Clostridium difficile infection    in the past  . Diabetes mellitus   . Fatty liver   . Fibrocystic breast disease   . Gout   . Hidradenitis   . History of kidney stones   . Hyperlipemia   . Obesity   . Polycystic ovary syndrome   . PONV (postoperative nausea and vomiting)   . Smoker     Patient Active Problem List   Diagnosis Date Noted  . C. difficile enteritis 02/01/2012  . Hyponatremia 01/07/2011  . Hypokalemia 01/07/2011  . Elevated LFTs 01/07/2011  . Hypomagnesemia 01/07/2011  . Diabetes mellitus type 2 in obese (HCC) 01/06/2011  . Morbid  obesity (HCC) 01/06/2011  . Polycystic ovary disease 01/06/2011  . Tobacco abuse 01/06/2011  . FATTY LIVER DISEASE 06/08/2008  . TRANSAMINASES, SERUM, ELEVATED 06/08/2008    Past Surgical History:  Procedure Laterality Date  . CHOLECYSTECTOMY  01/10/2011   Procedure: LAPAROSCOPIC CHOLECYSTECTOMY;  Surgeon: Dalia HeadingMark A Jenkins;  Location: [facility];  Service: General;  Laterality: N/A;  . HYDRADENITIS EXCISION Right 06/18/2012   Procedure: EXCISION HYDRIADENITIS SUPRATIVA  RIGHT AXILLA;  Surgeon: Marlane HatcherWilliam S Bradford, MD;  Location: [facility];  Service: General;  Laterality: Right;  . LIVER BIOPSY  01/10/2011   Procedure: LIVER BIOPSY;  Surgeon: Dalia HeadingMark A Jenkins;  Location: [facility];  Service: General;;  . SKIN SPLIT GRAFT Right 06/18/2012   Procedure: SKIN GRAFT SPLIT THICKNESS RIGHT AXILLA(WITH TWO STANDARD SKIN BOARDS 3"x 8" )  DONOR SITE RIGHT AND LEFT THIGHS;  Surgeon: Marlane HatcherWilliam S Bradford, MD;  Location: [facility];  Service: General;  Laterality: Right;     OB History    Gravida  0   Para  0   Term  0   Preterm  0   AB  0   Living  0     SAB  0   TAB  0   Ectopic  0   Multiple  0   Live Births  0  Home Medications    Prior to Admission medications   Medication Sig Start Date End Date Taking? Authorizing Provider  allopurinol (ZYLOPRIM) 300 MG tablet Take 300 mg by mouth every evening.    Yes [provider]  Calcium Carb-Cholecalciferol [phone] MG-UNIT CAPS Take 2 capsules by mouth every evening.   Yes [provider]  fexofenadine (ALLEGRA) 180 MG tablet Take 180 mg by mouth every evening.   Yes [provider]  fluticasone (FLONASE) 50 MCG/ACT nasal spray Place 2 sprays into both nostrils every evening.   Yes [provider]  ibuprofen (ADVIL,MOTRIN) 200 MG tablet Take 800 mg by mouth every 6 (six) hours as needed for mild pain or moderate pain.   Yes [provider]  KOMBIGLYZE XR 2.05-998 MG TB24 Take 1 tablet by mouth  every evening.  02/13/16  Yes [provider]  pantoprazole (PROTONIX) 40 MG tablet Take 40-80 mg by mouth every evening.  02/13/16  Yes [provider]  rizatriptan (MAXALT) 5 MG tablet Take 5 mg by mouth as needed for migraine. May repeat in 2 hours if needed   Yes [provider]  albuterol (PROVENTIL) (5 MG/ML) 0.5% nebulizer solution Take 0.5 mLs (2.5 mg total) by nebulization every 6 (six) hours as needed for wheezing or shortness of breath. 08/23/17   Dartha Lodge, PA-C  ipratropium (ATROVENT) 0.02 % nebulizer solution Take 2.5 mLs (0.5 mg total) by nebulization 4 (four) times daily. 08/23/17   Dartha Lodge, PA-C  predniSONE (DELTASONE) 20 MG tablet Take 3 tablets (60 mg total) by mouth daily for 5 days. 08/23/17 08/28/17  Dartha Lodge, PA-C    Family History Family History  Problem Relation Age of Onset  . Stroke Other        paternal great grandfather  . Diabetes Father   . Hypertension Father   . Cancer Maternal Grandfather        unclear  . Heart attack Paternal Grandfather   . Hypertension Paternal Grandmother   . Diabetes Paternal Grandmother   . Gout Paternal Grandmother   . Hypertension Maternal Grandmother   . Other Mother        pre-cancerous cells; had hyst  . Gout Brother   . Cancer Sister        cervical; had hyst    Social History Social History   Tobacco Use  . Smoking status: Current Every Day Smoker    Packs/day: 0.50    Years: 10.00    Pack years: 5.00    Types: Cigarettes  . Smokeless tobacco: Never Used  Substance Use Topics  . Alcohol use: Yes    Comment: twice a month  . Drug use: No     Allergies   Ace inhibitors; Beta adrenergic blockers; Augmentin [amoxicillin-pot clavulanate]; Benicar [olmesartan]; Doxycycline; Influenza vaccine live; and Invokana [canagliflozin]   Review of Systems Review of Systems  Constitutional: Positive for chills, diaphoresis and fever.  HENT: Positive for congestion and rhinorrhea.  Negative for ear pain and sore throat.   Respiratory: Positive for chest tightness, shortness of breath and wheezing. Negative for cough.   Cardiovascular: Positive for chest pain. Negative for palpitations and leg swelling.  Gastrointestinal: Negative for abdominal pain, nausea and vomiting.  Genitourinary: Negative for dysuria and frequency.  Musculoskeletal: Negative for arthralgias and myalgias.  Skin: Negative for color change and rash.  Neurological: Negative for dizziness, syncope and light-headedness.  All other systems reviewed and are negative.    Physical Exam  Updated Vital Signs BP (!) 128/91 (BP Location: Left Arm)   Pulse 75   Temp 98.6 F (37 C) (Oral)   Resp (!) 26   SpO2 95%   Physical Exam  Constitutional: She is oriented to person, place, and time. She appears well-developed and well-nourished. She appears ill. No distress.  HENT:  Head: Normocephalic and atraumatic.  Mouth/Throat: Oropharynx is clear and moist.  Moderate nasal mucosal edema with some clear rhinorrhea present  Eyes: Right eye exhibits no discharge. Left eye exhibits no discharge.  Neck: Normal range of motion. Neck supple.  Cardiovascular: Normal rate, regular rhythm, normal heart sounds and intact distal pulses. Exam reveals no gallop and no friction rub.  No murmur heard. Pulmonary/Chest: No respiratory distress. She has wheezes.  Patient does have some increased work of breathing, tachypnea with respiratory rate of 26, but normal O2 sat on room air, lungs with diffuse wheezing throughout, worse in lung bases.   Abdominal: Soft. Bowel sounds are normal. She exhibits no distension and no mass. There is no tenderness. There is no guarding.  Abdomen soft, nondistended, nontender to palpation in all quadrants without guarding or peritoneal signs  Musculoskeletal: She exhibits no edema or tenderness.  Neurological: She is alert and oriented to person, place, and time. Coordination normal.  Skin:  Skin is warm and dry. Capillary refill takes less than 2 seconds. She is not diaphoretic.  Psychiatric: She has a normal mood and affect. Her behavior is normal.  Nursing note and vitals reviewed.    ED Treatments / Results  Labs (all labs ordered are listed, but only abnormal results are displayed) Labs Reviewed  CBC WITH DIFFERENTIAL/PLATELET - Abnormal; Notable for the following components:      Result Value   WBC 11.3 (*)    RBC 5.15 (*)    Hemoglobin 16.1 (*)    HCT 46.6 (*)    Eosinophils Absolute 0.8 (*)    All other components within normal limits  BASIC METABOLIC PANEL - Abnormal; Notable for the following components:   Glucose, Bld 149 (*)    Calcium 8.8 (*)    All other components within normal limits  I-STAT CG4 LACTIC ACID, ED - Abnormal; Notable for the following components:   Lactic Acid, Venous 1.96 (*)    All other components within normal limits  I-STAT TROPONIN, ED  I-STAT BETA HCG BLOOD, ED ([facility], [facility], [facility] ONLY)    EKG EKG Interpretation  Date/Time:  Sunday August 23 2017 16:57:26 EDT Ventricular Rate:  68 PR Interval:    QRS Duration: 83 QT Interval:  414 QTC Calculation: 441 R Axis:   47 Text Interpretation:  Sinus rhythm Low voltage, precordial leads Baseline wander in lead(s) V6 Confirmed by Benjiman CorePickering, Nathan [phone]([id number]) on 08/23/2017 6:02:03 PM   Radiology Dg Chest 2 View  Result Date: 08/23/2017 CLINICAL DATA:  Wheezing and shortness of breath EXAM: CHEST - 2 VIEW COMPARISON:  October 21, 2016 FINDINGS: There is no evident edema or consolidation. Heart size and pulmonary vascularity are normal. No adenopathy. No pneumothorax. No bone lesions. IMPRESSION: No edema or consolidation. Electronically Signed   By: Bretta BangWilliam  Woodruff III M.D.   On: 08/23/2017 18:19    Procedures Procedures (including critical care time)  Medications Ordered in ED Medications  albuterol (PROVENTIL) (2.5 MG/3ML) 0.083% nebulizer solution (  Not Given 08/23/17 1703)    albuterol (PROVENTIL) (2.5 MG/3ML) 0.083% nebulizer solution 2.5 mg (2.5 mg Nebulization Given 08/23/17 1702)  albuterol (PROVENTIL) (2.5 MG/3ML) 0.083%  nebulizer solution 5 mg (5 mg Nebulization Given 08/23/17 1717)  ipratropium (ATROVENT) nebulizer solution 0.5 mg (0.5 mg Nebulization Given 08/23/17 1718)  methylPREDNISolone sodium succinate (SOLU-MEDROL) 125 mg/2 mL injection 125 mg (125 mg Intravenous Given 08/23/17 1716)  sodium chloride 0.9 % bolus 1,000 mL (0 mLs Intravenous Stopped 08/23/17 1842)  albuterol (PROVENTIL) (2.5 MG/3ML) 0.083% nebulizer solution 5 mg (5 mg Nebulization Given 08/23/17 1944)  ipratropium (ATROVENT) nebulizer solution 0.5 mg (0.5 mg Nebulization Given 08/23/17 1943)  albuterol (PROVENTIL HFA;VENTOLIN HFA) 108 (90 Base) MCG/ACT inhaler 2 puff (2 puffs Inhalation Given 08/23/17 2036)     Initial Impression / Assessment and Plan / ED Course  I have reviewed the triage vital signs and the nursing notes.  Pertinent labs & imaging results that were available during my care of the patient were reviewed by me and considered in my medical decision making (see chart for details).  Presents for 2 days of progressively worsening shortness of breath with associated chest tightness and central chest pain.  On arrival patient tachypneic with increased work of breathing but satting well on room air.  Patient denies history of asthma or COPD but is a current smoker, does have a history of pneumonia with similar presentation.  Reports low-grade fevers at home.  On exam lungs with diffuse wheezing throughout and some decreased air movement.  Patient appears somewhat ill and diaphoretic, does have some active chest tightness at this time I think this is more likely due to shortness of breath than acute ACS.  Will get EKG, chest x-ray, labs including troponin and lactic acid.  Given that patient has active wheezing on exam, is not tachycardic not currently on any estrogen therapy, without  recent travel or surgery and no prior history of DVT or PE I have extremely low suspicion for PE.  Will give nebulizer treatment, IV Solu-Medrol and 1 L fluid bolus.  EKG without concerning ischemic changes, troponin negative.  Borderline lactic acid of 1.96.  Mild leukocytosis of 11.3,, patient also has slightly elevated hemoglobin of 16, I think this may be due to hemoconcentration from dehydration.  No acute electrolyte derangements, glucose is somewhat elevated at 149, patient not fasting.  Normal renal function.  Negative pregnancy.  Chest x-ray shows no edema or consolidation, personally reviewed images and agree with radiology findings.  After steroids and two duo nebs patient has had significant improvement in work of breathing, respirations equal and unlabored on room air and lungs clear to auscultation, patient reports she is feeling much better.  Without obvious pneumonia on chest x-ray, think this is likely bronchitis and bronchospasm with some viral upper respiratory infection given associated congestion and rhinorrhea.  Patient ambulated throughout the department while maintaining O2 sats between 97-98% and normal work of breathing.  We will plan to treat with short steroid burst and continued nebulizer treatments at home.   At this time there does not appear to be any evidence of an acute emergency medical condition and the patient appears stable for discharge with appropriate outpatient follow up. Diagnosis was discussed with patient who verbalizes understanding and is agreeable to discharge.    Final Clinical Impressions(s) / ED Diagnoses   Final diagnoses:  SOB (shortness of breath)  Bronchitis    ED Discharge Orders         Ordered    albuterol (PROVENTIL) (5 MG/ML) 0.5% nebulizer solution  Every 6 hours PRN     08/23/17 2038    ipratropium (ATROVENT) 0.02 % nebulizer  solution  4 times daily     08/23/17 2038    predniSONE (DELTASONE) 20 MG tablet  Daily     08/23/17 2038            Legrand Rams 08/23/17 2124    Benjiman Core, MD 08/23/17 2342    Dartha Lodge, PA-C 09/09/17 2124    Benjiman Core, MD 09/10/17 [phone]

## 2017-08-23 NOTE — ED Notes (Signed)
Patient ambulating around nurses station, oxygen saturation at 98% room air.

## 2017-08-23 NOTE — ED Triage Notes (Signed)
PT c/o SOB with and without exertion worsening over the past two days with chest pain in the middle that radiates to her back. PT states some low grade fever the past 2 days and she took 800mg  ibuprofen at 1530 prior to ED arrival.

## 2017-08-24 DIAGNOSIS — Z6841 Body Mass Index (BMI) 40.0 and over, adult: Secondary | ICD-10-CM | POA: Diagnosis not present

## 2017-08-24 DIAGNOSIS — J45901 Unspecified asthma with (acute) exacerbation: Secondary | ICD-10-CM | POA: Diagnosis not present

## 2017-08-24 DIAGNOSIS — R0602 Shortness of breath: Secondary | ICD-10-CM | POA: Diagnosis not present

## 2017-08-24 DIAGNOSIS — J060 Acute laryngopharyngitis: Secondary | ICD-10-CM | POA: Diagnosis not present

## 2017-08-25 ENCOUNTER — Other Ambulatory Visit: Payer: Self-pay

## 2017-08-25 ENCOUNTER — Inpatient Hospital Stay (HOSPITAL_COMMUNITY)
Admission: EM | Admit: 2017-08-25 | Discharge: 2017-08-31 | DRG: 202 | Disposition: A | Discharge status: Home / Self Care | Payer: 59 | Attending: Internal Medicine | Admitting: Internal Medicine

## 2017-08-25 ENCOUNTER — Emergency Department (HOSPITAL_COMMUNITY): Payer: 59

## 2017-08-25 ENCOUNTER — Encounter (HOSPITAL_COMMUNITY): Payer: Self-pay | Admitting: *Deleted

## 2017-08-25 DIAGNOSIS — E282 Polycystic ovarian syndrome: Secondary | ICD-10-CM | POA: Diagnosis present

## 2017-08-25 DIAGNOSIS — J9811 Atelectasis: Secondary | ICD-10-CM | POA: Diagnosis present

## 2017-08-25 DIAGNOSIS — J9601 Acute respiratory failure with hypoxia: Secondary | ICD-10-CM | POA: Diagnosis not present

## 2017-08-25 DIAGNOSIS — X58XXXA Exposure to other specified factors, initial encounter: Secondary | ICD-10-CM | POA: Diagnosis present

## 2017-08-25 DIAGNOSIS — Z79899 Other long term (current) drug therapy: Secondary | ICD-10-CM | POA: Diagnosis not present

## 2017-08-25 DIAGNOSIS — Z791 Long term (current) use of non-steroidal anti-inflammatories (NSAID): Secondary | ICD-10-CM

## 2017-08-25 DIAGNOSIS — R079 Chest pain, unspecified: Secondary | ICD-10-CM | POA: Diagnosis not present

## 2017-08-25 DIAGNOSIS — K760 Fatty (change of) liver, not elsewhere classified: Secondary | ICD-10-CM | POA: Diagnosis present

## 2017-08-25 DIAGNOSIS — E669 Obesity, unspecified: Secondary | ICD-10-CM | POA: Diagnosis not present

## 2017-08-25 DIAGNOSIS — Z6841 Body Mass Index (BMI) 40.0 and over, adult: Secondary | ICD-10-CM

## 2017-08-25 DIAGNOSIS — R0602 Shortness of breath: Secondary | ICD-10-CM | POA: Diagnosis present

## 2017-08-25 DIAGNOSIS — E1169 Type 2 diabetes mellitus with other specified complication: Secondary | ICD-10-CM | POA: Diagnosis not present

## 2017-08-25 DIAGNOSIS — Z8249 Family history of ischemic heart disease and other diseases of the circulatory system: Secondary | ICD-10-CM | POA: Diagnosis not present

## 2017-08-25 DIAGNOSIS — J9801 Acute bronchospasm: Secondary | ICD-10-CM | POA: Diagnosis not present

## 2017-08-25 DIAGNOSIS — Z881 Allergy status to other antibiotic agents status: Secondary | ICD-10-CM

## 2017-08-25 DIAGNOSIS — Z823 Family history of stroke: Secondary | ICD-10-CM

## 2017-08-25 DIAGNOSIS — Z888 Allergy status to other drugs, medicaments and biological substances status: Secondary | ICD-10-CM | POA: Diagnosis not present

## 2017-08-25 DIAGNOSIS — E119 Type 2 diabetes mellitus without complications: Secondary | ICD-10-CM | POA: Diagnosis present

## 2017-08-25 DIAGNOSIS — Z716 Tobacco abuse counseling: Secondary | ICD-10-CM

## 2017-08-25 DIAGNOSIS — R0902 Hypoxemia: Secondary | ICD-10-CM

## 2017-08-25 DIAGNOSIS — Z23 Encounter for immunization: Secondary | ICD-10-CM

## 2017-08-25 DIAGNOSIS — J189 Pneumonia, unspecified organism: Secondary | ICD-10-CM | POA: Diagnosis not present

## 2017-08-25 DIAGNOSIS — R062 Wheezing: Secondary | ICD-10-CM | POA: Diagnosis not present

## 2017-08-25 DIAGNOSIS — Z720 Tobacco use: Secondary | ICD-10-CM | POA: Diagnosis present

## 2017-08-25 DIAGNOSIS — K219 Gastro-esophageal reflux disease without esophagitis: Secondary | ICD-10-CM | POA: Diagnosis present

## 2017-08-25 DIAGNOSIS — R0789 Other chest pain: Secondary | ICD-10-CM | POA: Diagnosis not present

## 2017-08-25 DIAGNOSIS — F17210 Nicotine dependence, cigarettes, uncomplicated: Secondary | ICD-10-CM | POA: Diagnosis present

## 2017-08-25 DIAGNOSIS — E785 Hyperlipidemia, unspecified: Secondary | ICD-10-CM | POA: Diagnosis present

## 2017-08-25 DIAGNOSIS — Z9049 Acquired absence of other specified parts of digestive tract: Secondary | ICD-10-CM | POA: Diagnosis not present

## 2017-08-25 DIAGNOSIS — R0603 Acute respiratory distress: Secondary | ICD-10-CM | POA: Diagnosis not present

## 2017-08-25 DIAGNOSIS — R0689 Other abnormalities of breathing: Secondary | ICD-10-CM | POA: Diagnosis not present

## 2017-08-25 DIAGNOSIS — E66813 Obesity, class 3: Secondary | ICD-10-CM | POA: Diagnosis present

## 2017-08-25 DIAGNOSIS — Z8049 Family history of malignant neoplasm of other genital organs: Secondary | ICD-10-CM | POA: Diagnosis not present

## 2017-08-25 DIAGNOSIS — I361 Nonrheumatic tricuspid (valve) insufficiency: Secondary | ICD-10-CM | POA: Diagnosis not present

## 2017-08-25 DIAGNOSIS — T380X5A Adverse effect of glucocorticoids and synthetic analogues, initial encounter: Secondary | ICD-10-CM | POA: Diagnosis present

## 2017-08-25 DIAGNOSIS — Z833 Family history of diabetes mellitus: Secondary | ICD-10-CM | POA: Diagnosis not present

## 2017-08-25 DIAGNOSIS — E662 Morbid (severe) obesity with alveolar hypoventilation: Secondary | ICD-10-CM | POA: Diagnosis present

## 2017-08-25 DIAGNOSIS — Z87442 Personal history of urinary calculi: Secondary | ICD-10-CM

## 2017-08-25 DIAGNOSIS — J80 Acute respiratory distress syndrome: Secondary | ICD-10-CM | POA: Diagnosis not present

## 2017-08-25 DIAGNOSIS — F419 Anxiety disorder, unspecified: Secondary | ICD-10-CM | POA: Diagnosis present

## 2017-08-25 DIAGNOSIS — M109 Gout, unspecified: Secondary | ICD-10-CM | POA: Diagnosis present

## 2017-08-25 HISTORY — DX: Unspecified asthma, uncomplicated: J45.909

## 2017-08-25 LAB — CBC WITH DIFFERENTIAL/PLATELET
BASOS ABS: 0 10*3/uL (ref 0.0–0.1)
BASOS PCT: 0 %
Eosinophils Absolute: 0 10*3/uL (ref 0.0–0.7)
Eosinophils Relative: 0 %
HEMATOCRIT: 46.5 % — AB (ref 36.0–46.0)
HEMOGLOBIN: 15.7 g/dL — AB (ref 12.0–15.0)
Lymphocytes Relative: 10 %
Lymphs Abs: 1.5 10*3/uL (ref 0.7–4.0)
MCH: 30.8 pg (ref 26.0–34.0)
MCHC: 33.8 g/dL (ref 30.0–36.0)
MCV: 91.2 fL (ref 78.0–100.0)
Monocytes Absolute: 0.3 10*3/uL (ref 0.1–1.0)
Monocytes Relative: 2 %
NEUTROS ABS: 12.9 10*3/uL (ref 1.7–7.7)
NEUTROS PCT: 88 %
Platelets: 248 10*3/uL (ref 150–400)
RBC: 5.1 MIL/uL (ref 3.87–5.11)
RDW: 14 % (ref 11.5–15.5)
WBC: 14.7 10*3/uL — ABNORMAL HIGH (ref 4.0–10.5)

## 2017-08-25 LAB — BASIC METABOLIC PANEL
Anion gap: 15 (ref 5–15)
BUN: 13 mg/dL (ref 6–20)
CO2: 16 mmol/L — ABNORMAL LOW (ref 22–32)
CREATININE: 0.86 mg/dL (ref 0.44–1.00)
Calcium: 8.5 mg/dL — ABNORMAL LOW (ref 8.9–10.3)
Chloride: 102 mmol/L (ref 98–111)
GFR calc Af Amer: >60 mL/min (ref >60)
GLUCOSE: 386 mg/dL — AB (ref 70–99)
POTASSIUM: 3.9 mmol/L (ref 3.5–5.1)
Sodium: 133 mmol/L — ABNORMAL LOW (ref 135–145)

## 2017-08-25 LAB — GLUCOSE, CAPILLARY: Glucose-Capillary: 330 mg/dL — ABNORMAL HIGH (ref 70–99)

## 2017-08-25 LAB — TROPONIN I: Troponin I: <0.03 ng/mL (ref <0.03)

## 2017-08-25 LAB — D-DIMER, QUANTITATIVE (NOT AT ARMC)

## 2017-08-25 LAB — CBG MONITORING, ED: GLUCOSE-CAPILLARY: 373 mg/dL — AB (ref 70–99)

## 2017-08-25 LAB — I-STAT BETA HCG BLOOD, ED (MC, WL, AP ONLY)

## 2017-08-25 MED ORDER — ALBUTEROL SULFATE (2.5 MG/3ML) 0.083% IN NEBU
2.5000 mg | INHALATION_SOLUTION | RESPIRATORY_TRACT | Status: DC | PRN
  Administered 2017-08-26 – 2017-08-30 (×3): 2.5 mg via RESPIRATORY_TRACT
  Filled 2017-08-25 (×3): qty 3

## 2017-08-25 MED ORDER — METHYLPREDNISOLONE SODIUM SUCC 125 MG IJ SOLR
80.0000 mg | Freq: Two times a day (BID) | INTRAMUSCULAR | Status: DC
  Administered 2017-08-25 – 2017-08-26 (×2): 80 mg via INTRAVENOUS
  Filled 2017-08-25 (×2): qty 2

## 2017-08-25 MED ORDER — NICOTINE 14 MG/24HR TD PT24
14.0000 mg | MEDICATED_PATCH | Freq: Every day | TRANSDERMAL | Status: DC
  Administered 2017-08-25 – 2017-08-31 (×7): 14 mg via TRANSDERMAL
  Filled 2017-08-25 (×7): qty 1

## 2017-08-25 MED ORDER — ENOXAPARIN SODIUM 40 MG/0.4ML ~~LOC~~ SOLN
40.0000 mg | SUBCUTANEOUS | Status: DC
  Administered 2017-08-25 – 2017-08-30 (×6): 40 mg via SUBCUTANEOUS
  Filled 2017-08-25 (×6): qty 0.4

## 2017-08-25 MED ORDER — IPRATROPIUM-ALBUTEROL 0.5-2.5 (3) MG/3ML IN SOLN
RESPIRATORY_TRACT | Status: AC
  Administered 2017-08-25: 3 mL via RESPIRATORY_TRACT
  Filled 2017-08-25: qty 3

## 2017-08-25 MED ORDER — METHYLPREDNISOLONE SODIUM SUCC 125 MG IJ SOLR
INTRAMUSCULAR | Status: AC
  Filled 2017-08-25: qty 2

## 2017-08-25 MED ORDER — IPRATROPIUM BROMIDE 0.02 % IN SOLN
0.5000 mg | Freq: Four times a day (QID) | RESPIRATORY_TRACT | Status: DC
  Administered 2017-08-26 (×3): 0.5 mg via RESPIRATORY_TRACT
  Filled 2017-08-25 (×3): qty 2.5

## 2017-08-25 MED ORDER — SODIUM CHLORIDE 0.9% FLUSH
3.0000 mL | Freq: Two times a day (BID) | INTRAVENOUS | Status: DC
  Administered 2017-08-25 – 2017-08-30 (×10): 3 mL via INTRAVENOUS

## 2017-08-25 MED ORDER — SODIUM CHLORIDE 0.9 % IV SOLN
500.0000 mg | INTRAVENOUS | Status: DC
  Administered 2017-08-25 – 2017-08-28 (×4): 500 mg via INTRAVENOUS
  Filled 2017-08-25 (×6): qty 500

## 2017-08-25 MED ORDER — SODIUM CHLORIDE 0.9 % IV SOLN
1.0000 g | Freq: Once | INTRAVENOUS | Status: AC
  Administered 2017-08-25: 1 g via INTRAVENOUS
  Filled 2017-08-25: qty 10

## 2017-08-25 MED ORDER — METHYLPREDNISOLONE SODIUM SUCC 125 MG IJ SOLR
125.0000 mg | Freq: Once | INTRAMUSCULAR | Status: AC
  Administered 2017-08-25: 125 mg via INTRAVENOUS

## 2017-08-25 MED ORDER — MAGNESIUM SULFATE 2 GM/50ML IV SOLN
2.0000 g | Freq: Once | INTRAVENOUS | Status: AC
  Administered 2017-08-25: 2 g via INTRAVENOUS
  Filled 2017-08-25: qty 50

## 2017-08-25 MED ORDER — SODIUM CHLORIDE 0.9% FLUSH
3.0000 mL | INTRAVENOUS | Status: DC | PRN
  Administered 2017-08-31: 3 mL via INTRAVENOUS
  Filled 2017-08-25: qty 3

## 2017-08-25 MED ORDER — IPRATROPIUM-ALBUTEROL 0.5-2.5 (3) MG/3ML IN SOLN
3.0000 mL | Freq: Once | RESPIRATORY_TRACT | Status: AC
  Administered 2017-08-25: 3 mL via RESPIRATORY_TRACT

## 2017-08-25 MED ORDER — INSULIN ASPART 100 UNIT/ML ~~LOC~~ SOLN
0.0000 [IU] | SUBCUTANEOUS | Status: DC
  Administered 2017-08-25 – 2017-08-26 (×2): 7 [IU] via SUBCUTANEOUS
  Administered 2017-08-26 (×2): 3 [IU] via SUBCUTANEOUS
  Administered 2017-08-26: 2 [IU] via SUBCUTANEOUS

## 2017-08-25 MED ORDER — PNEUMOCOCCAL VAC POLYVALENT 25 MCG/0.5ML IJ INJ
0.5000 mL | INJECTION | INTRAMUSCULAR | Status: AC
  Administered 2017-08-26: 0.5 mL via INTRAMUSCULAR
  Filled 2017-08-25: qty 0.5

## 2017-08-25 MED ORDER — SODIUM CHLORIDE 0.9 % IV BOLUS
500.0000 mL | Freq: Once | INTRAVENOUS | Status: AC
  Administered 2017-08-25: 500 mL via INTRAVENOUS

## 2017-08-25 MED ORDER — SODIUM CHLORIDE 0.9 % IV SOLN: 250.0000 mL | INTRAVENOUS | Status: DC | PRN

## 2017-08-25 MED ORDER — ALBUTEROL (5 MG/ML) CONTINUOUS INHALATION SOLN
10.0000 mg/h | INHALATION_SOLUTION | RESPIRATORY_TRACT | Status: AC
  Administered 2017-08-25: 10 mg/h via RESPIRATORY_TRACT

## 2017-08-25 MED ORDER — ALBUTEROL (5 MG/ML) CONTINUOUS INHALATION SOLN
INHALATION_SOLUTION | RESPIRATORY_TRACT | Status: AC
  Administered 2017-08-25: 10 mg/h via RESPIRATORY_TRACT
  Filled 2017-08-25: qty 20

## 2017-08-25 NOTE — Progress Notes (Signed)
Back on BIPAP and continuous neb

## 2017-08-25 NOTE — ED Provider Notes (Signed)
[facility] EMERGENCY DEPARTMENT Provider Note   CSN: [id number] Arrival date & time: 08/25/17  [id number]     History   Chief Complaint Chief Complaint  Patient presents with  . Respiratory Distress    HPI Cheryl Black is a 32 y.o. female.  Level 5 caveat for acuity of condition.  Patient presents with worsening dyspnea just prior to emergency department visit.  She had a similar episode on Sunday and was seen in the emergency department by discharge.  No prodromal illnesses.  Past medical history includes obesity, diabetes, hyperlipidemia, polycystic ovarian syndrome, cigarette smoking.     Past Medical History:  Diagnosis Date  . Acid reflux   . Clostridium difficile infection    in the past  . Diabetes mellitus   . Fatty liver   . Fibrocystic breast disease   . Gout   . Hidradenitis   . History of kidney stones   . Hyperlipemia   . Obesity   . Polycystic ovary syndrome   . PONV (postoperative nausea and vomiting)   . Smoker     Patient Active Problem List   Diagnosis Date Noted  . C. difficile enteritis 02/01/2012  . Hyponatremia 01/07/2011  . Hypokalemia 01/07/2011  . Elevated LFTs 01/07/2011  . Hypomagnesemia 01/07/2011  . Diabetes mellitus type 2 in obese (HCC) 01/06/2011  . Morbid obesity (HCC) 01/06/2011  . Polycystic ovary disease 01/06/2011  . Tobacco abuse 01/06/2011  . FATTY LIVER DISEASE 06/08/2008  . TRANSAMINASES, SERUM, ELEVATED 06/08/2008    Past Surgical History:  Procedure Laterality Date  . CHOLECYSTECTOMY  01/10/2011   Procedure: LAPAROSCOPIC CHOLECYSTECTOMY;  Surgeon: Dalia HeadingMark A Jenkins;  Location: [facility];  Service: General;  Laterality: N/A;  . HYDRADENITIS EXCISION Right 06/18/2012   Procedure: EXCISION HYDRIADENITIS SUPRATIVA  RIGHT AXILLA;  Surgeon: Marlane HatcherWilliam S Bradford, MD;  Location: [facility];  Service: General;  Laterality: Right;  . LIVER BIOPSY  01/10/2011   Procedure: LIVER BIOPSY;  Surgeon: Dalia HeadingMark A Jenkins;  Location: [facility];  Service:  General;;  . SKIN SPLIT GRAFT Right 06/18/2012   Procedure: SKIN GRAFT SPLIT THICKNESS RIGHT AXILLA(WITH TWO STANDARD SKIN BOARDS 3"x 8" )  DONOR SITE RIGHT AND LEFT THIGHS;  Surgeon: Marlane HatcherWilliam S Bradford, MD;  Location: [facility];  Service: General;  Laterality: Right;     OB History    Gravida  0   Para  0   Term  0   Preterm  0   AB  0   Living  0     SAB  0   TAB  0   Ectopic  0   Multiple  0   Live Births  0            Home Medications    Prior to Admission medications   Medication Sig Start Date End Date Taking? Authorizing Provider  albuterol (PROVENTIL) (5 MG/ML) 0.5% nebulizer solution Take 0.5 mLs (2.5 mg total) by nebulization every 6 (six) hours as needed for wheezing or shortness of breath. 08/23/17  Yes Dartha LodgeFord, Kelsey N, PA-C  allopurinol (ZYLOPRIM) 300 MG tablet Take 300 mg by mouth every evening.    Yes [provider]  azithromycin (ZITHROMAX) 250 MG tablet Take 250-500 mg by mouth See admin instructions. 500mg  on day 1 then take 250mg  on days 2 through 5 08/24/17  Yes [provider]  Calcium Carb-Cholecalciferol [phone] MG-UNIT CAPS Take 2 capsules by mouth every evening.   Yes [provider]  fexofenadine Joyce Copa(ALLEGRA)  180 MG tablet Take 180 mg by mouth every evening.   Yes [provider]  fluticasone (FLONASE) 50 MCG/ACT nasal spray Place 2 sprays into both nostrils every evening.   Yes [provider]  ibuprofen (ADVIL,MOTRIN) 200 MG tablet Take 800 mg by mouth every 6 (six) hours as needed for mild pain or moderate pain.   Yes [provider]  ipratropium (ATROVENT) 0.02 % nebulizer solution Take 2.5 mLs (0.5 mg total) by nebulization 4 (four) times daily. 08/23/17  Yes Ford, Kelsey N, PA-C  KOMBIGLYZE XR 2.05-998 MG TB24 Take 1 tablet by mouth every evening.  02/13/16  Yes [provider]  pantoprazole (PROTONIX) 40 MG tablet Take 40-80 mg by mouth every evening.  02/13/16  Yes [provider]  predniSONE (DELTASONE) 20 MG tablet Take 3 tablets (60 mg total) by mouth daily for 5 days. 08/23/17 08/28/17 Yes Dartha Lodge, PA-C  rizatriptan (MAXALT) 5 MG tablet Take 5 mg by mouth as needed for migraine. May repeat in 2 hours if needed   Yes [provider]    Family History Family History  Problem Relation Age of Onset  . Stroke Other        paternal great grandfather  . Diabetes Father   . Hypertension Father   . Cancer Maternal Grandfather        unclear  . Heart attack Paternal Grandfather   . Hypertension Paternal Grandmother   . Diabetes Paternal Grandmother   . Gout Paternal Grandmother   . Hypertension Maternal Grandmother   . Other Mother        pre-cancerous cells; had hyst  . Gout Brother   . Cancer Sister        cervical; had hyst    Social History Social History   Tobacco Use  . Smoking status: Current Every Day Smoker    Packs/day: 0.50    Years: 10.00    Pack years: 5.00    Types: Cigarettes  . Smokeless tobacco: Never Used  Substance Use Topics  . Alcohol use: Yes    Comment: twice a month  . Drug use: No     Allergies   Ace inhibitors; Beta adrenergic blockers; Augmentin [amoxicillin-pot clavulanate]; Benicar [olmesartan]; Doxycycline; Influenza vaccine live; and Invokana [canagliflozin]   Review of Systems Review of Systems  Unable to perform ROS: Acuity of condition     Physical Exam Updated Vital Signs BP 122/70   Pulse 84   Temp 97.7 F (36.5 C) (Axillary)   Resp (!) 21   Ht 5\' 8"  (1.727 m)   Wt 130.6 kg   LMP 08/19/2017   SpO2 100%   BMI 43.78 kg/m   Physical Exam  Constitutional: She is oriented to person, place, and time.  Elevated BMI, tachypneic, BiPAP in place  HENT:  Head: Normocephalic and atraumatic.  Eyes: Conjunctivae are normal.  Neck: Neck supple.  Cardiovascular: Normal rate and regular rhythm.  Pulmonary/Chest:  Dyspneic, tachypneic, not moving air well  Abdominal: Soft. Bowel sounds  are normal.  Musculoskeletal: Normal range of motion.  Neurological: She is alert and oriented to person, place, and time.  Skin: Skin is warm and dry.  Psychiatric: She has a normal mood and affect. Her behavior is normal.  Nursing note and vitals reviewed.    ED Treatments / Results  Labs (all labs ordered are listed, but only abnormal results are displayed) Labs Reviewed  CBC WITH DIFFERENTIAL/PLATELET - Abnormal; Notable for the following components:  Result Value   WBC 14.7 (*)    Hemoglobin 15.7 (*)    HCT 46.5 (*)    All other components within normal limits  BASIC METABOLIC PANEL - Abnormal; Notable for the following components:   Sodium 133 (*)    CO2 16 (*)    Glucose, Bld 386 (*)    Calcium 8.5 (*)    All other components within normal limits  CBG MONITORING, ED - Abnormal; Notable for the following components:   Glucose-Capillary 373 (*)    All other components within normal limits  TROPONIN I  D-DIMER, QUANTITATIVE (NOT AT [facility])  I-STAT BETA HCG BLOOD, ED ([facility], [facility], [facility] ONLY)    EKG None  Radiology Dg Chest Port 1 View  Result Date: 08/25/2017 CLINICAL DATA:  Increasing shortness of breath since 08/23/2017. EXAM: PORTABLE CHEST 1 VIEW COMPARISON:  PA and lateral chest 08/23/2017 and 10/21/2016. FINDINGS: Mild subsegmental atelectasis. There is some peribronchial thickening. No consolidative process is seen lung bases, pneumothorax or effusion. Heart size is normal. No acute or focal bony abnormality. IMPRESSION: Mild bronchitic change without consolidative process. Electronically Signed   By: Drusilla Kannerhomas  Dalessio M.D.   On: 08/25/2017 19:51    Procedures Procedures (including critical care time)  Medications Ordered in ED Medications  azithromycin (ZITHROMAX) 500 mg in sodium chloride 0.9 % 250 mL IVPB (500 mg Intravenous New Bag/Given 08/25/17 2033)  magnesium sulfate IVPB 2 g 50 mL (2 g Intravenous New Bag/Given 08/25/17 2032)  sodium chloride 0.9 % bolus  500 mL (0 mLs Intravenous Stopped 08/25/17 2008)  methylPREDNISolone sodium succinate (SOLU-MEDROL) 125 mg/2 mL injection 125 mg (125 mg Intravenous Given 08/25/17 1928)  ipratropium-albuterol (DUONEB) 0.5-2.5 (3) MG/3ML nebulizer solution 3 mL (3 mLs Nebulization Given 08/25/17 1937)  cefTRIAXone (ROCEPHIN) 1 g in sodium chloride 0.9 % 100 mL IVPB (0 g Intravenous Stopped 08/25/17 2040)     Initial Impression / Assessment and Plan / ED Course  I have reviewed the triage vital signs and the nursing notes.  Pertinent labs & imaging results that were available during my care of the patient were reviewed by me and considered in my medical decision making (see chart for details).     Patient presents with profound dyspnea.  BiPAP initiated by EMS.  Chest x-ray shows no obvious pneumonia or heart failure.  Gluclose elevated.  She has responded well to IV steroids, nebulizer treatments, fluids.  IV Rocephin and IV Zithromax initiated for possible early pneumonia.  Admit to general medicine.   CRITICAL CARE Performed by: Donnetta HutchingBrian Marinda Tyer Total critical care time: 30 minutes Critical care time was exclusive of separately billable procedures and treating other patients. Critical care was necessary to treat or prevent imminent or life-threatening deterioration. Critical care was time spent personally by me on the following activities: development of treatment plan with patient and/or surrogate as well as nursing, discussions with consultants, evaluation of patient's response to treatment, examination of patient, obtaining history from patient or surrogate, ordering and performing treatments and interventions, ordering and review of laboratory studies, ordering and review of radiographic studies, pulse oximetry and re-evaluation of patient's condition.  Final Clinical Impressions(s) / ED Diagnoses   Final diagnoses:  Respiratory distress    ED Discharge Orders    None       Donnetta Hutchingook, Carmella Kees, MD 08/25/17  2132

## 2017-08-25 NOTE — ED Triage Notes (Signed)
Pt brought in by [facility] for respiratory distress; pt was seen here for sob x 2 days ago and given antibiotics and steroids; pt administered albuterol prior to ems arrival and states it did not help; ems placed pt on bipap; pt c/o chest tightness and right lower lobe pain

## 2017-08-25 NOTE — Progress Notes (Signed)
BIPAP off per verbal order Dr. Onalee Huaavid

## 2017-08-25 NOTE — H&P (Signed)
History and Physical    Cheryl Black TOI:[id number] DOB: [birth year] DOA: 08/25/2017  PCP: [name], MD  Patient coming from: Home  Chief Complaint: Shortness of breath  HPI: Cheryl Black is a 32 y.o. female with medical history significant of diabetes, PCOS, obesity, pack a day smoker with no prior history of asthma or COPD comes in with several days of shortness of breath and wheezing at home.  Patient was seen here in the ED on August 18 and given oral steroids and albuterol to take at home.  She did this around the clock was not getting any better she saw her primary care physician yesterday who added on azithromycin orally and Spiriva inhaler.  Patient reports she continued to still not get better.  She denies any fevers but says that she is having a spasmy cough.  She denies any lower extremity swelling or edema.  She has no history of VT and has not really had any recent traveling.  She is not on any estrogen medications.  She does not have chest pain but reports some right sided posterior pain when taking a deep breath.  Patient is being referred for admission for bronchospasm.  She is been on BiPAP for the last couple of hours and she feels like this is helping her.  Per respiratory therapy she is wheezing less.  Patient be referred for admission for acute respiratory failure requiring BiPAP support.  Review of Systems: As per HPI otherwise 10 point review of systems negative.   Past Medical History:  Diagnosis Date  . Acid reflux   . Clostridium difficile infection    in the past  . Diabetes mellitus   . Fatty liver   . Fibrocystic breast disease   . Gout   . Hidradenitis   . History of kidney stones   . Hyperlipemia   . Obesity   . Polycystic ovary syndrome   . PONV (postoperative nausea and vomiting)   . Smoker     Past Surgical History:  Procedure Laterality Date  . CHOLECYSTECTOMY  01/10/2011   Procedure: LAPAROSCOPIC CHOLECYSTECTOMY;  Surgeon: Jamesetta So;   Location: [facility];  Service: General;  Laterality: N/A;  . HYDRADENITIS EXCISION Right 06/18/2012   Procedure: EXCISION HYDRIADENITIS SUPRATIVA  RIGHT AXILLA;  Surgeon: Scherry Ran, MD;  Location: [facility];  Service: General;  Laterality: Right;  . LIVER BIOPSY  01/10/2011   Procedure: LIVER BIOPSY;  Surgeon: Jamesetta So;  Location: [facility];  Service: General;;  . SKIN SPLIT GRAFT Right 06/18/2012   Procedure: SKIN GRAFT SPLIT THICKNESS RIGHT AXILLA(WITH TWO STANDARD SKIN BOARDS 3"x 8" )  DONOR SITE RIGHT AND LEFT THIGHS;  Surgeon: Scherry Ran, MD;  Location: [facility];  Service: General;  Laterality: Right;     reports that she has been smoking cigarettes. She has a 5.00 pack-year smoking history. She has never used smokeless tobacco. She reports that she drinks alcohol. She reports that she does not use drugs.  Allergies  Allergen Reactions  . Ace Inhibitors Anaphylaxis  . Beta Adrenergic Blockers Other (See Comments)    Angioedema   . Augmentin [Amoxicillin-Pot Clavulanate] Other (See Comments)    Has had C.diff in the past  . Benicar [Olmesartan] Other (See Comments)    Facial edema  . Doxycycline Other (See Comments)    Makes feet and hands red  . Influenza Vaccine Live Swelling    Arm   . Invokana [Canagliflozin] Other (See Comments)  Dehydration     Family History  Problem Relation Age of Onset  . Stroke Other        paternal great grandfather  . Diabetes Father   . Hypertension Father   . Cancer Maternal Grandfather        unclear  . Heart attack Paternal Grandfather   . Hypertension Paternal Grandmother   . Diabetes Paternal Grandmother   . Gout Paternal Grandmother   . Hypertension Maternal Grandmother   . Other Mother        pre-cancerous cells; had hyst  . Gout Brother   . Cancer Sister        cervical; had hyst    Prior to Admission medications   Medication Sig Start Date End Date Taking? Authorizing Provider  albuterol (PROVENTIL) (5 MG/ML)  0.5% nebulizer solution Take 0.5 mLs (2.5 mg total) by nebulization every 6 (six) hours as needed for wheezing or shortness of breath. 08/23/17  Yes Jacqlyn Larsen, PA-C  allopurinol (ZYLOPRIM) 300 MG tablet Take 300 mg by mouth every evening.    Yes [provider]  azithromycin (ZITHROMAX) 250 MG tablet Take 250-500 mg by mouth See admin instructions. 537m on day 1 then take 2563mon days 2 through 5 08/24/17  Yes [provider]  Calcium Carb-Cholecalciferol [phone] MG-UNIT CAPS Take 2 capsules by mouth every evening.   Yes [provider]  fexofenadine (ALLEGRA) 180 MG tablet Take 180 mg by mouth every evening.   Yes [provider]  fluticasone (FLONASE) 50 MCG/ACT nasal spray Place 2 sprays into both nostrils every evening.   Yes [provider]  ibuprofen (ADVIL,MOTRIN) 200 MG tablet Take 800 mg by mouth every 6 (six) hours as needed for mild pain or moderate pain.   Yes [provider]  ipratropium (ATROVENT) 0.02 % nebulizer solution Take 2.5 mLs (0.5 mg total) by nebulization 4 (four) times daily. 08/23/17  Yes Ford, Kelsey N, PA-C  KOMBIGLYZE XR 2.05-998 MG TB24 Take 1 tablet by mouth every evening.  02/13/16  Yes [provider]  pantoprazole (PROTONIX) 40 MG tablet Take 40-80 mg by mouth every evening.  02/13/16  Yes [provider]  predniSONE (DELTASONE) 20 MG tablet Take 3 tablets (60 mg total) by mouth daily for 5 days. 08/23/17 08/28/17 Yes FoJacqlyn LarsenPA-C  rizatriptan (MAXALT) 5 MG tablet Take 5 mg by mouth as needed for migraine. May repeat in 2 hours if needed   Yes [provider]    Physical Exam: Vitals:   08/25/17 2100 08/25/17 2115 08/25/17 2130 08/25/17 2145  BP: 122/70 108/87 121/70 (!) 129/96  Pulse: 84 81 77 98  Resp: (!) 21 18 20  (!) 30  Temp:      TempSrc:      SpO2: 100% 100% 99% 97%  Weight:      Height:          Constitutional: NAD, calm, comfortable Vitals:   08/25/17 2100  08/25/17 2115 08/25/17 2130 08/25/17 2145  BP: 122/70 108/87 121/70 (!) 129/96  Pulse: 84 81 77 98  Resp: (!) 21 18 20  (!) 30  Temp:      TempSrc:      SpO2: 100% 100% 99% 97%  Weight:      Height:       Eyes: PERRL, lids and conjunctivae normal ENMT: Mucous membranes are moist. Posterior pharynx clear of any exudate or lesions.Normal dentition.  Neck: normal, supple, no masses, no thyromegaly Respiratory: clear to  auscultation bilaterally, no wheezing, no crackles. Normal respiratory effort. No accessory muscle use.  Cardiovascular: Regular rate and rhythm, no murmurs / rubs / gallops. No extremity edema. 2+ pedal pulses. No carotid bruits.  Abdomen: no tenderness, no masses palpated. No hepatosplenomegaly. Bowel sounds positive.  Musculoskeletal: no clubbing / cyanosis. No joint deformity upper and lower extremities. Good ROM, no contractures. Normal muscle tone.  Skin: no rashes, lesions, ulcers. No induration Neurologic: CN 2-12 grossly intact. Sensation intact, DTR normal. Strength 5/5 in all 4.  Psychiatric: Normal judgment and insight. Alert and oriented x 3. Normal mood.    Labs on Admission: I have personally reviewed following labs and imaging studies  CBC: Recent Labs  Lab 08/23/17 1647 08/25/17 1928  WBC 11.3* 14.7*  NEUTROABS 6.9 12.9  HGB 16.1* 15.7*  HCT 46.6* 46.5*  MCV 90.5 91.2  PLT 259 248   Basic Metabolic Panel: Recent Labs  Lab 08/23/17 1647 08/25/17 2023  NA 137 133*  K 3.7 3.9  CL 105 102  CO2 24 16*  GLUCOSE 149* 386*  BUN 9 13  CREATININE 0.69 0.86  CALCIUM 8.8* 8.5*   GFR: Estimated Creatinine Clearance: 133.1 mL/min (by C-G formula based on SCr of 0.86 mg/dL). Liver Function Tests: No results for input(s): AST, ALT, ALKPHOS, BILITOT, PROT, ALBUMIN in the last 168 hours. No results for input(s): LIPASE, AMYLASE in the last 168 hours. No results for input(s): AMMONIA in the last 168 hours. Coagulation Profile: No results for input(s):  INR, PROTIME in the last 168 hours. Cardiac Enzymes: Recent Labs  Lab 08/25/17 1928  TROPONINI <0.03   BNP (last 3 results) No results for input(s): PROBNP in the last 8760 hours. HbA1C: No results for input(s): HGBA1C in the last 72 hours. CBG: Recent Labs  Lab 08/25/17 2041  GLUCAP 373*   Lipid Profile: No results for input(s): CHOL, HDL, LDLCALC, TRIG, CHOLHDL, LDLDIRECT in the last 72 hours. Thyroid Function Tests: No results for input(s): TSH, T4TOTAL, FREET4, T3FREE, THYROIDAB in the last 72 hours. Anemia Panel: No results for input(s): VITAMINB12, FOLATE, FERRITIN, TIBC, IRON, RETICCTPCT in the last 72 hours. Urine analysis:    Component Value Date/Time   COLORURINE YELLOW 08/17/2012 0339   APPEARANCEUR CLEAR 08/17/2012 0339   LABSPEC 1.020 08/17/2012 0339   PHURINE 5.5 08/17/2012 0339   GLUCOSEU >1000 (A) 08/17/2012 0339   HGBUR TRACE (A) 08/17/2012 0339   BILIRUBINUR NEGATIVE 08/17/2012 0339   KETONESUR NEGATIVE 08/17/2012 0339   PROTEINUR NEGATIVE 08/17/2012 0339   UROBILINOGEN 0.2 08/17/2012 0339   NITRITE NEGATIVE 08/17/2012 0339   LEUKOCYTESUR NEGATIVE 08/17/2012 0339   Sepsis Labs: !!!!!!!!!!!!!!!!!!!!!!!!!!!!!!!!!!!!!!!!!!!! @LABRCNTIP (procalcitonin:4,lacticidven:4) )No results found for this or any previous visit (from the past 240 hour(s)).   Radiological Exams on Admission: Dg Chest Port 1 View  Result Date: 08/25/2017 CLINICAL DATA:  Increasing shortness of breath since 08/23/2017. EXAM: PORTABLE CHEST 1 VIEW COMPARISON:  PA and lateral chest 08/23/2017 and 10/21/2016. FINDINGS: Mild subsegmental atelectasis. There is some peribronchial thickening. No consolidative process is seen lung bases, pneumothorax or effusion. Heart size is normal. No acute or focal bony abnormality. IMPRESSION: Mild bronchitic change without consolidative process. Electronically Signed   By: Inge Rise M.D.   On: 08/25/2017 19:51    EKG: Independently reviewed.   Normal sinus rhythm no acute changes Old chart reviewed Case discussed with Dr. Lacinda Axon in the ED Chest x-ray reviewed no edema or infiltrate  Assessment/Plan 32 year old female comes in with wheezing bronchospasm tachypnea requiring BiPAP  with no prior diagnosis of asthma or COPD pack a day smoker Principal Problem:   Bronchospasm, acute-patient reports that she has had wheezing episodes almost yearly for the past 5 years but no more frequent than that.  Will place in stepdown unit and continue BiPAP for the next couple of hours.  She was placed on BiPAP for her tachypnea.  Her lungs sound pretty clear at this time we will continue azithromycin.  Placed on IV Solu-Medrol.  Place her on frequent nebs.  Of asked respiratory therapy to do an hour-long albuterol neb at this time.  Her d-dimer is negative she has no risk factors for VTE so no further work-up at this time.  Symptoms persist and patient does not improve with the above treatment would consider CTA in the morning if not off BiPAP and improved by that time.  Active Problems:   SOB (shortness of breath)-secondary to the above    Acute respiratory failure (HCC)-with tachypnea requiring BiPAP wean BiPAP as needed and treat bronchospasm    Diabetes mellitus type 2 in obese (HCC)-likely more uncontrolled recently due to prednisone usage.  Placed on sliding scale insulin.  Check hemoglobin A1c.    Morbid obesity (HCC)-noted    Polycystic ovary disease-noted    Tobacco abuse-patient encouraged to stop and educated that this is likely the beginning of undiagnosed COPD she understands this.   Patient is an emergency room nurse here at [facility]  DVT prophylaxis: Lovenox Code Status: Full Family Communication: Grandmother Disposition Plan: Days Consults called: None Admission status: Admission   Kerman Pfost A MD [facility]  If 7PM-7AM, please contact night-coverage [email or web address] Password [facility]  08/25/2017, 9:54 PM

## 2017-08-25 NOTE — ED Notes (Signed)
Radiology at bedside

## 2017-08-26 ENCOUNTER — Inpatient Hospital Stay (HOSPITAL_COMMUNITY): Payer: 59

## 2017-08-26 DIAGNOSIS — J9601 Acute respiratory failure with hypoxia: Secondary | ICD-10-CM

## 2017-08-26 DIAGNOSIS — E669 Obesity, unspecified: Secondary | ICD-10-CM

## 2017-08-26 DIAGNOSIS — E282 Polycystic ovarian syndrome: Secondary | ICD-10-CM

## 2017-08-26 DIAGNOSIS — E1169 Type 2 diabetes mellitus with other specified complication: Secondary | ICD-10-CM

## 2017-08-26 LAB — BASIC METABOLIC PANEL
Anion gap: 12 (ref 5–15)
BUN: 11 mg/dL (ref 6–20)
CALCIUM: 8.5 mg/dL — AB (ref 8.9–10.3)
CO2: 20 mmol/L — AB (ref 22–32)
CREATININE: 0.74 mg/dL (ref 0.44–1.00)
Chloride: 104 mmol/L (ref 98–111)
GLUCOSE: 275 mg/dL — AB (ref 70–99)
Potassium: 4.2 mmol/L (ref 3.5–5.1)
Sodium: 136 mmol/L (ref 135–145)

## 2017-08-26 LAB — GLUCOSE, CAPILLARY
GLUCOSE-CAPILLARY: 301 mg/dL — AB (ref 70–99)
Glucose-Capillary: 237 mg/dL — ABNORMAL HIGH (ref 70–99)
Glucose-Capillary: 205 mg/dL — ABNORMAL HIGH (ref 70–99)
Glucose-Capillary: 177 mg/dL — ABNORMAL HIGH (ref 70–99)
Glucose-Capillary: 280 mg/dL — ABNORMAL HIGH (ref 70–99)

## 2017-08-26 LAB — HEMOGLOBIN A1C
Hgb A1c MFr Bld: 7.1 % — ABNORMAL HIGH (ref 4.8–5.6)
MEAN PLASMA GLUCOSE: 157.07 mg/dL

## 2017-08-26 LAB — CBC
HCT: 45.2 % (ref 36.0–46.0)
Hemoglobin: 15.1 g/dL — ABNORMAL HIGH (ref 12.0–15.0)
MCH: 31 pg (ref 26.0–34.0)
MCHC: 33.4 g/dL (ref 30.0–36.0)
MCV: 92.8 fL (ref 78.0–100.0)
PLATELETS: 285 10*3/uL (ref 150–400)
RBC: 4.87 MIL/uL (ref 3.87–5.11)
RDW: 13.9 % (ref 11.5–15.5)
WBC: 23 10*3/uL — ABNORMAL HIGH (ref 4.0–10.5)

## 2017-08-26 LAB — MRSA PCR SCREENING: MRSA BY PCR: NEGATIVE

## 2017-08-26 MED ORDER — SODIUM CHLORIDE 0.9 % IV SOLN
1.0000 g | INTRAVENOUS | Status: DC
  Administered 2017-08-26 – 2017-08-30 (×5): 1 g via INTRAVENOUS
  Filled 2017-08-26 (×3): qty 10
  Filled 2017-08-26 (×3): qty 1

## 2017-08-26 MED ORDER — INSULIN ASPART 100 UNIT/ML ~~LOC~~ SOLN
0.0000 [IU] | Freq: Three times a day (TID) | SUBCUTANEOUS | Status: DC
  Administered 2017-08-27: 2 [IU] via SUBCUTANEOUS
  Administered 2017-08-27: 3 [IU] via SUBCUTANEOUS
  Administered 2017-08-27: 2 [IU] via SUBCUTANEOUS
  Administered 2017-08-28: 7 [IU] via SUBCUTANEOUS
  Administered 2017-08-28: 2 [IU] via SUBCUTANEOUS
  Administered 2017-08-28: 1 [IU] via SUBCUTANEOUS
  Administered 2017-08-29: 5 [IU] via SUBCUTANEOUS
  Administered 2017-08-29: 9 [IU] via SUBCUTANEOUS
  Administered 2017-08-30: 5 [IU] via SUBCUTANEOUS
  Administered 2017-08-30: 7 [IU] via SUBCUTANEOUS
  Administered 2017-08-30: 3 [IU] via SUBCUTANEOUS
  Administered 2017-08-31: 7 [IU] via SUBCUTANEOUS
  Administered 2017-08-31: 3 [IU] via SUBCUTANEOUS

## 2017-08-26 MED ORDER — METHYLPREDNISOLONE SODIUM SUCC 40 MG IJ SOLR
40.0000 mg | Freq: Two times a day (BID) | INTRAMUSCULAR | Status: DC
  Administered 2017-08-26 – 2017-08-27 (×2): 40 mg via INTRAVENOUS
  Filled 2017-08-26 (×2): qty 1

## 2017-08-26 MED ORDER — IOPAMIDOL (ISOVUE-370) INJECTION 76%
100.0000 mL | Freq: Once | INTRAVENOUS | Status: AC | PRN
  Administered 2017-08-26: 100 mL via INTRAVENOUS

## 2017-08-26 MED ORDER — IPRATROPIUM-ALBUTEROL 0.5-2.5 (3) MG/3ML IN SOLN
3.0000 mL | Freq: Four times a day (QID) | RESPIRATORY_TRACT | Status: DC
  Administered 2017-08-26 – 2017-08-31 (×19): 3 mL via RESPIRATORY_TRACT
  Filled 2017-08-26 (×20): qty 3

## 2017-08-26 NOTE — Progress Notes (Signed)
OT Cancellation Note  Patient Details Name: Aris LotKristy A Newland MRN: [id number] DOB: 06/02/1985   Cancelled Treatment:    Reason Eval/Treat Not Completed: OT screened, no needs identified, will sign off. Observed PT session and screened pt for OT needs. PTA pt independent with all ADLs and mobility, community ambulator, drives. At this time pt's primary deficit is SOB limiting ADLs. No further OT services required at this time. Thank you for this referral.   Ezra SitesLeslie Kari Kerth, OTR/L  [phone] 08/26/2017, 9:05 AM

## 2017-08-26 NOTE — Evaluation (Signed)
Physical Therapy Evaluation Patient Details Name: Cheryl Black MRN: [id number] DOB: 02/26/1985 Today's Date: 08/26/2017   History of Present Illness  Cheryl LotKristy A Greenstein is a [age] y.o. female with medical history significant of diabetes, PCOS, obesity, pack a day smoker with no prior history of asthma or COPD comes in with several days of shortness of breath and wheezing at home.  Patient was seen here in the ED on August 18 and given oral steroids and albuterol to take at home.  She did this around the clock was not getting any better she saw her primary care physician yesterday who added on azithromycin orally and Spiriva inhaler.  Patient reports she continued to still not get better.  She denies any fevers but says that she is having a spasmy cough.  She denies any lower extremity swelling or edema.  She has no history of VT and has not really had any recent traveling.  She is not on any estrogen medications.  She does not have chest pain but reports some right sided posterior pain when taking a deep breath.  Patient is being referred for admission for bronchospasm.  She is been on BiPAP for the last couple of hours and she feels like this is helping her.  Per respiratory therapy she is wheezing less.  Patient be referred for admission for acute respiratory failure requiring BiPAP support.    Clinical Impression  Patient demonstrates slightly labored movement for sitting up at bedside, limited to standing marching in place at bedside due to on Bi-pap, tolerated up to 3-4 minutes before having to stop due to fatigue/SOB.  Patient demonstrates good return for getting back into bed and repositioning self.  Patient will benefit from continued physical therapy in hospital and recommended venue below to increase strength, balance, endurance for safe ADLs and gait.    Follow Up Recommendations Home health PT;Supervision - Intermittent    Equipment Recommendations  None recommended by PT    Recommendations  for Other Services       Precautions / Restrictions Precautions Precautions: None Restrictions Weight Bearing Restrictions: No      Mobility  Bed Mobility Overal bed mobility: Modified Independent                Transfers Overall transfer level: Modified independent Equipment used: None             General transfer comment: increased time  Ambulation/Gait Ambulation/Gait assistance: Min guard Gait Distance (Feet): 10 Feet Assistive device: 1 person hand held assist;None   Gait velocity: slow   General Gait Details: limited to marching in place for approximately 3-4 minutes before having to stop due to SOB while on Bi-pap  Stairs            Wheelchair Mobility    Modified Rankin (Stroke Patients Only)       Balance Overall balance assessment: Mild deficits observed, not formally tested                                           Pertinent Vitals/Pain Pain Assessment: 0-10 Pain Score: 6  Pain Location: chest area, chronic low back pain Pain Descriptors / Indicators: Aching;Sore Pain Intervention(s): Limited activity within patient's tolerance;Monitored during session    Home Living Family/patient expects to be discharged to:: Private residence Living Arrangements: Spouse/significant other Available Help at Discharge: Family Type of Home: House  Home Access: Stairs to enter Entrance Stairs-Rails: None Entrance Stairs-Number of Steps: 3 Home Layout: Two level Home Equipment: None      Prior Function Level of Independence: Independent         Comments: community ambulator, drives     Higher education careers adviserHand Dominance        Extremity/Trunk Assessment   Upper Extremity Assessment Upper Extremity Assessment: Generalized weakness    Lower Extremity Assessment Lower Extremity Assessment: Generalized weakness    Cervical / Trunk Assessment Cervical / Trunk Assessment: Normal  Communication   Communication: No difficulties   Cognition Arousal/Alertness: Awake/alert Behavior During Therapy: WFL for tasks assessed/performed Overall Cognitive Status: Within Functional Limits for tasks assessed                                        General Comments      Exercises     Assessment/Plan    PT Assessment Patient needs continued PT services  PT Problem List Decreased strength;Decreased activity tolerance;Decreased balance;Decreased mobility       PT Treatment Interventions Gait training;Stair training;Functional mobility training;Therapeutic activities;Therapeutic exercise;Patient/family education    PT Goals (Current goals can be found in the Care Plan section)  Acute Rehab PT Goals Patient Stated Goal: return home PT Goal Formulation: With patient/family Time For Goal Achievement: 09/02/17 Potential to Achieve Goals: Good    Frequency Min 3X/week   Barriers to discharge        Co-evaluation               AM-PAC PT "6 Clicks" Daily Activity  Outcome Measure Difficulty turning over in bed (including adjusting bedclothes, sheets and blankets)?: None Difficulty moving from lying on back to sitting on the side of the bed? : None Difficulty sitting down on and standing up from a chair with arms (e.g., wheelchair, bedside commode, etc,.)?: None Help needed moving to and from a bed to chair (including a wheelchair)?: A Little Help needed walking in hospital room?: A Little Help needed climbing 3-5 steps with a railing? : A Little 6 Click Score: 21    End of Session Equipment Utilized During Treatment: Oxygen;Other (comment)(on bi-pap) Activity Tolerance: Patient tolerated treatment well;Patient limited by fatigue Patient left: in bed;with call bell/phone within reach;with family/visitor present Nurse Communication: Mobility status PT Visit Diagnosis: Unsteadiness on feet (R26.81);Other abnormalities of gait and mobility (R26.89);Muscle weakness (generalized) (M62.81)     Time: [id number] PT Time Calculation (min) (ACUTE ONLY): 20 min   Charges:   PT Evaluation $PT Eval Moderate Complexity: 1 Mod PT Treatments $Therapeutic Activity: 23-37 mins        2:12 PM, 08/26/17 Ocie BobJames Selyna Klahn, MPT Physical Therapist with [facility] [phone] office [phone] mobile phone

## 2017-08-26 NOTE — Progress Notes (Signed)
PROGRESS NOTE    Cheryl Black  XVQ:[id number] DOB: 03-16-1985 DOA: 08/25/2017 PCP: [name], MD     Brief Narrative:  32 year old woman admitted from home on 8/20 due to shortness of breath.  She has a history significant for type 2 diabetes, PCOS, morbid obesity and tobacco abuse.  She was seen in the ED on August 18 and was given albuterol and prednisone.  Because of lack of improvement she then saw her PCP yesterday who added Spiriva and oral azithromycin.  Due to continued lack of improvement she came into the ED on day of admission.  She did require BiPAP upon arrival to the ED and admission was requested.   Assessment & Plan:   Principal Problem:   Bronchospasm, acute Active Problems:   Diabetes mellitus type 2 in obese (HCC)   Morbid obesity (HCC)   Polycystic ovary disease   Tobacco abuse   SOB (shortness of breath)   Acute respiratory failure (HCC)   Acute hypoxemic respiratory failure -She has required BiPAP intermittently today. -Because of lack of concrete evidence for pneumonia on chest x-ray and lack of concerning signs on chest auscultation, I have decided to proceed with a CT angiogram today to rule out PE. -CT shows no evidence of PE, however she does have significant bilateral lower lobe atelectasis and right middle lobe atelectasis, radiologist is also concerned about a component of superimposed pneumonia.  There is no evidence of pulmonary edema. -We will add Rocephin to azithromycin to fully cover community-acquired pneumonia. -Have also added incentive spirometry to be used every hour while awake. -I do believe that there is a large component of anxiety as she does not have any concrete evidence as to why she might need BiPAP.  We will continue to discuss and wean accordingly. -Breathing treatments as needed.  Start weaning steroids due to lack of wheezing on auscultation.. -She does not have a history of asthma or COPD. -Given long-standing tobacco  abuse, would benefit from PFTs post discharge.  Tobacco abuse -I have discussed tobacco cessation with the patient.  I have counseled the patient regarding the negative impacts of continued tobacco use including but not limited to lung cancer, COPD, and cardiovascular disease.  I have discussed alternatives to tobacco and modalities that may help facilitate tobacco cessation including but not limited to biofeedback, hypnosis, and medications.  Total time spent with tobacco counseling was 4 minutes.  Morbid obesity -Noted, counseled on lifestyle modifications.  Type 2 diabetes -CBGs have been elevated, likely due to steroid use. -A1c is elevated at 7.1. -Continue sliding scale. -Should improve now that we are weaning steroids.    DVT prophylaxis: Lovenox Code Status: Full code Family Communication: Husband and mother at bedside updated on plan of care and all questions answered Disposition Plan: Keep in ICU tonight in case needs BiPAP, would anticipate discharge home over next 48 to 72 hours  Consultants:   None  Procedures:   None  Antimicrobials:  Anti-infectives (From admission, onward)   Start     Dose/Rate Route Frequency Ordered Stop   08/26/17 2000  cefTRIAXone (ROCEPHIN) 1 g in sodium chloride 0.9 % 100 mL IVPB     1 g 200 mL/hr over 30 Minutes Intravenous Every 24 hours 08/26/17 1700     08/25/17 2000  cefTRIAXone (ROCEPHIN) 1 g in sodium chloride 0.9 % 100 mL IVPB     1 g 200 mL/hr over 30 Minutes Intravenous  Once 08/25/17 1945 08/25/17 2040  08/25/17 2000  azithromycin (ZITHROMAX) 500 mg in sodium chloride 0.9 % 250 mL IVPB     500 mg 250 mL/hr over 60 Minutes Intravenous Every 24 hours 08/25/17 1945         Subjective: In bed, when I initially saw her she had BiPAP on, had subsequently remove the BiPAP and she remains without distress and maintaining oxygen saturations in the low to mid 90s, she complains of intermittent chest pain or shortness of breath,  feels "jittery" after albuterol use.  Objective: Vitals:   08/26/17 1200 08/26/17 1300 08/26/17 1400 08/26/17 1434  BP:      Pulse: 68 79 73   Resp: (!) 24 (!) 22 (!) 26   Temp:    98.8 F (37.1 C)  TempSrc:    Axillary  SpO2: 91% 90% (!) 89%   Weight:    132.4 kg  Height:    5' 3"  (1.6 m)    Intake/Output Summary (Last 24 hours) at 08/26/2017 1830 Last data filed at 08/25/2017 2201 Gross per 24 hour  Intake 900 ml  Output -  Net 900 ml   Filed Weights   08/25/17 1921 08/25/17 2303 08/26/17 1434  Weight: 130.6 kg (!) 137.8 kg 132.4 kg    Examination:  General exam: Alert, awake, oriented x 3, morbidly obese Respiratory system: Clear to auscultation. Respiratory effort normal. Cardiovascular system:RRR. No murmurs, rubs, gallops. Gastrointestinal system: Abdomen is nondistended, soft and nontender. No organomegaly or masses felt. Normal bowel sounds heard. Central nervous system: Alert and oriented. No focal neurological deficits. Extremities: No C/C/E, +pedal pulses Skin: No rashes, lesions or ulcers Psychiatry: Judgement and insight appear normal. Mood & affect appropriate.     Data Reviewed: I have personally reviewed following labs and imaging studies  CBC: Recent Labs  Lab 08/23/17 1647 08/25/17 1928 08/26/17 0406  WBC 11.3* 14.7* 23.0*  NEUTROABS 6.9 12.9  --   HGB 16.1* 15.7* 15.1*  HCT 46.6* 46.5* 45.2  MCV 90.5 91.2 92.8  PLT 259 248 320   Basic Metabolic Panel: Recent Labs  Lab 08/23/17 1647 08/25/17 2023 08/26/17 0406  NA 137 133* 136  K 3.7 3.9 4.2  CL 105 102 104  CO2 24 16* 20*  GLUCOSE 149* 386* 275*  BUN 9 13 11   CREATININE 0.69 0.86 0.74  CALCIUM 8.8* 8.5* 8.5*   GFR: Estimated Creatinine Clearance: 133.3 mL/min (by C-G formula based on SCr of 0.74 mg/dL). Liver Function Tests: No results for input(s): AST, ALT, ALKPHOS, BILITOT, PROT, ALBUMIN in the last 168 hours. No results for input(s): LIPASE, AMYLASE in the last 168  hours. No results for input(s): AMMONIA in the last 168 hours. Coagulation Profile: No results for input(s): INR, PROTIME in the last 168 hours. Cardiac Enzymes: Recent Labs  Lab 08/25/17 1928  TROPONINI <0.03   BNP (last 3 results) No results for input(s): PROBNP in the last 8760 hours. HbA1C: Recent Labs    08/25/17 2023  HGBA1C 7.1*   CBG: Recent Labs  Lab 08/25/17 2331 08/26/17 0401 08/26/17 0751 08/26/17 1103 08/26/17 1721  GLUCAP 330* 301* 237* 205* 177*   Lipid Profile: No results for input(s): CHOL, HDL, LDLCALC, TRIG, CHOLHDL, LDLDIRECT in the last 72 hours. Thyroid Function Tests: No results for input(s): TSH, T4TOTAL, FREET4, T3FREE, THYROIDAB in the last 72 hours. Anemia Panel: No results for input(s): VITAMINB12, FOLATE, FERRITIN, TIBC, IRON, RETICCTPCT in the last 72 hours. Urine analysis:    Component Value Date/Time   COLORURINE YELLOW  08/17/2012 [place] 08/17/2012 0339   LABSPEC 1.020 08/17/2012 0339   PHURINE 5.5 08/17/2012 0339   GLUCOSEU >1000 (A) 08/17/2012 0339   HGBUR TRACE (A) 08/17/2012 0339   BILIRUBINUR NEGATIVE 08/17/2012 0339   KETONESUR NEGATIVE 08/17/2012 0339   PROTEINUR NEGATIVE 08/17/2012 0339   UROBILINOGEN 0.2 08/17/2012 0339   NITRITE NEGATIVE 08/17/2012 0339   LEUKOCYTESUR NEGATIVE 08/17/2012 0339   Sepsis Labs: @LABRCNTIP (procalcitonin:4,lacticidven:4)  ) Recent Results (from the past 240 hour(s))  MRSA PCR Screening     Status: None   Collection Time: 08/25/17 10:50 PM  Result Value Ref Range Status   MRSA by PCR NEGATIVE NEGATIVE Final    Comment:        The GeneXpert MRSA Assay (FDA approved for NASAL specimens only), is one component of a comprehensive MRSA colonization surveillance program. It is not intended to diagnose MRSA infection nor to guide or monitor treatment for MRSA infections. Performed at [facility], [address], [place], [place] [address]          Radiology  Studies: Ct Angio Chest Pe W Or Wo Contrast  Result Date: 08/26/2017 CLINICAL DATA:  Central chest lpain since x 3 days. Radiating into RUQ of abdomen/lower lung. Hx of dm, pcos,ks, fatty liver, cholecystectomyWorsening of symptoms in last 2 hrs. EXAM: CT ANGIOGRAPHY CHEST WITH CONTRAST TECHNIQUE: Multidetector CT imaging of the chest was performed using the standard protocol during bolus administration of intravenous contrast. Multiplanar CT image reconstructions and MIPs were obtained to evaluate the vascular anatomy. CONTRAST:  135m ISOVUE-370 IOPAMIDOL (ISOVUE-370) INJECTION 76% COMPARISON:  Chest radiograph, 08/25/2017. FINDINGS: Cardiovascular: There is satisfactory opacification of the pulmonary arteries to the segmental level. There is no evidence of a pulmonary embolus. Heart is normal in size and configuration. No pericardial effusion. No coronary artery calcifications. Great vessels are normal in caliber. No aortic dissection or atherosclerosis. Mediastinum/Nodes: No enlarged mediastinal, hilar, or axillary lymph nodes. Thyroid gland, trachea, and esophagus demonstrate no significant findings. Lungs/Pleura: Study appears expiratory. There is significant atelectasis both lower lobes. Milder atelectasis is noted at the base of the right middle lobe. Remainder of the lungs is clear. No pleural effusion. No pneumothorax. Upper Abdomen: No acute abnormality. Musculoskeletal: No chest wall abnormality. No acute or significant osseous findings. Review of the MIP images confirms the above findings. IMPRESSION: 1. No evidence of a pulmonary embolism. 2. There is significant lower lobe atelectasis bilaterally. There is milder right middle lobe atelectasis. Consider a component of superimposed pneumonia if there are consistent clinical findings. No evidence of pulmonary edema. Electronically Signed   By: DLajean ManesM.D.   On: 08/26/2017 16:38   Dg Chest Port 1 View  Result Date: 08/25/2017 CLINICAL DATA:   Increasing shortness of breath since 08/23/2017. EXAM: PORTABLE CHEST 1 VIEW COMPARISON:  PA and lateral chest 08/23/2017 and 10/21/2016. FINDINGS: Mild subsegmental atelectasis. There is some peribronchial thickening. No consolidative process is seen lung bases, pneumothorax or effusion. Heart size is normal. No acute or focal bony abnormality. IMPRESSION: Mild bronchitic change without consolidative process. Electronically Signed   By: TInge RiseM.D.   On: 08/25/2017 19:51        Scheduled Meds: . enoxaparin (LOVENOX) injection  40 mg Subcutaneous Q24H  . insulin aspart  0-9 Units Subcutaneous Q4H  . ipratropium  0.5 mg Nebulization Q6H  . methylPREDNISolone (SOLU-MEDROL) injection  80 mg Intravenous Q12H  . nicotine  14 mg Transdermal Daily  . sodium chloride flush  3 mL Intravenous Q12H   Continuous Infusions: . sodium chloride    . azithromycin Stopped (08/25/17 2201)  . cefTRIAXone (ROCEPHIN)  IV       LOS: 1 day    The patient is critically ill with respiratory system failure and requires high complexity decision making for assessment and support, frequent evaluation and titration of therapies, application of advanced monitoring technologies and extensive interpretation of multiple databases.  Critical care time - 45 mins.       Lelon Frohlich, MD [facility] Pager [phone]  If 7PM-7AM, please contact night-coverage [email or web address] Password [email or web address] 08/26/2017, 6:30 PM   \

## 2017-08-26 NOTE — Care Management Note (Signed)
Case Management Note  Patient Details  Name: Cheryl Black MRN: [id number] Date of Birth: 04/05/1985  Subjective/Objective:   CM consulted for COPD. Chart reviewed. Patient is independent. 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 Seen by PT today, PT verbalized to CM [facility]. Anticipate as patient progresses that she will not need HH PT.                  Action/Plan: CM following.   Expected Discharge Date:     unknown           Expected Discharge Plan:  Home/Self Care  In-House Referral:     Discharge planning Services  CM Consult  Post Acute Care Choice:  NA Choice offered to:  NA  DME Arranged:    DME Agency:     HH Arranged:    HH Agency:     Status of Service:  Completed, signed off  If discussed at [employer] Length of Stay Meetings, dates discussed:    Additional Comments:  Gabreal Worton, Chrystine OilerSharley Diane, RN 08/26/2017, 1:32 PM

## 2017-08-26 NOTE — Progress Notes (Signed)
Inpatient Diabetes Program Recommendations  AACE/ADA: New Consensus Statement on Inpatient Glycemic Control (2015)  Target Ranges:  Prepandial:   less than 140 mg/dL      Peak postprandial:   less than 180 mg/dL (1-2 hours)      Critically ill patients:  140 - 180 mg/dL   Lab Results  Component Value Date   GLUCAP 237 (H) 08/26/2017   HGBA1C 6.3 (H) 01/08/2011    Review of Glycemic Control  Diabetes history: DM2 Outpatient Diabetes medications: Kombiglyze (Metformin + Saxagliptin) Current orders for Inpatient glycemic control: Novolog sensitive correction q 4 hrs + steroids  Inpatient Diabetes Program Recommendations:   Awaiting A1c result. While in the hospital on steroids: -Lantus 20 units daily (0.2 x 137 kg would equal 27 units) -Increase Novolog correction to moderate q 4 hrs.  Thank you, Billy FischerJudy E. Ronit Marczak, RN, MSN, CDE  Diabetes Coordinator Inpatient Glycemic Control Team Team Pager [phone] (8am-5pm) 08/26/2017 8:22 AM

## 2017-08-26 NOTE — Plan of Care (Signed)
  Problem: Acute Rehab PT Goals(only PT should resolve) Goal: Pt Will Transfer Bed To Chair/Chair To Bed Outcome: Progressing Flowsheets (Taken 08/26/2017 1413) Pt will Transfer Bed to Chair/Chair to Bed: Independently Goal: Pt Will Ambulate Outcome: Progressing Flowsheets (Taken 08/26/2017 1413) Pt will Ambulate: > 125 feet; Independently   2:14 PM, 08/26/17 Ocie BobJames Tanzie Rothschild, MPT Physical Therapist with [facility] 336 [phone] office [phone] mobile phone

## 2017-08-26 NOTE — Progress Notes (Signed)
Nutrition Brief Note  Patient screened as part of the COPD protocol.  Wt Readings from Last 15 Encounters:  08/25/17 (!) 137.8 kg  08/23/17 130.6 kg  10/21/16 130.6 kg  03/09/16 (!) 137.4 kg  03/03/16 (!) 137.4 kg  02/09/15 (!) 138.3 kg  06/19/12 (!) 145.9 kg  06/14/12 (!) 137.9 kg  01/31/12 (!) 137.4 kg  12/28/11 (!) 137.9 kg  01/10/11 133.1 kg  10/13/10 130.2 kg   Body mass index is 44.86 kg/m. Patient meets criteria for Morbidly obese based on current BMI.    Patient still with significant dyspnea. On Bipap. She says her breathing is "rough" this morning.   She says PTA her appetite had been unaffected by her COPD and her severe shortness of breath was an acute occurrence. Reports strong appetite. The only supplement she took at home was a calcium supplement. She notes she does not eat tomatoes.   Because there were other individuals in room, RD only generally asked if she has had any unintentional weight changes, which she denied. Per chart, her weigh has been 290-305 lbs for quite many years.   At this time, patient remains NPO d/t Bipap. Will monitor intake once off.   No nutrition interventions warranted at this time. If nutrition issues arise, please consult RD.  Burtis Junes RD, LDN, CNSC Clinical Nutrition Available Tues-Sat via Pager: [phone] 08/26/2017 11:04 AM

## 2017-08-27 LAB — CBC
HCT: 45.4 % (ref 36.0–46.0)
Hemoglobin: 15 g/dL (ref 12.0–15.0)
MCH: 30.4 pg (ref 26.0–34.0)
MCHC: 33 g/dL (ref 30.0–36.0)
MCV: 92.1 fL (ref 78.0–100.0)
Platelets: 300 10*3/uL (ref 150–400)
RBC: 4.93 MIL/uL (ref 3.87–5.11)
RDW: 14.1 % (ref 11.5–15.5)
WBC: 23.8 10*3/uL — ABNORMAL HIGH (ref 4.0–10.5)

## 2017-08-27 LAB — GLUCOSE, CAPILLARY
GLUCOSE-CAPILLARY: 321 mg/dL — AB (ref 70–99)
Glucose-Capillary: 199 mg/dL — ABNORMAL HIGH (ref 70–99)
Glucose-Capillary: 200 mg/dL — ABNORMAL HIGH (ref 70–99)
Glucose-Capillary: 240 mg/dL — ABNORMAL HIGH (ref 70–99)
Glucose-Capillary: 242 mg/dL — ABNORMAL HIGH (ref 70–99)
Glucose-Capillary: 246 mg/dL — ABNORMAL HIGH (ref 70–99)

## 2017-08-27 MED ORDER — INSULIN ASPART 100 UNIT/ML ~~LOC~~ SOLN
5.0000 [IU] | Freq: Once | SUBCUTANEOUS | Status: AC
  Administered 2017-08-27: 5 [IU] via SUBCUTANEOUS

## 2017-08-27 MED ORDER — METHYLPREDNISOLONE SODIUM SUCC 40 MG IJ SOLR
40.0000 mg | INTRAMUSCULAR | Status: DC
  Administered 2017-08-28 – 2017-08-31 (×4): 40 mg via INTRAVENOUS
  Filled 2017-08-27 (×4): qty 1

## 2017-08-27 NOTE — Progress Notes (Signed)
PROGRESS NOTE    Cheryl Black  KPV:[id number] DOB: [birth year] DOA: 08/25/2017 PCP: [name], MD     Brief Narrative:  32 year old woman admitted from home on 8/20 due to shortness of breath.  She has a history significant for type 2 diabetes, PCOS, morbid obesity and tobacco abuse.  She was seen in the ED on August 18 and was given albuterol and prednisone.  Because of lack of improvement she then saw her PCP yesterday who added Spiriva and oral azithromycin.  Due to continued lack of improvement she came into the ED on day of admission.  She did require BiPAP upon arrival to the ED and admission was requested.   Assessment & Plan:   Principal Problem:   Bronchospasm, acute Active Problems:   Diabetes mellitus type 2 in obese (HCC)   Morbid obesity (HCC)   Polycystic ovary disease   Tobacco abuse   SOB (shortness of breath)   Acute respiratory failure (HCC)   Acute hypoxemic respiratory failure -She has not required BIPAP past 18 hours. -CT shows no evidence of PE, however she does have significant bilateral lower lobe atelectasis and right middle lobe atelectasis, radiologist is also concerned about a component of superimposed pneumonia.  There is no evidence of pulmonary edema. -No significant wheezing on exam. -Continue rocephin and azithro to cover community-acquired pneumonia. -Continue incentive spirometer. -Breathing treatments as needed.  Continue weaning steroids due to lack of wheezing on auscultation.. -She does not have a history of asthma or COPD. -Given long-standing tobacco abuse, would benefit from PFTs post discharge. -OK to transfer to floor today.  Tobacco abuse -Tobacco cessation was discussed with the patient on 8/21.  Morbid obesity -Noted, counseled on lifestyle modifications.  Type 2 diabetes -CBGs have been elevated, likely due to steroid use, but improving with steroid titration. -A1c is elevated at 7.1. -Continue sliding scale.    DVT  prophylaxis: Lovenox Code Status: Full code Family Communication: Husband at bedside updated on plan of care and all questions answered Disposition Plan: Transfer to floor, continue to wean oxygen.  Consultants:   None  Procedures:   None  Antimicrobials:  Anti-infectives (From admission, onward)   Start     Dose/Rate Route Frequency Ordered Stop   08/26/17 2000  cefTRIAXone (ROCEPHIN) 1 g in sodium chloride 0.9 % 100 mL IVPB     1 g 200 mL/hr over 30 Minutes Intravenous Every 24 hours 08/26/17 1700     08/25/17 2000  cefTRIAXone (ROCEPHIN) 1 g in sodium chloride 0.9 % 100 mL IVPB     1 g 200 mL/hr over 30 Minutes Intravenous  Once 08/25/17 1945 08/25/17 2040   08/25/17 2000  azithromycin (ZITHROMAX) 500 mg in sodium chloride 0.9 % 250 mL IVPB     500 mg 250 mL/hr over 60 Minutes Intravenous Every 24 hours 08/25/17 1945         Subjective: In bed, feels better. Still SOB especially with ambulation.  Objective: Vitals:   08/27/17 0900 08/27/17 1000 08/27/17 1125 08/27/17 1440  BP: 116/74 113/72    Pulse: 72 62 70   Resp: (!) 23 (!) 22 15   Temp:   98 F (36.7 C)   TempSrc:   Oral   SpO2: 92% 94% 93% 95%  Weight:      Height:        Intake/Output Summary (Last 24 hours) at 08/27/2017 1744 Last data filed at 08/27/2017 0954 Gross per 24 hour  Intake 55.5 ml  Output -  Net 55.5 ml   Filed Weights   08/25/17 2303 08/26/17 1434 08/27/17 0406  Weight: (!) 137.8 kg 132.4 kg (!) 138.9 kg    Examination:  General exam: Alert, awake, oriented x 3 Respiratory system: Clear to auscultation. Respiratory effort normal. Cardiovascular system:RRR. No murmurs, rubs, gallops. Gastrointestinal system: Abdomen is nondistended, obese, soft and nontender. No organomegaly or masses felt. Normal bowel sounds heard. Central nervous system: Alert and oriented. No focal neurological deficits. Extremities: No C/C/E, +pedal pulses Skin: No rashes, lesions or ulcers Psychiatry:  Judgement and insight appear normal. Mood & affect appropriate.      Data Reviewed: I have personally reviewed following labs and imaging studies  CBC: Recent Labs  Lab 08/23/17 1647 08/25/17 1928 08/26/17 0406 08/27/17 0357  WBC 11.3* 14.7* 23.0* 23.8*  NEUTROABS 6.9 12.9  --   --   HGB 16.1* 15.7* 15.1* 15.0  HCT 46.6* 46.5* 45.2 45.4  MCV 90.5 91.2 92.8 92.1  PLT 259 248 285 836   Basic Metabolic Panel: Recent Labs  Lab 08/23/17 1647 08/25/17 2023 08/26/17 0406  NA 137 133* 136  K 3.7 3.9 4.2  CL 105 102 104  CO2 24 16* 20*  GLUCOSE 149* 386* 275*  BUN 9 13 11   CREATININE 0.69 0.86 0.74  CALCIUM 8.8* 8.5* 8.5*   GFR: Estimated Creatinine Clearance: 137.4 mL/min (by C-G formula based on SCr of 0.74 mg/dL). Liver Function Tests: No results for input(s): AST, ALT, ALKPHOS, BILITOT, PROT, ALBUMIN in the last 168 hours. No results for input(s): LIPASE, AMYLASE in the last 168 hours. No results for input(s): AMMONIA in the last 168 hours. Coagulation Profile: No results for input(s): INR, PROTIME in the last 168 hours. Cardiac Enzymes: Recent Labs  Lab 08/25/17 1928  TROPONINI <0.03   BNP (last 3 results) No results for input(s): PROBNP in the last 8760 hours. HbA1C: Recent Labs    08/25/17 2023  HGBA1C 7.1*   CBG: Recent Labs  Lab 08/27/17 0009 08/27/17 0407 08/27/17 0733 08/27/17 1117 08/27/17 1644  GLUCAP 246* 240* 199* 200* 242*   Lipid Profile: No results for input(s): CHOL, HDL, LDLCALC, TRIG, CHOLHDL, LDLDIRECT in the last 72 hours. Thyroid Function Tests: No results for input(s): TSH, T4TOTAL, FREET4, T3FREE, THYROIDAB in the last 72 hours. Anemia Panel: No results for input(s): VITAMINB12, FOLATE, FERRITIN, TIBC, IRON, RETICCTPCT in the last 72 hours. Urine analysis:    Component Value Date/Time   COLORURINE YELLOW 08/17/2012 [place] 08/17/2012 0339   LABSPEC 1.020 08/17/2012 0339   PHURINE 5.5 08/17/2012 0339    GLUCOSEU >1000 (A) 08/17/2012 0339   HGBUR TRACE (A) 08/17/2012 0339   BILIRUBINUR NEGATIVE 08/17/2012 0339   KETONESUR NEGATIVE 08/17/2012 0339   PROTEINUR NEGATIVE 08/17/2012 0339   UROBILINOGEN 0.2 08/17/2012 0339   NITRITE NEGATIVE 08/17/2012 0339   LEUKOCYTESUR NEGATIVE 08/17/2012 0339   Sepsis Labs: @LABRCNTIP (procalcitonin:4,lacticidven:4)  ) Recent Results (from the past 240 hour(s))  MRSA PCR Screening     Status: None   Collection Time: 08/25/17 10:50 PM  Result Value Ref Range Status   MRSA by PCR NEGATIVE NEGATIVE Final    Comment:        The GeneXpert MRSA Assay (FDA approved for NASAL specimens only), is one component of a comprehensive MRSA colonization surveillance program. It is not intended to diagnose MRSA infection nor to guide or monitor treatment for MRSA infections. Performed at [facility], [address] [place],  [address]  Radiology Studies: Ct Angio Chest Pe W Or Wo Contrast  Result Date: 08/26/2017 CLINICAL DATA:  Central chest lpain since x 3 days. Radiating into RUQ of abdomen/lower lung. Hx of dm, pcos,ks, fatty liver, cholecystectomyWorsening of symptoms in last 2 hrs. EXAM: CT ANGIOGRAPHY CHEST WITH CONTRAST TECHNIQUE: Multidetector CT imaging of the chest was performed using the standard protocol during bolus administration of intravenous contrast. Multiplanar CT image reconstructions and MIPs were obtained to evaluate the vascular anatomy. CONTRAST:  12m ISOVUE-370 IOPAMIDOL (ISOVUE-370) INJECTION 76% COMPARISON:  Chest radiograph, 08/25/2017. FINDINGS: Cardiovascular: There is satisfactory opacification of the pulmonary arteries to the segmental level. There is no evidence of a pulmonary embolus. Heart is normal in size and configuration. No pericardial effusion. No coronary artery calcifications. Great vessels are normal in caliber. No aortic dissection or atherosclerosis. Mediastinum/Nodes: No enlarged mediastinal, hilar,  or axillary lymph nodes. Thyroid gland, trachea, and esophagus demonstrate no significant findings. Lungs/Pleura: Study appears expiratory. There is significant atelectasis both lower lobes. Milder atelectasis is noted at the base of the right middle lobe. Remainder of the lungs is clear. No pleural effusion. No pneumothorax. Upper Abdomen: No acute abnormality. Musculoskeletal: No chest wall abnormality. No acute or significant osseous findings. Review of the MIP images confirms the above findings. IMPRESSION: 1. No evidence of a pulmonary embolism. 2. There is significant lower lobe atelectasis bilaterally. There is milder right middle lobe atelectasis. Consider a component of superimposed pneumonia if there are consistent clinical findings. No evidence of pulmonary edema. Electronically Signed   By: DLajean ManesM.D.   On: 08/26/2017 16:38   Dg Chest Port 1 View  Result Date: 08/25/2017 CLINICAL DATA:  Increasing shortness of breath since 08/23/2017. EXAM: PORTABLE CHEST 1 VIEW COMPARISON:  PA and lateral chest 08/23/2017 and 10/21/2016. FINDINGS: Mild subsegmental atelectasis. There is some peribronchial thickening. No consolidative process is seen lung bases, pneumothorax or effusion. Heart size is normal. No acute or focal bony abnormality. IMPRESSION: Mild bronchitic change without consolidative process. Electronically Signed   By: TInge RiseM.D.   On: 08/25/2017 19:51        Scheduled Meds: . enoxaparin (LOVENOX) injection  40 mg Subcutaneous Q24H  . insulin aspart  0-9 Units Subcutaneous TID WC  . ipratropium-albuterol  3 mL Nebulization Q6H  . methylPREDNISolone (SOLU-MEDROL) injection  40 mg Intravenous Q12H  . nicotine  14 mg Transdermal Daily  . sodium chloride flush  3 mL Intravenous Q12H   Continuous Infusions: . sodium chloride    . azithromycin Stopped (08/26/17 2148)  . cefTRIAXone (ROCEPHIN)  IV Stopped (08/26/17 2139)     LOS: 2 days    Time spent: 35 minutes.  Greater than 50% of this time was spent in direct contact with the patient and with patient's husband, coordinating care and discussing relevant ongoing clinical issues, including progression of respiratory failure, plan to continue to wean oxygen and transfer the floor for further observation and management.     ELelon Frohlich MD [facility] Pager [phone] If 7PM-7AM, please contact night-coverage [email or web address] Password [facility]8/22/2019, 5:44 PM   \

## 2017-08-27 NOTE — Progress Notes (Signed)
Pt states that she is using her Incentive Spirometry as often as she can. She becomes SOB

## 2017-08-28 ENCOUNTER — Inpatient Hospital Stay (HOSPITAL_COMMUNITY): Payer: 59

## 2017-08-28 DIAGNOSIS — R0602 Shortness of breath: Secondary | ICD-10-CM

## 2017-08-28 DIAGNOSIS — I361 Nonrheumatic tricuspid (valve) insufficiency: Secondary | ICD-10-CM

## 2017-08-28 LAB — GLUCOSE, CAPILLARY
GLUCOSE-CAPILLARY: 135 mg/dL — AB (ref 70–99)
GLUCOSE-CAPILLARY: 183 mg/dL — AB (ref 70–99)
GLUCOSE-CAPILLARY: 307 mg/dL — AB (ref 70–99)
Glucose-Capillary: 189 mg/dL — ABNORMAL HIGH (ref 70–99)

## 2017-08-28 LAB — CBC
HEMATOCRIT: 44.2 % (ref 36.0–46.0)
HEMOGLOBIN: 14.2 g/dL (ref 12.0–15.0)
MCH: 30.9 pg (ref 26.0–34.0)
MCHC: 32.1 g/dL (ref 30.0–36.0)
MCV: 96.3 fL (ref 78.0–100.0)
Platelets: 201 10*3/uL (ref 150–400)
RBC: 4.59 MIL/uL (ref 3.87–5.11)
RDW: 14 % (ref 11.5–15.5)
WBC: 14.1 10*3/uL — ABNORMAL HIGH (ref 4.0–10.5)

## 2017-08-28 LAB — BASIC METABOLIC PANEL
ANION GAP: 10 (ref 5–15)
BUN: 17 mg/dL (ref 6–20)
CALCIUM: 8.1 mg/dL — AB (ref 8.9–10.3)
CO2: 23 mmol/L (ref 22–32)
Chloride: 105 mmol/L (ref 98–111)
Creatinine, Ser: 0.63 mg/dL (ref 0.44–1.00)
GLUCOSE: 122 mg/dL — AB (ref 70–99)
POTASSIUM: 3.6 mmol/L (ref 3.5–5.1)
Sodium: 138 mmol/L (ref 135–145)

## 2017-08-28 LAB — ECHOCARDIOGRAM COMPLETE
HEIGHTINCHES: 63 in
WEIGHTICAEL: 4899.5 [oz_av]

## 2017-08-28 MED ORDER — PANTOPRAZOLE SODIUM 40 MG PO TBEC
40.0000 mg | DELAYED_RELEASE_TABLET | Freq: Every day | ORAL | Status: DC
  Administered 2017-08-28 – 2017-08-31 (×4): 40 mg via ORAL
  Filled 2017-08-28 (×4): qty 1

## 2017-08-28 MED ORDER — ACETYLCYSTEINE 20 % IN SOLN
3.0000 mL | Freq: Four times a day (QID) | RESPIRATORY_TRACT | Status: DC
  Administered 2017-08-28 (×2): 3 mL via RESPIRATORY_TRACT
  Administered 2017-08-28: 4 mL via RESPIRATORY_TRACT
  Administered 2017-08-29 – 2017-08-31 (×10): 3 mL via RESPIRATORY_TRACT
  Filled 2017-08-28 (×13): qty 4

## 2017-08-28 NOTE — Progress Notes (Signed)
*  PRELIMINARY RESULTS* Echocardiogram 2D Echocardiogram has been performed.  Jeryl Columbialliott, Javeion Cannedy 08/28/2017, 11:41 AM

## 2017-08-28 NOTE — Progress Notes (Signed)
PROGRESS NOTE    Cheryl Black  XVQ:[id number] DOB: July 01, 1985 DOA: 08/25/2017 PCP: [name], MD     Brief Narrative:  32 year old woman admitted from home on 8/20 due to shortness of breath.  She has a history significant for type 2 diabetes, PCOS, morbid obesity and tobacco abuse.  She was seen in the ED on August 18 and was given albuterol and prednisone.  Because of lack of improvement she then saw her PCP yesterday who added Spiriva and oral azithromycin.  Due to continued lack of improvement she came into the ED on day of admission.  She did require BiPAP upon arrival to the ED and admission was requested.   Assessment & Plan:   Principal Problem:   Bronchospasm, acute Active Problems:   Diabetes mellitus type 2 in obese (HCC)   Morbid obesity (HCC)   Polycystic ovary disease   Tobacco abuse   SOB (shortness of breath)   Acute respiratory failure (HCC)   Acute hypoxemic respiratory failure -She has not required BIPAP past 18 hours. -CT shows no evidence of PE, however she does have significant bilateral lower lobe atelectasis and right middle lobe atelectasis, radiologist is also concerned about a component of superimposed pneumonia.  There is no evidence of pulmonary edema. -No significant wheezing on exam. -Continue rocephin and azithro to cover community-acquired pneumonia. -Continue incentive spirometer. -Breathing treatments as needed.  Continue weaning steroids due to lack of wheezing on auscultation.. -Given long-standing tobacco abuse, would benefit from PFTs post discharge. -Getting ECHO and alpha 1 antitrypsin level per Dr. Luan Pulling recommendations. -Continue to try and wean oxygen.  Tobacco abuse -Tobacco cessation was discussed with the patient on 8/21.  Morbid obesity -Noted, counseled on lifestyle modifications.  Type 2 diabetes -CBGs have been elevated, likely due to steroid use, but improving with steroid titration. -A1c is elevated at  7.1. -Continue sliding scale.    DVT prophylaxis: Lovenox Code Status: Full code Family Communication: Husband at bedside updated on plan of care and all questions answered on 8/22. Disposition Plan: continue to wean oxygen, obtain pulmonary consultation.  Consultants:   Pulmonology  Procedures:   ECHO pending  Antimicrobials:  Anti-infectives (From admission, onward)   Start     Dose/Rate Route Frequency Ordered Stop   08/26/17 2000  cefTRIAXone (ROCEPHIN) 1 g in sodium chloride 0.9 % 100 mL IVPB     1 g 200 mL/hr over 30 Minutes Intravenous Every 24 hours 08/26/17 1700     08/25/17 2000  cefTRIAXone (ROCEPHIN) 1 g in sodium chloride 0.9 % 100 mL IVPB     1 g 200 mL/hr over 30 Minutes Intravenous  Once 08/25/17 1945 08/25/17 2040   08/25/17 2000  azithromycin (ZITHROMAX) 500 mg in sodium chloride 0.9 % 250 mL IVPB     500 mg 250 mL/hr over 60 Minutes Intravenous Every 24 hours 08/25/17 1945         Subjective: In bed, still SOB.  Objective: Vitals:   08/28/17 0605 08/28/17 0732 08/28/17 1042 08/28/17 1205  BP: 123/81     Pulse: 71     Resp: 18     Temp: 98 F (36.7 C)     TempSrc: Oral     SpO2: 98% 95% 95% 92%  Weight:      Height:        Intake/Output Summary (Last 24 hours) at 08/28/2017 1242 Last data filed at 08/28/2017 0900 Gross per 24 hour  Intake 480 ml  Output -  Net 480 ml   Filed Weights   08/25/17 2303 08/26/17 1434 08/27/17 0406  Weight: (!) 137.8 kg 132.4 kg (!) 138.9 kg    Examination:  General exam: Alert, awake, oriented x 3, obese Respiratory system: Bibasilar crackles. Respiratory effort normal. Cardiovascular system:RRR. No murmurs, rubs, gallops. Gastrointestinal system: Abdomen is nondistended, soft and nontender. No organomegaly or masses felt. Normal bowel sounds heard. Central nervous system: Alert and oriented. No focal neurological deficits. Extremities: No C/C/E, +pedal pulses Skin: No rashes, lesions or  ulcers Psychiatry: Judgement and insight appear normal. Mood & affect appropriate.       Data Reviewed: I have personally reviewed following labs and imaging studies  CBC: Recent Labs  Lab 08/23/17 1647 08/25/17 1928 08/26/17 0406 08/27/17 0357 08/28/17 0433  WBC 11.3* 14.7* 23.0* 23.8* 14.1*  NEUTROABS 6.9 12.9  --   --   --   HGB 16.1* 15.7* 15.1* 15.0 14.2  HCT 46.6* 46.5* 45.2 45.4 44.2  MCV 90.5 91.2 92.8 92.1 96.3  PLT 259 248 285 300 630   Basic Metabolic Panel: Recent Labs  Lab 08/23/17 1647 08/25/17 2023 08/26/17 0406 08/28/17 0433  NA 137 133* 136 138  K 3.7 3.9 4.2 3.6  CL 105 102 104 105  CO2 24 16* 20* 23  GLUCOSE 149* 386* 275* 122*  BUN 9 13 11 17   CREATININE 0.69 0.86 0.74 0.63  CALCIUM 8.8* 8.5* 8.5* 8.1*   GFR: Estimated Creatinine Clearance: 137.4 mL/min (by C-G formula based on SCr of 0.63 mg/dL). Liver Function Tests: No results for input(s): AST, ALT, ALKPHOS, BILITOT, PROT, ALBUMIN in the last 168 hours. No results for input(s): LIPASE, AMYLASE in the last 168 hours. No results for input(s): AMMONIA in the last 168 hours. Coagulation Profile: No results for input(s): INR, PROTIME in the last 168 hours. Cardiac Enzymes: Recent Labs  Lab 08/25/17 1928  TROPONINI <0.03   BNP (last 3 results) No results for input(s): PROBNP in the last 8760 hours. HbA1C: Recent Labs    08/25/17 2023  HGBA1C 7.1*   CBG: Recent Labs  Lab 08/27/17 1117 08/27/17 1644 08/27/17 2123 08/28/17 0726 08/28/17 1123  GLUCAP 200* 242* 321* 135* 183*   Lipid Profile: No results for input(s): CHOL, HDL, LDLCALC, TRIG, CHOLHDL, LDLDIRECT in the last 72 hours. Thyroid Function Tests: No results for input(s): TSH, T4TOTAL, FREET4, T3FREE, THYROIDAB in the last 72 hours. Anemia Panel: No results for input(s): VITAMINB12, FOLATE, FERRITIN, TIBC, IRON, RETICCTPCT in the last 72 hours. Urine analysis:    Component Value Date/Time   COLORURINE YELLOW  08/17/2012 [place] 08/17/2012 0339   LABSPEC 1.020 08/17/2012 0339   PHURINE 5.5 08/17/2012 0339   GLUCOSEU >1000 (A) 08/17/2012 0339   HGBUR TRACE (A) 08/17/2012 0339   BILIRUBINUR NEGATIVE 08/17/2012 0339   KETONESUR NEGATIVE 08/17/2012 0339   PROTEINUR NEGATIVE 08/17/2012 0339   UROBILINOGEN 0.2 08/17/2012 0339   NITRITE NEGATIVE 08/17/2012 0339   LEUKOCYTESUR NEGATIVE 08/17/2012 0339   Sepsis Labs: @LABRCNTIP (procalcitonin:4,lacticidven:4)  ) Recent Results (from the past 240 hour(s))  MRSA PCR Screening     Status: None   Collection Time: 08/25/17 10:50 PM  Result Value Ref Range Status   MRSA by PCR NEGATIVE NEGATIVE Final    Comment:        The GeneXpert MRSA Assay (FDA approved for NASAL specimens only), is one component of a comprehensive MRSA colonization surveillance program. It is not intended to diagnose MRSA infection nor to guide  or monitor treatment for MRSA infections. Performed at [facility], [address], [place], [place] [address]          Radiology Studies: Ct Angio Chest Pe W Or Wo Contrast  Result Date: 08/26/2017 CLINICAL DATA:  Central chest lpain since x 3 days. Radiating into RUQ of abdomen/lower lung. Hx of dm, pcos,ks, fatty liver, cholecystectomyWorsening of symptoms in last 2 hrs. EXAM: CT ANGIOGRAPHY CHEST WITH CONTRAST TECHNIQUE: Multidetector CT imaging of the chest was performed using the standard protocol during bolus administration of intravenous contrast. Multiplanar CT image reconstructions and MIPs were obtained to evaluate the vascular anatomy. CONTRAST:  112m ISOVUE-370 IOPAMIDOL (ISOVUE-370) INJECTION 76% COMPARISON:  Chest radiograph, 08/25/2017. FINDINGS: Cardiovascular: There is satisfactory opacification of the pulmonary arteries to the segmental level. There is no evidence of a pulmonary embolus. Heart is normal in size and configuration. No pericardial effusion. No coronary artery calcifications. Great  vessels are normal in caliber. No aortic dissection or atherosclerosis. Mediastinum/Nodes: No enlarged mediastinal, hilar, or axillary lymph nodes. Thyroid gland, trachea, and esophagus demonstrate no significant findings. Lungs/Pleura: Study appears expiratory. There is significant atelectasis both lower lobes. Milder atelectasis is noted at the base of the right middle lobe. Remainder of the lungs is clear. No pleural effusion. No pneumothorax. Upper Abdomen: No acute abnormality. Musculoskeletal: No chest wall abnormality. No acute or significant osseous findings. Review of the MIP images confirms the above findings. IMPRESSION: 1. No evidence of a pulmonary embolism. 2. There is significant lower lobe atelectasis bilaterally. There is milder right middle lobe atelectasis. Consider a component of superimposed pneumonia if there are consistent clinical findings. No evidence of pulmonary edema. Electronically Signed   By: DLajean ManesM.D.   On: 08/26/2017 16:38   Dg Chest Port 1 View  Result Date: 08/28/2017 CLINICAL DATA:  Shortness of breath and chest tightness EXAM: PORTABLE CHEST 1 VIEW COMPARISON:  Chest radiograph August 25, 2017 and chest CT August 26, 2017 FINDINGS: There is patchy airspace consolidation in each lung base. Lungs elsewhere are clear. Heart is upper normal in size with mild pulmonary venous hypertension. No adenopathy. No bone lesions. IMPRESSION: Airspace consolidation in the bases, more on the left than on the right, likely multifocal pneumonia. Appearance similar to 2 days prior. There is pulmonary vascular congestion.  No interstitial edema. Electronically Signed   By: WLowella GripIII M.D.   On: 08/28/2017 09:09        Scheduled Meds: . acetylcysteine  3 mL Nebulization Q6H  . enoxaparin (LOVENOX) injection  40 mg Subcutaneous Q24H  . insulin aspart  0-9 Units Subcutaneous TID WC  . ipratropium-albuterol  3 mL Nebulization Q6H  . methylPREDNISolone (SOLU-MEDROL)  injection  40 mg Intravenous Q24H  . nicotine  14 mg Transdermal Daily  . sodium chloride flush  3 mL Intravenous Q12H   Continuous Infusions: . sodium chloride    . azithromycin Stopped (08/27/17 2333)  . cefTRIAXone (ROCEPHIN)  IV Stopped (08/27/17 2103)     LOS: 3 days    Time spent: 25 minutes.     ELelon Frohlich MD [facility] Pager [phone] If 7PM-7AM, please contact night-coverage [email or web address] Password T[facility]8/23/2019, 12:42 PM   \

## 2017-08-28 NOTE — Consult Note (Signed)
Consult requested by: Dr. Jerilee Hoh Consult requested for: Hypoxia  HPI: This is a 32 year old who has history of diabetes obesity polycystic ovaries syndrome and who smokes about a package of cigarettes daily.  She had been seen in the emergency department and was given steroids and albuterol.  She did this but she did not get any better and then she was started on Zithromax and Spiriva.  She still did not improve.  She feels like she is had fever but does not have documented fever.  She has been wheezing.  She is not having any swelling of her legs.  She is not having any chest pain.  She has not had any known pulmonary disease with the exception of asthma.  There is a positive family history of pulmonary disease in multiple family members.  She had CT of the chest that I have personally reviewed the does show atelectasis in both bases  Past Medical History:  Diagnosis Date  . Acid reflux   . Asthma   . Clostridium difficile infection    in the past  . Diabetes mellitus   . Fatty liver   . Fibrocystic breast disease   . Gout   . Hidradenitis   . History of kidney stones   . Hyperlipemia   . Obesity   . Polycystic ovary syndrome   . PONV (postoperative nausea and vomiting)   . Smoker      Family History  Problem Relation Age of Onset  . Stroke Other        paternal great grandfather  . Diabetes Father   . Hypertension Father   . Cancer Maternal Grandfather        unclear  . Heart attack Paternal Grandfather   . Hypertension Paternal Grandmother   . Diabetes Paternal Grandmother   . Gout Paternal Grandmother   . Hypertension Maternal Grandmother   . Other Mother        pre-cancerous cells; had hyst  . Gout Brother   . Cancer Sister        cervical; had hyst     Social History   Socioeconomic History  . Marital status: Married    Spouse name: Not on file  . Number of children: Not on file  . Years of education: Not on file  . Highest education level: Not on file   Occupational History  . Occupation: nurse  Social Needs  . Financial resource strain: Not hard at all  . Food insecurity:    Worry: Never true    Inability: Never true  . Transportation needs:    Medical: No    Non-medical: No  Tobacco Use  . Smoking status: Current Every Day Smoker    Packs/day: 1.00    Years: 10.00    Pack years: 10.00    Types: Cigarettes  . Smokeless tobacco: Never Used  Substance and Sexual Activity  . Alcohol use: Yes    Comment: twice a month  . Drug use: No  . Sexual activity: Yes    Birth control/protection: None  Lifestyle  . Physical activity:    Days per week: Not on file    Minutes per session: Not on file  . Stress: Not on file  Relationships  . Social connections:    Talks on phone: More than three times a week    Gets together: Once a week    Attends religious service: More than 4 times per year    Active member of club or  organization: No    Attends meetings of clubs or organizations: Never    Relationship status: Married  Other Topics Concern  . 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  Eyes: Pupils react EOMI.  Ears nose mouth and throat: Mucous membranes are moist.  Hearing is grossly normal.  Cardiovascular: Her heart is regular with distant heart sounds.  Respiratory: Respiratory effort is increased.  She has some rhonchi posteriorly bilaterally.  Gastrointestinal: She is  obese has normal bowel sounds.  Musculoskeletal: Normal strength of the upper and lower extremities bilaterally.  Neurological: No focal abnormalities.  Psychiatric: Normal mood and affect  Lab Results: Basic Metabolic Panel: Recent Labs    08/26/17 0406 08/28/17 0433  NA 136 138  K 4.2 3.6  CL 104 105  CO2 20* 23  GLUCOSE 275* 122*  BUN 11 17  CREATININE 0.74 0.63  CALCIUM 8.5* 8.1*   Liver Function Tests: No results for input(s): AST, ALT, ALKPHOS, BILITOT, PROT, ALBUMIN in the last 72 hours. No results for input(s): LIPASE, AMYLASE in the last 72 hours. No results for input(s): AMMONIA in the last 72 hours. CBC: Recent Labs    08/25/17 1928  08/27/17 0357 08/28/17 0433  WBC 14.7*   < > 23.8* 14.1*  NEUTROABS 12.9  --   --   --   HGB 15.7*   < > 15.0 14.2  HCT 46.5*   < > 45.4 44.2  MCV 91.2   < > 92.1 96.3  PLT 248   < > 300 201   < > = values in this interval not displayed.   Cardiac Enzymes: Recent Labs    08/25/17 1928  TROPONINI <0.03   BNP: No results for input(s): PROBNP in the last 72 hours. D-Dimer: Recent Labs    08/25/17 2023  DDIMER <0.27   CBG: Recent Labs    08/27/17 0407 08/27/17 0733 08/27/17 1117 08/27/17 1644 08/27/17 2123 08/28/17 0726  GLUCAP 240* 199* 200* 242* 321* 135*   Hemoglobin A1C: Recent Labs    08/25/17 2023  HGBA1C 7.1*   Fasting Lipid Panel: No results for input(s): CHOL, HDL, LDLCALC, TRIG, CHOLHDL, LDLDIRECT in the last 72 hours. Thyroid Function Tests: No results for input(s): TSH, T4TOTAL, FREET4, T3FREE, THYROIDAB in the last 72 hours. Anemia Panel: No results for input(s): VITAMINB12, FOLATE, FERRITIN, TIBC, IRON, RETICCTPCT in the last 72 hours. Coagulation: No results for input(s): LABPROT, INR in the last 72 hours. Urine Drug Screen: Drugs of Abuse  No results found for: LABOPIA, COCAINSCRNUR, LABBENZ, AMPHETMU, THCU, LABBARB  Alcohol Level: No results for input(s): ETH in the last 72  hours. Urinalysis: No results for input(s): COLORURINE, LABSPEC, PHURINE, GLUCOSEU, HGBUR, BILIRUBINUR, KETONESUR, PROTEINUR, UROBILINOGEN, NITRITE, LEUKOCYTESUR in the last 72 hours.  Invalid input(s): APPERANCEUR Misc. Labs:   ABGS: No results for input(s): PHART, PO2ART, TCO2,  HCO3 in the last 72 hours.  Invalid input(s): PCO2   MICROBIOLOGY: Recent Results (from the past 240 hour(s))  MRSA PCR Screening     Status: None   Collection Time: 08/25/17 10:50 PM  Result Value Ref Range Status   MRSA by PCR NEGATIVE NEGATIVE Final    Comment:        The GeneXpert MRSA Assay (FDA approved for NASAL specimens only), is one component of a comprehensive MRSA colonization surveillance program. It is not intended to diagnose MRSA infection nor to guide or monitor treatment for MRSA infections. Performed at [facility], [address] [place], [place] [address]     Studies/Results: Ct Angio Chest Pe W Or Wo Contrast  Result Date: 08/26/2017 CLINICAL DATA:  Central chest lpain since x 3 days. Radiating into RUQ of abdomen/lower lung. Hx of dm, pcos,ks, fatty liver, cholecystectomyWorsening of symptoms in last 2 hrs. EXAM: CT ANGIOGRAPHY CHEST WITH CONTRAST TECHNIQUE: Multidetector CT imaging of the chest was performed using the standard protocol during bolus administration of intravenous contrast. Multiplanar CT image reconstructions and MIPs were obtained to evaluate the vascular anatomy. CONTRAST:  178m ISOVUE-370 IOPAMIDOL (ISOVUE-370) INJECTION 76% COMPARISON:  Chest radiograph, 08/25/2017. FINDINGS: Cardiovascular: There is satisfactory opacification of the pulmonary arteries to the segmental level. There is no evidence of a pulmonary embolus. Heart is normal in size and configuration. No pericardial effusion. No coronary artery calcifications. Great vessels are normal in caliber. No aortic dissection or atherosclerosis. Mediastinum/Nodes: No enlarged mediastinal, hilar, or axillary  lymph nodes. Thyroid gland, trachea, and esophagus demonstrate no significant findings. Lungs/Pleura: Study appears expiratory. There is significant atelectasis both lower lobes. Milder atelectasis is noted at the base of the right middle lobe. Remainder of the lungs is clear. No pleural effusion. No pneumothorax. Upper Abdomen: No acute abnormality. Musculoskeletal: No chest wall abnormality. No acute or significant osseous findings. Review of the MIP images confirms the above findings. IMPRESSION: 1. No evidence of a pulmonary embolism. 2. There is significant lower lobe atelectasis bilaterally. There is milder right middle lobe atelectasis. Consider a component of superimposed pneumonia if there are consistent clinical findings. No evidence of pulmonary edema. Electronically Signed   By: DLajean ManesM.D.   On: 08/26/2017 16:38   Dg Chest Port 1 View  Result Date: 08/28/2017 CLINICAL DATA:  Shortness of breath and chest tightness EXAM: PORTABLE CHEST 1 VIEW COMPARISON:  Chest radiograph August 25, 2017 and chest CT August 26, 2017 FINDINGS: There is patchy airspace consolidation in each lung base. Lungs elsewhere are clear. Heart is upper normal in size with mild pulmonary venous hypertension. No adenopathy. No bone lesions. IMPRESSION: Airspace consolidation in the bases, more on the left than on the right, likely multifocal pneumonia. Appearance similar to 2 days prior. There is pulmonary vascular congestion.  No interstitial edema. Electronically Signed   By: WLowella GripIII M.D.   On: 08/28/2017 09:09    Medications:  Prior to Admission:  Medications Prior to Admission  Medication Sig Dispense Refill Last Dose  . albuterol (PROVENTIL) (5 MG/ML) 0.5% nebulizer solution Take 0.5 mLs (2.5 mg total) by nebulization every 6 (six) hours as needed for wheezing or shortness of breath. 20 mL 0 08/25/2017 at Unknown time  . allopurinol (ZYLOPRIM) 300 MG tablet Take 300 mg by mouth every evening.     08/25/2017 at Unknown time  . azithromycin (ZITHROMAX) 250 MG tablet Take 250-500 mg by mouth See admin instructions. 5046mon day 1 then take  254m on days 2 through 5 starting on 08/24/2017  0 08/25/2017 at Unknown time  . budesonide-formoterol (SYMBICORT) 160-4.5 MCG/ACT inhaler Inhale 1 puff into the lungs 2 (two) times daily.   08/25/2017 at Unknown time  . Calcium Carb-Cholecalciferol [phone] MG-UNIT CAPS Take 2 capsules by mouth every evening.   08/24/2017 at Unknown time  . fexofenadine (ALLEGRA) 180 MG tablet Take 180 mg by mouth every evening.   08/24/2017 at Unknown time  . fluticasone (FLONASE) 50 MCG/ACT nasal spray Place 2 sprays into both nostrils every evening.   08/24/2017 at Unknown time  . ibuprofen (ADVIL,MOTRIN) 200 MG tablet Take 800 mg by mouth every 6 (six) hours as needed for mild pain or moderate pain.   Past Week at Unknown time  . ipratropium (ATROVENT) 0.02 % nebulizer solution Take 2.5 mLs (0.5 mg total) by nebulization 4 (four) times daily. 75 mL 0 08/25/2017 at Unknown time  . KOMBIGLYZE XR 2.05-998 MG TB24 Take 1 tablet by mouth every evening.   11 08/24/2017 at Unknown time  . pantoprazole (PROTONIX) 40 MG tablet Take 40-80 mg by mouth every evening.   11 08/24/2017 at Unknown time  . predniSONE (DELTASONE) 20 MG tablet Take 3 tablets (60 mg total) by mouth daily for 5 days. 15 tablet 0 08/25/2017 at Unknown time  . rizatriptan (MAXALT) 5 MG tablet Take 5 mg by mouth as needed for migraine. May repeat in 2 hours if needed   unknown   Scheduled: . enoxaparin (LOVENOX) injection  40 mg Subcutaneous Q24H  . insulin aspart  0-9 Units Subcutaneous TID WC  . ipratropium-albuterol  3 mL Nebulization Q6H  . methylPREDNISolone (SOLU-MEDROL) injection  40 mg Intravenous Q24H  . nicotine  14 mg Transdermal Daily  . sodium chloride flush  3 mL Intravenous Q12H   Continuous: . sodium chloride    . azithromycin Stopped (08/27/17 2333)  . cefTRIAXone (ROCEPHIN)  IV Stopped (08/27/17  2103)   PXUX:YBFXOVchloride, albuterol, sodium chloride flush  Assesment: She has hypoxia and I think she is probably got pneumonia based on the CT.  She is being adequately treated for that.  She is also receiving steroids.  She does have a history of asthma.  She is not coughing much up so I am going to add Mucomyst  I would check alpha-1 antitrypsin level and echocardiogram to be sure were not missing other causes of her shortness of breath and hypoxia Principal Problem:   Bronchospasm, acute Active Problems:   Diabetes mellitus type 2 in obese (HCC)   Morbid obesity ([facility]   Polycystic ovary disease   Tobacco abuse   SOB (shortness of breath)   Acute respiratory failure ([facility]    Plan: As above    LOS: 3 days   Damare Serano L 08/28/2017, 10:07 AM

## 2017-08-28 NOTE — Progress Notes (Signed)
Physical Therapy Treatment Patient Details Name: Cheryl Black MRN: [id number] DOB: 1985-02-05 Today's Date: 08/28/2017    History of Present Illness Cheryl Black is a 32 y.o. female with medical history significant of diabetes, PCOS, obesity, pack a day smoker with no prior history of asthma or COPD comes in with several days of shortness of breath and wheezing at home.  Patient was seen here in the ED on August 18 and given oral steroids and albuterol to take at home.  She did this around the clock was not getting any better she saw her primary care physician yesterday who added on azithromycin orally and Spiriva inhaler.  Patient reports she continued to still not get better.  She denies any fevers but says that she is having a spasmy cough.  She denies any lower extremity swelling or edema.  She has no history of VT and has not really had any recent traveling.  She is not on any estrogen medications.  She does not have chest pain but reports some right sided posterior pain when taking a deep breath.  Patient is being referred for admission for bronchospasm.  She is been on BiPAP for the last couple of hours and she feels like this is helping her.  Per respiratory therapy she is wheezing less.  Patient be referred for admission for acute respiratory failure requiring BiPAP support.    PT Comments    Patient presents on 3 LPM O2, and prior to attempting gait training patient put on room air with O2 saturation at 92-93%, after taking a few steps O2 saturation dropped to 86%, upped O2 up to 2 LPM, patient continued to desaturate and put back on 3 LPM for gait training.  Patient demonstrates slow labored cadence with tendency to lean on side rail for support, mostly mouth breathing and instructed in pursed lipped breathing with good carryover demonstrated with O2 saturation between 89-92% during gait training.  Patient encouraged to ambulate with her spouse and nursing staff using supplemental O2 as  needed.  Patient will benefit from continued physical therapy in hospital and recommended venue below to increase strength, balance, endurance for safe ADLs and gait.   Follow Up Recommendations  Home health PT;Supervision - Intermittent     Equipment Recommendations  None recommended by PT    Recommendations for Other Services       Precautions / Restrictions Precautions Precautions: None Restrictions Weight Bearing Restrictions: No    Mobility  Bed Mobility Overal bed mobility: Modified Independent             General bed mobility comments: head of bed raised  Transfers Overall transfer level: Needs assistance Equipment used: None;1 person hand held assist Transfers: Sit to/from Stand;Stand Pivot Transfers Sit to Stand: Supervision Stand pivot transfers: Supervision       General transfer comment: slow slightly unsteady movement with tendency to lean on nearby objects for support  Ambulation/Gait Ambulation/Gait assistance: Supervision Gait Distance (Feet): 100 Feet Assistive device: None;1 person hand held assist Gait Pattern/deviations: Decreased step length - right;Decreased step length - left;Decreased stance time - left Gait velocity: slow   General Gait Details: slow slightly labored cadence with frequent use of side rail for support, no loss of balance, on 3 LPM O2, limited secondary to fatigue/mild SOB    Stairs             Wheelchair Mobility    Modified Rankin (Stroke Patients Only)       Balance Overall  balance assessment: Mild deficits observed, not formally tested                                          Cognition Arousal/Alertness: Awake/alert Behavior During Therapy: WFL for tasks assessed/performed Overall Cognitive Status: Within Functional Limits for tasks assessed                                        Exercises      General Comments        Pertinent Vitals/Pain Pain Assessment:  No/denies pain    Home Living                      Prior Function            PT Goals (current goals can now be found in the care plan section) Acute Rehab PT Goals Patient Stated Goal: return home PT Goal Formulation: With patient/family Time For Goal Achievement: 09/02/17 Potential to Achieve Goals: Good    Frequency    Min 3X/week      PT Plan Current plan remains appropriate    Co-evaluation              AM-PAC PT "6 Clicks" Daily Activity  Outcome Measure  Difficulty turning over in bed (including adjusting bedclothes, sheets and blankets)?: None Difficulty moving from lying on back to sitting on the side of the bed? : None Difficulty sitting down on and standing up from a chair with arms (e.g., wheelchair, bedside commode, etc,.)?: None Help needed moving to and from a bed to chair (including a wheelchair)?: A Little Help needed walking in hospital room?: A Little Help needed climbing 3-5 steps with a railing? : A Little 6 Click Score: 21    End of Session Equipment Utilized During Treatment: Oxygen Activity Tolerance: Patient tolerated treatment well;Patient limited by fatigue Patient left: in bed;with call bell/phone within reach;with family/visitor present(seated at bedside) Nurse Communication: Mobility status PT Visit Diagnosis: Unsteadiness on feet (R26.81);Other abnormalities of gait and mobility (R26.89);Muscle weakness (generalized) (M62.81)     Time: [id number] PT Time Calculation (min) (ACUTE ONLY): 29 min  Charges:  $Therapeutic Activity: 23-37 mins                     2:15 PM, 08/28/17 Ocie BobJames Yanelli Zapanta, MPT Physical Therapist [facility] [phone] office [phone] mobile phone

## 2017-08-29 DIAGNOSIS — Z720 Tobacco use: Secondary | ICD-10-CM

## 2017-08-29 DIAGNOSIS — R0902 Hypoxemia: Secondary | ICD-10-CM

## 2017-08-29 MED ORDER — AZITHROMYCIN 250 MG PO TABS
500.0000 mg | ORAL_TABLET | Freq: Every day | ORAL | Status: DC
  Administered 2017-08-29 – 2017-08-30 (×2): 500 mg via ORAL
  Filled 2017-08-29 (×2): qty 2

## 2017-08-29 NOTE — Progress Notes (Signed)
Subjective: She says she feels better.  She is less congested.  She is coughing nonproductively.  No other new complaints.  Objective: Vital signs in last 24 hours: Temp:  [98 F (36.7 C)-98.4 F (36.9 C)] 98.4 F (36.9 C) (08/24 0523) Pulse Rate:  [67-71] 67 (08/24 0523) Resp:  [18-20] 18 (08/24 0523) BP: (110-140)/(71-81) 140/81 (08/24 0523) SpO2:  [90 %-99 %] 94 % (08/24 0742) Weight change:  Last BM Date: 08/26/17  Intake/Output from previous day: 08/23 0701 - 08/24 0700 In: 2250 [P.O.:720; IV Piggyback:1530] Out: -   PHYSICAL EXAM General appearance: alert, cooperative, no distress and moderately obese Resp: rhonchi bilaterally Cardio: regular rate and rhythm, S1, S2 normal, no murmur, click, rub or gallop GI: soft, non-tender; bowel sounds normal; no masses,  no organomegaly Extremities: extremities normal, atraumatic, no cyanosis or edema  Lab Results:  Results for orders placed or performed during the hospital encounter of 08/25/17 (from the past 48 hour(s))  Glucose, capillary     Status: Abnormal   Collection Time: 08/27/17 11:17 AM  Result Value Ref Range   Glucose-Capillary 200 (H) 70 - 99 mg/dL  Glucose, capillary     Status: Abnormal   Collection Time: 08/27/17  4:44 PM  Result Value Ref Range   Glucose-Capillary 242 (H) 70 - 99 mg/dL   Comment 1 Notify RN    Comment 2 Document in Chart   Glucose, capillary     Status: Abnormal   Collection Time: 08/27/17  9:23 PM  Result Value Ref Range   Glucose-Capillary 321 (H) 70 - 99 mg/dL  Basic metabolic panel     Status: Abnormal   Collection Time: 08/28/17  4:33 AM  Result Value Ref Range   Sodium 138 135 - 145 mmol/L   Potassium 3.6 3.5 - 5.1 mmol/L   Chloride 105 98 - 111 mmol/L   CO2 23 22 - 32 mmol/L   Glucose, Bld 122 (H) 70 - 99 mg/dL   BUN 17 6 - 20 mg/dL   Creatinine, Ser 0.63 0.44 - 1.00 mg/dL   Calcium 8.1 (L) 8.9 - 10.3 mg/dL   GFR calc non Af Amer >60 >60 mL/min   GFR calc Af Amer >60 >60  mL/min    Comment: (NOTE) The eGFR has been calculated using the CKD EPI equation. This calculation has not been validated in all clinical situations. eGFR's persistently <60 mL/min signify possible Chronic Kidney Disease.    Anion gap 10 5 - 15    Comment: Performed at [facility], [address] [place], [place] [address]  CBC     Status: Abnormal   Collection Time: 08/28/17  4:33 AM  Result Value Ref Range   WBC 14.1 (H) 4.0 - 10.5 K/uL   RBC 4.59 3.87 - 5.11 MIL/uL   Hemoglobin 14.2 12.0 - 15.0 g/dL   HCT 44.2 36.0 - 46.0 %   MCV 96.3 78.0 - 100.0 fL   MCH 30.9 26.0 - 34.0 pg   MCHC 32.1 30.0 - 36.0 g/dL   RDW 14.0 11.5 - 15.5 %   Platelets 201 150 - 400 K/uL    Comment: Performed at [facility], [address], [place],  [address]  Glucose, capillary     Status: Abnormal   Collection Time: 08/28/17  7:26 AM  Result Value Ref Range   Glucose-Capillary 135 (H) 70 - 99 mg/dL  Glucose, capillary     Status: Abnormal   Collection Time: 08/28/17 11:23 AM  Result Value  Ref Range   Glucose-Capillary 183 (H) 70 - 99 mg/dL  Glucose, capillary     Status: Abnormal   Collection Time: 08/28/17  4:21 PM  Result Value Ref Range   Glucose-Capillary 307 (H) 70 - 99 mg/dL  Glucose, capillary     Status: Abnormal   Collection Time: 08/28/17  9:34 PM  Result Value Ref Range   Glucose-Capillary 189 (H) 70 - 99 mg/dL   Comment 1 Notify RN    Comment 2 Document in Chart     ABGS No results for input(s): PHART, PO2ART, TCO2, HCO3 in the last 72 hours.  Invalid input(s): PCO2 CULTURES Recent Results (from the past 240 hour(s))  MRSA PCR Screening     Status: None   Collection Time: 08/25/17 10:50 PM  Result Value Ref Range Status   MRSA by PCR NEGATIVE NEGATIVE Final    Comment:        The GeneXpert MRSA Assay (FDA approved for NASAL specimens only), is one component of a comprehensive MRSA colonization surveillance program. It is not intended to diagnose MRSA infection  nor to guide or monitor treatment for MRSA infections. Performed at [facility], [address], [place], [place] [address]    Studies/Results: Dg Chest Port 1 View  Result Date: 08/28/2017 CLINICAL DATA:  Shortness of breath and chest tightness EXAM: PORTABLE CHEST 1 VIEW COMPARISON:  Chest radiograph August 25, 2017 and chest CT August 26, 2017 FINDINGS: There is patchy airspace consolidation in each lung base. Lungs elsewhere are clear. Heart is upper normal in size with mild pulmonary venous hypertension. No adenopathy. No bone lesions. IMPRESSION: Airspace consolidation in the bases, more on the left than on the right, likely multifocal pneumonia. Appearance similar to 2 days prior. There is pulmonary vascular congestion.  No interstitial edema. Electronically Signed   By: Lowella Grip III M.D.   On: 08/28/2017 09:09    Medications:  Prior to Admission:  Medications Prior to Admission  Medication Sig Dispense Refill Last Dose  . albuterol (PROVENTIL) (5 MG/ML) 0.5% nebulizer solution Take 0.5 mLs (2.5 mg total) by nebulization every 6 (six) hours as needed for wheezing or shortness of breath. 20 mL 0 08/25/2017 at Unknown time  . allopurinol (ZYLOPRIM) 300 MG tablet Take 300 mg by mouth every evening.    08/25/2017 at Unknown time  . azithromycin (ZITHROMAX) 250 MG tablet Take 250-500 mg by mouth See admin instructions. 558m on day 1 then take 2562mon days 2 through 5 starting on 08/24/2017  0 08/25/2017 at Unknown time  . budesonide-formoterol (SYMBICORT) 160-4.5 MCG/ACT inhaler Inhale 1 puff into the lungs 2 (two) times daily.   08/25/2017 at Unknown time  . Calcium Carb-Cholecalciferol [phone] MG-UNIT CAPS Take 2 capsules by mouth every evening.   08/24/2017 at Unknown time  . fexofenadine (ALLEGRA) 180 MG tablet Take 180 mg by mouth every evening.   08/24/2017 at Unknown time  . fluticasone (FLONASE) 50 MCG/ACT nasal spray Place 2 sprays into both nostrils every evening.   08/24/2017  at Unknown time  . ibuprofen (ADVIL,MOTRIN) 200 MG tablet Take 800 mg by mouth every 6 (six) hours as needed for mild pain or moderate pain.   Past Week at Unknown time  . ipratropium (ATROVENT) 0.02 % nebulizer solution Take 2.5 mLs (0.5 mg total) by nebulization 4 (four) times daily. 75 mL 0 08/25/2017 at Unknown time  . KOMBIGLYZE XR 2.05-998 MG TB24 Take 1 tablet by mouth every evening.   11 08/24/2017  at Unknown time  . pantoprazole (PROTONIX) 40 MG tablet Take 40-80 mg by mouth every evening.   11 08/24/2017 at Unknown time  . [EXPIRED] predniSONE (DELTASONE) 20 MG tablet Take 3 tablets (60 mg total) by mouth daily for 5 days. 15 tablet 0 08/25/2017 at Unknown time  . rizatriptan (MAXALT) 5 MG tablet Take 5 mg by mouth as needed for migraine. May repeat in 2 hours if needed   unknown   Scheduled: . acetylcysteine  3 mL Nebulization Q6H  . azithromycin  500 mg Oral QHS  . enoxaparin (LOVENOX) injection  40 mg Subcutaneous Q24H  . insulin aspart  0-9 Units Subcutaneous TID WC  . ipratropium-albuterol  3 mL Nebulization Q6H  . methylPREDNISolone (SOLU-MEDROL) injection  40 mg Intravenous Q24H  . nicotine  14 mg Transdermal Daily  . pantoprazole  40 mg Oral Daily  . sodium chloride flush  3 mL Intravenous Q12H   Continuous: . sodium chloride    . cefTRIAXone (ROCEPHIN)  IV Stopped (08/28/17 2029)   QTM:AUQJFH chloride, albuterol, sodium chloride flush  Assesment: She was admitted with acute bronchospasm and hypoxia.  CT is read as atelectasis but considering her clinical course and the hypoxia I think she has pneumonia.  She is being appropriately treated for that.  She does seem to be improving.  Echocardiogram was essentially normal and did not show any area of potential shunting. Principal Problem:   Bronchospasm, acute Active Problems:   Diabetes mellitus type 2 in obese (HCC)   Morbid obesity ([place])   Polycystic ovary disease   Tobacco abuse   SOB (shortness of breath)   Acute  respiratory failure ([place])    Plan: Continue current treatments.  She does seem to be better.    LOS: 4 days   Cheryl Black L 08/29/2017, 10:24 AM

## 2017-08-29 NOTE — Progress Notes (Signed)
PROGRESS NOTE    Cheryl Black  BHA:[id number] DOB: 07-17-1985 DOA: 08/25/2017 PCP: [name], MD    Brief Narrative:  32 year old female who presented with dyspnea.  She does have significant past medical history for type 2 diabetes mellitus, obesity, asthma/COPD and tobacco abuse.  Reported several days of dyspnea and wheezing, refractive to outpatient management with steroids and bronchodilators.  On her initial evaluation she was in respiratory distress and placed on noninvasive mechanical ventilation, her blood pressure was 122/70, heart rate 98, respiratory rate 21-30, oxygen saturation 97%.  Lungs with decreased air movement, heart S1-S2 present, rhythmic, tachycardic, abdomen soft nontender, no lower extremity edema.  Chest x-ray had right middle lobe opacity.  Patient was admitted to the hospital with working diagnosis of acute hypoxic respiratory failure due to asthma exacerbation.  Assessment & Plan:   Principal Problem:   Bronchospasm, acute Active Problems:   Diabetes mellitus type 2 in obese (HCC)   Morbid obesity (HCC)   Polycystic ovary disease   Tobacco abuse   SOB (shortness of breath)   Acute respiratory failure ([place])  1. Acute hypoxic respiratory failure due to right middle lobe community aquired pneumonia. Continue antibiotic therapy with ceftriaxone and azithromycin. Bronchodilators and systemic steroids. Patient continue to have desaturation on ambulation. Will repeat ambulatory oxymetry on room air today, if persistent hypoxemia, may need home 02 until full recovery.   2. Tobacco abuse. Continue smoking cessation, nicotine patch.    3. T2DM. Will continue insulin sliding scale for glucose cover and monitoring, capillary glucose continue to be elevated 321, 135, 183, 307., 189. Continue to taper steroids.   4. Morbid obesity. Will need life style modification, follow up as outpatient.    DVT prophylaxis: enoxaparin   Code Status:  full Family  Communication: I spoke with patient's husband at the bedside and all questions were addressed.  Disposition Plan/ discharge barriers:  Possible dc in am, may need home 02.    Consultants:   Pulmonary   Procedures:     Antimicrobials:   Azithromycin  Ceftriaxone     Subjective: Patient continue to have dyspnea and desaturation on exertion, at rest her dyspnea has improved but not completely back to baseline, no nausea or vomiting, no chest pain.   Objective: Vitals:   08/28/17 2153 08/29/17 0249 08/29/17 0523 08/29/17 0742  BP: 110/73  140/81   Pulse: 67  67   Resp:   18   Temp: 98 F (36.7 C)  98.4 F (36.9 C)   TempSrc: Oral  Oral   SpO2: 99% 96% 99% 94%  Weight:      Height:        Intake/Output Summary (Last 24 hours) at 08/29/2017 0910 Last data filed at 08/29/2017 0300 Gross per 24 hour  Intake 2010 ml  Output -  Net 2010 ml   Filed Weights   08/25/17 2303 08/26/17 1434 08/27/17 0406  Weight: (!) 137.8 kg 132.4 kg (!) 138.9 kg    Examination:   General: Not in pain or dyspnea.  Neurology: Awake and alert, non focal  E ENT: no pallor, no icterus, oral mucosa moist Cardiovascular: No JVD. S1-S2 present, rhythmic, no gallops, rubs, or murmurs. No lower extremity edema. Pulmonary: positive breath sounds bilaterally, adequate air movement, no wheezing or rhonchi, scattered rales. Gastrointestinal. Abdomen protuberant with no organomegaly, non tender, no rebound or guarding Skin. No rashes Musculoskeletal: no joint deformities     Data Reviewed: I have personally reviewed following labs and  imaging studies  CBC: Recent Labs  Lab 08/23/17 1647 08/25/17 1928 08/26/17 0406 08/27/17 0357 08/28/17 0433  WBC 11.3* 14.7* 23.0* 23.8* 14.1*  NEUTROABS 6.9 12.9  --   --   --   HGB 16.1* 15.7* 15.1* 15.0 14.2  HCT 46.6* 46.5* 45.2 45.4 44.2  MCV 90.5 91.2 92.8 92.1 96.3  PLT 259 248 285 300 681   Basic Metabolic Panel: Recent Labs  Lab  08/23/17 1647 08/25/17 2023 08/26/17 0406 08/28/17 0433  NA 137 133* 136 138  K 3.7 3.9 4.2 3.6  CL 105 102 104 105  CO2 24 16* 20* 23  GLUCOSE 149* 386* 275* 122*  BUN 9 13 11 17   CREATININE 0.69 0.86 0.74 0.63  CALCIUM 8.8* 8.5* 8.5* 8.1*   GFR: Estimated Creatinine Clearance: 137.4 mL/min (by C-G formula based on SCr of 0.63 mg/dL). Liver Function Tests: No results for input(s): AST, ALT, ALKPHOS, BILITOT, PROT, ALBUMIN in the last 168 hours. No results for input(s): LIPASE, AMYLASE in the last 168 hours. No results for input(s): AMMONIA in the last 168 hours. Coagulation Profile: No results for input(s): INR, PROTIME in the last 168 hours. Cardiac Enzymes: Recent Labs  Lab 08/25/17 1928  TROPONINI <0.03   BNP (last 3 results) No results for input(s): PROBNP in the last 8760 hours. HbA1C: No results for input(s): HGBA1C in the last 72 hours. CBG: Recent Labs  Lab 08/27/17 2123 08/28/17 0726 08/28/17 1123 08/28/17 1621 08/28/17 2134  GLUCAP 321* 135* 183* 307* 189*   Lipid Profile: No results for input(s): CHOL, HDL, LDLCALC, TRIG, CHOLHDL, LDLDIRECT in the last 72 hours. Thyroid Function Tests: No results for input(s): TSH, T4TOTAL, FREET4, T3FREE, THYROIDAB in the last 72 hours. Anemia Panel: No results for input(s): VITAMINB12, FOLATE, FERRITIN, TIBC, IRON, RETICCTPCT in the last 72 hours.    Radiology Studies: I have reviewed all of the imaging during this hospital visit personally     Scheduled Meds: . acetylcysteine  3 mL Nebulization Q6H  . azithromycin  500 mg Oral QHS  . enoxaparin (LOVENOX) injection  40 mg Subcutaneous Q24H  . insulin aspart  0-9 Units Subcutaneous TID WC  . ipratropium-albuterol  3 mL Nebulization Q6H  . methylPREDNISolone (SOLU-MEDROL) injection  40 mg Intravenous Q24H  . nicotine  14 mg Transdermal Daily  . pantoprazole  40 mg Oral Daily  . sodium chloride flush  3 mL Intravenous Q12H   Continuous Infusions: .  sodium chloride    . cefTRIAXone (ROCEPHIN)  IV Stopped (08/28/17 2029)     LOS: 4 days        Cosandra Plouffe Gerome Apley, MD [facility] Pager [phone]

## 2017-08-29 NOTE — Progress Notes (Signed)
SATURATION QUALIFICATIONS: (This note is used to comply with regulatory documentation for home oxygen)  Patient Saturations on Room Air at Rest = 89%  Patient Saturations on Room Air while Ambulating = 89%  Patient Saturations on 2 Liters of oxygen while Ambulating =92-94%

## 2017-08-29 NOTE — Progress Notes (Signed)
Pt has ambulated several times around the nurses station without oxygen. O2 levels are stated in the previous note. Tolerated fair. Will continue to monitor.

## 2017-08-29 NOTE — Progress Notes (Signed)
PHARMACIST - PHYSICIAN COMMUNICATION DR:   Ella JubileeArrien CONCERNING: Antibiotic IV to Oral Route Change Policy  RECOMMENDATION: This patient is receiving ZITHROMAX by the intravenous route.  Based on criteria approved by the Pharmacy and Therapeutics Committee, the antibiotic(s) is/are being converted to the equivalent oral dose form(s).   DESCRIPTION: These criteria include:  Patient being treated for a respiratory tract infection, urinary tract infection, cellulitis or clostridium difficile associated diarrhea if on metronidazole  The patient is not neutropenic and does not exhibit a GI malabsorption state  The patient is eating (either orally or via tube) and/or has been taking other orally administered medications for a least 24 hours  The patient is improving clinically and has a Tmax < 100.5  If you have questions about this conversion, please contact the Pharmacy Department  [x]   [phone]( [phone] )  [facility] []   [phone]( [phone] )  [facility] []   [phone]( [phone]  [facility] []   [phone]( [phone]  [facility] []   [phone]( [phone] )  [facility]   Valrie HartScott Torie Towle, PharmD Clinical Pharmacist 08/29/2017

## 2017-08-30 ENCOUNTER — Inpatient Hospital Stay (HOSPITAL_COMMUNITY): Payer: 59

## 2017-08-30 LAB — CBC WITH DIFFERENTIAL/PLATELET
Basophils Absolute: 0 10*3/uL (ref 0.0–0.1)
Basophils Relative: 0 %
EOS PCT: 1 %
Eosinophils Absolute: 0.1 10*3/uL (ref 0.0–0.7)
HEMATOCRIT: 45.6 % (ref 36.0–46.0)
Hemoglobin: 15.2 g/dL — ABNORMAL HIGH (ref 12.0–15.0)
LYMPHS ABS: 4.5 10*3/uL — AB (ref 0.7–4.0)
LYMPHS PCT: 22 %
MCH: 30.5 pg (ref 26.0–34.0)
MCHC: 33.3 g/dL (ref 30.0–36.0)
MCV: 91.6 fL (ref 78.0–100.0)
MONO ABS: 1.3 10*3/uL — AB (ref 0.1–1.0)
Monocytes Relative: 6 %
Neutro Abs: 14.5 10*3/uL — ABNORMAL HIGH (ref 1.7–7.7)
Neutrophils Relative %: 71 %
PLATELETS: 298 10*3/uL (ref 150–400)
RBC: 4.98 MIL/uL (ref 3.87–5.11)
RDW: 13.6 % (ref 11.5–15.5)
WBC: 20.4 10*3/uL — ABNORMAL HIGH (ref 4.0–10.5)

## 2017-08-30 LAB — BASIC METABOLIC PANEL
Anion gap: 10 (ref 5–15)
BUN: 14 mg/dL (ref 6–20)
CO2: 29 mmol/L (ref 22–32)
Calcium: 8.7 mg/dL — ABNORMAL LOW (ref 8.9–10.3)
Chloride: 99 mmol/L (ref 98–111)
Creatinine, Ser: 0.7 mg/dL (ref 0.44–1.00)
GFR calc Af Amer: >60 mL/min (ref >60)
GLUCOSE: 101 mg/dL — AB (ref 70–99)
POTASSIUM: 3.8 mmol/L (ref 3.5–5.1)
Sodium: 138 mmol/L (ref 135–145)

## 2017-08-30 LAB — TROPONIN I
Troponin I: <0.03 ng/mL (ref <0.03)
Troponin I: <0.03 ng/mL (ref <0.03)

## 2017-08-30 NOTE — Progress Notes (Signed)
   08/30/17 1055  Vitals  BP (!) 147/110  MAP (mmHg) 122  BP Location Left Arm  BP Method Automatic  Patient Position (if appropriate) Lying  Pulse Rate (!) 101  Pulse Rate Source Monitor  Resp (!) 22  Oxygen Therapy  SpO2 91 %  O2 Device Nasal Cannula  O2 Flow Rate (L/min) 4 L/min   Pt C/O progressively worsening shortness of breath and chest tightness. Vitals obtained, MD made aware. MD put in for STAT EKG and chest x-ray. O2 increased to 4L with sat's still in the low 90's. Will continue to monitor.

## 2017-08-30 NOTE — Progress Notes (Signed)
Subjective: She is been off oxygen for most of the day and has done well.  She said her O2 saturation dropped in the 87 range while she was asleep last night.  She thinks she may have some element of sleep apnea.  She is still coughing nonproductively.  She is been walking in the halls.  She says she wants to go home.  Objective: Vital signs in last 24 hours: Temp:  [98.1 F (36.7 C)-98.9 F (37.2 C)] 98.9 F (37.2 C) (08/25 0601) Pulse Rate:  [59-85] 70 (08/25 0601) Resp:  [17-19] 19 (08/25 0601) BP: (113-138)/(73-85) 127/83 (08/25 0601) SpO2:  [89 %-96 %] 91 % (08/25 0714) Weight change:  Last BM Date: 08/26/17  Intake/Output from previous day: 08/24 0701 - 08/25 0700 In: 720 [P.O.:720] Out: -   PHYSICAL EXAM General appearance: alert, cooperative, no distress and morbidly obese Resp: clear to auscultation bilaterally Cardio: regular rate and rhythm, S1, S2 normal, no murmur, click, rub or gallop GI: soft, non-tender; bowel sounds normal; no masses,  no organomegaly Extremities: extremities normal, atraumatic, no cyanosis or edema  Lab Results:  Results for orders placed or performed during the hospital encounter of 08/25/17 (from the past 48 hour(s))  Glucose, capillary     Status: Abnormal   Collection Time: 08/28/17 11:23 AM  Result Value Ref Range   Glucose-Capillary 183 (H) 70 - 99 mg/dL  Glucose, capillary     Status: Abnormal   Collection Time: 08/28/17  4:21 PM  Result Value Ref Range   Glucose-Capillary 307 (H) 70 - 99 mg/dL  Glucose, capillary     Status: Abnormal   Collection Time: 08/28/17  9:34 PM  Result Value Ref Range   Glucose-Capillary 189 (H) 70 - 99 mg/dL   Comment 1 Notify RN    Comment 2 Document in Chart     ABGS No results for input(s): PHART, PO2ART, TCO2, HCO3 in the last 72 hours.  Invalid input(s): PCO2 CULTURES Recent Results (from the past 240 hour(s))  MRSA PCR Screening     Status: None   Collection Time: 08/25/17 10:50 PM   Result Value Ref Range Status   MRSA by PCR NEGATIVE NEGATIVE Final    Comment:        The GeneXpert MRSA Assay (FDA approved for NASAL specimens only), is one component of a comprehensive MRSA colonization surveillance program. It is not intended to diagnose MRSA infection nor to guide or monitor treatment for MRSA infections. Performed at [facility], [address] [place], [place] [address]    Studies/Results: Dg Chest Port 1 View  Result Date: 08/28/2017 CLINICAL DATA:  Shortness of breath and chest tightness EXAM: PORTABLE CHEST 1 VIEW COMPARISON:  Chest radiograph August 25, 2017 and chest CT August 26, 2017 FINDINGS: There is patchy airspace consolidation in each lung base. Lungs elsewhere are clear. Heart is upper normal in size with mild pulmonary venous hypertension. No adenopathy. No bone lesions. IMPRESSION: Airspace consolidation in the bases, more on the left than on the right, likely multifocal pneumonia. Appearance similar to 2 days prior. There is pulmonary vascular congestion.  No interstitial edema. Electronically Signed   By: Lowella Grip III M.D.   On: 08/28/2017 09:09    Medications:  Prior to Admission:  Medications Prior to Admission  Medication Sig Dispense Refill Last Dose  . albuterol (PROVENTIL) (5 MG/ML) 0.5% nebulizer solution Take 0.5 mLs (2.5 mg total) by nebulization every 6 (six) hours as needed for wheezing or shortness  of breath. 20 mL 0 08/25/2017 at Unknown time  . allopurinol (ZYLOPRIM) 300 MG tablet Take 300 mg by mouth every evening.    08/25/2017 at Unknown time  . azithromycin (ZITHROMAX) 250 MG tablet Take 250-500 mg by mouth See admin instructions. 519m on day 1 then take 2577mon days 2 through 5 starting on 08/24/2017  0 08/25/2017 at Unknown time  . budesonide-formoterol (SYMBICORT) 160-4.5 MCG/ACT inhaler Inhale 1 puff into the lungs 2 (two) times daily.   08/25/2017 at Unknown time  . Calcium Carb-Cholecalciferol [phone] MG-UNIT  CAPS Take 2 capsules by mouth every evening.   08/24/2017 at Unknown time  . fexofenadine (ALLEGRA) 180 MG tablet Take 180 mg by mouth every evening.   08/24/2017 at Unknown time  . fluticasone (FLONASE) 50 MCG/ACT nasal spray Place 2 sprays into both nostrils every evening.   08/24/2017 at Unknown time  . ibuprofen (ADVIL,MOTRIN) 200 MG tablet Take 800 mg by mouth every 6 (six) hours as needed for mild pain or moderate pain.   Past Week at Unknown time  . ipratropium (ATROVENT) 0.02 % nebulizer solution Take 2.5 mLs (0.5 mg total) by nebulization 4 (four) times daily. 75 mL 0 08/25/2017 at Unknown time  . KOMBIGLYZE XR 2.05-998 MG TB24 Take 1 tablet by mouth every evening.   11 08/24/2017 at Unknown time  . pantoprazole (PROTONIX) 40 MG tablet Take 40-80 mg by mouth every evening.   11 08/24/2017 at Unknown time  . [EXPIRED] predniSONE (DELTASONE) 20 MG tablet Take 3 tablets (60 mg total) by mouth daily for 5 days. 15 tablet 0 08/25/2017 at Unknown time  . rizatriptan (MAXALT) 5 MG tablet Take 5 mg by mouth as needed for migraine. May repeat in 2 hours if needed   unknown   Scheduled: . acetylcysteine  3 mL Nebulization Q6H  . azithromycin  500 mg Oral QHS  . enoxaparin (LOVENOX) injection  40 mg Subcutaneous Q24H  . insulin aspart  0-9 Units Subcutaneous TID WC  . ipratropium-albuterol  3 mL Nebulization Q6H  . methylPREDNISolone (SOLU-MEDROL) injection  40 mg Intravenous Q24H  . nicotine  14 mg Transdermal Daily  . pantoprazole  40 mg Oral Daily  . sodium chloride flush  3 mL Intravenous Q12H   Continuous: . sodium chloride    . cefTRIAXone (ROCEPHIN)  IV Stopped (08/29/17 2022)   PRSUO:RVIFBPhloride, albuterol, sodium chloride flush  Assesment: She was admitted with acute bronchospasm.  Her CT was read as atelectasis but clinically I think she has pneumonia.  She has morbid obesity and she may have some element of sleep apnea.  She had acute hypoxic respiratory failure which is much  improved. Principal Problem:   Bronchospasm, acute Active Problems:   Diabetes mellitus type 2 in obese (HCC)   Morbid obesity (HCC)   Polycystic ovary disease   Tobacco abuse   SOB (shortness of breath)   [facility]   Plan: She wants to go home.  I think that is reasonable on antibiotics and oral steroids.  She needs a sleep study as an outpatient.    LOS: 5 days   Medhansh Brinkmeier L 08/30/2017, 7:26 AM

## 2017-08-30 NOTE — Progress Notes (Signed)
PROGRESS NOTE    Cheryl Black  EGB:[id number] DOB: Jan 29, 1985 DOA: 08/25/2017 PCP: [name], MD     Brief Narrative:  32 year old woman admitted from home on 8/20 due to shortness of breath.  She has a history significant for type 2 diabetes, PCOS, morbid obesity and tobacco abuse.  She was seen in the ED on August 18 and was given albuterol and prednisone.  Because of lack of improvement she then saw her PCP yesterday who added Spiriva and oral azithromycin.  Due to continued lack of improvement she came into the ED on day of admission.  She did require BiPAP upon arrival to the ED and admission was requested.   Assessment & Plan:   Principal Problem:   Bronchospasm, acute Active Problems:   Diabetes mellitus type 2 in obese (HCC)   Morbid obesity (HCC)   Polycystic ovary disease   Tobacco abuse   SOB (shortness of breath)   Acute respiratory failure (HCC)   Acute hypoxemic respiratory failure -She has not required BIPAP past 48 hours. -CT shows no evidence of PE, however she does have significant bilateral lower lobe atelectasis and right middle lobe atelectasis, radiologist is also concerned about a component of superimposed pneumonia.  There is no evidence of pulmonary edema. -No significant wheezing on exam. -Continue rocephin and azithro to cover community-acquired pneumonia. -Continue incentive spirometer. -Breathing treatments as needed.  Continue weaning steroids due to lack of wheezing on auscultation.. -Given long-standing tobacco abuse, would benefit from PFTs post discharge. -Would also benefit from sleep study as an outpatient given her morbid obesity and probability of sleep apnea/OHS. -Getting ECHO and alpha 1 antitrypsin level per Dr. Luan Pulling recommendations. -Continue to try and wean oxygen. -Was going to be discharged on 8/25, however had a sudden episode of increased chest tightness and hypoxemia.  Chest x-ray does not show any changes from prior, EKG  without acute ischemic changes and a negative troponin.  I do believe that she has a component of anxiety.  Her sats have subsequently improved to the mid 90s.  We will try and continue to wean oxygen today  Tobacco abuse -Tobacco cessation was discussed with the patient on 8/21.  Morbid obesity -Noted, counseled on lifestyle modifications.  Type 2 diabetes -CBGs have been elevated, likely due to steroid use, but improving with steroid titration. -A1c is elevated at 7.1. -Continue sliding scale.    DVT prophylaxis: Lovenox Code Status: Full code Family Communication: Husband at bedside updated on plan of care and all questions answered on 8/22. Disposition Plan: continue to wean oxygen, anticipate discharge home in 24 to 48 hours.  Consultants:   Pulmonology  Procedures:   ECHO on 8/23: Ejection fraction of 60 to 65% with normal wall motion and normal left ventricular diastolic function parameters.  Antimicrobials:  Anti-infectives (From admission, onward)   Start     Dose/Rate Route Frequency Ordered Stop   08/29/17 2200  azithromycin (ZITHROMAX) tablet 500 mg     500 mg Oral Daily at bedtime 08/29/17 0908     08/26/17 2000  cefTRIAXone (ROCEPHIN) 1 g in sodium chloride 0.9 % 100 mL IVPB     1 g 200 mL/hr over 30 Minutes Intravenous Every 24 hours 08/26/17 1700     08/25/17 2000  cefTRIAXone (ROCEPHIN) 1 g in sodium chloride 0.9 % 100 mL IVPB     1 g 200 mL/hr over 30 Minutes Intravenous  Once 08/25/17 1945 08/25/17 2040   08/25/17 2000  azithromycin (  ZITHROMAX) 500 mg in sodium chloride 0.9 % 250 mL IVPB  Status:  Discontinued     500 mg 250 mL/hr over 60 Minutes Intravenous Every 24 hours 08/25/17 1945 08/29/17 0908       Subjective: Lying in bed, was doing well this morning and in fact had discussed discharge home with Dr. Luan Pulling today.  Later on in the morning had a sudden episode of chest tightness and shortness of breath.  Please see above for details.   Subsequently this has resolved and she is back at baseline.  Objective: Vitals:   08/30/17 1024 08/30/17 1055 08/30/17 1438 08/30/17 1506  BP: (!) 149/99 (!) 147/110  124/74  Pulse: 79 (!) 101  84  Resp:  (!) 22  18  Temp: 98.3 F (36.8 C)   98.1 F (36.7 C)  TempSrc: Oral   Oral  SpO2: 92% 91% 96% 91%  Weight:      Height:        Intake/Output Summary (Last 24 hours) at 08/30/2017 1536 Last data filed at 08/30/2017 1200 Gross per 24 hour  Intake 720 ml  Output -  Net 720 ml   Filed Weights   08/25/17 2303 08/26/17 1434 08/27/17 0406  Weight: (!) 137.8 kg 132.4 kg (!) 138.9 kg    Examination:  General exam: Alert, awake, oriented x 3, morbidly obese Respiratory system: Bibasilar crackles respiratory effort normal. Cardiovascular system:RRR. No murmurs, rubs, gallops. Gastrointestinal system: Abdomen is nondistended, soft and nontender. No organomegaly or masses felt. Normal bowel sounds heard. Central nervous system: Alert and oriented. No focal neurological deficits. Extremities: No C/C/E, +pedal pulses Skin: No rashes, lesions or ulcers Psychiatry: Judgement and insight appear normal. Mood & affect appropriate.        Data Reviewed: I have personally reviewed following labs and imaging studies  CBC: Recent Labs  Lab 08/23/17 1647 08/25/17 1928 08/26/17 0406 08/27/17 0357 08/28/17 0433 08/30/17 0632  WBC 11.3* 14.7* 23.0* 23.8* 14.1* 20.4*  NEUTROABS 6.9 12.9  --   --   --  14.5*  HGB 16.1* 15.7* 15.1* 15.0 14.2 15.2*  HCT 46.6* 46.5* 45.2 45.4 44.2 45.6  MCV 90.5 91.2 92.8 92.1 96.3 91.6  PLT 259 248 285 300 201 818   Basic Metabolic Panel: Recent Labs  Lab 08/23/17 1647 08/25/17 2023 08/26/17 0406 08/28/17 0433 08/30/17 0632  NA 137 133* 136 138 138  K 3.7 3.9 4.2 3.6 3.8  CL 105 102 104 105 99  CO2 24 16* 20* 23 29  GLUCOSE 149* 386* 275* 122* 101*  BUN 9 13 11 17 14   CREATININE 0.69 0.86 0.74 0.63 0.70  CALCIUM 8.8* 8.5* 8.5* 8.1* 8.7*     GFR: Estimated Creatinine Clearance: 137.4 mL/min (by C-G formula based on SCr of 0.7 mg/dL). Liver Function Tests: No results for input(s): AST, ALT, ALKPHOS, BILITOT, PROT, ALBUMIN in the last 168 hours. No results for input(s): LIPASE, AMYLASE in the last 168 hours. No results for input(s): AMMONIA in the last 168 hours. Coagulation Profile: No results for input(s): INR, PROTIME in the last 168 hours. Cardiac Enzymes: Recent Labs  Lab 08/25/17 1928 08/30/17 1118  TROPONINI <0.03 <0.03   BNP (last 3 results) No results for input(s): PROBNP in the last 8760 hours. HbA1C: No results for input(s): HGBA1C in the last 72 hours. CBG: Recent Labs  Lab 08/27/17 2123 08/28/17 0726 08/28/17 1123 08/28/17 1621 08/28/17 2134  GLUCAP 321* 135* 183* 307* 189*   Lipid Profile: No results  for input(s): CHOL, HDL, LDLCALC, TRIG, CHOLHDL, LDLDIRECT in the last 72 hours. Thyroid Function Tests: No results for input(s): TSH, T4TOTAL, FREET4, T3FREE, THYROIDAB in the last 72 hours. Anemia Panel: No results for input(s): VITAMINB12, FOLATE, FERRITIN, TIBC, IRON, RETICCTPCT in the last 72 hours. Urine analysis:    Component Value Date/Time   COLORURINE YELLOW 08/17/2012 [place] 08/17/2012 0339   LABSPEC 1.020 08/17/2012 0339   PHURINE 5.5 08/17/2012 0339   GLUCOSEU >1000 (A) 08/17/2012 0339   HGBUR TRACE (A) 08/17/2012 0339   BILIRUBINUR NEGATIVE 08/17/2012 0339   KETONESUR NEGATIVE 08/17/2012 0339   PROTEINUR NEGATIVE 08/17/2012 0339   UROBILINOGEN 0.2 08/17/2012 0339   NITRITE NEGATIVE 08/17/2012 0339   LEUKOCYTESUR NEGATIVE 08/17/2012 0339   Sepsis Labs: @LABRCNTIP (procalcitonin:4,lacticidven:4)  ) Recent Results (from the past 240 hour(s))  MRSA PCR Screening     Status: None   Collection Time: 08/25/17 10:50 PM  Result Value Ref Range Status   MRSA by PCR NEGATIVE NEGATIVE Final    Comment:        The GeneXpert MRSA Assay (FDA approved for NASAL  specimens only), is one component of a comprehensive MRSA colonization surveillance program. It is not intended to diagnose MRSA infection nor to guide or monitor treatment for MRSA infections. Performed at [facility], [address], [place], [place] [address]          Radiology Studies: Dg Chest [facility] 1 View  Result Date: 08/30/2017 CLINICAL DATA:  Shortness of breath. EXAM: PORTABLE CHEST 1 VIEW COMPARISON:  Radiograph of August 28, 2017. FINDINGS: Stable cardiomediastinal silhouette and central pulmonary vascular congestion is noted. No pneumothorax is noted. Stable bibasilar pneumonia or atelectasis is noted with associated small effusions. Bony thorax is unremarkable. IMPRESSION: Stable central pulmonary vascular congestion. Stable bibasilar pneumonia or atelectasis is noted with small associated pleural effusions. Electronically Signed   By: Marijo Conception, M.D.   On: 08/30/2017 11:34        Scheduled Meds: . acetylcysteine  3 mL Nebulization Q6H  . azithromycin  500 mg Oral QHS  . enoxaparin (LOVENOX) injection  40 mg Subcutaneous Q24H  . insulin aspart  0-9 Units Subcutaneous TID WC  . ipratropium-albuterol  3 mL Nebulization Q6H  . methylPREDNISolone (SOLU-MEDROL) injection  40 mg Intravenous Q24H  . nicotine  14 mg Transdermal Daily  . pantoprazole  40 mg Oral Daily  . sodium chloride flush  3 mL Intravenous Q12H   Continuous Infusions: . sodium chloride    . cefTRIAXone (ROCEPHIN)  IV Stopped (08/29/17 2022)     LOS: 5 days    Time spent: 35 minutes. Greater than 50% of this time was spent in direct contact with the patient and with patient's husband, coordinating care and discussing relevant ongoing clinical issues, including plan to work-up and results for her acute respiratory distress.    Lelon Frohlich, MD [facility] Pager [phone]  If 7PM-7AM, please contact night-coverage [email or web address] Password [facility] 08/30/2017, 3:36 PM

## 2017-08-31 LAB — GLUCOSE, CAPILLARY
GLUCOSE-CAPILLARY: 185 mg/dL — AB (ref 70–99)
GLUCOSE-CAPILLARY: 228 mg/dL — AB (ref 70–99)
GLUCOSE-CAPILLARY: 344 mg/dL — AB (ref 70–99)
GLUCOSE-CAPILLARY: 226 mg/dL — AB (ref 70–99)
GLUCOSE-CAPILLARY: 201 mg/dL — AB (ref 70–99)
GLUCOSE-CAPILLARY: 333 mg/dL — AB (ref 70–99)
Glucose-Capillary: 119 mg/dL — ABNORMAL HIGH (ref 70–99)
Glucose-Capillary: 253 mg/dL — ABNORMAL HIGH (ref 70–99)
Glucose-Capillary: 326 mg/dL — ABNORMAL HIGH (ref 70–99)
Glucose-Capillary: 219 mg/dL — ABNORMAL HIGH (ref 70–99)

## 2017-08-31 LAB — ALPHA-1 ANTITRYPSIN PHENOTYPE: A-1 Antitrypsin, Ser: 148 mg/dL (ref 90–200)

## 2017-08-31 LAB — TROPONIN I: Troponin I: <0.03 ng/mL (ref <0.03)

## 2017-08-31 MED ORDER — NICOTINE 14 MG/24HR TD PT24: 14.0000 mg | MEDICATED_PATCH | Freq: Every day | TRANSDERMAL | 0 refills | Status: DC

## 2017-08-31 MED ORDER — PREDNISONE 10 MG PO TABS: 10.0000 mg | ORAL_TABLET | Freq: Every day | ORAL | 0 refills | Status: DC

## 2017-08-31 MED ORDER — IPRATROPIUM-ALBUTEROL 0.5-2.5 (3) MG/3ML IN SOLN: 3.0000 mL | Freq: Four times a day (QID) | RESPIRATORY_TRACT | 2 refills | Status: DC

## 2017-08-31 MED ORDER — BUDESONIDE-FORMOTEROL FUMARATE 160-4.5 MCG/ACT IN AERO: 1.0000 | INHALATION_SPRAY | Freq: Two times a day (BID) | RESPIRATORY_TRACT | 2 refills | Status: DC

## 2017-08-31 MED ORDER — PANTOPRAZOLE SODIUM 40 MG PO TBEC: 40.0000 mg | DELAYED_RELEASE_TABLET | Freq: Every day | ORAL | 2 refills | Status: DC

## 2017-08-31 MED ORDER — AMOXICILLIN-POT CLAVULANATE 875-125 MG PO TABS: 1.0000 | ORAL_TABLET | Freq: Two times a day (BID) | ORAL | 0 refills | Status: AC

## 2017-08-31 MED ORDER — ALBUTEROL SULFATE HFA 108 (90 BASE) MCG/ACT IN AERS: 2.0000 | INHALATION_SPRAY | Freq: Four times a day (QID) | RESPIRATORY_TRACT | 2 refills | Status: DC | PRN

## 2017-08-31 NOTE — Discharge Summary (Signed)
Physician Discharge Summary  Cheryl Black XBM:[id number] DOB: May 09, 1985 DOA: 08/25/2017  PCP: [name], MD  Admit date: 08/25/2017 Discharge date: 08/31/2017  Time spent: 45 minutes  Recommendations for Outpatient Follow-up:  -Will be discharged home today. -Advised to follow up with PCP in 1 wee. -Will need to see Dr. Luan Pulling in 2-3 weeks. At that time should try and arrange to have sleep study done.   Discharge Diagnoses:  Principal Problem:   Bronchospasm, acute Active Problems:   Diabetes mellitus type 2 in obese [facility])   Morbid obesity (HCC)   Polycystic ovary disease   Tobacco abuse   SOB (shortness of breath)   Acute respiratory failure ([place])   Discharge Condition: Stable and improved  Filed Weights   08/25/17 2303 08/26/17 1434 08/27/17 0406  Weight: (!) 137.8 kg 132.4 kg (!) 138.9 kg    History of present illness:  As per Dr. Shanon Brow on 8/20: Cheryl Black is a 32 y.o. female with medical history significant of diabetes, PCOS, obesity, pack a day smoker with no prior history of asthma or COPD comes in with several days of shortness of breath and wheezing at home.  Patient was seen here in the ED on August 18 and given oral steroids and albuterol to take at home.  She did this around the clock was not getting any better she saw her primary care physician yesterday who added on azithromycin orally and Spiriva inhaler.  Patient reports she continued to still not get better.  She denies any fevers but says that she is having a spasmy cough.  She denies any lower extremity swelling or edema.  She has no history of VT and has not really had any recent traveling.  She is not on any estrogen medications.  She does not have chest pain but reports some right sided posterior pain when taking a deep breath.  Patient is being referred for admission for bronchospasm.  She is been on BiPAP for the last couple of hours and she feels like this is helping her.  Per respiratory  therapy she is wheezing less.  Patient be referred for admission for acute respiratory failure requiring BiPAP support.  Hospital Course:   Acute hypoxemic respiratory failure -She has not required BIPAP past 72 hours. -CT shows no evidence of PE, however she does have significant bilateral lower lobe atelectasis and right middle lobe atelectasis, radiologist is also concerned about a component of superimposed pneumonia.  There is no evidence of pulmonary edema. -No significant wheezing on exam. -Transition abx over to augmentin for 3 more days on DC. -Continue incentive spirometer and flutter valve. -DC on a prednisone taper. Albuterol and symbicort. -Given long-standing tobacco abuse, would benefit from PFTs post discharge. -Would also benefit from sleep study as an outpatient given her morbid obesity and probability of sleep apnea/OHS. -Getting alpha 1 antitrypsin level per Dr. Luan Pulling recommendations. Pending on DC. -Currently without oxygen requirements.  Tobacco abuse -Tobacco cessation was discussed with the patient on 8/21. -She would like to be discharged with nicotine patches.  Morbid obesity -Noted, counseled on lifestyle modifications.  Type 2 diabetes -CBGs have been elevated, likely due to steroid use, but improving with steroid titration. -A1c is elevated at 7.1. -Close OP follow up.   Procedures:  None   Consultations:  Pulmonary, Dr. Luan Pulling  Discharge Instructions  Discharge Instructions    Diet - low sodium heart healthy   Complete by:  As directed  Increase activity slowly   Complete by:  As directed      Allergies as of 08/31/2017      Reactions   Ace Inhibitors Anaphylaxis   Beta Adrenergic Blockers Other (See Comments)   Angioedema   Augmentin [amoxicillin-pot Clavulanate] Other (See Comments)   Has had C.diff in the past   Benicar [olmesartan] Other (See Comments)   Facial edema   Doxycycline Other (See Comments)   Makes feet and  hands red   Influenza Vaccine Live Swelling   Arm    Invokana [canagliflozin] Other (See Comments)   Dehydration      Medication List    STOP taking these medications   azithromycin 250 MG tablet Commonly known as:  ZITHROMAX   ipratropium 0.02 % nebulizer solution Commonly known as:  ATROVENT     TAKE these medications   albuterol (5 MG/ML) 0.5% nebulizer solution Commonly known as:  PROVENTIL Take 0.5 mLs (2.5 mg total) by nebulization every 6 (six) hours as needed for wheezing or shortness of breath. What changed:  Another medication with the same name was added. Make sure you understand how and when to take each.   albuterol 108 (90 Base) MCG/ACT inhaler Commonly known as:  PROVENTIL HFA;VENTOLIN HFA Inhale 2 puffs into the lungs every 6 (six) hours as needed for wheezing or shortness of breath. What changed:  You were already taking a medication with the same name, and this prescription was added. Make sure you understand how and when to take each.   allopurinol 300 MG tablet Commonly known as:  ZYLOPRIM Take 300 mg by mouth every evening.   amoxicillin-clavulanate 875-125 MG tablet Commonly known as:  AUGMENTIN Take 1 tablet by mouth 2 (two) times daily for 3 days.   budesonide-formoterol 160-4.5 MCG/ACT inhaler Commonly known as:  SYMBICORT Inhale 1 puff into the lungs 2 (two) times daily. What changed:  when to take this   Calcium Carb-Cholecalciferol [phone] MG-UNIT Caps Take 2 capsules by mouth every evening.   fexofenadine 180 MG tablet Commonly known as:  ALLEGRA Take 180 mg by mouth every evening.   fluticasone 50 MCG/ACT nasal spray Commonly known as:  FLONASE Place 2 sprays into both nostrils every evening.   ibuprofen 200 MG tablet Commonly known as:  ADVIL,MOTRIN Take 800 mg by mouth every 6 (six) hours as needed for mild pain or moderate pain.   ipratropium-albuterol 0.5-2.5 (3) MG/3ML Soln Commonly known as:  DUONEB Take 3 mLs by  nebulization every 6 (six) hours.   KOMBIGLYZE XR 2.05-998 MG Tb24 Generic drug:  Saxagliptin-Metformin Take 1 tablet by mouth every evening.   nicotine 14 mg/24hr patch Commonly known as:  NICODERM CQ - dosed in mg/24 hours Place 1 patch (14 mg total) onto the skin daily. Start taking on:  09/01/2017   pantoprazole 40 MG tablet Commonly known as:  PROTONIX Take 1 tablet (40 mg total) by mouth daily. Start taking on:  09/01/2017 What changed:    how much to take  when to take this   predniSONE 10 MG tablet Commonly known as:  DELTASONE Take 1 tablet (10 mg total) by mouth daily with breakfast. Take 6 tablets today and then decrease by 1 tablet daily until none are left. What changed:    medication strength  how much to take  when to take this  additional instructions   rizatriptan 5 MG tablet Commonly known as:  MAXALT Take 5 mg by mouth as needed for migraine. May repeat  in 2 hours if needed      Allergies  Allergen Reactions  . Ace Inhibitors Anaphylaxis  . Beta Adrenergic Blockers Other (See Comments)    Angioedema   . Augmentin [Amoxicillin-Pot Clavulanate] Other (See Comments)    Has had C.diff in the past  . Benicar [Olmesartan] Other (See Comments)    Facial edema  . Doxycycline Other (See Comments)    Makes feet and hands red  . Influenza Vaccine Live Swelling    Arm   . Invokana [Canagliflozin] Other (See Comments)    Dehydration    Follow-up Information    [name], MD. Schedule an appointment as soon as possible for a visit in 1 week(s).   Specialty:  Internal Medicine Contact information: [address] [place] [facility] [address] [phone]        Sinda Du, MD. Schedule an appointment as soon as possible for a visit in 3 week(s).   Specialty:  Pulmonary Disease Contact information: [place] [address] [phone]            The results of significant diagnostics from this  hospitalization (including imaging, microbiology, ancillary and laboratory) are listed below for reference.    Significant Diagnostic Studies: Dg Chest 2 View  Result Date: 08/23/2017 CLINICAL DATA:  Wheezing and shortness of breath EXAM: CHEST - 2 VIEW COMPARISON:  October 21, 2016 FINDINGS: There is no evident edema or consolidation. Heart size and pulmonary vascularity are normal. No adenopathy. No pneumothorax. No bone lesions. IMPRESSION: No edema or consolidation. Electronically Signed   By: Lowella Grip III M.D.   On: 08/23/2017 18:19   Ct Angio Chest Pe W Or Wo Contrast  Result Date: 08/26/2017 CLINICAL DATA:  Central chest lpain since x 3 days. Radiating into RUQ of abdomen/lower lung. Hx of dm, pcos,ks, fatty liver, cholecystectomyWorsening of symptoms in last 2 hrs. EXAM: CT ANGIOGRAPHY CHEST WITH CONTRAST TECHNIQUE: Multidetector CT imaging of the chest was performed using the standard protocol during bolus administration of intravenous contrast. Multiplanar CT image reconstructions and MIPs were obtained to evaluate the vascular anatomy. CONTRAST:  130m ISOVUE-370 IOPAMIDOL (ISOVUE-370) INJECTION 76% COMPARISON:  Chest radiograph, 08/25/2017. FINDINGS: Cardiovascular: There is satisfactory opacification of the pulmonary arteries to the segmental level. There is no evidence of a pulmonary embolus. Heart is normal in size and configuration. No pericardial effusion. No coronary artery calcifications. Great vessels are normal in caliber. No aortic dissection or atherosclerosis. Mediastinum/Nodes: No enlarged mediastinal, hilar, or axillary lymph nodes. Thyroid gland, trachea, and esophagus demonstrate no significant findings. Lungs/Pleura: Study appears expiratory. There is significant atelectasis both lower lobes. Milder atelectasis is noted at the base of the right middle lobe. Remainder of the lungs is clear. No pleural effusion. No pneumothorax. Upper Abdomen: No acute abnormality.  Musculoskeletal: No chest wall abnormality. No acute or significant osseous findings. Review of the MIP images confirms the above findings. IMPRESSION: 1. No evidence of a pulmonary embolism. 2. There is significant lower lobe atelectasis bilaterally. There is milder right middle lobe atelectasis. Consider a component of superimposed pneumonia if there are consistent clinical findings. No evidence of pulmonary edema. Electronically Signed   By: DLajean ManesM.D.   On: 08/26/2017 16:38   Dg Chest Port 1 View  Result Date: 08/30/2017 CLINICAL DATA:  Shortness of breath. EXAM: PORTABLE CHEST 1 VIEW COMPARISON:  Radiograph of August 28, 2017. FINDINGS: Stable cardiomediastinal silhouette and central pulmonary vascular congestion is noted. No pneumothorax  is noted. Stable bibasilar pneumonia or atelectasis is noted with associated small effusions. Bony thorax is unremarkable. IMPRESSION: Stable central pulmonary vascular congestion. Stable bibasilar pneumonia or atelectasis is noted with small associated pleural effusions. Electronically Signed   By: Marijo Conception, M.D.   On: 08/30/2017 11:34   Dg Chest Port 1 View  Result Date: 08/28/2017 CLINICAL DATA:  Shortness of breath and chest tightness EXAM: PORTABLE CHEST 1 VIEW COMPARISON:  Chest radiograph August 25, 2017 and chest CT August 26, 2017 FINDINGS: There is patchy airspace consolidation in each lung base. Lungs elsewhere are clear. Heart is upper normal in size with mild pulmonary venous hypertension. No adenopathy. No bone lesions. IMPRESSION: Airspace consolidation in the bases, more on the left than on the right, likely multifocal pneumonia. Appearance similar to 2 days prior. There is pulmonary vascular congestion.  No interstitial edema. Electronically Signed   By: Lowella Grip III M.D.   On: 08/28/2017 09:09   Dg Chest Port 1 View  Result Date: 08/25/2017 CLINICAL DATA:  Increasing shortness of breath since 08/23/2017. EXAM: PORTABLE  CHEST 1 VIEW COMPARISON:  PA and lateral chest 08/23/2017 and 10/21/2016. FINDINGS: Mild subsegmental atelectasis. There is some peribronchial thickening. No consolidative process is seen lung bases, pneumothorax or effusion. Heart size is normal. No acute or focal bony abnormality. IMPRESSION: Mild bronchitic change without consolidative process. Electronically Signed   By: Inge Rise M.D.   On: 08/25/2017 19:51    Microbiology: Recent Results (from the past 240 hour(s))  MRSA PCR Screening     Status: None   Collection Time: 08/25/17 10:50 PM  Result Value Ref Range Status   MRSA by PCR NEGATIVE NEGATIVE Final    Comment:        The GeneXpert MRSA Assay (FDA approved for NASAL specimens only), is one component of a comprehensive MRSA colonization surveillance program. It is not intended to diagnose MRSA infection nor to guide or monitor treatment for MRSA infections. Performed at [facility], [address], [place], [place] [address]      Labs: Basic Metabolic Panel: Recent Labs  Lab 08/25/17 2023 08/26/17 0406 08/28/17 0433 08/30/17 0632  NA 133* 136 138 138  K 3.9 4.2 3.6 3.8  CL 102 104 105 99  CO2 16* 20* 23 29  GLUCOSE 386* 275* 122* 101*  BUN 13 11 17 14   CREATININE 0.86 0.74 0.63 0.70  CALCIUM 8.5* 8.5* 8.1* 8.7*   Liver Function Tests: No results for input(s): AST, ALT, ALKPHOS, BILITOT, PROT, ALBUMIN in the last 168 hours. No results for input(s): LIPASE, AMYLASE in the last 168 hours. No results for input(s): AMMONIA in the last 168 hours. CBC: Recent Labs  Lab 08/25/17 1928 08/26/17 0406 08/27/17 0357 08/28/17 0433 08/30/17 0632  WBC 14.7* 23.0* 23.8* 14.1* 20.4*  NEUTROABS 12.9  --   --   --  14.5*  HGB 15.7* 15.1* 15.0 14.2 15.2*  HCT 46.5* 45.2 45.4 44.2 45.6  MCV 91.2 92.8 92.1 96.3 91.6  PLT 248 285 300 201 298   Cardiac Enzymes: Recent Labs  Lab 08/25/17 1928 08/30/17 1118 08/30/17 1744 08/30/17 2317  TROPONINI <0.03 <0.03  <0.03 <0.03   BNP: BNP (last 3 results) No results for input(s): BNP in the last 8760 hours.  ProBNP (last 3 results) No results for input(s): PROBNP in the last 8760 hours.  CBG: Recent Labs  Lab 08/30/17 0753 08/30/17 1129 08/30/17 1637 08/30/17 2137 08/31/17 0731  GLUCAP 219* 228* 344*  226* 201*       Signed:  [facility] Pager: [phone] 08/31/2017, 11:11 AM

## 2017-08-31 NOTE — Progress Notes (Signed)
Inpatient Diabetes Program Recommendations  AACE/ADA: New Consensus Statement on Inpatient Glycemic Control (2015)  Target Ranges:  Prepandial:   less than 140 mg/dL      Peak postprandial:   less than 180 mg/dL (1-2 hours)      Critically ill patients:  140 - 180 mg/dL   Lab Results  Component Value Date   GLUCAP 201 (H) 08/31/2017   HGBA1C 7.1 (H) 08/25/2017    Review of Glycemic Control Results for Aris LotBELTON, Thersia A (MRN [id number]) as of 08/31/2017 10:48  Ref. Range 08/30/2017 07:53 08/30/2017 11:29 08/30/2017 16:37 08/30/2017 21:37 08/31/2017 07:31  Glucose-Capillary Latest Ref Range: 70 - 99 mg/dL 782219 (H) 956228 (H) 213344 (H) 226 (H) 201 (H)   Diabetes history: DM2 Outpatient Diabetes medications: Kombiglyze (Metformin + Saxagliptin) Current orders for Inpatient glycemic control: Novolog sensitive correction q 4 hrs + steroids*  Inpatient Diabetes Program Recommendations:   While in the hospital on steroids: -Lantus 20 units daily (0.2 x 137 kg would equal 27 units) -Increase Novolog correction to moderate q 4 hrs.  Thank you, Billy FischerJudy E. Bevin Mayall, RN, MSN, CDE  Diabetes Coordinator Inpatient Glycemic Control Team Team Pager [phone] (8am-5pm) 08/31/2017 10:48 AM

## 2017-08-31 NOTE — Progress Notes (Signed)
Late Entry: Patients CBG unable to transfer. CBG 226.

## 2017-08-31 NOTE — Progress Notes (Signed)
PT Cancellation Note  Patient Details Name: Cheryl Black MRN: [id number] DOB: 08/12/1985   Cancelled Treatment:    Reason Eval/Treat Not Completed: Other (comment).  Pt is expecting to leave today, and declined therapy.  Will try again tomorrow if she is held for some reason.   Ivar DrapeRuth E Bess Saltzman 08/31/2017, 11:55 AM   Samul Dadauth [place] Karl, PT MS Acute Rehab Dept. Number: [facility] [id number] and [facility] [phone]

## 2017-08-31 NOTE — Progress Notes (Signed)
Patient discharged home today per MD orders. Patient vital signs WDL. IV removed and site WDL. Discharge Instructions including follow up appointments, medications, and education reviewed with patient. Patient verbalizes understanding. Patient is walked out with staff.

## 2017-08-31 NOTE — Progress Notes (Signed)
Subjective: She says she feels okay this morning.  She had an episode of chest tightness associated with hypoxia yesterday.  Chest x-ray really shows no significant difference and may be slightly improved.  I have personally reviewed the films.  This morning she is been off oxygen up and walking and her oxygen saturations remaining above 90  Objective: Vital signs in last 24 hours: Temp:  [97.8 F (36.6 C)-98.4 F (36.9 C)] 98.4 F (36.9 C) (08/26 0608) Pulse Rate:  [64-101] 67 (08/26 0608) Resp:  [18-22] 18 (08/26 0608) BP: (124-149)/(74-110) 126/79 (08/26 0608) SpO2:  [91 %-99 %] 93 % (08/26 0748) Weight change:  Last BM Date: 08/30/17  Intake/Output from previous day: 08/25 0701 - 08/26 0700 In: 720 [P.O.:720] Out: -   PHYSICAL EXAM General appearance: alert, cooperative, no distress and morbidly obese Resp: clear to auscultation bilaterally Cardio: regular rate and rhythm, S1, S2 normal, no murmur, click, rub or gallop GI: soft, non-tender; bowel sounds normal; no masses,  no organomegaly Extremities: extremities normal, atraumatic, no cyanosis or edema  Lab Results:  Results for orders placed or performed during the hospital encounter of 08/25/17 (from the past 48 hour(s))  CBC with Differential/Platelet     Status: Abnormal   Collection Time: 08/30/17  6:32 AM  Result Value Ref Range   WBC 20.4 (H) 4.0 - 10.5 K/uL   RBC 4.98 3.87 - 5.11 MIL/uL   Hemoglobin 15.2 (H) 12.0 - 15.0 g/dL   HCT 45.6 36.0 - 46.0 %   MCV 91.6 78.0 - 100.0 fL   MCH 30.5 26.0 - 34.0 pg   MCHC 33.3 30.0 - 36.0 g/dL   RDW 13.6 11.5 - 15.5 %   Platelets 298 150 - 400 K/uL   Neutrophils Relative % 71 %   Neutro Abs 14.5 (H) 1.7 - 7.7 K/uL   Lymphocytes Relative 22 %   Lymphs Abs 4.5 (H) 0.7 - 4.0 K/uL   Monocytes Relative 6 %   Monocytes Absolute 1.3 (H) 0.1 - 1.0 K/uL   Eosinophils Relative 1 %   Eosinophils Absolute 0.1 0.0 - 0.7 K/uL   Basophils Relative 0 %   Basophils Absolute 0.0 0.0  - 0.1 K/uL    Comment: Performed at [facility], [address] [place], [place] [address]  Basic metabolic panel     Status: Abnormal   Collection Time: 08/30/17  6:32 AM  Result Value Ref Range   Sodium 138 135 - 145 mmol/L   Potassium 3.8 3.5 - 5.1 mmol/L   Chloride 99 98 - 111 mmol/L   CO2 29 22 - 32 mmol/L   Glucose, Bld 101 (H) 70 - 99 mg/dL   BUN 14 6 - 20 mg/dL   Creatinine, Ser 0.70 0.44 - 1.00 mg/dL   Calcium 8.7 (L) 8.9 - 10.3 mg/dL   GFR calc non Af Amer >60 >60 mL/min   GFR calc Af Amer >60 >60 mL/min    Comment: (NOTE) The eGFR has been calculated using the CKD EPI equation. This calculation has not been validated in all clinical situations. eGFR's persistently <60 mL/min signify possible Chronic Kidney Disease.    Anion gap 10 5 - 15    Comment: Performed at [facility], [address], [place], [place] [address]  Troponin I (q 6hr x 3)     Status: None   Collection Time: 08/30/17 11:18 AM  Result Value Ref Range   Troponin I <0.03 <0.03 ng/mL    Comment: Performed at  [facility], [address] [place], [place] [address]  Troponin I (q 6hr x 3)     Status: None   Collection Time: 08/30/17  5:44 PM  Result Value Ref Range   Troponin I <0.03 <0.03 ng/mL    Comment: Performed at [facility], [address], [place], [place] [address]  Troponin I (q 6hr x 3)     Status: None   Collection Time: 08/30/17 11:17 PM  Result Value Ref Range   Troponin I <0.03 <0.03 ng/mL    Comment: Performed at [facility], [address], [place], [place] [address]  Glucose, capillary     Status: Abnormal   Collection Time: 08/31/17  7:31 AM  Result Value Ref Range   Glucose-Capillary 201 (H) 70 - 99 mg/dL   Comment 1 Notify RN    Comment 2 Document in Chart     ABGS No results for input(s): PHART, PO2ART, TCO2, HCO3 in the last 72 hours.  Invalid input(s): PCO2 CULTURES Recent Results (from the past 240 hour(s))  MRSA PCR Screening     Status: None   Collection Time:  08/25/17 10:50 PM  Result Value Ref Range Status   MRSA by PCR NEGATIVE NEGATIVE Final    Comment:        The GeneXpert MRSA Assay (FDA approved for NASAL specimens only), is one component of a comprehensive MRSA colonization surveillance program. It is not intended to diagnose MRSA infection nor to guide or monitor treatment for MRSA infections. Performed at [facility], [address] [place], [place] [address]    Studies/Results: Dg Chest Port 1 View  Result Date: 08/30/2017 CLINICAL DATA:  Shortness of breath. EXAM: PORTABLE CHEST 1 VIEW COMPARISON:  Radiograph of August 28, 2017. FINDINGS: Stable cardiomediastinal silhouette and central pulmonary vascular congestion is noted. No pneumothorax is noted. Stable bibasilar pneumonia or atelectasis is noted with associated small effusions. Bony thorax is unremarkable. IMPRESSION: Stable central pulmonary vascular congestion. Stable bibasilar pneumonia or atelectasis is noted with small associated pleural effusions. Electronically Signed   By: Marijo Conception, M.D.   On: 08/30/2017 11:34    Medications:  Prior to Admission:  Medications Prior to Admission  Medication Sig Dispense Refill Last Dose  . albuterol (PROVENTIL) (5 MG/ML) 0.5% nebulizer solution Take 0.5 mLs (2.5 mg total) by nebulization every 6 (six) hours as needed for wheezing or shortness of breath. 20 mL 0 08/25/2017 at Unknown time  . allopurinol (ZYLOPRIM) 300 MG tablet Take 300 mg by mouth every evening.    08/25/2017 at Unknown time  . azithromycin (ZITHROMAX) 250 MG tablet Take 250-500 mg by mouth See admin instructions. 525m on day 1 then take 2519mon days 2 through 5 starting on 08/24/2017  0 08/25/2017 at Unknown time  . budesonide-formoterol (SYMBICORT) 160-4.5 MCG/ACT inhaler Inhale 1 puff into the lungs 2 (two) times daily.   08/25/2017 at Unknown time  . Calcium Carb-Cholecalciferol [id number] MG-UNIT CAPS Take 2 capsules by mouth every evening.   08/24/2017 at  Unknown time  . fexofenadine (ALLEGRA) 180 MG tablet Take 180 mg by mouth every evening.   08/24/2017 at Unknown time  . fluticasone (FLONASE) 50 MCG/ACT nasal spray Place 2 sprays into both nostrils every evening.   08/24/2017 at Unknown time  . ibuprofen (ADVIL,MOTRIN) 200 MG tablet Take 800 mg by mouth every 6 (six) hours as needed for mild pain or moderate pain.   Past Week at Unknown time  . ipratropium (ATROVENT) 0.02 %  nebulizer solution Take 2.5 mLs (0.5 mg total) by nebulization 4 (four) times daily. 75 mL 0 08/25/2017 at Unknown time  . KOMBIGLYZE XR 2.05-998 MG TB24 Take 1 tablet by mouth every evening.   11 08/24/2017 at Unknown time  . pantoprazole (PROTONIX) 40 MG tablet Take 40-80 mg by mouth every evening.   11 08/24/2017 at Unknown time  . [EXPIRED] predniSONE (DELTASONE) 20 MG tablet Take 3 tablets (60 mg total) by mouth daily for 5 days. 15 tablet 0 08/25/2017 at Unknown time  . rizatriptan (MAXALT) 5 MG tablet Take 5 mg by mouth as needed for migraine. May repeat in 2 hours if needed   unknown   Scheduled: . acetylcysteine  3 mL Nebulization Q6H  . azithromycin  500 mg Oral QHS  . enoxaparin (LOVENOX) injection  40 mg Subcutaneous Q24H  . insulin aspart  0-9 Units Subcutaneous TID WC  . ipratropium-albuterol  3 mL Nebulization Q6H  . methylPREDNISolone (SOLU-MEDROL) injection  40 mg Intravenous Q24H  . nicotine  14 mg Transdermal Daily  . pantoprazole  40 mg Oral Daily  . sodium chloride flush  3 mL Intravenous Q12H   Continuous: . sodium chloride    . cefTRIAXone (ROCEPHIN)  IV Stopped (08/30/17 2050)   JUV:QQUIVH chloride, albuterol, sodium chloride flush  Assesment: She was admitted with acute bronchospasm.  She also had acute hypoxic respiratory failure.  She looks like she has pneumonia bilaterally and she is being treated for that.  She had another episode of probable bronchospasm yesterday that did improve with an albuterol treatment. Principal Problem:    Bronchospasm, acute Active Problems:   Diabetes mellitus type 2 in obese (HCC)   Morbid obesity ([place])   Polycystic ovary disease   Tobacco abuse   SOB (shortness of breath)   Acute respiratory failure ([place])    Plan: Continue treatments.  Still potential for discharge.  In addition to the sleep study she will need to have outpatient pulmonary function testing    LOS: 6 days   Pratyush Ammon L 08/31/2017, 8:59 AM

## 2017-09-01 ENCOUNTER — Other Ambulatory Visit: Payer: Self-pay | Admitting: *Deleted

## 2017-09-01 NOTE — Patient Outreach (Signed)
[facility]N) Care Management  09/01/2017  Cheryl Black 10/04/1985 [id number]   Subjective: Telephone call to patient's home  / mobile number, no answer, left HIPAA compliant voicemail message, and requested call back.       Objective:Per KPN (Knowledge Performance Now, point of care tool) and chart review, patient hospitalized 08/25/17 -08/31/17 for acute bronchospasm.   Patient also has a history of diabetes, tobacco abuse, Polycystic ovary disease, and  Acute respiratory failure.         Assessment: Received UMR Transition of care referral on 08/26/17.   Transition of care follow up pending patient contact.       Plan: RNCM will send unsuccessful outreach  letter, [facility] pamph[facility], will call patient for 2nd telephone outreach attempt, transition of care follow up, and proceed with case closure, within 10 business days if no return call.        Mykael Trott H. Gardiner Barefootooper RN, BSN, CCM [facility] Care Management [facility] Telephonic CM Phone: [phone] Fax: [phone]

## 2017-09-02 ENCOUNTER — Other Ambulatory Visit: Payer: Self-pay | Admitting: *Deleted

## 2017-09-02 ENCOUNTER — Encounter: Payer: Self-pay | Admitting: *Deleted

## 2017-09-02 NOTE — Patient Outreach (Signed)
[facility]) Care Management  09/02/2017  Cheryl Black 02/14/1985 [id number]   Subjective: Received voicemail from Cheryl CurbKristy Black, states she is returning call, and requested call back.  Telephone call to patient's home / mobile number, spoke with patient, and HIPAA verified.  Discussed [facility] Care Management [facility] Transition of care follow up, patient voiced understanding, and is in agreement to follow up.  Patient states things are about the same, no worse since hospitalization, was advised by MD, recovery may take some time since she had pneumonia, had collapsed lung, still some chest tightness, and shortness of breath at times.  Patient is aware of signs / symptoms to report to MD and will call for sooner follow up appointment if needed.   States she has a follow up appointment with primary MD on 09/11/17 and pulmonologist on 09/22/17.  Patient states she is able to manage self care and has assistance as needed. Patient voices understanding of medical diagnosis and treatment plan. States she is accessing the following [facility] benefits: outpatient pharmacy, hospital indemnity (verbally given contact number for [employer] [phone], will file claim if appropriate, verbally given contact number for [facility] [phone] to request itemized bill), and will call [employer]  (verbally given contact number for [employer] [phone]) to start family medical leave act (FMLA) process as needed.  States she will follow up with Monia Pouchetna  (verbally given contact number, [phone]) regarding her short term disability questions.  Discussed diabetes disease management resources for [facility] employees and advised RNCM will send web sites in the successful outreach letter.   Patient states she will follow up on diabetes management programs if needed in the future. Patient states she does not have any education material, transition of care, care coordination, disease management, disease monitoring,  transportation, community resource, or pharmacy needs at this time.   States she is very appreciative of the follow up and is in agreement to receive [facility] Care [facility] information.      Objective:Per KPN (Knowledge Performance Now, point of care tool) and chart review, patient hospitalized 08/25/17 -08/31/17 for acute bronchospasm.   Patient also has a history of diabetes, tobacco abuse, Polycystic ovary disease, and  Acute respiratory failure.         Assessment: Received UMR Transition of care referral on 08/26/17.   Transition of care follow up completed, no care management needs, and will proceed with case closure.       Plan: RNCM will send patient successful outreach letter, [facility] pamphlet, and magnet. RNCM will complete case closure due to follow up completed / no care management needs.          Kataryna Mcquilkin H. Gardiner Barefootooper RN, BSN, CCM [facility] Care Management [facility] Telephonic CM Phone: [phone] Fax: [phone]

## 2017-09-03 ENCOUNTER — Other Ambulatory Visit: Payer: Self-pay | Admitting: *Deleted

## 2017-09-03 NOTE — Patient Outreach (Signed)
[facility]N) Care Management  09/03/2017  Cheryl LotKristy A Black 07/08/1985 [id number]   Received EMMI General Discharge red flag referral on 09/03/17.  Trigger date: 09/02/17.  Patient answered yes to the following: questions about discharge papers and unfilled prescriptions.  Transition of care follow up completed on 09/02/17, no additional outreach needed,  and no care management needs at this time.   Case will remain closed.    Aleeta Schmaltz H. Gardiner Barefootooper RN, BSN, CCM [facility] Care Management [facility] Telephonic CM Phone: [phone] Fax: [phone]

## 2017-09-10 DIAGNOSIS — R0602 Shortness of breath: Secondary | ICD-10-CM | POA: Diagnosis not present

## 2017-09-10 DIAGNOSIS — Z6841 Body Mass Index (BMI) 40.0 and over, adult: Secondary | ICD-10-CM | POA: Diagnosis not present

## 2017-09-10 DIAGNOSIS — J9801 Acute bronchospasm: Secondary | ICD-10-CM | POA: Diagnosis not present

## 2017-09-10 DIAGNOSIS — J9690 Respiratory failure, unspecified, unspecified whether with hypoxia or hypercapnia: Secondary | ICD-10-CM | POA: Diagnosis not present

## 2017-09-10 DIAGNOSIS — R0681 Apnea, not elsewhere classified: Secondary | ICD-10-CM | POA: Diagnosis not present

## 2017-09-10 DIAGNOSIS — R0683 Snoring: Secondary | ICD-10-CM | POA: Diagnosis not present

## 2017-09-11 ENCOUNTER — Other Ambulatory Visit (HOSPITAL_COMMUNITY): Payer: Self-pay | Admitting: Respiratory Therapy

## 2017-09-11 DIAGNOSIS — Z720 Tobacco use: Secondary | ICD-10-CM

## 2017-09-11 DIAGNOSIS — R0602 Shortness of breath: Secondary | ICD-10-CM

## 2017-09-16 ENCOUNTER — Encounter: Payer: Self-pay | Admitting: Internal Medicine

## 2017-09-16 ENCOUNTER — Other Ambulatory Visit (INDEPENDENT_AMBULATORY_CARE_PROVIDER_SITE_OTHER): Payer: 59

## 2017-09-16 ENCOUNTER — Ambulatory Visit: Payer: 59 | Admitting: Internal Medicine

## 2017-09-16 VITALS — BP 112/84 | HR 80 | Ht 67.0 in | Wt 297.0 lb

## 2017-09-16 DIAGNOSIS — R05 Cough: Secondary | ICD-10-CM

## 2017-09-16 DIAGNOSIS — R0609 Other forms of dyspnea: Secondary | ICD-10-CM

## 2017-09-16 DIAGNOSIS — R0600 Dyspnea, unspecified: Secondary | ICD-10-CM

## 2017-09-16 DIAGNOSIS — R059 Cough, unspecified: Secondary | ICD-10-CM

## 2017-09-16 LAB — CBC WITH DIFFERENTIAL/PLATELET
BASOS ABS: 0.1 10*3/uL (ref 0.0–0.1)
BASOS PCT: 1.6 % (ref 0.0–3.0)
EOS ABS: 0.2 10*3/uL (ref 0.0–0.7)
Eosinophils Relative: 2.4 % (ref 0.0–5.0)
HCT: 47.7 % — ABNORMAL HIGH (ref 36.0–46.0)
Hemoglobin: 16.3 g/dL — ABNORMAL HIGH (ref 12.0–15.0)
LYMPHS ABS: 2.3 10*3/uL (ref 0.7–4.0)
Lymphocytes Relative: 25.4 % (ref 12.0–46.0)
MCHC: 34.2 g/dL (ref 30.0–36.0)
MCV: 88.2 fl (ref 78.0–100.0)
Monocytes Absolute: 0.6 10*3/uL (ref 0.1–1.0)
Monocytes Relative: 6.2 % (ref 3.0–12.0)
NEUTROS ABS: 5.7 10*3/uL (ref 1.4–7.7)
NEUTROS PCT: 64.4 % (ref 43.0–77.0)
PLATELETS: 208 10*3/uL (ref 150.0–400.0)
RBC: 5.41 Mil/uL — ABNORMAL HIGH (ref 3.87–5.11)
RDW: 13.5 % (ref 11.5–15.5)
WBC: 8.9 10*3/uL (ref 4.0–10.5)

## 2017-09-16 LAB — SEDIMENTATION RATE: Sed Rate: 35 mm/hr — ABNORMAL HIGH (ref 0–20)

## 2017-09-16 MED ORDER — BUDESONIDE-FORMOTEROL FUMARATE 160-4.5 MCG/ACT IN AERO: 1.0000 | INHALATION_SPRAY | Freq: Two times a day (BID) | RESPIRATORY_TRACT | 0 refills | Status: DC

## 2017-09-16 MED ORDER — FAMOTIDINE 20 MG PO TABS: ORAL_TABLET | ORAL | 2 refills | Status: DC

## 2017-09-16 NOTE — Patient Instructions (Addendum)
Increase symbicort 160  Take 2 puffs first thing in am and then another 2 puffs about 12 hours later.   Work on inhaler technique:  relax and gently blow all the way out then take a nice smooth deep breath back in, triggering the inhaler at same time you start breathing in.  Hold for up to 5 seconds if you can. Blow out thru nose. Rinse and gargle with water when done     Only use your albuterol as a rescue medication to be used if you can't catch your breath by resting or doing a relaxed purse lip breathing pattern.  - The less you use it, the better it will work when you need it. - Ok to use up to 2 puffs  every 4 hours if you must but call for immediate appointment if use goes up over your usual need - Don't leave home without it !!  (think of it like the spare tire for your car)     Pantoprazole (protonix) 40 mg   Take  30-60 min before first meal of the day and Pepcid (famotidine)  20 mg one @  bedtime until return to office - this is the best way to tell whether stomach acid is contributing to your problem.     GERD (REFLUX)  is an extremely common cause of respiratory symptoms just like yours , many times with no obvious heartburn at all.    It can be treated with medication, but also with lifestyle changes including elevation of the head of your bed (ideally with 6 inch  bed blocks),  Smoking cessation, avoidance of late meals, excessive alcohol, and avoid fatty foods, chocolate, peppermint, colas, red wine, and acidic juices such as orange juice.  NO MINT OR MENTHOL PRODUCTS SO NO COUGH DROPS   USE SUGARLESS CANDY INSTEAD (Jolley ranchers or Stover's or Life Savers) or even ice chips will also do - the key is to swallow to prevent all throat clearing. NO OIL BASED VITAMINS - use powdered substitutes.   Please remember to go to the lab department downstairs in the basement  for your tests - we will call you with the results when they are available.           Please schedule a  follow up office visit in 4 weeks, sooner if needed

## 2017-09-16 NOTE — Progress Notes (Signed)
Cheryl Black, female    DOB: 13-Jan-1985, 32 y.o.   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  Played basketball with seasonal sinus issues spring /fall since childhood takes allegra/ flonase never bad enough to see specialist but March 2018 flu/sinusitis / pna never admitted slow recovery back to baseline s meds  Then cp / wheeze fall 2018 > 100% no need for anything until August 2019 lots of nasal symptoms s much cough  then sob     Admit date: 08/25/2017 Discharge date: 08/31/2017      Discharge Diagnoses:  Principal Problem:   Bronchospasm, acute Active Problems:   Diabetes mellitus type 2 in obese ([place])   Morbid obesity ([place])   Polycystic ovary disease   Tobacco abuse   SOB (shortness of breath)   Acute respiratory failure ([place])   Discharge Condition: Stable and improved       Filed Weights   08/25/17 2303 08/26/17 1434 08/27/17 0406  Weight: (!) 137.8 kg 132.4 kg (!) 138.9 kg    History of present illness:  As per Dr. Shanon Brow on 8/20[name] a 32 y.o.femalewith medical history significant ofdiabetes, PCOS, obesity, pack a day smoker with no prior history of asthma or COPD comes in with several days of shortness of breath and wheezing at home. Patient was seen here in the ED on August 18 and given oral steroids and albuterol to take at home. She did this around the clock was not getting any better she saw her primary care physician yesterday who added on azithromycin orally and Spiriva inhaler. Patient reports she continued to still not get better. She denies any fevers but says that she is having a spasmy cough. She denies any lower extremity swelling or edema. She has no history of VT and has not really had any recent traveling. She is not on any estrogen medications. She does not have chest pain but reports some right sided posterior pain when taking a deep breath. Patient is being referred for admission for  bronchospasm. She is been on BiPAP for the last couple of hours and she feels like this is helping her. Per respiratory therapy she is wheezing less. Patient be referred for admission for acute respiratory failure requiring BiPAP support.  Hospital Course:   Acute hypoxemic respiratory failure -She has not required BIPAP past72 hours. -CT shows no evidence of PE, however she does have significant bilateral lower lobe atelectasis and right middle lobe atelectasis, radiologist is also concerned about a component of superimposed pneumonia. There is no evidence of pulmonary edema. -No significant wheezing on exam. -Transition abx over to augmentin for 3 more days on DC. -Continue incentive spirometer and flutter valve. -DC on a prednisone taper. Albuterol and symbicort. -Given long-standing tobacco abuse, would benefit from PFTs post discharge. -Would also benefit from sleep study as an outpatient given her morbid obesity and probability of sleep apnea/OHS. -Getting alpha 1 antitrypsin level per Dr. Luan Pulling recommendations. Pending on DC. -Currently without oxygen requirements.  Tobacco abuse -Tobacco cessation was discussed with the patient on 8/21. -She would like to be discharged with nicotine patches.  Morbid obesity -Noted, counseled on lifestyle modifications.  Type 2 diabetes -CBGs have been elevated, likely due to steroid use, but improving with steroid titration. -A1c is elevated at 7.1. -Close OP follow up.    Discharge Instructions      Discharge Instructions    Diet - low sodium  heart healthy   Complete by:  As directed    Increase activity slowly   Complete by:  As directed           Allergies as of 08/31/2017      Reactions   Ace Inhibitors Anaphylaxis   Beta Adrenergic Blockers Other (See Comments)   Angioedema   Augmentin [amoxicillin-pot Clavulanate] Other (See Comments)   Has had C.diff in the past   Benicar [olmesartan] Other (See  Comments)   Facial edema   Doxycycline Other (See Comments)   Makes feet and hands red   Influenza Vaccine Live Swelling   Arm    Invokana [canagliflozin] Other (See Comments)   Dehydration         Medication List    STOP taking these medications   azithromycin 250 MG tablet Commonly known as:  ZITHROMAX   ipratropium 0.02 % nebulizer solution Commonly known as:  ATROVENT     TAKE these medications   albuterol (5 MG/ML) 0.5% nebulizer solution Commonly known as:  PROVENTIL Take 0.5 mLs (2.5 mg total) by nebulization every 6 (six) hours as needed for wheezing or shortness of breath. What changed:  Another medication with the same name was added. Make sure you understand how and when to take each.   albuterol 108 (90 Base) MCG/ACT inhaler Commonly known as:  PROVENTIL HFA;VENTOLIN HFA Inhale 2 puffs into the lungs every 6 (six) hours as needed for wheezing or shortness of breath. What changed:  You were already taking a medication with the same name, and this prescription was added. Make sure you understand how and when to take each.   allopurinol 300 MG tablet Commonly known as:  ZYLOPRIM Take 300 mg by mouth every evening.   amoxicillin-clavulanate 875-125 MG tablet Commonly known as:  AUGMENTIN Take 1 tablet by mouth 2 (two) times daily for 3 days.   budesonide-formoterol 160-4.5 MCG/ACT inhaler Commonly known as:  SYMBICORT Inhale 1 puff into the lungs 2 (two) times daily. What changed:  when to take this   Calcium Carb-Cholecalciferol [phone] MG-UNIT Caps Take 2 capsules by mouth every evening.   fexofenadine 180 MG tablet Commonly known as:  ALLEGRA Take 180 mg by mouth every evening.   fluticasone 50 MCG/ACT nasal spray Commonly known as:  FLONASE Place 2 sprays into both nostrils every evening.   ibuprofen 200 MG tablet Commonly known as:  ADVIL,MOTRIN Take 800 mg by mouth every 6 (six) hours as needed for mild pain or moderate  pain.   ipratropium-albuterol 0.5-2.5 (3) MG/3ML Soln Commonly known as:  DUONEB Take 3 mLs by nebulization every 6 (six) hours.   KOMBIGLYZE XR 2.05-998 MG Tb24 Generic drug:  Saxagliptin-Metformin Take 1 tablet by mouth every evening.   nicotine 14 mg/24hr patch Commonly known as:  NICODERM CQ - dosed in mg/24 hours Place 1 patch (14 mg total) onto the skin daily. Start taking on:  09/01/2017   pantoprazole 40 MG tablet Commonly known as:  PROTONIX Take 1 tablet (40 mg total) by mouth daily. Start taking on:  09/01/2017 What changed:    how much to take  when to take this   predniSONE 10 MG tablet Commonly known as:  DELTASONE Take 1 tablet (10 mg total) by mouth daily with breakfast. Take 6 tablets today and then decrease by 1 tablet daily until none are left. What changed:    medication strength  how much to take  when to take this  additional instructions  rizatriptan 5 MG tablet Commonly known as:  MAXALT Take 5 mg by mouth as needed for migraine. May repeat in 2 hours if needed         09/16/2017  Pulmonary / first [facility] /Loria Lacina  Chief Complaint  Patient presents with  . Pulmonary Consult    Referred by Dr. Allyn Kenner. Pt states she has had recurrent PNA since March 2018.  She c/o DOE with minimal exertion such as taking a shower. She has occ chest tightness. She uses her albuterol inhaler 3-4 x per wk on average, but has not needed neb recently.   50% improved but variable sob showering /  Dyspnea:   MMRC1 = can walk nl pace, flat grade, can't hurry or go uphills or steps s sob   Cough: none Sleep: on side / big pillow SABA use: as above, much less need now    No obvious day to day or daytime variability or assoc excess/ purulent sputum or mucus plugs or hemoptysis or cp or chest tightness, subjective wheeze or overt sinus or hb symptoms.   Sleeping as above  without nocturnal  or early am exacerbation  of respiratory  c/o's or need for noct saba.  Also denies any obvious fluctuation of symptoms with weather or environmental changes or other aggravating or alleviating factors except as outlined above   No unusual exposure hx or h/o childhood pna/ asthma or knowledge of premature birth.  Current Allergies, Complete Past Medical History, Past Surgical History, Family History, and Social History were reviewed in [facility] record.  ROS  The following are not active complaints unless bolded Hoarseness, sore throat, dysphagia, dental problems, itching, sneezing,  nasal congestion or discharge of excess mucus or purulent secretions, ear ache,   fever, chills, sweats, unintended wt loss or wt gain, classically pleuritic or exertional cp,  orthopnea pnd or arm/hand swelling  or leg swelling, presyncope, palpitations, abdominal pain, anorexia, nausea, vomiting, diarrhea  or change in bowel habits or change in bladder habits, change in stools or change in urine, dysuria, hematuria,  rash, arthralgias, visual complaints, headache, numbness, weakness or ataxia or problems with walking or coordination,  change in mood or  memory.              Past Medical History:  Diagnosis Date  . Acid reflux   . Asthma   . Clostridium difficile infection    in the past  . Diabetes mellitus   . Fatty liver   . Fibrocystic breast disease   . Gout   . Hidradenitis   . History of kidney stones   . Hyperlipemia   . Obesity   . Polycystic ovary syndrome   . PONV (postoperative nausea and vomiting)   . Smoker     Outpatient Medications Prior to Visit  Medication Sig Dispense Refill  . albuterol (PROVENTIL HFA;VENTOLIN HFA) 108 (90 Base) MCG/ACT inhaler Inhale 2 puffs into the lungs every 6 (six) hours as needed for wheezing or shortness of breath. 1 Inhaler 2  . albuterol (PROVENTIL) (5 MG/ML) 0.5% nebulizer solution Take 0.5 mLs (2.5 mg total) by nebulization every 6 (six) hours as needed for wheezing or shortness of breath. 20 mL 0    . allopurinol (ZYLOPRIM) 300 MG tablet Take 300 mg by mouth every evening.     . budesonide-formoterol (SYMBICORT) 160-4.5 MCG/ACT inhaler Inhale 1 puff into the lungs 2 (two) times daily. 1 Inhaler 2  . Calcium Carb-Cholecalciferol [phone] MG-UNIT CAPS Take 2  capsules by mouth every evening.    . fexofenadine (ALLEGRA) 180 MG tablet Take 180 mg by mouth every evening.    . fluticasone (FLONASE) 50 MCG/ACT nasal spray Place 2 sprays into both nostrils every evening.    Marland Kitchen ibuprofen (ADVIL,MOTRIN) 200 MG tablet Take 800 mg by mouth every 6 (six) hours as needed for mild pain or moderate pain.    Marland Kitchen ipratropium-albuterol (DUONEB) 0.5-2.5 (3) MG/3ML SOLN Take 3 mLs by nebulization every 6 (six) hours. 360 mL 2  . KOMBIGLYZE XR 2.05-998 MG TB24 Take 1 tablet by mouth every evening.   11  . pantoprazole (PROTONIX) 40 MG tablet Take 1 tablet (40 mg total) by mouth daily. 30 tablet 2  . rizatriptan (MAXALT) 5 MG tablet Take 5 mg by mouth as needed for migraine. May repeat in 2 hours if needed    . nicotine (NICODERM CQ - DOSED IN MG/24 HOURS) 14 mg/24hr patch Place 1 patch (14 mg total) onto the skin daily. 28 patch 0  . predniSONE (DELTASONE) 10 MG tablet Take 1 tablet (10 mg total) by mouth daily with breakfast. Take 6 tablets today and then decrease by 1 tablet daily until none are left. 21 tablet 0   No facility-administered medications prior to visit.             Objective:     BP 112/84 (BP Location: Left Arm, Cuff Size: Large)   Pulse 80   Ht 5' 7"  (1.702 m)   Wt 297 lb (134.7 kg)   LMP 08/19/2017   SpO2 96%   BMI 46.52 kg/m   SpO2: 96 % RA    Wt Readings from Last 3 Encounters:  09/16/17 297 lb (134.7 kg)  08/27/17 (!) 306 lb 3.5 oz (138.9 kg)  08/23/17 287 lb 14.7 oz (130.6 kg)          HEENT: nl dentition, turbinates bilaterally, and oropharynx. Nl external ear canals without cough reflex   NECK :  without JVD/Nodes/TM/ nl carotid upstrokes bilaterally   LUNGS: no  acc muscle use,  Nl contour chest with scattered insp/exp rhonchi  bilaterally without cough on insp or exp maneuvers   CV:  RRR  no s3 or murmur or increase in P2, and no edema   ABD:  Quite obese/ soft and nontender with nl inspiratory excursion in the supine position. No bruits or organomegaly appreciated, bowel sounds nl  MS:  Nl gait/ ext warm without deformities, calf tenderness, cyanosis or clubbing No obvious joint restrictions   SKIN: warm and dry without lesions    NEURO:  alert, approp, nl sensorium with  no motor or cerebellar deficits apparent.    Labs ordered 09/16/2017   Allergy profile Lab Results  Component Value Date   ESRSEDRATE 35 (H) 09/16/2017        Assessment   DOE (dyspnea on exertion) Allergy profile 09/16/2017 >  Eos 0.2 /  IgE pending  - - The proper method of use, as well as anticipated side effects, of a metered-dose inhaler are discussed and demonstrated to the patient. Improved effectiveness after extensive coaching during this visit to a level of approximately 75 % from a baseline of 50 %     Symptoms are   disproportionate to objective findings and not clear to what extent this is actually a pulmonary  problem but pt does appear to have difficult to sort out respiratory symptoms of unknown origin for which  DDX  = almost all start with  A and  include Adherence, Ace Inhibitors, Acid Reflux, Active Sinus Disease, Alpha 1 Antitripsin deficiency, Anxiety masquerading as Airways dz,  ABPA,  Allergy(esp in young), Aspiration (esp in elderly), Adverse effects of meds,  Active smokers, A bunch of PE's/clot burden (a few small clots can't cause this syndrome unless there is already severe underlying pulm or vascular dz with poor reserve),  Anemia or thyroid disorder, plus two Bs  = Bronchiectasis and Beta blocker use..and one C= CHF   Adherence is always the initial "prime suspect" and is a multilayered concern that requires a "trust but verify" approach in every  patient - starting with knowing how to use medications, especially inhalers, correctly, keeping up with refills and understanding the fundamental difference between maintenance and prns vs those medications only taken for a very short course and then stopped and not refilled.  - see hfa teaching - return with all meds in hand using a trust but verify approach to confirm accurate Medication  Reconciliation The principal here is that until we are certain that the  patients are doing what we've asked, it makes no sense to ask them to do more.    ? Allergy/asthma > send profile, increase symb to 160 2bid   ? Active sinus dz >  sinus ct rather than further abx at this point  ? Alpha one def >  MM documented last admit  ? Active smoking > denies cigs/vapes at this point > reinforced   ?  A bunch of PE's > excluded by CTa 08/26/17   ? Acid (or non-acid) GERD > always difficult to exclude as up to 75% of pts in some series report no assoc GI/ Heartburn symptoms> rec max (24h)  acid suppression and diet restrictions/ reviewed and instructions given in writing.   ? Adverse drug effects > none of the usual suspects listed  ? Bronchiectasis > not present on CTa 08/07/17  ? CHF > see echo 08/28/17 wnl    Will return in 4 weeks  to complete the w/u with pft and sinus ct before then          Morbid obesity ([place]) Body mass index is 46.52 kg/m.  -  trending back down Lab Results  Component Value Date   TSH 2.368 01/08/2011     Contributing to gerd risk/ doe/reviewed the need and the process to achieve and maintain neg calorie balance > defer f/u primary care including intermittently monitoring thyroid status       Total time devoted to counseling  > 50 % of initial 60 min office visit:  review case with pt/ discussion of options/alternatives/ personally creating written customized instructions  in presence of pt  then going over those specific  Instructions directly with the pt including how  to use all of the meds but in particular covering each new medication in detail and the difference between the maintenance= "automatic" meds and the prns using an action plan format for the latter (If this problem/symptom => do that organization reading Left to right).  Please see AVS from this visit for a full list of these instructions which I personally wrote for this pt and  are unique to this visit.   See device teaching which extended face to face time for this visit      Christinia Gully, MD 09/16/2017

## 2017-09-17 ENCOUNTER — Encounter: Payer: Self-pay | Admitting: Internal Medicine

## 2017-09-17 LAB — RESPIRATORY ALLERGY PROFILE REGION II ~~LOC~~
Allergen, Cottonwood, t14: <0.1 kU/L
Allergen, Mouse Urine Protein, e78: <0.1 kU/L
Allergen, Oak,t7: <0.1 kU/L
Aspergillus fumigatus, m3: <0.1 kU/L
Bermuda Grass: <0.1 kU/L
CAT DANDER: 0.32 kU/L — AB
CLADOSPORIUM HERBARUM (M2) IGE: <0.1 kU/L
CLASS: 0
CLASS: 0
CLASS: 0
CLASS: 0
CLASS: 0
CLASS: 0
CLASS: 0
CLASS: 0
CLASS: 0
CLASS: 0
CLASS: 2
Class: 0
Class: 0
Class: 0
Class: 0
Class: 0
Class: 0
Class: 0
Class: 0
Class: 0
Class: 0
Class: 0
Class: 0
Class: 2
Cockroach: 0.32 kU/L — ABNORMAL HIGH
Dog Dander: 2.16 kU/L — ABNORMAL HIGH
IGE (IMMUNOGLOBULIN E), SERUM: 53 kU/L (ref <=114)
Sheep Sorrel IgE: <0.1 kU/L
Timothy Grass: 1.31 kU/L — ABNORMAL HIGH

## 2017-09-17 LAB — INTERPRETATION:

## 2017-09-17 NOTE — Assessment & Plan Note (Addendum)
Body mass index is 46.52 kg/m.  -  trending back down Lab Results  Component Value Date   TSH 2.368 01/08/2011     Contributing to gerd risk/ doe/reviewed the need and the process to achieve and maintain neg calorie balance > defer f/u primary care including intermittently monitoring thyroid status       Total time devoted to counseling  > 50 % of initial 60 min office visit:  review case with pt/ discussion of options/alternatives/ personally creating written customized instructions  in presence of pt  then going over those specific  Instructions directly with the pt including how to use all of the meds but in particular covering each new medication in detail and the difference between the maintenance= "automatic" meds and the prns using an action plan format for the latter (If this problem/symptom => do that organization reading Left to right).  Please see AVS from this visit for a full list of these instructions which I personally wrote for this pt and  are unique to this visit.   See device teaching which extended face to face time for this visit

## 2017-09-17 NOTE — Assessment & Plan Note (Addendum)
Allergy profile 09/16/2017 >  Eos 0.2 /  IgE pending  - - The proper method of use, as well as anticipated side effects, of a metered-dose inhaler are discussed and demonstrated to the patient. Improved effectiveness after extensive coaching during this visit to a level of approximately 75 % from a baseline of 50 %     Symptoms are   disproportionate to objective findings and not clear to what extent this is actually a pulmonary  problem but pt does appear to have difficult to sort out respiratory symptoms of unknown origin for which  DDX  = almost all start with A and  include Adherence, Ace Inhibitors, Acid Reflux, Active Sinus Disease, Alpha 1 Antitripsin deficiency, Anxiety masquerading as Airways dz,  ABPA,  Allergy(esp in young), Aspiration (esp in elderly), Adverse effects of meds,  Active smokers, A bunch of PE's/clot burden (a few small clots can't cause this syndrome unless there is already severe underlying pulm or vascular dz with poor reserve),  Anemia or thyroid disorder, plus two Bs  = Bronchiectasis and Beta blocker use..and one C= CHF   Adherence is always the initial "prime suspect" and is a multilayered concern that requires a "trust but verify" approach in every patient - starting with knowing how to use medications, especially inhalers, correctly, keeping up with refills and understanding the fundamental difference between maintenance and prns vs those medications only taken for a very short course and then stopped and not refilled.  - see hfa teaching - return with all meds in hand using a trust but verify approach to confirm accurate Medication  Reconciliation The principal here is that until we are certain that the  patients are doing what we've asked, it makes no sense to ask them to do more.    ? Allergy/asthma > send profile, increase symb to 160 2bid   ? Active sinus dz >  sinus ct rather than further abx at this point  ? Alpha one def >  MM documented last admit  ? Active  smoking > denies cigs/vapes at this point > reinforced   ?  A bunch of PE's > excluded by CTa 08/26/17   ? Acid (or non-acid) GERD > always difficult to exclude as up to 75% of pts in some series report no assoc GI/ Heartburn symptoms> rec max (24h)  acid suppression and diet restrictions/ reviewed and instructions given in writing.   ? Adverse drug effects > none of the usual suspects listed  ? Bronchiectasis > not present on CTa 08/07/17  ? CHF > see echo 08/28/17 wnl    Will return in 4 weeks  to complete the w/u with pft and sinus ct before then

## 2017-09-21 ENCOUNTER — Telehealth: Payer: Self-pay | Admitting: Internal Medicine

## 2017-09-21 NOTE — Telephone Encounter (Signed)
Leave symbicort off completely and return by end of week with all meds in hand to regroup but if symptoms as described in note worsen off symbiort (instead of improving like note states) may need to see pcp or go to UC/ER in meantime as that is a very unusual reaction to symbicort that I have never seen

## 2017-09-21 NOTE — Telephone Encounter (Signed)
Called and spoke to pt.  Pt feels that she is having an intolerance to Symbicort.pt reports of nausea, vomiting, and excessive thirst. Pt states she increased Symbicort to two puffs last wednesday and symptoms developed on Thursday. Per pt last dose of Symbicort was Saturday. Sx have improved since stopping Symbicort.   MW please advise. Thanks  09/16/17 AVS:  Instructions   Increase symbicort 160  Take 2 puffs first thing in am and then another 2 puffs about 12 hours later.   Work on inhaler technique:  relax and gently blow all the way out then take a nice smooth deep breath back in, triggering the inhaler at same time you start breathing in.  Hold for up to 5 seconds if you can. Blow out thru nose. Rinse and gargle with water when done     Only use your albuterol as a rescue medication to be used if you can't catch your breath by resting or doing a relaxed purse lip breathing pattern.  - The less you use it, the better it will work when you need it. - Ok to use up to 2 puffs  every 4 hours if you must but call for immediate appointment if use goes up over your usual need - Don't leave home without it !!  (think of it like the spare tire for your car)     Pantoprazole (protonix) 40 mg   Take  30-60 min before first meal of the day and Pepcid (famotidine)  20 mg one @  bedtime until return to office - this is the best way to tell whether stomach acid is contributing to your problem.     GERD (REFLUX)  is an extremely common cause of respiratory symptoms just like yours , many times with no obvious heartburn at all.    It can be treated with medication, but also with lifestyle changes including elevation of the head of your bed (ideally with 6 inch  bed blocks),  Smoking cessation, avoidance of late meals, excessive alcohol, and avoid fatty foods, chocolate, peppermint, colas, red wine, and acidic juices such as orange juice.  NO MINT OR MENTHOL PRODUCTS SO NO COUGH DROPS   USE  SUGARLESS CANDY INSTEAD (Jolley ranchers or Stover's or Life Savers) or even ice chips will also do - the key is to swallow to prevent all throat clearing. NO OIL BASED VITAMINS - use powdered substitutes.   Please remember to go to the lab department downstairs in the basement  for your tests - we will call you with the results when they are available.           Please schedule a follow up office visit in 4 weeks, sooner if needed

## 2017-09-21 NOTE — Telephone Encounter (Signed)
Called patient she verbalized understanding nothing further needed at this time. Apt made 09/24/17

## 2017-09-23 ENCOUNTER — Ambulatory Visit (HOSPITAL_COMMUNITY)
Admission: RE | Admit: 2017-09-23 | Discharge: 2017-09-23 | Disposition: A | Discharge status: Home / Self Care | Payer: 59 | Source: Ambulatory Visit | Attending: Internal Medicine | Admitting: Internal Medicine

## 2017-09-23 DIAGNOSIS — F17200 Nicotine dependence, unspecified, uncomplicated: Secondary | ICD-10-CM | POA: Diagnosis not present

## 2017-09-23 DIAGNOSIS — I10 Essential (primary) hypertension: Secondary | ICD-10-CM | POA: Diagnosis not present

## 2017-09-23 DIAGNOSIS — Z720 Tobacco use: Secondary | ICD-10-CM | POA: Insufficient documentation

## 2017-09-23 DIAGNOSIS — R112 Nausea with vomiting, unspecified: Secondary | ICD-10-CM | POA: Diagnosis not present

## 2017-09-23 DIAGNOSIS — E119 Type 2 diabetes mellitus without complications: Secondary | ICD-10-CM | POA: Diagnosis not present

## 2017-09-23 DIAGNOSIS — Z6841 Body Mass Index (BMI) 40.0 and over, adult: Secondary | ICD-10-CM | POA: Diagnosis not present

## 2017-09-23 DIAGNOSIS — K219 Gastro-esophageal reflux disease without esophagitis: Secondary | ICD-10-CM | POA: Diagnosis not present

## 2017-09-23 DIAGNOSIS — J45909 Unspecified asthma, uncomplicated: Secondary | ICD-10-CM | POA: Diagnosis not present

## 2017-09-23 DIAGNOSIS — N390 Urinary tract infection, site not specified: Secondary | ICD-10-CM | POA: Diagnosis not present

## 2017-09-23 DIAGNOSIS — R0602 Shortness of breath: Secondary | ICD-10-CM | POA: Diagnosis not present

## 2017-09-23 DIAGNOSIS — R945 Abnormal results of liver function studies: Secondary | ICD-10-CM | POA: Diagnosis not present

## 2017-09-23 DIAGNOSIS — E782 Mixed hyperlipidemia: Secondary | ICD-10-CM | POA: Diagnosis not present

## 2017-09-23 DIAGNOSIS — E1165 Type 2 diabetes mellitus with hyperglycemia: Secondary | ICD-10-CM | POA: Diagnosis not present

## 2017-09-23 LAB — PULMONARY FUNCTION TEST
DL/VA % pred: 99 %
DL/VA: 5.23 ml/min/mmHg/L
DLCO UNC % PRED: 98 %
DLCO UNC: 29.19 ml/min/mmHg
FEF 25-75 POST: 5.17 L/s
FEF 25-75 PRE: 3.98 L/s
FEF2575-%CHANGE-POST: 29 %
FEF2575-%Pred-Post: 144 %
FEF2575-%Pred-Pre: 110 %
FEV1-%CHANGE-POST: 6 %
FEV1-%Pred-Post: 107 %
FEV1-%Pred-Pre: 100 %
FEV1-POST: 3.78 L
FEV1-Pre: 3.54 L
FEV1FVC-%Change-Post: 1 %
FEV1FVC-%PRED-PRE: 100 %
FEV6-%Change-Post: 5 %
FEV6-%Pred-Post: 105 %
FEV6-%Pred-Pre: 99 %
FEV6-Post: 4.43 L
FEV6-Pre: 4.19 L
FEV6FVC-%Pred-Post: 101 %
FEV6FVC-%Pred-Pre: 101 %
FVC-%CHANGE-POST: 5 %
FVC-%PRED-PRE: 98 %
FVC-%Pred-Post: 104 %
FVC-PRE: 4.19 L
FVC-Post: 4.43 L
POST FEV1/FVC RATIO: 85 %
PRE FEV1/FVC RATIO: 84 %
PRE FEV6/FVC RATIO: 100 %
Post FEV6/FVC ratio: 100 %
RV % PRED: 141 %
RV: 2.35 L
TLC % pred: 116 %
TLC: 6.6 L

## 2017-09-23 MED ORDER — ALBUTEROL SULFATE (2.5 MG/3ML) 0.083% IN NEBU
2.5000 mg | INHALATION_SOLUTION | Freq: Once | RESPIRATORY_TRACT | Status: AC
  Administered 2017-09-23: 2.5 mg via RESPIRATORY_TRACT

## 2017-09-23 MED FILL — PROMETHAZINE 25 MG TABLET: 25 | 5 days supply | Qty: 20 | Fill #0

## 2017-09-24 ENCOUNTER — Encounter: Payer: Self-pay | Admitting: Internal Medicine

## 2017-09-24 ENCOUNTER — Encounter (HOSPITAL_COMMUNITY): Payer: 59

## 2017-09-24 ENCOUNTER — Ambulatory Visit: Payer: 59 | Admitting: Internal Medicine

## 2017-09-24 VITALS — BP 132/80 | HR 101 | Ht 67.0 in | Wt 292.8 lb

## 2017-09-24 DIAGNOSIS — R0600 Dyspnea, unspecified: Secondary | ICD-10-CM

## 2017-09-24 DIAGNOSIS — R112 Nausea with vomiting, unspecified: Secondary | ICD-10-CM

## 2017-09-24 DIAGNOSIS — R0609 Other forms of dyspnea: Secondary | ICD-10-CM | POA: Diagnosis not present

## 2017-09-24 MED FILL — KOMBIGLYZE XR 2.5-1,000 MG: 2.5-1000 | 30 days supply | Qty: 30 | Fill #2

## 2017-09-24 MED FILL — ALLOPURINOL 300 MG TABS: 300 | 30 days supply | Qty: 30 | Fill #6

## 2017-09-24 NOTE — Progress Notes (Signed)
Cheryl Black, female    DOB: [birth year], 32 y.o.   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  Played basketball with seasonal sinus issues spring /fall since childhood takes allegra/ flonase never bad enough to see specialist but March 2018 flu/sinusitis / pna never admitted slow recovery back to baseline s meds  Then cp / wheeze fall 2018 > 100% no need for anything until August 2019 lots of nasal symptoms s much cough  then sob    Admit date: 08/25/2017 Discharge date: 08/31/2017      Discharge Diagnoses:  Principal Problem:   Bronchospasm, acute Active Problems:   Diabetes mellitus type 2 in obese ([place])   Morbid obesity ([place] )   Polycystic ovary disease   Tobacco abuse   SOB (shortness of breath)   Acute respiratory failure ([place])   Discharge Condition: Stable and improved       Filed Weights   08/25/17 2303 08/26/17 1434 08/27/17 0406  Weight: (!) 137.8 kg 132.4 kg (!) 138.9 kg    History of present illness:  As per Dr. Shanon Brow on 8/20Marita Black A Cheryl Black a 32 y.o.femalewith medical history significant ofdiabetes, PCOS, obesity, pack a day smoker with no prior history of asthma or COPD comes in with several days of shortness of breath and wheezing at home. Patient was seen here in the ED on August 18 and given oral steroids and albuterol to take at home. She did this around the clock was not getting any better she saw her primary care physician yesterday who added on azithromycin orally and Spiriva inhaler. Patient reports she continued to still not get better. She denies any fevers but says that she is having a spasmy cough. She denies any lower extremity swelling or edema. She has no history of VT and has not really had any recent traveling. She is not on any estrogen medications. She does not have chest pain but reports some right sided posterior pain when taking a deep breath. Patient is being referred for admission for  bronchospasm. She is been on BiPAP for the last couple of hours and she feels like this is helping her. Per respiratory therapy she is wheezing less. Patient be referred for admission for acute respiratory failure requiring BiPAP support.  Hospital Course:   Acute hypoxemic respiratory failure -She has not required BIPAP past72 hours. -CT shows no evidence of PE, however she does have significant bilateral lower lobe atelectasis and right middle lobe atelectasis, radiologist is also concerned about a component of superimposed pneumonia. There is no evidence of pulmonary edema. -No significant wheezing on exam. -Transition abx over to augmentin for 3 more days on DC. -Continue incentive spirometer and flutter valve. -DC on a prednisone taper. Albuterol and symbicort. -Given long-standing tobacco abuse, would benefit from PFTs post discharge. -Would also benefit from sleep study as an outpatient given her morbid obesity and probability of sleep apnea/OHS. -Getting alpha 1 antitrypsin level per Dr. Luan Pulling recommendations. Pending on DC. -Currently without oxygen requirements.  Tobacco abuse -Tobacco cessation was discussed with the patient on 8/21. -She would like to be discharged with nicotine patches.  Morbid obesity -Noted, counseled on lifestyle modifications.  Type 2 diabetes -CBGs have been elevated, likely due to steroid use, but improving with steroid titration. -A1c is elevated at 7.1. -Close OP follow up.    Discharge Instructions      Discharge Instructions    Diet - low sodium heart  healthy   Complete by:  As directed    Increase activity slowly   Complete by:  As directed           Allergies as of 08/31/2017      Reactions   Ace Inhibitors Anaphylaxis   Beta Adrenergic Blockers Other (See Comments)   Angioedema   Augmentin [amoxicillin-pot Clavulanate] Other (See Comments)   Has had C.diff in the past   Benicar [olmesartan] Other (See  Comments)   Facial edema   Doxycycline Other (See Comments)   Makes feet and hands red   Influenza Vaccine Live Swelling   Arm    Invokana [canagliflozin] Other (See Comments)   Dehydration         Medication List    STOP taking these medications   azithromycin 250 MG tablet Commonly known as:  ZITHROMAX   ipratropium 0.02 % nebulizer solution Commonly known as:  ATROVENT     TAKE these medications   albuterol (5 MG/ML) 0.5% nebulizer solution Commonly known as:  PROVENTIL Take 0.5 mLs (2.5 mg total) by nebulization every 6 (six) hours as needed for wheezing or shortness of breath. What changed:  Another medication with the same name was added. Make sure you understand how and when to take each.   albuterol 108 (90 Base) MCG/ACT inhaler Commonly known as:  PROVENTIL HFA;VENTOLIN HFA Inhale 2 puffs into the lungs every 6 (six) hours as needed for wheezing or shortness of breath. What changed:  You were already taking a medication with the same name, and this prescription was added. Make sure you understand how and when to take each.   allopurinol 300 MG tablet Commonly known as:  ZYLOPRIM Take 300 mg by mouth every evening.   amoxicillin-clavulanate 875-125 MG tablet Commonly known as:  AUGMENTIN Take 1 tablet by mouth 2 (two) times daily for 3 days.   budesonide-formoterol 160-4.5 MCG/ACT inhaler Commonly known as:  SYMBICORT Inhale 1 puff into the lungs 2 (two) times daily. What changed:  when to take this   Calcium Carb-Cholecalciferol [id number] MG-UNIT Caps Take 2 capsules by mouth every evening.   fexofenadine 180 MG tablet Commonly known as:  ALLEGRA Take 180 mg by mouth every evening.   fluticasone 50 MCG/ACT nasal spray Commonly known as:  FLONASE Place 2 sprays into both nostrils every evening.   ibuprofen 200 MG tablet Commonly known as:  ADVIL,MOTRIN Take 800 mg by mouth every 6 (six) hours as needed for mild pain or moderate  pain.   ipratropium-albuterol 0.5-2.5 (3) MG/3ML Soln Commonly known as:  DUONEB Take 3 mLs by nebulization every 6 (six) hours.   KOMBIGLYZE XR 2.05-998 MG Tb24 Generic drug:  Saxagliptin-Metformin Take 1 tablet by mouth every evening.   nicotine 14 mg/24hr patch Commonly known as:  NICODERM CQ - dosed in mg/24 hours Place 1 patch (14 mg total) onto the skin daily. Start taking on:  09/01/2017   pantoprazole 40 MG tablet Commonly known as:  PROTONIX Take 1 tablet (40 mg total) by mouth daily. Start taking on:  09/01/2017 What changed:    how much to take  when to take this   predniSONE 10 MG tablet Commonly known as:  DELTASONE Take 1 tablet (10 mg total) by mouth daily with breakfast. Take 6 tablets today and then decrease by 1 tablet daily until none are left. What changed:    medication strength  how much to take  when to take this  additional instructions   rizatriptan  5 MG tablet Commonly known as:  MAXALT Take 5 mg by mouth as needed for migraine. May repeat in 2 hours if needed         09/16/2017  Pulmonary / first Ok [facility] /[facility]  Chief Complaint  Patient presents with  . Pulmonary Consult    Referred by Dr. Delphina Cahill. Pt states she has had recurrent PNA since March 2018.  She c/o DOE with minimal exertion such as taking a shower. She has occ chest tightness. She uses her albuterol inhaler 3-4 x per wk on average, but has not needed neb recently.   50% improved but variable sob showering /  Dyspnea:   MMRC1 = can walk nl pace, flat grade, can't hurry or go uphills or steps s sob   Cough: none Sleep: on side / big pillow SABA use: as above, much less need now  rec Increase symbicort 160  Take 2 puffs first thing in am and then another 2 puffs about 12 hours later.  Work on inhaler technique:  relax and gently blow all the way out then take a nice smooth deep breath back in, triggering the inhaler at same time you start breathing in.  Pantoprazole  (protonix) 40 mg   Take  30-60 min before first meal of the day and Pepcid (famotidine)  20 mg one @  bedtime until return to office - this is the best way to tell whether stomach acid is contributing to your problem.   GERD diet  Please remember to go to the lab department downstairs in the basement  for your tests - we will call you with the results when they are available.    09/24/2017  f/u ov/[facility] re:  Chief Complaint  Patient presents with  . Follow-up    Breathing has improved some but not back to her normal baseline. She c/o nausea and vomitting- relates to symbicort so she d/c'ed.   Dyspnea:  MMRC1 = can walk nl pace, flat grade, can't hurry or go uphills or steps s sob  / showering is better Cough: none Sleeping: falt one pillow SABA use: none this week stopped symbicort 09/20/17  02: no   Diarrhea since 09/17/17 but none noct on ppi bid    No obvious day to day or daytime variability or assoc excess/ purulent sputum or mucus plugs or hemoptysis or cp or chest tightness, subjective wheeze or overt sinus or hb symptoms.   Sleeping as above  without nocturnal  or early am exacerbation  of respiratory  c/o's or need for noct saba. Also denies any obvious fluctuation of symptoms with weather or environmental changes or other aggravating or alleviating factors except as outlined above   No unusual exposure hx or h/o childhood pna/ asthma or knowledge of premature birth.  Current Allergies, Complete Past Medical History, Past Surgical History, Family History, and Social History were reviewed in [facility] [employer] record.  ROS  The following are not active complaints unless bolded Hoarseness, sore throat, dysphagia, dental problems, itching, sneezing,  nasal congestion or discharge of excess mucus or purulent secretions, ear ache,   fever, chills, sweats, unintended wt loss or wt gain, classically pleuritic or exertional cp,  orthopnea pnd or arm/hand swelling  or leg  swelling, presyncope, palpitations, abdominal pain, anorexia, nausea, vomiting, diarrhea  or change in bowel habits or change in bladder habits, change in stools or change in urine, dysuria, hematuria,  rash, arthralgias, visual complaints, headache, numbness, weakness or ataxia or problems  with walking or coordination,  change in mood or  memory.        Current Meds  Medication Sig  . albuterol (PROVENTIL HFA;VENTOLIN HFA) 108 (90 Base) MCG/ACT inhaler Inhale 2 puffs into the lungs every 6 (six) hours as needed for wheezing or shortness of breath.  Marland Kitchen albuterol (PROVENTIL) (5 MG/ML) 0.5% nebulizer solution Take 0.5 mLs (2.5 mg total) by nebulization every 6 (six) hours as needed for wheezing or shortness of breath.  . allopurinol (ZYLOPRIM) 300 MG tablet Take 300 mg by mouth every evening.   . Aspirin-Acetaminophen-Caffeine (EXCEDRIN MIGRAINE PO) Take by mouth as directed.  . Calcium Carb-Cholecalciferol [phone] MG-UNIT CAPS Take 2 capsules by mouth every evening.  . calcium carbonate (TUMS CHEWY BITES) 750 MG chewable tablet Chew 1 tablet by mouth as needed for heartburn.  . colchicine 0.6 MG tablet Take 0.6 mg by mouth daily as needed.  . famotidine (PEPCID) 20 MG tablet One at bedtime  . fexofenadine (ALLEGRA) 180 MG tablet Take 180 mg by mouth every evening.  . fluticasone (FLONASE) 50 MCG/ACT nasal spray Place 2 sprays into both nostrils every evening.  Marland Kitchen ibuprofen (ADVIL,MOTRIN) 200 MG tablet Take 800 mg by mouth every 6 (six) hours as needed for mild pain or moderate pain.  Marland Kitchen insulin lispro (HUMALOG) 100 UNIT/ML injection As directed  . KOMBIGLYZE XR 2.05-998 MG TB24 Take 1 tablet by mouth every evening.   . nicotine (NICODERM CQ - DOSED IN MG/24 HOURS) 14 mg/24hr patch Place 14 mg onto the skin daily as needed.  . pantoprazole (PROTONIX) 40 MG tablet Take 1 tablet (40 mg total) by mouth daily.  . rizatriptan (MAXALT) 5 MG tablet Take 5 mg by mouth as needed for migraine. May repeat in 2  hours if needed                              Objective:     amb obese wf nad    09/24/2017       292   09/16/17 297 lb (134.7 kg)  08/27/17 (!) 306 lb 3.5 oz (138.9 kg)  08/23/17 287 lb 14.7 oz (130.6 kg)      HEENT: nl dentition, turbinates bilaterally, and oropharynx. Nl external ear canals without cough reflex   NECK :  without JVD/Nodes/TM/ nl carotid upstrokes bilaterally   LUNGS: no acc muscle use,  Nl contour chest which is clear to A and P bilaterally without cough on insp or exp maneuvers   CV:  RRR  no s3 or murmur or increase in P2, and no edema   ABD:  Obese but soft and nontender with nl inspiratory excursion in the supine position. No bruits or organomegaly appreciated, bowel sounds nl  MS:  Nl gait/ ext warm without deformities, calf tenderness, cyanosis or clubbing No obvious joint restrictions   SKIN: warm and dry without lesions    NEURO:  alert, approp, nl sensorium with  no motor or cerebellar deficits apparent.        Assessment

## 2017-09-24 NOTE — Assessment & Plan Note (Addendum)
F/u  per pcp/ GI planned  In meantime rec Pantoprazole (protonix) 40 mg  Just  Take  One daily 30-60 min before first meal of the day and Pepcid (famotidine)  20 mg one @  bedtime as the higher dose ppi can cause diarrhea, another one of her complaints (though note only present daytime when eats, not noct)    Each maintenance medication was reviewed in detail including most importantly the difference between maintenance and as needed and under what circumstances the prns are to be used.  Please see AVS for specific  Instructions which are unique to this visit and I personally typed out  which were reviewed in detail in writing with the patient and a copy provided.

## 2017-09-24 NOTE — Patient Instructions (Addendum)
No evidence of asthma so ok to leave off symbicort permanently  Reduce protonix back to Take 30-60 min before first meal of the day until you See Dr Nevada Crane or your GI doctor and take pepcd 20 mg after supper    Only use your albuterol as a rescue medication to be used if you can't catch your breath by resting or doing a relaxed purse lip breathing pattern.  - The less you use it, the better it will work when you need it. - Ok to use up to 2 puffs  every 4 hours if you must but call for immediate appointment if use goes up over your usual need - Don't leave home without it !!  (think of it like the spare tire for your car)    Pulmonary follow up is as needed

## 2017-09-24 NOTE — Assessment & Plan Note (Signed)
Allergy profile 09/16/2017 >  Eos 0.2 /  IgE 53  RAST pos Dog > Cat and timothy grass - PFT's  09/23/2017  FEV1 3.78 (107 % ) ratio 85  p 6 % improvement from saba p nothing prior to study with DLCO  98 % corrects to 99 % for alv volume  And ERV 21  - Sinus CT 10/01/17 pending   I had an extended final summary discussion with the patient reviewing all relevant studies completed to date and  lasting 15 to 20 minutes of a 25 minute visit on the following issues:   This problem is improving off all inhalers s/p smoking cessation with obesity clearly affecting her lung volumes in a classic patter reviewed with her in detail with no clinical evidence for asthma or copd at this point and no need for further  pulmonary f/u   She could have asthma so needs to keep the saba handy   I spent extra time with pt today reviewing appropriate use of albuterol for prn use on exertion with the following points: 1) saba is for relief of sob that does not improve by walking a slower pace or resting but rather if the pt does not improve after trying this first. 2) If the pt is convinced, as many are, that saba helps recover from activity faster then it's easy to tell if this is the case by re-challenging : ie stop, take the inhaler, then p 5 minutes try the exact same activity (intensity of workload) that just caused the symptoms and see if they are substantially diminished or not after saba 3) if there is an activity that reproducibly causes the symptoms, try the saba 15 min before the activity on alternate days   If in fact the saba really does help, then fine to continue to use it prn but advised may need to look closer at the maintenance regimen being used to achieve better control of airways disease with exertion.

## 2017-09-24 NOTE — Assessment & Plan Note (Signed)
ERV 21% by pfts 09/23/17    Body mass index is 45.86 kg/m.  -  trending down  Lab Results  Component Value Date   TSH 2.368 01/08/2011     Contributing to gerd risk/ doe/reviewed the need and the process to achieve and maintain neg calorie balance > defer f/u primary care including intermittently monitoring thyroid status

## 2017-09-25 ENCOUNTER — Encounter (HOSPITAL_COMMUNITY): Payer: Self-pay

## 2017-09-25 ENCOUNTER — Other Ambulatory Visit (HOSPITAL_COMMUNITY): Payer: Self-pay | Admitting: Internal Medicine

## 2017-09-25 ENCOUNTER — Ambulatory Visit (HOSPITAL_COMMUNITY)
Admission: RE | Admit: 2017-09-25 | Discharge: 2017-09-25 | Disposition: A | Discharge status: Home / Self Care | Payer: 59 | Source: Ambulatory Visit | Attending: Internal Medicine | Admitting: Internal Medicine

## 2017-09-25 DIAGNOSIS — R7989 Other specified abnormal findings of blood chemistry: Secondary | ICD-10-CM

## 2017-09-25 DIAGNOSIS — R112 Nausea with vomiting, unspecified: Secondary | ICD-10-CM | POA: Diagnosis not present

## 2017-09-25 DIAGNOSIS — R1110 Vomiting, unspecified: Secondary | ICD-10-CM | POA: Diagnosis not present

## 2017-09-25 DIAGNOSIS — R945 Abnormal results of liver function studies: Secondary | ICD-10-CM | POA: Insufficient documentation

## 2017-09-25 DIAGNOSIS — K760 Fatty (change of) liver, not elsewhere classified: Secondary | ICD-10-CM | POA: Diagnosis not present

## 2017-09-25 LAB — POCT PREGNANCY, URINE: Preg Test, Ur: NEGATIVE

## 2017-09-25 MED ORDER — IOPAMIDOL (ISOVUE-300) INJECTION 61%
100.0000 mL | Freq: Once | INTRAVENOUS | Status: AC | PRN
  Administered 2017-09-25: 100 mL via INTRAVENOUS

## 2017-10-01 ENCOUNTER — Ambulatory Visit (HOSPITAL_COMMUNITY): Payer: 59

## 2017-10-06 DIAGNOSIS — E782 Mixed hyperlipidemia: Secondary | ICD-10-CM | POA: Diagnosis not present

## 2017-10-06 DIAGNOSIS — Z6841 Body Mass Index (BMI) 40.0 and over, adult: Secondary | ICD-10-CM | POA: Diagnosis not present

## 2017-10-06 DIAGNOSIS — R112 Nausea with vomiting, unspecified: Secondary | ICD-10-CM | POA: Diagnosis not present

## 2017-10-06 DIAGNOSIS — F17200 Nicotine dependence, unspecified, uncomplicated: Secondary | ICD-10-CM | POA: Diagnosis not present

## 2017-10-06 DIAGNOSIS — E1165 Type 2 diabetes mellitus with hyperglycemia: Secondary | ICD-10-CM | POA: Diagnosis not present

## 2017-10-06 DIAGNOSIS — K219 Gastro-esophageal reflux disease without esophagitis: Secondary | ICD-10-CM | POA: Diagnosis not present

## 2017-10-06 DIAGNOSIS — E119 Type 2 diabetes mellitus without complications: Secondary | ICD-10-CM | POA: Diagnosis not present

## 2017-10-06 DIAGNOSIS — I10 Essential (primary) hypertension: Secondary | ICD-10-CM | POA: Diagnosis not present

## 2017-10-06 DIAGNOSIS — K760 Fatty (change of) liver, not elsewhere classified: Secondary | ICD-10-CM | POA: Diagnosis not present

## 2017-10-06 DIAGNOSIS — N390 Urinary tract infection, site not specified: Secondary | ICD-10-CM | POA: Diagnosis not present

## 2017-10-06 DIAGNOSIS — J45909 Unspecified asthma, uncomplicated: Secondary | ICD-10-CM | POA: Diagnosis not present

## 2017-10-09 ENCOUNTER — Ambulatory Visit: Payer: 59 | Admitting: Nurse Practitioner

## 2017-10-09 ENCOUNTER — Encounter: Payer: Self-pay | Admitting: Nurse Practitioner

## 2017-10-09 ENCOUNTER — Ambulatory Visit (HOSPITAL_COMMUNITY)
Admission: RE | Admit: 2017-10-09 | Discharge: 2017-10-09 | Disposition: A | Discharge status: Home / Self Care | Payer: 59 | Source: Ambulatory Visit | Attending: Nurse Practitioner | Admitting: Nurse Practitioner

## 2017-10-09 VITALS — BP 120/82 | HR 69 | Temp 98.2°F | Ht 68.0 in | Wt 296.2 lb

## 2017-10-09 DIAGNOSIS — K760 Fatty (change of) liver, not elsewhere classified: Secondary | ICD-10-CM | POA: Diagnosis not present

## 2017-10-09 DIAGNOSIS — R945 Abnormal results of liver function studies: Secondary | ICD-10-CM | POA: Insufficient documentation

## 2017-10-09 DIAGNOSIS — R112 Nausea with vomiting, unspecified: Secondary | ICD-10-CM | POA: Insufficient documentation

## 2017-10-09 DIAGNOSIS — R7989 Other specified abnormal findings of blood chemistry: Secondary | ICD-10-CM

## 2017-10-09 DIAGNOSIS — R1011 Right upper quadrant pain: Secondary | ICD-10-CM | POA: Insufficient documentation

## 2017-10-09 DIAGNOSIS — R109 Unspecified abdominal pain: Secondary | ICD-10-CM | POA: Insufficient documentation

## 2017-10-09 NOTE — Progress Notes (Signed)
Primary Care Physician:  [name], MD Primary Gastroenterologist:  Dr. Gala Romney  Chief Complaint  Patient presents with  . elevated LFT's  . Nausea    vomiting about 2 weeks ago  . Abdominal Pain    ruq, comes/goes, stabbing pain    HPI:   Cheryl Black is a 32 y.o. female who presents on referral from primary care for elevated LFTs after phone consultation with one of our providers.  Reviewed information provided with the referral including office visit dated 10/06/2017.  Patient presented for medication check and also having right upper quadrant abdominal pain.  She also admitted nausea and vomiting.  Known fatty liver with elevated LFTs.  In their assessment for nausea and vomiting query pancreatitis, stat c-Met, lipase, amylase and prescribed promethazine recommended CT of the abdomen and pelvis.  CT of the abdomen and pelvis dated 09/25/2017 found hepatic steatosis with no focal liver abnormality, status post cholecystectomy and no biliary dilation noted on this exam.  Pancreas was unremarkable.  Overall, no acute intra-abdominal process, unchanged hepatic steatosis.  Viral hepatitis serologies completed 10/06/2017 found negative hepatitis A antibody, negative hepatitis C antibody, negative hepatitis B surface antigen and core antibody IgM.  Multiple series of LFTs with elevation of AST/ALT and alkaline phosphatase with normal bilirubin.  Trend as per below.  Last CBC dated 06/27/2017 with a normal white blood cell count of 9.1, mildly elevated hemoglobin of 15.2, normal platelets.  Her triglycerides were mildly high at 155.  19 2019 lipase normal at 30, amylase low at 25.   04/06/2017 06/25/2017 09/23/2017 10/06/2017  AST 80 131 320 401  ALT 143 92 271 351  Alk Phos 72 77 120 130  Bili-total 0.4 0.6 0.9 0.4   Today she states she's doing ok overall. She had her gallbladder out 2013 and had intraoperative liver biopsy. Findings included mild steatohepatitis. No major abdominal  problems since cholecystectomy. She had elevated LFTs at the time of her surgery. She had mild elevation earleir in the year deemed ADE from statin, dose was reduced and LFTs normalized; statin was reduced. She started having RUQ abdominal pain, intermittent, stabbing when it occurs; typically lasts 5-20 minutes. Is sore if palpated. Typically occurs 1-2 times a day. She first noticed the pain about a week ago. N/V started a couple weeks ago; no vomiting in the last week. Nausea is intermittent and brief. She thinks her N/V is related to new and increased doses of insulin recently. Denies hematochezia, melena, fever, chills, unintentional weight loss. Denies chest pain, dyspnea, dizziness, lightheadedness, syncope, near syncope. Denies any other upper or lower GI symptoms.  Take Excedrin Migraine rarely; about 1-2 times a year). Does take Ibuprofen occasionally, none in the past month.  Past Medical History:  Diagnosis Date  . Acid reflux   . Asthma   . Clostridium difficile infection    in the past  . Diabetes mellitus   . Fatty liver   . Fibrocystic breast disease   . Gout   . Hidradenitis   . History of kidney stones   . Hyperlipemia   . Obesity   . Polycystic ovary syndrome   . PONV (postoperative nausea and vomiting)   . Smoker     Past Surgical History:  Procedure Laterality Date  . CHOLECYSTECTOMY  01/10/2011   Procedure: LAPAROSCOPIC CHOLECYSTECTOMY;  Surgeon: Jamesetta So;  Location: [facility];  Service: General;  Laterality: N/A;  . HYDRADENITIS EXCISION Right 06/18/2012   Procedure: EXCISION  HYDRIADENITIS SUPRATIVA  RIGHT AXILLA;  Surgeon: Scherry Ran, MD;  Location: [facility];  Service: General;  Laterality: Right;  . LIVER BIOPSY  01/10/2011   Procedure: LIVER BIOPSY;  Surgeon: Jamesetta So;  Location: [facility];  Service: General;;  . SKIN SPLIT GRAFT Right 06/18/2012   Procedure: SKIN GRAFT SPLIT THICKNESS RIGHT AXILLA(WITH TWO STANDARD SKIN BOARDS 3"x 8" )  DONOR SITE  RIGHT AND LEFT THIGHS;  Surgeon: Scherry Ran, MD;  Location: [facility];  Service: General;  Laterality: Right;    Current Outpatient Medications  Medication Sig Dispense Refill  . albuterol (PROVENTIL HFA;VENTOLIN HFA) 108 (90 Base) MCG/ACT inhaler Inhale 2 puffs into the lungs every 6 (six) hours as needed for wheezing or shortness of breath. 1 Inhaler 2  . albuterol (PROVENTIL) (5 MG/ML) 0.5% nebulizer solution Take 0.5 mLs (2.5 mg total) by nebulization every 6 (six) hours as needed for wheezing or shortness of breath. 20 mL 0  . allopurinol (ZYLOPRIM) 300 MG tablet Take 300 mg by mouth every evening.     . Aspirin-Acetaminophen-Caffeine (EXCEDRIN MIGRAINE PO) Take by mouth as directed.    . Calcium Carb-Cholecalciferol [phone] MG-UNIT CAPS Take 2 capsules by mouth every evening.    . calcium carbonate (TUMS CHEWY BITES) 750 MG chewable tablet Chew 1 tablet by mouth as needed for heartburn.    . colchicine 0.6 MG tablet Take 0.6 mg by mouth daily as needed.    . fexofenadine (ALLEGRA) 180 MG tablet Take 180 mg by mouth every evening.    . fluticasone (FLONASE) 50 MCG/ACT nasal spray Place 2 sprays into both nostrils every evening.    Marland Kitchen ibuprofen (ADVIL,MOTRIN) 200 MG tablet Take 800 mg by mouth every 6 (six) hours as needed for mild pain or moderate pain.    . Insulin Glargine-Lixisenatide (SOLIQUA) 100-33 UNT-MCG/ML SOPN Inject 22 Units into the skin at bedtime.    . insulin lispro (HUMALOG) 100 UNIT/ML injection Sliding scale 4 times a day    . KOMBIGLYZE XR 2.05-998 MG TB24 Take 1 tablet by mouth 2 (two) times daily.   11  . nicotine (NICODERM CQ - DOSED IN MG/24 HOURS) 14 mg/24hr patch Place 14 mg onto the skin daily as needed.    . pantoprazole (PROTONIX) 40 MG tablet Take 1 tablet (40 mg total) by mouth daily. 30 tablet 2  . rizatriptan (MAXALT) 5 MG tablet Take 5 mg by mouth as needed for migraine. May repeat in 2 hours if needed     No current facility-administered medications  for this visit.     Allergies as of 10/09/2017 - Review Complete 10/09/2017  Allergen Reaction Noted  . Ace inhibitors Anaphylaxis 03/09/2016  . Beta adrenergic blockers Other (See Comments) 10/21/2016  . Augmentin [amoxicillin-pot clavulanate] Other (See Comments) 03/03/2016  . Benicar [olmesartan] Other (See Comments) 06/08/2012  . Doxycycline Other (See Comments) 06/08/2012  . Eggs or egg-derived products  09/16/2017  . Influenza vaccine live Swelling 10/13/2010  . Invokana [canagliflozin] Other (See Comments) 03/03/2016    Family History  Problem Relation Age of Onset  . Stroke Other        paternal great grandfather  . Diabetes Father   . Hypertension Father   . Cancer Maternal Grandfather        adenocarcinoma  . Emphysema Maternal Grandfather        smoked  . Heart attack Paternal Grandfather   . Hypertension Paternal Grandmother   . Diabetes Paternal Grandmother   .  Gout Paternal Grandmother   . Hypertension Maternal Grandmother   . Other Mother        pre-cancerous cells; had hyst  . Emphysema Mother        smoker  . Gout Brother   . Cancer Sister        cervical; had hyst  . Asthma Maternal Uncle   . Colon cancer Neg Hx   . Liver disease Neg Hx   . Liver cancer Neg Hx     Social History   Socioeconomic History  . Marital status: Married    Spouse name: Not on file  . Number of children: Not on file  . Years of education: Not on file  . Highest education level: Not on file  Occupational History  . Occupation: nurse  Social Needs  . Financial resource strain: Not hard at all  . Food insecurity:    Worry: Never true    Inability: Never true  . Transportation needs:    Medical: No    Non-medical: No  Tobacco Use  . Smoking status: Former Smoker    Packs/day: 1.00    Years: 10.00    Pack years: 10.00    Types: Cigarettes    Last attempt to quit: 08/23/2017    Years since quitting: 0.1  . Smokeless tobacco: Never Used  Substance and Sexual  Activity  . Alcohol use: Yes    Comment: occas  . Drug use: No  . Sexual activity: Yes    Birth control/protection: None  Lifestyle  . Physical activity:    Days per week: Not on file    Minutes per session: Not on file  . Stress: Not on file  Relationships  . Social connections:    Talks on phone: More than three times a week    Gets together: Once a week    Attends religious service: More than 4 times per year    Active member of club or organization: No    Attends meetings of clubs or organizations: Never    Relationship status: Married  . Intimate partner violence:    Fear of current or ex partner: No    Emotionally abused: No    Physically abused: No    Forced sexual activity: No  Other Topics Concern  . 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 ENT: Negative for hoarseness, difficulty swallowing. CV: Negative for chest pain, angina, palpitations, peripheral edema.  Respiratory: Negative for dyspnea at rest, cough, sputum, wheezing.  GI: See history of present illness. MS: Negative for joint pain, low back pain.  Derm: Negative for rash or itching.  Endo: Negative for unusual weight change.  Heme: Negative for bruising or bleeding. Allergy: Negative for rash or hives.    Physical Exam: BP 120/82   Pulse 69   Temp 98.2 F (36.8 C) (Oral)   Ht 5' 8"  (1.727 m)   Wt 296 lb 3.2 oz (134.4 kg)   LMP 09/19/2017   BMI 45.04 kg/m  General:   Alert and oriented. Pleasant and cooperative. Well-nourished and well-developed.  Head:  Normocephalic and atraumatic. Eyes:  Without icterus, sclera clear and conjunctiva pink.  Ears:  Normal auditory acuity. Cardiovascular:  S1, S2 present without murmurs appreciated. Extremities without clubbing or edema. Respiratory:  Clear to auscultation bilaterally. No wheezes, rales, or rhonchi. No distress.  Gastrointestinal:  +BS, soft, non-tender and non-distended. No HSM noted. No guarding or rebound. No masses appreciated.  Rectal:  Deferred  Musculoskalatal:  Symmetrical without gross deformities. Neurologic:  Alert and oriented x4;  grossly normal neurologically. Psych:  Alert and cooperative. Normal mood and affect. Heme/Lymph/Immune: No excessive bruising noted.    10/09/2017 11:22 AM   Disclaimer: This note was dictated with voice recognition software. Similar sounding words can inadvertently be transcribed and may not be corrected upon review.

## 2017-10-09 NOTE — Assessment & Plan Note (Signed)
The patient previously had mildly elevated LFTs earlier this year with a statin.  Her LFTs normalized after dose reduction and elimination of statin medication.  She did have elevated LFTs during her cholecystectomy timeframe with an intraoperative liver biopsy showing mild steatohepatitis.  CT imaging without acute findings, noted persistent but unchanged fatty liver disease.  Her LFTs were last elevated on 04/06/2017.  They have progressively become more elevated over the previous 6 months, as per HPI.  Her last reading found AST/ALT of 401/351, elevated alk phos was 130 but preserved total bilirubin.  At this point we will check a stat right upper quadrant ultrasound for any biliary abnormalities, common bile duct dilation.  I will recheck CBC, CMP, lipase.  Query possible common bile duct stone.  If she does have ductal dilation we will schedule an MRCP and possibly ERCP.  If no ductal dilation we will proceed with autoimmune serologies.  Her hepatitis viral serologies were negative.  We can also consider checking INR, iron panel, ceruloplasmin pending results.  The recommendations to follow results.  Follow-up in 4 weeks.

## 2017-10-09 NOTE — Patient Instructions (Signed)
1. Have your labs completed when you are able to. 2. We will schedule your ultrasound for you. 3. Further recommendations to follow. 4. Return for follow-up in 4 weeks. 5. Call us if you have any questions or concerns.  At [facility] we value your feedback. You may receive a survey about your visit today. Please share your experience as we strive to create trusting relationships with our patients to provide genuine, compassionate, quality care.  We appreciate your understanding and patience as we review any laboratory studies, imaging, and other diagnostic tests that are ordered as we care for you. Our office policy is 5 business days for review of these results, and any emergent or urgent results are addressed in a timely manner for your best interest. If you do not hear from our office in 1 week, please contact us.   We also encourage the use of MyChart, which contains your medical information for your review as well. If you are not enrolled in this feature, an access code is on this after visit summary for your convenience. Thank you for allowing [name] to be involved in your care.  It was great to meet you today!  I hope you have a great Fall!!

## 2017-10-09 NOTE — Assessment & Plan Note (Signed)
Intermittent right upper quadrant abdominal pain which is brief, lasting typically 5 to 15 minutes, stabbing, self resolves.  This is been ongoing for a couple few weeks.  She is status post cholecystectomy in 2013.  She does have significantly elevated liver enzymes as per above.  Query hepatic or biliary origin.  Further work-up as per above.  Follow-up in 4 weeks, further recommendations to follow.

## 2017-10-09 NOTE — Assessment & Plan Note (Signed)
Brief and self resolving nausea and vomiting over the past 2 to 3 weeks.  She has not had any vomiting in 2 weeks.  Her nausea is persistent, not particularly bothersome and tends to last about 5 to 10 minutes.  This is in the setting of right upper quadrant intermittent abdominal pain and significantly elevated LFTs.  Status post cholecystectomy in 2013.  She was prescribed Phenergan by her primary care.  Further work-up as per below.  Take Phenergan as needed.  Call if any worsening symptoms before follow-up.  Follow-up in 4 weeks.

## 2017-10-10 LAB — COMPREHENSIVE METABOLIC PANEL
AG RATIO: 1.5 (calc) (ref 1.0–2.5)
ALT: 264 U/L — ABNORMAL HIGH (ref 6–29)
AST: 156 U/L — AB (ref 10–30)
Albumin: 3.9 g/dL (ref 3.6–5.1)
Alkaline phosphatase (APISO): 106 U/L (ref 33–115)
BUN: 7 mg/dL (ref 7–25)
CHLORIDE: 102 mmol/L (ref 98–110)
CO2: 24 mmol/L (ref 20–32)
Calcium: 9 mg/dL (ref 8.6–10.2)
Creat: 0.59 mg/dL (ref 0.50–1.10)
GLUCOSE: 231 mg/dL — AB (ref 65–139)
Globulin: 2.6 g/dL (calc) (ref 1.9–3.7)
Potassium: 4.2 mmol/L (ref 3.5–5.3)
SODIUM: 134 mmol/L — AB (ref 135–146)
TOTAL PROTEIN: 6.5 g/dL (ref 6.1–8.1)
Total Bilirubin: 1 mg/dL (ref 0.2–1.2)

## 2017-10-10 LAB — CBC WITH DIFFERENTIAL/PLATELET
Basophils Absolute: 48 {cells}/uL (ref 0–200)
Basophils Relative: 0.7 %
Eosinophils Absolute: 190 {cells}/uL (ref 15–500)
Eosinophils Relative: 2.8 %
HCT: 45.1 % — ABNORMAL HIGH (ref 35.0–45.0)
Hemoglobin: 15.4 g/dL (ref 11.7–15.5)
Lymphs Abs: 1455 {cells}/uL (ref 850–3900)
MCH: 29.8 pg (ref 27.0–33.0)
MCHC: 34.1 g/dL (ref 32.0–36.0)
MCV: 87.2 fL (ref 80.0–100.0)
MPV: 11.6 fL (ref 7.5–12.5)
Monocytes Relative: 7.1 %
Neutro Abs: 4624 {cells}/uL (ref 1500–7800)
Neutrophils Relative %: 68 %
Platelets: 232 Thousand/uL (ref 140–400)
RBC: 5.17 Million/uL — ABNORMAL HIGH (ref 3.80–5.10)
RDW: 12.4 % (ref 11.0–15.0)
Total Lymphocyte: 21.4 %
WBC mixed population: 483 {cells}/uL (ref 200–950)
WBC: 6.8 Thousand/uL (ref 3.8–10.8)

## 2017-10-10 LAB — LIPASE: Lipase: 18 U/L (ref 7–60)

## 2017-10-12 NOTE — Progress Notes (Signed)
CC'D TO PCP °

## 2017-10-15 ENCOUNTER — Other Ambulatory Visit: Payer: Self-pay | Admitting: Nurse Practitioner

## 2017-10-15 DIAGNOSIS — R945 Abnormal results of liver function studies: Principal | ICD-10-CM

## 2017-10-15 DIAGNOSIS — R7989 Other specified abnormal findings of blood chemistry: Secondary | ICD-10-CM

## 2017-10-15 MED FILL — UNIFINE PENTIPS 31GX3/16": 31G X 5 MM | 90 days supply | Qty: 100 | Fill #0 | Status: TO

## 2017-10-15 MED FILL — HUMALOG 100 UNITS/ML KWIKPE: 100 | 30 days supply | Qty: 12 | Fill #0

## 2017-10-15 MED FILL — UNIFINE PENTIPS 31GX3/16: 31G X 5 MM | 90 days supply | Qty: 100 | Fill #0 | Status: TO

## 2017-10-15 NOTE — Progress Notes (Signed)
Additional labs for elevated LFTs

## 2017-10-16 ENCOUNTER — Ambulatory Visit: Payer: 59 | Admitting: Internal Medicine

## 2017-10-19 MED FILL — KOMBIGLYZE XR 2.5-1,000 MG: 2.5-1000 | 30 days supply | Qty: 30 | Fill #3

## 2017-10-20 MED FILL — ALLOPURINOL 300 MG TABS: 300 | 30 days supply | Qty: 30 | Fill #0

## 2017-10-23 DIAGNOSIS — E1165 Type 2 diabetes mellitus with hyperglycemia: Secondary | ICD-10-CM | POA: Diagnosis not present

## 2017-10-23 DIAGNOSIS — J45909 Unspecified asthma, uncomplicated: Secondary | ICD-10-CM | POA: Diagnosis not present

## 2017-10-23 DIAGNOSIS — F17200 Nicotine dependence, unspecified, uncomplicated: Secondary | ICD-10-CM | POA: Diagnosis not present

## 2017-10-23 DIAGNOSIS — E119 Type 2 diabetes mellitus without complications: Secondary | ICD-10-CM | POA: Diagnosis not present

## 2017-10-23 DIAGNOSIS — R112 Nausea with vomiting, unspecified: Secondary | ICD-10-CM | POA: Diagnosis not present

## 2017-10-23 DIAGNOSIS — R945 Abnormal results of liver function studies: Secondary | ICD-10-CM | POA: Diagnosis not present

## 2017-10-23 DIAGNOSIS — E782 Mixed hyperlipidemia: Secondary | ICD-10-CM | POA: Diagnosis not present

## 2017-10-23 DIAGNOSIS — I10 Essential (primary) hypertension: Secondary | ICD-10-CM | POA: Diagnosis not present

## 2017-10-23 DIAGNOSIS — K219 Gastro-esophageal reflux disease without esophagitis: Secondary | ICD-10-CM | POA: Diagnosis not present

## 2017-10-23 DIAGNOSIS — Z6841 Body Mass Index (BMI) 40.0 and over, adult: Secondary | ICD-10-CM | POA: Diagnosis not present

## 2017-10-23 DIAGNOSIS — N390 Urinary tract infection, site not specified: Secondary | ICD-10-CM | POA: Diagnosis not present

## 2017-10-23 MED FILL — SOLIQUA 100 UNIT-33 MCG/ML: 100-33 | 25 days supply | Qty: 15 | Fill #0

## 2017-11-12 ENCOUNTER — Ambulatory Visit: Payer: 59 | Admitting: Gastroenterology

## 2017-11-12 ENCOUNTER — Encounter: Payer: Self-pay | Admitting: Gastroenterology

## 2017-11-12 VITALS — BP 118/85 | HR 64 | Temp 97.5°F | Ht 68.0 in | Wt 297.4 lb

## 2017-11-12 DIAGNOSIS — R1011 Right upper quadrant pain: Secondary | ICD-10-CM

## 2017-11-12 DIAGNOSIS — R945 Abnormal results of liver function studies: Secondary | ICD-10-CM | POA: Diagnosis not present

## 2017-11-12 DIAGNOSIS — R112 Nausea with vomiting, unspecified: Secondary | ICD-10-CM | POA: Diagnosis not present

## 2017-11-12 DIAGNOSIS — R7989 Other specified abnormal findings of blood chemistry: Secondary | ICD-10-CM

## 2017-11-12 NOTE — Progress Notes (Signed)
Primary Care Physician: [name], MD  Primary Gastroenterologist:  Garfield Cornea, MD   Chief Complaint  Patient presents with  . Nausea  . Abdominal Pain    stabbing pains ruq    HPI: Cheryl Black is a 32 y.o. female here for follow-up of abnormal LFTs.  She was last seen October 09, 2017.  Patient was hospitalized in 08/2017 with bilateral PNA. She was on steroids and antibiotics. Was taken out of work and remains out of work at this time. Few weeks later she started having RUQ pain, N/V. Seen by PCP. Labs showed increase from baseline abnormal LFTs. She did not have LFTs done in 08/2017 while hospitalized however she had labs back in March in June of this year and some 01/26/2016 as outlined in the table below.    She had a CT of the abdomen pelvis with contrast on September 25, 2017 showing hepatic steatosis, previous cholecystectomy, no biliary dilation.  Pancreas unremarkable.  No acute intra-abdominal process.  He states she was told that her pancreas was inflamed.  I cannot find any imaging studies to suggest this, her lipase has been normal.  She has noted increasing difficulty with controlling her diabetes.  However A1c increased only after her hospitalization in August with pneumonia which time she received steroids.  Her to her illness her diabetes was well controlled with oral medication only, now on 2 different insulins.  She had right upper quadrant ultrasound October 4 showing fatty liver, CBD 4.5 mm.  Initially had pretty significant right upper quadrant pain associated with vomiting.  Not necessarily related to meals.  At this time her symptoms are 50% improved.  Episodes for short-lived usually only a few minutes.  Continues to have more frequent nausea but not necessarily vomiting.  Bowel movements are regular.  No blood in stool or melena.  Heartburn well controlled on PPI.  She has a known history of fatty liver with mildly elevated LFTs in the past.  She had a  liver biopsy at time of cholecystectomy in 2013 showing mild steatohepatitis.  No significant iron stores on that biopsy.     Viral hepatitis serologies negative last month.  Quit smoking in August after presenting with pneumonia.    03/09/2016  04/06/2017  06/25/2017  09/23/2017  10/06/2017  10/23/2017  AST  50  80  131  320  401  176  ALT  92  143  92  271  351  228  Alk phos  78  72  77  120  130  135  Bili-total  0.2  0.4  0.6  0.9  0.4  0.4   Additional labs on 10/23/2016, ANA negative, anti-smooth muscle, mitochondrial antibody negative.  Hemoglobin A1c 6.6 in June, 7.1 in August, 9.7 in September.    Current Outpatient Medications  Medication Sig Dispense Refill  . albuterol (PROVENTIL HFA;VENTOLIN HFA) 108 (90 Base) MCG/ACT inhaler Inhale 2 puffs into the lungs every 6 (six) hours as needed for wheezing or shortness of breath. 1 Inhaler 2  . albuterol (PROVENTIL) (5 MG/ML) 0.5% nebulizer solution Take 0.5 mLs (2.5 mg total) by nebulization every 6 (six) hours as needed for wheezing or shortness of breath. 20 mL 0  . allopurinol (ZYLOPRIM) 300 MG tablet Take 300 mg by mouth every evening.     . Aspirin-Acetaminophen-Caffeine (EXCEDRIN MIGRAINE PO) Take by mouth as directed.    . Calcium Carb-Cholecalciferol [phone] MG-UNIT CAPS Take 2 capsules by mouth  every evening.    . calcium carbonate (TUMS CHEWY BITES) 750 MG chewable tablet Chew 1 tablet by mouth as needed for heartburn.    . colchicine 0.6 MG tablet Take 0.6 mg by mouth daily as needed.    . fexofenadine (ALLEGRA) 180 MG tablet Take 180 mg by mouth every evening.    . fluticasone (FLONASE) 50 MCG/ACT nasal spray Place 2 sprays into both nostrils every evening.    Marland Kitchen ibuprofen (ADVIL,MOTRIN) 200 MG tablet Take 800 mg by mouth every 6 (six) hours as needed for mild pain or moderate pain.    . Insulin Glargine-Lixisenatide (SOLIQUA) 100-33 UNT-MCG/ML SOPN Inject 38 Units into the skin at bedtime.     . insulin lispro (HUMALOG) 100  UNIT/ML injection Sliding scale 4 times a day    . KOMBIGLYZE XR 2.05-998 MG TB24 Take 1 tablet by mouth 2 (two) times daily.   11  . pantoprazole (PROTONIX) 40 MG tablet Take 1 tablet (40 mg total) by mouth daily. 30 tablet 2  . rizatriptan (MAXALT) 5 MG tablet Take 5 mg by mouth as needed for migraine. May repeat in 2 hours if needed     No current facility-administered medications for this visit.     Allergies as of 11/12/2017 - Review Complete 11/12/2017  Allergen Reaction Noted  . Ace inhibitors Anaphylaxis 03/09/2016  . Beta adrenergic blockers Other (See Comments) 10/21/2016  . Augmentin [amoxicillin-pot clavulanate] Other (See Comments) 03/03/2016  . Benicar [olmesartan] Other (See Comments) 06/08/2012  . Doxycycline Other (See Comments) 06/08/2012  . Eggs or egg-derived products  09/16/2017  . Influenza vaccine live Swelling 10/13/2010  . Invokana [canagliflozin] Other (See Comments) 03/03/2016    ROS:  General: Negative for anorexia, weight loss, fever, chills, fatigue, weakness. ENT: Negative for hoarseness, difficulty swallowing , nasal congestion. CV: Negative for chest pain, angina, palpitations, dyspnea on exertion, peripheral edema.  Respiratory: Negative for dyspnea at rest, dyspnea on exertion, cough, sputum, wheezing.  GI: See history of present illness. GU:  Negative for dysuria, hematuria, urinary incontinence, urinary frequency, nocturnal urination.  Endo: Negative for unusual weight change.    Physical Examination:   BP 118/85   Pulse 64   Temp (!) 97.5 F (36.4 C) (Oral)   Ht '5\' 8"'$  (1.727 m)   Wt 297 lb 6.4 oz (134.9 kg)   LMP 10/19/2017 (Exact Date)   BMI 45.22 kg/m   General: Well-nourished, well-developed in no acute distress.  Eyes: No icterus. Mouth: Oropharyngeal mucosa moist and pink , no lesions erythema or exudate. Lungs: Clear to auscultation bilaterally.  Heart: Regular rate and rhythm, no murmurs rubs or gallops.  Abdomen: Bowel  sounds are normal, mild right upper quadrant tenderness, nondistended, no hepatosplenomegaly or masses, no abdominal bruits or hernia , no rebound or guarding.   Extremities: No lower extremity edema. No clubbing or deformities. Neuro: Alert and oriented x 4   Skin: Warm and dry, no jaundice.   Psych: Alert and cooperative, normal mood and affect.

## 2017-11-12 NOTE — Assessment & Plan Note (Signed)
Very pleasant 32 year old female with presenting for follow-up of right upper quadrant pain associated with nausea/vomiting, recent significant increase in LFTs.  She has known history of mild steatohepatitis on prior liver biopsy in 2013.  Last year her AST 50/ALT 92 with normal alkaline phosphatase and total bilirubin.  In June AST 131/ALT 92.  Since onset of right upper quadrant pain with vomiting, her LFTs significantly increased with max of AST 401/ALT 351, alkaline phosphatase 130.  ANA, ASMA, AMA all negative.  Alpha antitrypsin serum level and phenotype normal.  Viral markers negative.  Clinically she has improved with less abdominal pain, episodes are short-lived.  She still has some nausea.  With her illness since August, her A1c has increased from 6.6-9.7, insulin has been added.  It is unclear if bump in AST/ALT are related to her right upper quadrant pain versus decline in fatty liver in the setting of recent illness and poorly controlled diabetes.  She has not demonstrated significant elevation in alkaline phosphatase or total bilirubin or biliary dilation which would be more expected in case of biliary obstruction.   At this point we will recheck her LFTs as it has been 3 weeks.  We will also rule out Wilson's disease and hemochromatosis.  Based on results she may need an additional liver biopsy versus MRI/MRCP.

## 2017-11-12 NOTE — Patient Instructions (Signed)
1. Please have your labs done.  We will contact you within 5 business days with results.  Based on findings we will decide next step as far as if you need MRI versus liver biopsy. 2. We know you have underlying fatty liver.  It is important that you control your diabetes is much as possible as this will affect her fatty liver.  See information below as well.   Fatty Liver Fatty liver, also called hepatic steatosis or steatohepatitis, is a condition in which too much fat has built up in your liver cells. The liver removes harmful substances from your bloodstream. It produces fluids your body needs. It also helps your body use and store energy from the food you eat. In many cases, fatty liver does not cause symptoms or problems. It is often diagnosed when tests are being done for other reasons. However, over time, fatty liver can cause inflammation that may lead to more serious liver problems, such as scarring of the liver (cirrhosis). What are the causes? Causes of fatty liver may include:  Drinking too much alcohol.  Poor nutrition.  Obesity.  Cushing syndrome.  Diabetes.  Hyperlipidemia.  Pregnancy.  Certain drugs.  Poisons.  Some viral infections.  What increases the risk? You may be more likely to develop fatty liver if you:  Abuse alcohol.  Are pregnant.  Are overweight.  Have diabetes.  Have hepatitis.  Have a high triglyceride level.  What are the signs or symptoms? Fatty liver often does not cause any symptoms. In cases where symptoms develop, they can include:  Fatigue.  Weakness.  Weight loss.  Confusion.  Abdominal pain.  Yellowing of your skin and the white parts of your eyes (jaundice).  Nausea and vomiting.  How is this diagnosed? Fatty liver may be diagnosed by:  Physical exam and medical history.  Blood tests.  Imaging tests, such as an ultrasound, CT scan, or MRI.  Liver biopsy. A small sample of liver tissue is removed using a  needle. The sample is then looked at under a microscope.  How is this treated? Fatty liver is often caused by other health conditions. Treatment for fatty liver may involve medicines and lifestyle changes to manage conditions such as:  Alcoholism.  High cholesterol.  Diabetes.  Being overweight or obese.  Follow these instructions at home:  Eat a healthy diet as directed by your health care provider.  Exercise regularly. This can help you lose weight and control your cholesterol and diabetes. Talk to your health care provider about an exercise plan and which activities are best for you.  Do not drink alcohol.  Take medicines only as directed by your health care provider. Contact a health care provider if: You have difficulty controlling your:  Blood sugar.  Cholesterol.  Alcohol consumption.  Get help right away if:  You have abdominal pain.  You have jaundice.  You have nausea and vomiting. This information is not intended to replace advice given to you by your health care provider. Make sure you discuss any questions you have with your health care provider. Document Released: 02/07/2005 Document Revised: 05/31/2015 Document Reviewed: 05/04/2013 Elsevier Interactive Patient Education  [employer].

## 2017-11-13 LAB — HEPATIC FUNCTION PANEL
AG Ratio: 1.3 (calc) (ref 1.0–2.5)
ALKALINE PHOSPHATASE (APISO): 92 U/L (ref 33–115)
ALT: 272 U/L — ABNORMAL HIGH (ref 6–29)
AST: 196 U/L — ABNORMAL HIGH (ref 10–30)
Albumin: 3.8 g/dL (ref 3.6–5.1)
Bilirubin, Direct: 0.1 mg/dL (ref 0.0–0.2)
GLOBULIN: 2.9 g/dL (ref 1.9–3.7)
Indirect Bilirubin: 0.5 mg/dL (calc) (ref 0.2–1.2)
Total Bilirubin: 0.6 mg/dL (ref 0.2–1.2)
Total Protein: 6.7 g/dL (ref 6.1–8.1)

## 2017-11-13 LAB — CREATININE, SERUM: Creat: 0.6 mg/dL (ref 0.50–1.10)

## 2017-11-13 LAB — IGG, IGA, IGM
IGM, SERUM: 114 mg/dL (ref 50–300)
IgG (Immunoglobin G), Serum: 1223 mg/dL (ref 600–1640)
Immunoglobulin A: 317 mg/dL — ABNORMAL HIGH (ref 47–310)

## 2017-11-13 LAB — IRON,TIBC AND FERRITIN PANEL
%SAT: 25 % (calc) (ref 16–45)
Ferritin: 344 ng/mL — ABNORMAL HIGH (ref 16–154)
IRON: 92 ug/dL (ref 40–190)
TIBC: 369 mcg/dL (calc) (ref 250–450)

## 2017-11-13 LAB — CERULOPLASMIN: CERULOPLASMIN: 30 mg/dL (ref 18–53)

## 2017-11-16 ENCOUNTER — Other Ambulatory Visit (HOSPITAL_BASED_OUTPATIENT_CLINIC_OR_DEPARTMENT_OTHER): Payer: Self-pay

## 2017-11-16 DIAGNOSIS — E1169 Type 2 diabetes mellitus with other specified complication: Secondary | ICD-10-CM | POA: Diagnosis not present

## 2017-11-16 DIAGNOSIS — R0683 Snoring: Secondary | ICD-10-CM | POA: Diagnosis not present

## 2017-11-16 DIAGNOSIS — G4710 Hypersomnia, unspecified: Secondary | ICD-10-CM

## 2017-11-16 DIAGNOSIS — Z8709 Personal history of other diseases of the respiratory system: Secondary | ICD-10-CM | POA: Diagnosis not present

## 2017-11-16 DIAGNOSIS — G478 Other sleep disorders: Secondary | ICD-10-CM | POA: Diagnosis not present

## 2017-11-16 DIAGNOSIS — Z794 Long term (current) use of insulin: Secondary | ICD-10-CM | POA: Diagnosis not present

## 2017-11-16 DIAGNOSIS — Z6841 Body Mass Index (BMI) 40.0 and over, adult: Secondary | ICD-10-CM | POA: Diagnosis not present

## 2017-11-17 ENCOUNTER — Ambulatory Visit: Payer: 59 | Attending: Internal Medicine | Admitting: Internal Medicine

## 2017-11-17 DIAGNOSIS — R0683 Snoring: Secondary | ICD-10-CM | POA: Insufficient documentation

## 2017-11-17 DIAGNOSIS — G4730 Sleep apnea, unspecified: Secondary | ICD-10-CM | POA: Diagnosis not present

## 2017-11-17 DIAGNOSIS — G4733 Obstructive sleep apnea (adult) (pediatric): Secondary | ICD-10-CM | POA: Diagnosis not present

## 2017-11-17 DIAGNOSIS — I10 Essential (primary) hypertension: Secondary | ICD-10-CM | POA: Diagnosis not present

## 2017-11-17 DIAGNOSIS — G4710 Hypersomnia, unspecified: Secondary | ICD-10-CM | POA: Diagnosis not present

## 2017-11-17 DIAGNOSIS — E782 Mixed hyperlipidemia: Secondary | ICD-10-CM | POA: Diagnosis not present

## 2017-11-17 DIAGNOSIS — E1165 Type 2 diabetes mellitus with hyperglycemia: Secondary | ICD-10-CM | POA: Diagnosis not present

## 2017-11-17 NOTE — Progress Notes (Signed)
CC'D TO PCP °

## 2017-11-20 DIAGNOSIS — E1165 Type 2 diabetes mellitus with hyperglycemia: Secondary | ICD-10-CM | POA: Diagnosis not present

## 2017-11-20 DIAGNOSIS — M109 Gout, unspecified: Secondary | ICD-10-CM | POA: Diagnosis not present

## 2017-11-20 DIAGNOSIS — R945 Abnormal results of liver function studies: Secondary | ICD-10-CM | POA: Diagnosis not present

## 2017-11-20 DIAGNOSIS — J45909 Unspecified asthma, uncomplicated: Secondary | ICD-10-CM | POA: Diagnosis not present

## 2017-11-20 DIAGNOSIS — E782 Mixed hyperlipidemia: Secondary | ICD-10-CM | POA: Diagnosis not present

## 2017-11-20 MED FILL — HUMALOG 100 UNITS/ML KWIKPE: 100 | 30 days supply | Qty: 18 | Fill #0

## 2017-11-23 MED FILL — UNIFINE PENTIPS 31GX3/16": 31G X 5 MM | 90 days supply | Qty: 100 | Fill #0

## 2017-11-23 MED FILL — KOMBIGLYZE XR 2.5-1,000 MG: 2.5-1000 | 30 days supply | Qty: 30 | Fill #4

## 2017-11-23 MED FILL — ALLOPURINOL 300 MG TABS: 300 | 30 days supply | Qty: 30 | Fill #1

## 2017-11-23 MED FILL — UNIFINE PENTIPS 31GX3/16: 31G X 5 MM | 90 days supply | Qty: 100 | Fill #0

## 2017-11-24 MED FILL — SOLIQUA 100 UNIT-33 MCG/ML: 100-33 | 25 days supply | Qty: 15 | Fill #0

## 2017-11-27 NOTE — Procedures (Signed)
    NAME: Cheryl Black DATE OF BIRTH:  01/16/1985 MEDICAL RECORD NUMBER [id number]  LOCATION: [facility]  PHYSICIAN: Deretha EmoryJames C Jayin Derousse  DATE OF STUDY: 11/17/2017  SLEEP STUDY TYPE: Out of Center Sleep Test                REFERRING PHYSICIAN: Deretha Emorysborne, [name], MD  INDICATION FOR STUDY: excessive sleepiness, awakening gasping for breath, snoring  EPWORTH SLEEPINESS SCORE:  7 HEIGHT:    WEIGHT:      There is no height or weight on file to calculate BMI.  NECK SIZE:   in.  Conclusions: 1. Moderate sleep apnea (Respiratory Event Index (REI) 20/hr) - please note that REI approximates AHI but is not identical as a HST measures time of use and not time of sleep. 2. Moderate desaturations (min O2 sat: 83%)  Recommendations: 1. The patient should be treated with weight loss (if appropriate), an oral appliance, or CPAP. If CPAP is chosen, this could be started by auto-CPAP or by CPAP titration in the lab.  2. Please note that a HST may underestimate the degree of obstructive sleep apnea due to the lack of EEG data. Therefore, respiratory effort related arousals (RERAs) will not be measured. If a prominent part of the patient's sleep disordered breathing are RERAs, then the HST will understate the severity of the patient's obstructive sleep apnea.   This study is a Type III Home Sleep Apnea Test measuring respiratory effort, airflow, heart rate, and oxygen saturation.   I certify that I have reviewed the entire raw data recording prior to the issuance of this report in accordance with the Standards of the American Academy of Sleep Medicine (AASM).     Deretha EmoryJames C [name] Sleep specialist, American Board of Internal Medicine  ELECTRONICALLY SIGNED ON:  11/27/2017, 9:10 PM [facility] PH: [phone]   FX: [phone]) [phone] ACCREDITED BY THE AMERICAN ACADEMY OF SLEEP MEDICINE

## 2017-12-07 MED FILL — UNIFINE PENTIPS 31GX3/16: 31G X 5 MM | 20 days supply | Qty: 100 | Fill #0

## 2017-12-07 MED FILL — FREESTYLE LANCETS: 25 days supply | Qty: 100 | Fill #0

## 2017-12-07 MED FILL — FREESTYLE LITE TEST STRIP: 25 days supply | Qty: 100 | Fill #0

## 2017-12-07 MED FILL — FREESTYLE FREEDOM LITE METE: W/DEVICE | 1 days supply | Qty: 1 | Fill #0

## 2017-12-10 DIAGNOSIS — G4733 Obstructive sleep apnea (adult) (pediatric): Secondary | ICD-10-CM | POA: Diagnosis not present

## 2017-12-31 ENCOUNTER — Ambulatory Visit: Payer: 59 | Admitting: Women's Health

## 2017-12-31 ENCOUNTER — Encounter: Payer: Self-pay | Admitting: Women's Health

## 2017-12-31 VITALS — BP 116/81 | HR 76 | Ht 68.0 in | Wt 304.0 lb

## 2017-12-31 DIAGNOSIS — Z3202 Encounter for pregnancy test, result negative: Secondary | ICD-10-CM | POA: Diagnosis not present

## 2017-12-31 DIAGNOSIS — N920 Excessive and frequent menstruation with regular cycle: Secondary | ICD-10-CM

## 2017-12-31 LAB — POCT WET PREP (WET MOUNT)
Clue Cells Wet Prep Whiff POC: NEGATIVE
Trichomonas Wet Prep HPF POC: ABSENT

## 2017-12-31 LAB — POCT URINE PREGNANCY: PREG TEST UR: NEGATIVE

## 2017-12-31 MED ORDER — MEGESTROL ACETATE 40 MG PO TABS: ORAL_TABLET | ORAL | 1 refills | Status: DC

## 2017-12-31 NOTE — Progress Notes (Signed)
   GYN VISIT Patient name: Cheryl Black MRN [id number]  Date of birth: 05/05/1985 Chief Complaint:   Menorrhagia (on period since 11-18, got heavy last night/ clots)  History of Present Illness:   Cheryl Black is a 32 y.o. G0P0000 Caucasian female being seen today for heavy prolonged bleeding. Has h/o PCOS, periods have been regular x last 3years, normally last 5d, heavy on 2nd day, no clots/cramps. This period started 11/8, has been intermittently heavy and has not stopped, last night at work became really heavy, actually pushed her tampon out. Changing saturated super tampon/pad q 1hr at heaviest times. Clots as large as tennis ball. Flow becomes heavier w/ orgasm, bm, etc. No cramping. Denies dizziness, lightheadedness. Bleeding has slowed some this am. Denies abnormal discharge, itching/odor/irritation.  No h/o fibroids. Not on contraception. Went through fertility treatments about 5 years ago w/o success.  Patient's last menstrual period was 11/23/2017. The current method of family planning is none. Last pap 03/03/16. Results were:  neg w/ -HRHPV Review of Systems:   Pertinent items are noted in HPI Denies fever/chills, dizziness, headaches, visual disturbances, fatigue, shortness of breath, chest pain, abdominal pain, vomiting, abnormal vaginal discharge/itching/odor/irritation, problems with periods, bowel movements, urination, or intercourse unless otherwise stated above.  Pertinent History Reviewed:  Reviewed past medical,surgical, social, obstetrical and family history.  Reviewed problem list, medications and allergies. Physical Assessment:   Vitals:   12/31/17 1115  BP: 116/81  Pulse: 76  Weight: (!) 304 lb (137.9 kg)  Height: 5' 8"  (1.727 m)  Body mass index is 46.22 kg/m.       Physical Examination:   General appearance: alert, well appearing, and in no distress  Mental status: alert, oriented to person, place, and time  Skin: warm & dry   Cardiovascular: normal heart  rate noted  Respiratory: normal respiratory effort, no distress  Abdomen: soft, non-tender   Pelvic: VULVA: normal appearing vulva with no masses, tenderness or lesions, VAGINA: normal appearing vagina with normal color and discharge, no lesions, CERVIX: normal appearing cervix without discharge or lesions, small clot at os, normal amt flow right now UTERUS: unable to adequately assess d/t habitus, nontender, ADNEXA: unable to adequately assess d/t habitus, slight tenderness Rt  Extremities: no edema   Results for orders placed or performed in visit on 12/31/17 (from the past 24 hour(s))  POCT urine pregnancy   Collection Time: 12/31/17 12:45 PM  Result Value Ref Range   Preg Test, Ur Negative Negative    Assessment & Plan:  1) Menorrhagia> rx megace algorithm, plan pelvic u/s, will check cbc, tsh, gc/ct  Meds:  Meds ordered this encounter  Medications  . megestrol (MEGACE) 40 MG tablet    Sig: 3x5d, 2x5d, then 1 daily x at least 5days    Dispense:  45 tablet    Refill:  1    Order Specific Question:   Supervising Provider    Answer:   Tania Ade H [[id number]]    Orders Placed This Encounter  Procedures  . GC/Chlamydia Probe Amp  . US PELVIS (TRANSABDOMINAL ONLY)  . US PELVIS TRANSVANGINAL NON-OB (TV ONLY)  . CBC  . TSH  . POCT urine pregnancy  . POCT [facility])    Return in about 2 weeks (around 01/14/2018) for US:GYN and f/u w/ me after.  [place], [facility] 12/31/2017 12:51 PM

## 2018-01-01 LAB — CBC
HEMATOCRIT: 43.9 % (ref 34.0–46.6)
Hemoglobin: 14.8 g/dL (ref 11.1–15.9)
MCH: 29.8 pg (ref 26.6–33.0)
MCHC: 33.7 g/dL (ref 31.5–35.7)
MCV: 89 fL (ref 79–97)
Platelets: 267 10*3/uL (ref 150–450)
RBC: 4.96 x10E6/uL (ref 3.77–5.28)
RDW: 12.7 % (ref 12.3–15.4)
WBC: 7.8 10*3/uL (ref 3.4–10.8)

## 2018-01-01 LAB — TSH: TSH: 2.2 u[IU]/mL (ref 0.450–4.500)

## 2018-01-02 LAB — GC/CHLAMYDIA PROBE AMP
Chlamydia trachomatis, NAA: NEGATIVE
Neisseria gonorrhoeae by PCR: NEGATIVE

## 2018-01-04 MED FILL — PANTOPRAZOLE SOD DR 40 MG T: 40 | 30 days supply | Qty: 60 | Fill #0

## 2018-01-04 MED FILL — KOMBIGLYZE XR 2.5-1,000 MG: 2.5-1000 | 30 days supply | Qty: 30 | Fill #5

## 2018-01-04 MED FILL — FREESTYLE LITE TEST STRIP: 25 days supply | Qty: 100 | Fill #1

## 2018-01-04 MED FILL — SOLIQUA 100 UNIT-33 MCG/ML: 100-33 | 25 days supply | Qty: 15 | Fill #0

## 2018-01-04 MED FILL — ALLOPURINOL 300 MG TABS: 300 | 30 days supply | Qty: 30 | Fill #2

## 2018-01-04 MED FILL — FREESTYLE LANCETS: 25 days supply | Qty: 100 | Fill #1

## 2018-01-04 MED FILL — HUMALOG 100 UNITS/ML KWIKPE: 100 | 30 days supply | Qty: 18 | Fill #1

## 2018-01-05 DIAGNOSIS — R5383 Other fatigue: Secondary | ICD-10-CM | POA: Diagnosis not present

## 2018-01-05 DIAGNOSIS — R509 Fever, unspecified: Secondary | ICD-10-CM | POA: Diagnosis not present

## 2018-01-10 DIAGNOSIS — G4733 Obstructive sleep apnea (adult) (pediatric): Secondary | ICD-10-CM | POA: Diagnosis not present

## 2018-01-18 ENCOUNTER — Encounter: Payer: Self-pay | Admitting: Adult Health

## 2018-01-18 ENCOUNTER — Ambulatory Visit: Payer: 59 | Admitting: Adult Health

## 2018-01-18 VITALS — BP 152/96 | HR 76 | Ht 69.0 in | Wt 300.0 lb

## 2018-01-18 DIAGNOSIS — R030 Elevated blood-pressure reading, without diagnosis of hypertension: Secondary | ICD-10-CM | POA: Diagnosis not present

## 2018-01-18 MED ORDER — HYDROCHLOROTHIAZIDE 12.5 MG PO CAPS: 12.5000 mg | ORAL_CAPSULE | Freq: Every day | ORAL | 3 refills | Status: DC

## 2018-01-18 NOTE — Patient Instructions (Signed)
DASH Eating Plan  DASH stands for "Dietary Approaches to Stop Hypertension." The DASH eating plan is a healthy eating plan that has been shown to reduce high blood pressure (hypertension). It may also reduce your risk for type 2 diabetes, heart disease, and stroke. The DASH eating plan may also help with weight loss.  What are tips for following this plan?    General guidelines   Avoid eating more than 2,300 mg (milligrams) of salt (sodium) a day. If you have hypertension, you may need to reduce your sodium intake to 1,500 mg a day.   Limit alcohol intake to no more than 1 drink a day for nonpregnant women and 2 drinks a day for men. One drink equals 12 oz of beer, 5 oz of wine, or 1 oz of hard liquor.   Work with your health care provider to maintain a healthy body weight or to lose weight. Ask what an ideal weight is for you.   Get at least 30 minutes of exercise that causes your heart to beat faster (aerobic exercise) most days of the week. Activities may include walking, swimming, or biking.   Work with your health care provider or diet and nutrition specialist (dietitian) to adjust your eating plan to your individual calorie needs.  Reading food labels     Check food labels for the amount of sodium per serving. Choose foods with less than 5 percent of the Daily Value of sodium. Generally, foods with less than 300 mg of sodium per serving fit into this eating plan.   To find whole grains, look for the word "whole" as the first word in the ingredient list.  Shopping   Buy products labeled as "low-sodium" or "no salt added."   Buy fresh foods. Avoid canned foods and premade or frozen meals.  Cooking   Avoid adding salt when cooking. Use salt-free seasonings or herbs instead of table salt or sea salt. Check with your health care provider or pharmacist before using salt substitutes.   Do not fry foods. Cook foods using healthy methods such as baking, boiling, grilling, and broiling instead.   Cook with  heart-healthy oils, such as olive, canola, soybean, or sunflower oil.  Meal planning   Eat a balanced diet that includes:  ? 5 or more servings of fruits and vegetables each day. At each meal, try to fill half of your plate with fruits and vegetables.  ? Up to 6-8 servings of whole grains each day.  ? Less than 6 oz of lean meat, poultry, or fish each day. A 3-oz serving of meat is about the same size as a deck of cards. One egg equals 1 oz.  ? 2 servings of low-fat dairy each day.  ? A serving of nuts, seeds, or beans 5 times each week.  ? Heart-healthy fats. Healthy fats called Omega-3 fatty acids are found in foods such as flaxseeds and coldwater fish, like sardines, salmon, and mackerel.   Limit how much you eat of the following:  ? Canned or prepackaged foods.  ? Food that is high in trans fat, such as fried foods.  ? Food that is high in saturated fat, such as fatty meat.  ? Sweets, desserts, sugary drinks, and other foods with added sugar.  ? Full-fat dairy products.   Do not salt foods before eating.   Try to eat at least 2 vegetarian meals each week.   Eat more home-cooked food and less restaurant, buffet, and fast food.     When eating at a restaurant, ask that your food be prepared with less salt or no salt, if possible.  What foods are recommended?  The items listed may not be a complete list. Talk with your dietitian about what dietary choices are best for you.  Grains  Whole-grain or whole-wheat bread. Whole-grain or whole-wheat pasta. Brown rice. Oatmeal. Quinoa. Bulgur. Whole-grain and low-sodium cereals. Pita bread. Low-fat, low-sodium crackers. Whole-wheat flour tortillas.  Vegetables  Fresh or frozen vegetables (raw, steamed, roasted, or grilled). Low-sodium or reduced-sodium tomato and vegetable juice. Low-sodium or reduced-sodium tomato sauce and tomato paste. Low-sodium or reduced-sodium canned vegetables.  Fruits  All fresh, dried, or frozen fruit. Canned fruit in natural juice (without  added sugar).  Meat and other protein foods  Skinless chicken or turkey. Ground chicken or turkey. Pork with fat trimmed off. Fish and seafood. Egg whites. Dried beans, peas, or lentils. Unsalted nuts, nut butters, and seeds. Unsalted canned beans. Lean cuts of beef with fat trimmed off. Low-sodium, lean deli meat.  Dairy  Low-fat (1%) or fat-free (skim) milk. Fat-free, low-fat, or reduced-fat cheeses. Nonfat, low-sodium ricotta or cottage cheese. Low-fat or nonfat yogurt. Low-fat, low-sodium cheese.  Fats and oils  Soft margarine without trans fats. Vegetable oil. Low-fat, reduced-fat, or light mayonnaise and salad dressings (reduced-sodium). Canola, safflower, olive, soybean, and sunflower oils. Avocado.  Seasoning and other foods  Herbs. Spices. Seasoning mixes without salt. Unsalted popcorn and pretzels. Fat-free sweets.  What foods are not recommended?  The items listed may not be a complete list. Talk with your dietitian about what dietary choices are best for you.  Grains  Baked goods made with fat, such as croissants, muffins, or some breads. Dry pasta or rice meal packs.  Vegetables  Creamed or fried vegetables. Vegetables in a cheese sauce. Regular canned vegetables (not low-sodium or reduced-sodium). Regular canned tomato sauce and paste (not low-sodium or reduced-sodium). Regular tomato and vegetable juice (not low-sodium or reduced-sodium). Pickles. Olives.  Fruits  Canned fruit in a light or heavy syrup. Fried fruit. Fruit in cream or butter sauce.  Meat and other protein foods  Fatty cuts of meat. Ribs. Fried meat. Bacon. Sausage. Bologna and other processed lunch meats. Salami. Fatback. Hotdogs. Bratwurst. Salted nuts and seeds. Canned beans with added salt. Canned or smoked fish. Whole eggs or egg yolks. Chicken or turkey with skin.  Dairy  Whole or 2% milk, cream, and half-and-half. Whole or full-fat cream cheese. Whole-fat or sweetened yogurt. Full-fat cheese. Nondairy creamers. Whipped toppings.  Processed cheese and cheese spreads.  Fats and oils  Butter. Stick margarine. Lard. Shortening. Ghee. Bacon fat. Tropical oils, such as coconut, palm kernel, or palm oil.  Seasoning and other foods  Salted popcorn and pretzels. Onion salt, garlic salt, seasoned salt, table salt, and sea salt. Worcestershire sauce. Tartar sauce. Barbecue sauce. Teriyaki sauce. Soy sauce, including reduced-sodium. Steak sauce. Canned and packaged gravies. Fish sauce. Oyster sauce. Cocktail sauce. Horseradish that you find on the shelf. Ketchup. Mustard. Meat flavorings and tenderizers. Bouillon cubes. Hot sauce and Tabasco sauce. Premade or packaged marinades. Premade or packaged taco seasonings. Relishes. Regular salad dressings.  Where to find more information:   National Heart, Lung, and Blood Institute: www.nhlbi.nih.gov   American Heart Association: www.heart.org  Summary   The DASH eating plan is a healthy eating plan that has been shown to reduce high blood pressure (hypertension). It may also reduce your risk for type 2 diabetes, heart disease, and stroke.   With the   DASH eating plan, you should limit salt (sodium) intake to 2,300 mg a day. If you have hypertension, you may need to reduce your sodium intake to 1,500 mg a day.   When on the DASH eating plan, aim to eat more fresh fruits and vegetables, whole grains, lean proteins, low-fat dairy, and heart-healthy fats.   Work with your health care provider or diet and nutrition specialist (dietitian) to adjust your eating plan to your individual calorie needs.  This information is not intended to replace advice given to you by your health care provider. Make sure you discuss any questions you have with your health care provider.  Document Released: 12/12/2010 Document Revised: 12/17/2015 Document Reviewed: 12/17/2015  Elsevier Interactive Patient Education  2019 Elsevier Inc.

## 2018-01-18 NOTE — Progress Notes (Signed)
Patient ID: Aris Lot, female   DOB: 1985/05/06, 33 y.o.   MRN: [id number] History of Present Illness: Ariyel is a 33 year old white female, married, works in ER at [employer].She was seen 12/31/2017 by Joellyn Haff CNM, for heavy bleeding and has history of PCO, she was prescribed megace and the bleeding stopped.TSH was 2.200 and HGB was 14.8.She did not have [name] yet. She last took Megace last week. She has seen GI for elevated liver enzymes but has not heard back in follow up. PCP is Dr Margo Aye.   Current Medications, Allergies, Past Medical History, Past Surgical History, Family History and Social History were reviewed in [employer] record.     Review of Systems: +headache for last few days and some blurring of vision Bleeding stopped.    Physical Exam:BP (!) 152/96 (BP Location: Right Arm, Cuff Size: Normal)   Pulse 76   Ht 5\' 9"  (1.753 m)   Wt 300 lb (136.1 kg)   LMP 11/24/2017 Comment: on going bleeding; Megace stopped bleeding   BMI 44.30 kg/m  General:  Well developed, well nourished, no acute distress Skin:  Warm and dry Lungs; Clear to auscultation bilaterally Cardiovascular: Regular rate and rhythm Psych:  No mood changes, alert and cooperative,seems happy Fall risk is low.   Impression: 1. Elevated BP without diagnosis of hypertension   She had menorrhagia but it stopped with Megace.     Plan: Get [name] 1/23 and see me after for BP check Keep BP log Review DASH diet Meds ordered this encounter  Medications  . hydrochlorothiazide (MICROZIDE) 12.5 MG capsule    Sig: Take 1 capsule (12.5 mg total) by mouth daily.    Dispense:  30 capsule    Refill:  3    Order Specific Question:   Supervising Provider    Answer:   Lazaro Arms [[id number]]  Call GI doctors for follow up

## 2018-01-20 DIAGNOSIS — E782 Mixed hyperlipidemia: Secondary | ICD-10-CM | POA: Diagnosis not present

## 2018-01-20 DIAGNOSIS — J45909 Unspecified asthma, uncomplicated: Secondary | ICD-10-CM | POA: Diagnosis not present

## 2018-01-20 DIAGNOSIS — E119 Type 2 diabetes mellitus without complications: Secondary | ICD-10-CM | POA: Diagnosis not present

## 2018-01-20 DIAGNOSIS — R112 Nausea with vomiting, unspecified: Secondary | ICD-10-CM | POA: Diagnosis not present

## 2018-01-20 DIAGNOSIS — F17200 Nicotine dependence, unspecified, uncomplicated: Secondary | ICD-10-CM | POA: Diagnosis not present

## 2018-01-20 DIAGNOSIS — E1165 Type 2 diabetes mellitus with hyperglycemia: Secondary | ICD-10-CM | POA: Diagnosis not present

## 2018-01-20 DIAGNOSIS — N390 Urinary tract infection, site not specified: Secondary | ICD-10-CM | POA: Diagnosis not present

## 2018-01-20 DIAGNOSIS — K219 Gastro-esophageal reflux disease without esophagitis: Secondary | ICD-10-CM | POA: Diagnosis not present

## 2018-01-20 DIAGNOSIS — I10 Essential (primary) hypertension: Secondary | ICD-10-CM | POA: Diagnosis not present

## 2018-01-20 DIAGNOSIS — G4452 New daily persistent headache (NDPH): Secondary | ICD-10-CM | POA: Diagnosis not present

## 2018-01-20 DIAGNOSIS — R945 Abnormal results of liver function studies: Secondary | ICD-10-CM | POA: Diagnosis not present

## 2018-01-21 MED FILL — BUTALB-ACETAMIN-CAFF 50-325: 50-325-40 | 30 days supply | Qty: 30 | Fill #0

## 2018-01-22 ENCOUNTER — Other Ambulatory Visit: Payer: Self-pay

## 2018-01-22 DIAGNOSIS — R945 Abnormal results of liver function studies: Principal | ICD-10-CM

## 2018-01-22 DIAGNOSIS — R7989 Other specified abnormal findings of blood chemistry: Secondary | ICD-10-CM

## 2018-01-25 MED FILL — SOLIQUA 100 UNIT-33 MCG/ML: 100-33 | 25 days supply | Qty: 15 | Fill #0

## 2018-01-28 ENCOUNTER — Ambulatory Visit (INDEPENDENT_AMBULATORY_CARE_PROVIDER_SITE_OTHER): Payer: 59 | Admitting: Adult Health

## 2018-01-28 ENCOUNTER — Encounter: Payer: Self-pay | Admitting: Adult Health

## 2018-01-28 ENCOUNTER — Ambulatory Visit (INDEPENDENT_AMBULATORY_CARE_PROVIDER_SITE_OTHER): Payer: 59

## 2018-01-28 VITALS — BP 112/81 | HR 78 | Ht 69.0 in | Wt 298.2 lb

## 2018-01-28 DIAGNOSIS — N920 Excessive and frequent menstruation with regular cycle: Secondary | ICD-10-CM

## 2018-01-28 DIAGNOSIS — R945 Abnormal results of liver function studies: Secondary | ICD-10-CM | POA: Diagnosis not present

## 2018-01-28 DIAGNOSIS — I10 Essential (primary) hypertension: Secondary | ICD-10-CM | POA: Diagnosis not present

## 2018-01-28 DIAGNOSIS — R9389 Abnormal findings on diagnostic imaging of other specified body structures: Secondary | ICD-10-CM | POA: Diagnosis not present

## 2018-01-28 DIAGNOSIS — N9489 Other specified conditions associated with female genital organs and menstrual cycle: Secondary | ICD-10-CM | POA: Diagnosis not present

## 2018-01-28 LAB — HEPATIC FUNCTION PANEL
AG Ratio: 1.4 (calc) (ref 1.0–2.5)
ALBUMIN MSPROF: 4 g/dL (ref 3.6–5.1)
ALT: 171 U/L — ABNORMAL HIGH (ref 6–29)
AST: 103 U/L — ABNORMAL HIGH (ref 10–30)
Alkaline phosphatase (APISO): 95 U/L (ref 33–115)
Bilirubin, Direct: 0.1 mg/dL (ref 0.0–0.2)
Globulin: 2.8 g/dL (calc) (ref 1.9–3.7)
Indirect Bilirubin: 0.3 mg/dL (calc) (ref 0.2–1.2)
Total Bilirubin: 0.4 mg/dL (ref 0.2–1.2)
Total Protein: 6.8 g/dL (ref 6.1–8.1)

## 2018-01-28 NOTE — Progress Notes (Signed)
Patient ID: Cheryl Black, female   DOB: 07-Oct-1985, 33 y.o.   MRN: [id number] History of Present Illness:  Cheryl Black is a 33 year old white female in for GYN [name] today for menorrhagia and for BP check, started microzide 01/18/2018, and BP at work has been better.She saw GI today and had LFTs rechecked.  PCP is Dr Margo Aye.   Current Medications, Allergies, Past Medical History, Past Surgical History, Family History and Social History were reviewed in [employer] record.     Review of Systems: No bleeding, not taking megace now BP better     Physical Exam:BP 112/81 (BP Location: Left Arm, Patient Position: Sitting, Cuff Size: Large)   Pulse 78   Ht 5\' 9"  (1.753 m)   Wt 298 lb 3.2 oz (135.3 kg)   LMP 11/24/2017   BMI 44.04 kg/m  General:  Well developed, well nourished, no acute distress Skin:  Warm and dry Lungs; Clear to auscultation bilaterally Cardiovascular: Regular rate and rhythm Psych:  No mood changes, alert and cooperative,seems happy BP is much better and has lost 2 lbs.  Reviewed [name] with Priyanka, has normal uterus and ovaries, but endometrium is thickened at 22.5 mm and has 1.9 x 1.2 x 1.6 cm hypoechoic endometrial mass with +color flow.Discussed with Dr Emelda Fear too, will get back to talk about endometrial biopsy verses D&C.   Impression: 1. Essential hypertension   2. Thickened endometrium   3. Endometrial mass       Plan: Continue microzide  Return 2/6 to talk with Dr Emelda Fear about endometrial biopsy vs D&C.

## 2018-01-28 NOTE — Progress Notes (Signed)
PELVIC US TA/TV: homogeneous anteverted uterus,wnl,1.9 x 1.2 x 1.6 cm hypoechoic endometrial mass w/color flow,thickened endometrium 22.5 mm,normal ovaries bilat,ovaries appear mobile,no free fluid,no pain during ultrasound

## 2018-02-01 DIAGNOSIS — G4733 Obstructive sleep apnea (adult) (pediatric): Secondary | ICD-10-CM | POA: Diagnosis not present

## 2018-02-02 ENCOUNTER — Telehealth: Payer: Self-pay | Admitting: *Deleted

## 2018-02-02 ENCOUNTER — Other Ambulatory Visit: Payer: Self-pay | Admitting: *Deleted

## 2018-02-02 DIAGNOSIS — R109 Unspecified abdominal pain: Secondary | ICD-10-CM

## 2018-02-02 DIAGNOSIS — R945 Abnormal results of liver function studies: Principal | ICD-10-CM

## 2018-02-02 DIAGNOSIS — R7989 Other specified abnormal findings of blood chemistry: Secondary | ICD-10-CM

## 2018-02-02 NOTE — Telephone Encounter (Signed)
PA initiated via [facility] website. Case pending. Case# [id number]

## 2018-02-02 NOTE — Telephone Encounter (Signed)
Received email from [facility] stating PA is not required.

## 2018-02-08 ENCOUNTER — Other Ambulatory Visit: Payer: Self-pay | Admitting: Adult Health Nurse Practitioner

## 2018-02-08 ENCOUNTER — Telehealth: Payer: Self-pay | Admitting: *Deleted

## 2018-02-08 MED FILL — ALLOPURINOL 300 MG TABS: 300 | 30 days supply | Qty: 30 | Fill #3 | Status: TO

## 2018-02-08 MED FILL — HUMALOG 100 UNITS/ML KWIKPE: 100 | 30 days supply | Qty: 18 | Fill #2 | Status: TO

## 2018-02-08 MED FILL — FREESTYLE LITE TEST STRIP: 25 days supply | Qty: 100 | Fill #2 | Status: TO

## 2018-02-08 NOTE — Telephone Encounter (Signed)
Spoke with pt. Pt started bleeding again heavy Thursday. Pt is scheduled for possible biopsy Thursday but she don't feel like it will be light enough by then. Pt states this is the worst period she's ever had. Please advise. Thanks!! JSY

## 2018-02-09 NOTE — Telephone Encounter (Signed)
Still bleeding heavy.Take 3 megace today and tomorrow and keep appt Thursday, if can't work will give note on Thursday

## 2018-02-10 ENCOUNTER — Ambulatory Visit (HOSPITAL_COMMUNITY): Payer: 59

## 2018-02-10 DIAGNOSIS — G4733 Obstructive sleep apnea (adult) (pediatric): Secondary | ICD-10-CM | POA: Diagnosis not present

## 2018-02-11 ENCOUNTER — Encounter: Payer: Self-pay | Admitting: *Deleted

## 2018-02-11 ENCOUNTER — Ambulatory Visit: Payer: 59 | Admitting: Obstetrics and Gynecology

## 2018-02-12 ENCOUNTER — Other Ambulatory Visit: Payer: Self-pay | Admitting: Gastroenterology

## 2018-02-12 ENCOUNTER — Ambulatory Visit (HOSPITAL_COMMUNITY)
Admission: RE | Admit: 2018-02-12 | Discharge: 2018-02-12 | Disposition: A | Discharge status: Home / Self Care | Payer: 59 | Source: Ambulatory Visit | Attending: Gastroenterology | Admitting: Gastroenterology

## 2018-02-12 DIAGNOSIS — R109 Unspecified abdominal pain: Secondary | ICD-10-CM

## 2018-02-12 DIAGNOSIS — R7989 Other specified abnormal findings of blood chemistry: Secondary | ICD-10-CM

## 2018-02-12 DIAGNOSIS — R945 Abnormal results of liver function studies: Secondary | ICD-10-CM | POA: Insufficient documentation

## 2018-02-12 DIAGNOSIS — R932 Abnormal findings on diagnostic imaging of liver and biliary tract: Secondary | ICD-10-CM | POA: Diagnosis not present

## 2018-02-12 LAB — POCT I-STAT CREATININE: Creatinine, Ser: 0.6 mg/dL (ref 0.44–1.00)

## 2018-02-12 MED ORDER — GADOBUTROL 1 MMOL/ML IV SOLN
10.0000 mL | Freq: Once | INTRAVENOUS | Status: AC | PRN
  Administered 2018-02-12: 10 mL via INTRAVENOUS

## 2018-02-17 ENCOUNTER — Encounter: Payer: Self-pay | Admitting: Obstetrics and Gynecology

## 2018-02-17 ENCOUNTER — Ambulatory Visit: Payer: 59 | Admitting: Obstetrics and Gynecology

## 2018-02-17 ENCOUNTER — Other Ambulatory Visit: Payer: Self-pay | Admitting: Obstetrics and Gynecology

## 2018-02-17 VITALS — BP 133/88 | HR 93 | Ht 69.0 in | Wt 297.4 lb

## 2018-02-17 DIAGNOSIS — R9389 Abnormal findings on diagnostic imaging of other specified body structures: Secondary | ICD-10-CM | POA: Diagnosis not present

## 2018-02-17 DIAGNOSIS — N858 Other specified noninflammatory disorders of uterus: Secondary | ICD-10-CM | POA: Diagnosis not present

## 2018-02-17 DIAGNOSIS — Z3202 Encounter for pregnancy test, result negative: Secondary | ICD-10-CM

## 2018-02-17 LAB — POCT URINE PREGNANCY: Preg Test, Ur: NEGATIVE

## 2018-02-17 MED ORDER — HYDROCHLOROTHIAZIDE 12.5 MG PO CAPS: 12.5000 mg | ORAL_CAPSULE | Freq: Every day | ORAL | 3 refills | Status: DC

## 2018-02-17 MED ORDER — MEDROXYPROGESTERONE ACETATE 10 MG PO TABS: 10.0000 mg | ORAL_TABLET | Freq: Every day | ORAL | 3 refills | Status: DC

## 2018-02-17 MED FILL — HYDROCHLOROTHIAZIDE 12.5 MG: 12.5 | 30 days supply | Qty: 30 | Fill #0 | Status: TO

## 2018-02-17 NOTE — Progress Notes (Addendum)
Patient ID: Cheryl Black, female   DOB: 08/18/1985, 33 y.o.   MRN: [id number]  S: Chart review reveals that Cheryl Black has a long history of PCOS, 5 years ago had a lengthy fertility work-up by Dr. Marzetta Board for iN fertility that was unsuccessful.  Recently she has been on Megace to control the bleeding she still has around the heavy periods ultrasound last month showed a thickened endometrium with questionable blood flow within the endometrial tissue raising the question of an endometrial polyp we have discussed a management plan.  Current plan is to do an endometrial biopsy today to assess the endometrial tissues, withdraw the patient with Provera 10 mg daily x14 days anticipating heavy menses.  Follow-up ultrasound will be performed.  If thickened endometrium persists or follow-up ultrasound shows a suspicion of a polyp would need hysteroscopic removal Started taking insulin in September after she had gone to the hospital for pneumonia and was given multiple steroids, which doctors believes shut down her pancreas and she has still not recovered, her A1c was 5.7 at that time  Endometrial Biopsy: Patient given informed consent, signed copy in the chart, time out was performed. Appropriate time out taken. . The patient was placed in the lithotomy position and the cervix brought into view with sterile speculum.  Portio of cervix cleansed x 2 with betadine swabs.  A tenaculum was placed in the anterior lip of the cervix. Patient had 2.4 cm thickened endometrium. The uterus was sounded for depth of 9 cm. A pipelle was introduced to into the uterus, suction created,  and an endometrial sample was obtained. All equipment was removed and accounted for.  The patient tolerated the procedure adequately with moderated pain.l.   A: 2.4 cm thickened endometrium versus polyp PCOS, with AUB  P: Follow-up endometrial biopsy results 1. Patient given post procedure instructions and will call in 1-2 weeks for  results. 2. Rx Provera 14 days.  In 3 to 4 weeks will repeat ultrasound   By signing my name below, I, Samul Dada, attest that this documentation has been prepared under the direction and in the presence of Jonnie Kind, MD. Electronically Signed: [place]. 02/17/18. 9:19 AM. I personally performed the services described in this documentation, which was SCRIBED in my presence. The recorded information has been reviewed and considered accurate. It has been edited as necessary during review. Jonnie Kind, MD   Jonnie Kind, MD

## 2018-02-23 NOTE — Progress Notes (Signed)
PATIENT SCHEDULED  °

## 2018-02-24 ENCOUNTER — Other Ambulatory Visit: Payer: Self-pay | Admitting: Adult Health Nurse Practitioner

## 2018-03-05 ENCOUNTER — Ambulatory Visit: Payer: 59 | Admitting: Internal Medicine

## 2018-03-11 DIAGNOSIS — G4733 Obstructive sleep apnea (adult) (pediatric): Secondary | ICD-10-CM | POA: Diagnosis not present

## 2018-03-15 DIAGNOSIS — J209 Acute bronchitis, unspecified: Secondary | ICD-10-CM | POA: Diagnosis not present

## 2018-03-17 ENCOUNTER — Other Ambulatory Visit: Payer: Self-pay | Admitting: Obstetrics and Gynecology

## 2018-03-17 DIAGNOSIS — N939 Abnormal uterine and vaginal bleeding, unspecified: Secondary | ICD-10-CM

## 2018-03-17 MED FILL — levoFLOXacin 750 MG TABS: 750 | 7 days supply | Qty: 7 | Fill #0

## 2018-03-18 ENCOUNTER — Encounter (HOSPITAL_COMMUNITY): Payer: Self-pay | Admitting: Emergency Medicine

## 2018-03-18 ENCOUNTER — Emergency Department (HOSPITAL_COMMUNITY): Payer: 59

## 2018-03-18 ENCOUNTER — Other Ambulatory Visit: Payer: Self-pay

## 2018-03-18 ENCOUNTER — Inpatient Hospital Stay (HOSPITAL_COMMUNITY)
Admission: EM | Admit: 2018-03-18 | Discharge: 2018-03-24 | DRG: 189 | Disposition: A | Discharge status: Home / Self Care | Payer: 59 | Attending: Internal Medicine | Admitting: Internal Medicine

## 2018-03-18 ENCOUNTER — Ambulatory Visit: Payer: 59 | Admitting: Obstetrics and Gynecology

## 2018-03-18 DIAGNOSIS — R001 Bradycardia, unspecified: Secondary | ICD-10-CM | POA: Diagnosis not present

## 2018-03-18 DIAGNOSIS — Z9049 Acquired absence of other specified parts of digestive tract: Secondary | ICD-10-CM | POA: Diagnosis not present

## 2018-03-18 DIAGNOSIS — R05 Cough: Secondary | ICD-10-CM | POA: Diagnosis not present

## 2018-03-18 DIAGNOSIS — Z79899 Other long term (current) drug therapy: Secondary | ICD-10-CM

## 2018-03-18 DIAGNOSIS — J9601 Acute respiratory failure with hypoxia: Secondary | ICD-10-CM | POA: Diagnosis not present

## 2018-03-18 DIAGNOSIS — Z888 Allergy status to other drugs, medicaments and biological substances status: Secondary | ICD-10-CM

## 2018-03-18 DIAGNOSIS — K219 Gastro-esophageal reflux disease without esophagitis: Secondary | ICD-10-CM | POA: Diagnosis present

## 2018-03-18 DIAGNOSIS — Z7951 Long term (current) use of inhaled steroids: Secondary | ICD-10-CM | POA: Diagnosis not present

## 2018-03-18 DIAGNOSIS — Z801 Family history of malignant neoplasm of trachea, bronchus and lung: Secondary | ICD-10-CM

## 2018-03-18 DIAGNOSIS — Z91012 Allergy to eggs: Secondary | ICD-10-CM

## 2018-03-18 DIAGNOSIS — J9801 Acute bronchospasm: Secondary | ICD-10-CM | POA: Diagnosis not present

## 2018-03-18 DIAGNOSIS — I10 Essential (primary) hypertension: Secondary | ICD-10-CM | POA: Diagnosis present

## 2018-03-18 DIAGNOSIS — J189 Pneumonia, unspecified organism: Secondary | ICD-10-CM | POA: Diagnosis not present

## 2018-03-18 DIAGNOSIS — K760 Fatty (change of) liver, not elsewhere classified: Secondary | ICD-10-CM | POA: Diagnosis present

## 2018-03-18 DIAGNOSIS — E282 Polycystic ovarian syndrome: Secondary | ICD-10-CM | POA: Diagnosis present

## 2018-03-18 DIAGNOSIS — R0603 Acute respiratory distress: Secondary | ICD-10-CM | POA: Diagnosis not present

## 2018-03-18 DIAGNOSIS — Z823 Family history of stroke: Secondary | ICD-10-CM

## 2018-03-18 DIAGNOSIS — Z791 Long term (current) use of non-steroidal anti-inflammatories (NSAID): Secondary | ICD-10-CM

## 2018-03-18 DIAGNOSIS — Z6841 Body Mass Index (BMI) 40.0 and over, adult: Secondary | ICD-10-CM

## 2018-03-18 DIAGNOSIS — Z9989 Dependence on other enabling machines and devices: Secondary | ICD-10-CM | POA: Diagnosis not present

## 2018-03-18 DIAGNOSIS — R0602 Shortness of breath: Secondary | ICD-10-CM | POA: Diagnosis not present

## 2018-03-18 DIAGNOSIS — Z87891 Personal history of nicotine dependence: Secondary | ICD-10-CM | POA: Diagnosis not present

## 2018-03-18 DIAGNOSIS — R509 Fever, unspecified: Secondary | ICD-10-CM | POA: Diagnosis not present

## 2018-03-18 DIAGNOSIS — G4733 Obstructive sleep apnea (adult) (pediatric): Secondary | ICD-10-CM | POA: Diagnosis not present

## 2018-03-18 DIAGNOSIS — Z720 Tobacco use: Secondary | ICD-10-CM | POA: Diagnosis not present

## 2018-03-18 DIAGNOSIS — E1165 Type 2 diabetes mellitus with hyperglycemia: Secondary | ICD-10-CM

## 2018-03-18 DIAGNOSIS — Z825 Family history of asthma and other chronic lower respiratory diseases: Secondary | ICD-10-CM

## 2018-03-18 DIAGNOSIS — E66813 Obesity, class 3: Secondary | ICD-10-CM | POA: Diagnosis present

## 2018-03-18 DIAGNOSIS — Z881 Allergy status to other antibiotic agents status: Secondary | ICD-10-CM

## 2018-03-18 DIAGNOSIS — E662 Morbid (severe) obesity with alveolar hypoventilation: Secondary | ICD-10-CM | POA: Diagnosis present

## 2018-03-18 DIAGNOSIS — R0989 Other specified symptoms and signs involving the circulatory and respiratory systems: Secondary | ICD-10-CM | POA: Diagnosis not present

## 2018-03-18 DIAGNOSIS — J9811 Atelectasis: Secondary | ICD-10-CM | POA: Diagnosis not present

## 2018-03-18 DIAGNOSIS — E785 Hyperlipidemia, unspecified: Secondary | ICD-10-CM | POA: Diagnosis present

## 2018-03-18 DIAGNOSIS — Z8249 Family history of ischemic heart disease and other diseases of the circulatory system: Secondary | ICD-10-CM

## 2018-03-18 DIAGNOSIS — Z7982 Long term (current) use of aspirin: Secondary | ICD-10-CM | POA: Diagnosis not present

## 2018-03-18 DIAGNOSIS — E1169 Type 2 diabetes mellitus with other specified complication: Secondary | ICD-10-CM

## 2018-03-18 DIAGNOSIS — J984 Other disorders of lung: Secondary | ICD-10-CM | POA: Diagnosis not present

## 2018-03-18 DIAGNOSIS — Z794 Long term (current) use of insulin: Secondary | ICD-10-CM

## 2018-03-18 DIAGNOSIS — R0789 Other chest pain: Secondary | ICD-10-CM | POA: Diagnosis not present

## 2018-03-18 DIAGNOSIS — B9789 Other viral agents as the cause of diseases classified elsewhere: Secondary | ICD-10-CM | POA: Diagnosis present

## 2018-03-18 DIAGNOSIS — Z87442 Personal history of urinary calculi: Secondary | ICD-10-CM | POA: Diagnosis not present

## 2018-03-18 DIAGNOSIS — Z79890 Hormone replacement therapy (postmenopausal): Secondary | ICD-10-CM

## 2018-03-18 DIAGNOSIS — J069 Acute upper respiratory infection, unspecified: Secondary | ICD-10-CM | POA: Diagnosis not present

## 2018-03-18 DIAGNOSIS — Z887 Allergy status to serum and vaccine status: Secondary | ICD-10-CM | POA: Diagnosis not present

## 2018-03-18 DIAGNOSIS — Z833 Family history of diabetes mellitus: Secondary | ICD-10-CM

## 2018-03-18 LAB — MRSA PCR SCREENING: MRSA by PCR: NEGATIVE

## 2018-03-18 LAB — CBC WITH DIFFERENTIAL/PLATELET
Abs Immature Granulocytes: 0.02 10*3/uL (ref 0.00–0.07)
Basophils Absolute: 0.1 10*3/uL (ref 0.0–0.1)
Basophils Relative: 1 %
Eosinophils Absolute: 0.3 10*3/uL (ref 0.0–0.5)
Eosinophils Relative: 4 %
HCT: 44.2 % (ref 36.0–46.0)
Hemoglobin: 14.4 g/dL (ref 12.0–15.0)
Immature Granulocytes: 0 %
Lymphocytes Relative: 23 %
Lymphs Abs: 1.9 10*3/uL (ref 0.7–4.0)
MCH: 28.9 pg (ref 26.0–34.0)
MCHC: 32.6 g/dL (ref 30.0–36.0)
MCV: 88.6 fL (ref 80.0–100.0)
Monocytes Absolute: 0.5 10*3/uL (ref 0.1–1.0)
Monocytes Relative: 6 %
Neutro Abs: 5.5 10*3/uL (ref 1.7–7.7)
Neutrophils Relative %: 66 %
Platelets: 238 10*3/uL (ref 150–400)
RBC: 4.99 MIL/uL (ref 3.87–5.11)
RDW: 13.2 % (ref 11.5–15.5)
WBC: 8.2 10*3/uL (ref 4.0–10.5)
nRBC: 0 % (ref 0.0–0.2)

## 2018-03-18 LAB — RESPIRATORY PANEL BY PCR
Adenovirus: NOT DETECTED
Bordetella pertussis: NOT DETECTED
Chlamydophila pneumoniae: NOT DETECTED
Coronavirus 229E: NOT DETECTED
Coronavirus HKU1: NOT DETECTED
Coronavirus NL63: NOT DETECTED
Coronavirus OC43: NOT DETECTED
Influenza A: NOT DETECTED
Influenza B: NOT DETECTED
Metapneumovirus: NOT DETECTED
Mycoplasma pneumoniae: NOT DETECTED
PARAINFLUENZA VIRUS 1-RVPPCR: NOT DETECTED
PARAINFLUENZA VIRUS 2-RVPPCR: NOT DETECTED
Parainfluenza Virus 3: NOT DETECTED
Parainfluenza Virus 4: NOT DETECTED
Respiratory Syncytial Virus: NOT DETECTED
Rhinovirus / Enterovirus: DETECTED — AB

## 2018-03-18 LAB — BASIC METABOLIC PANEL
Anion gap: 11 (ref 5–15)
BUN: 8 mg/dL (ref 6–20)
CO2: 18 mmol/L — ABNORMAL LOW (ref 22–32)
Calcium: 8.7 mg/dL — ABNORMAL LOW (ref 8.9–10.3)
Chloride: 107 mmol/L (ref 98–111)
Creatinine, Ser: 0.57 mg/dL (ref 0.44–1.00)
GFR calc Af Amer: >60 mL/min (ref >60)
GFR calc non Af Amer: >60 mL/min (ref >60)
Glucose, Bld: 255 mg/dL — ABNORMAL HIGH (ref 70–99)
Potassium: 3.6 mmol/L (ref 3.5–5.1)
Sodium: 136 mmol/L (ref 135–145)

## 2018-03-18 LAB — HEMOGLOBIN A1C
Hgb A1c MFr Bld: 8.9 % — ABNORMAL HIGH (ref 4.8–5.6)
Mean Plasma Glucose: 208.73 mg/dL

## 2018-03-18 LAB — PROCALCITONIN: Procalcitonin: <0.1 ng/mL

## 2018-03-18 LAB — GLUCOSE, CAPILLARY
GLUCOSE-CAPILLARY: 309 mg/dL — AB (ref 70–99)
Glucose-Capillary: 294 mg/dL — ABNORMAL HIGH (ref 70–99)

## 2018-03-18 LAB — CBG MONITORING, ED: Glucose-Capillary: 248 mg/dL — ABNORMAL HIGH (ref 70–99)

## 2018-03-18 LAB — BRAIN NATRIURETIC PEPTIDE: B Natriuretic Peptide: 38 pg/mL (ref 0.0–100.0)

## 2018-03-18 MED ORDER — ENOXAPARIN SODIUM 80 MG/0.8ML ~~LOC~~ SOLN
70.0000 mg | SUBCUTANEOUS | Status: DC
  Administered 2018-03-18 – 2018-03-23 (×6): 70 mg via SUBCUTANEOUS
  Filled 2018-03-18 (×6): qty 0.8

## 2018-03-18 MED ORDER — INSULIN ASPART 100 UNIT/ML ~~LOC~~ SOLN
0.0000 [IU] | Freq: Every day | SUBCUTANEOUS | Status: DC
  Administered 2018-03-18 – 2018-03-21 (×4): 4 [IU] via SUBCUTANEOUS
  Administered 2018-03-22: 5 [IU] via SUBCUTANEOUS
  Administered 2018-03-23: 3 [IU] via SUBCUTANEOUS

## 2018-03-18 MED ORDER — MAGNESIUM SULFATE 2 GM/50ML IV SOLN
2.0000 g | Freq: Once | INTRAVENOUS | Status: AC
  Administered 2018-03-18: 2 g via INTRAVENOUS
  Filled 2018-03-18: qty 50

## 2018-03-18 MED ORDER — TERBUTALINE SULFATE 1 MG/ML IJ SOLN
0.2500 mg | Freq: Once | INTRAMUSCULAR | Status: AC
  Administered 2018-03-18: 0.25 mg via SUBCUTANEOUS
  Filled 2018-03-18: qty 1

## 2018-03-18 MED ORDER — MEDROXYPROGESTERONE ACETATE 10 MG PO TABS: 10.0000 mg | ORAL_TABLET | Freq: Every day | ORAL | Status: DC

## 2018-03-18 MED ORDER — INSULIN GLARGINE 100 UNIT/ML ~~LOC~~ SOLN
60.0000 [IU] | Freq: Every day | SUBCUTANEOUS | Status: DC
  Administered 2018-03-18 – 2018-03-21 (×4): 60 [IU] via SUBCUTANEOUS
  Filled 2018-03-18 (×5): qty 0.6

## 2018-03-18 MED ORDER — LORATADINE 10 MG PO TABS
10.0000 mg | ORAL_TABLET | Freq: Every day | ORAL | Status: DC
  Administered 2018-03-18 – 2018-03-24 (×7): 10 mg via ORAL
  Filled 2018-03-18 (×7): qty 1

## 2018-03-18 MED ORDER — IPRATROPIUM-ALBUTEROL 0.5-2.5 (3) MG/3ML IN SOLN
3.0000 mL | Freq: Once | RESPIRATORY_TRACT | Status: AC
  Administered 2018-03-18: 3 mL via RESPIRATORY_TRACT

## 2018-03-18 MED ORDER — ACETAMINOPHEN 325 MG PO TABS: 650.0000 mg | ORAL_TABLET | Freq: Four times a day (QID) | ORAL | Status: DC | PRN

## 2018-03-18 MED ORDER — PANTOPRAZOLE SODIUM 40 MG PO TBEC
40.0000 mg | DELAYED_RELEASE_TABLET | Freq: Every day | ORAL | Status: DC
  Administered 2018-03-18 – 2018-03-24 (×7): 40 mg via ORAL
  Filled 2018-03-18 (×7): qty 1

## 2018-03-18 MED ORDER — ACETAMINOPHEN 650 MG RE SUPP: 650.0000 mg | Freq: Four times a day (QID) | RECTAL | Status: DC | PRN

## 2018-03-18 MED ORDER — LEVALBUTEROL HCL 0.63 MG/3ML IN NEBU
0.6300 mg | INHALATION_SOLUTION | RESPIRATORY_TRACT | Status: DC | PRN
  Administered 2018-03-20 – 2018-03-21 (×4): 0.63 mg via RESPIRATORY_TRACT
  Filled 2018-03-18 (×5): qty 3

## 2018-03-18 MED ORDER — METHYLPREDNISOLONE SODIUM SUCC 125 MG IJ SOLR
80.0000 mg | Freq: Once | INTRAMUSCULAR | Status: AC
  Administered 2018-03-18: 80 mg via INTRAVENOUS
  Filled 2018-03-18: qty 2

## 2018-03-18 MED ORDER — LEVALBUTEROL HCL 0.63 MG/3ML IN NEBU
0.6300 mg | INHALATION_SOLUTION | Freq: Four times a day (QID) | RESPIRATORY_TRACT | Status: DC
  Administered 2018-03-18 – 2018-03-24 (×25): 0.63 mg via RESPIRATORY_TRACT
  Filled 2018-03-18 (×24): qty 3

## 2018-03-18 MED ORDER — IPRATROPIUM-ALBUTEROL 0.5-2.5 (3) MG/3ML IN SOLN
RESPIRATORY_TRACT | Status: AC
  Administered 2018-03-18: 3 mL via RESPIRATORY_TRACT
  Filled 2018-03-18: qty 3

## 2018-03-18 MED ORDER — ORAL CARE MOUTH RINSE
15.0000 mL | Freq: Two times a day (BID) | OROMUCOSAL | Status: DC
  Administered 2018-03-18 – 2018-03-24 (×6): 15 mL via OROMUCOSAL

## 2018-03-18 MED ORDER — INSULIN ASPART 100 UNIT/ML ~~LOC~~ SOLN
0.0000 [IU] | Freq: Three times a day (TID) | SUBCUTANEOUS | Status: DC
  Administered 2018-03-18: 11 [IU] via SUBCUTANEOUS
  Administered 2018-03-19: 15 [IU] via SUBCUTANEOUS
  Administered 2018-03-19: 7 [IU] via SUBCUTANEOUS
  Administered 2018-03-19 – 2018-03-20 (×2): 15 [IU] via SUBCUTANEOUS
  Administered 2018-03-20: 7 [IU] via SUBCUTANEOUS
  Administered 2018-03-20: 11 [IU] via SUBCUTANEOUS
  Administered 2018-03-21 (×2): 15 [IU] via SUBCUTANEOUS
  Administered 2018-03-21: 7 [IU] via SUBCUTANEOUS
  Administered 2018-03-22: 15 [IU] via SUBCUTANEOUS
  Administered 2018-03-22: 20 [IU] via SUBCUTANEOUS
  Administered 2018-03-22: 7 [IU] via SUBCUTANEOUS
  Administered 2018-03-23: 15 [IU] via SUBCUTANEOUS
  Administered 2018-03-23: 7 [IU] via SUBCUTANEOUS
  Administered 2018-03-23: 11 [IU] via SUBCUTANEOUS
  Administered 2018-03-24 (×2): 4 [IU] via SUBCUTANEOUS

## 2018-03-18 MED ORDER — SODIUM CHLORIDE 0.9 % IV SOLN
INTRAVENOUS | Status: DC | PRN
  Administered 2018-03-18 – 2018-03-22 (×2): via INTRAVENOUS

## 2018-03-18 MED ORDER — BUDESONIDE 0.5 MG/2ML IN SUSP
0.5000 mg | Freq: Two times a day (BID) | RESPIRATORY_TRACT | Status: DC
  Administered 2018-03-18 – 2018-03-24 (×12): 0.5 mg via RESPIRATORY_TRACT
  Filled 2018-03-18 (×12): qty 2

## 2018-03-18 MED ORDER — ENOXAPARIN SODIUM 40 MG/0.4ML ~~LOC~~ SOLN: 40.0000 mg | SUBCUTANEOUS | Status: DC

## 2018-03-18 MED ORDER — IPRATROPIUM BROMIDE 0.02 % IN SOLN
1.0000 mg | Freq: Once | RESPIRATORY_TRACT | Status: AC
  Administered 2018-03-18: 1 mg via RESPIRATORY_TRACT
  Filled 2018-03-18: qty 5

## 2018-03-18 MED ORDER — CALCIUM CARB-CHOLECALCIFEROL 600-1000 MG-UNIT PO CAPS: 2.0000 | ORAL_CAPSULE | Freq: Every evening | ORAL | Status: DC

## 2018-03-18 MED ORDER — ALBUTEROL SULFATE (2.5 MG/3ML) 0.083% IN NEBU
2.5000 mg | INHALATION_SOLUTION | Freq: Once | RESPIRATORY_TRACT | Status: AC
  Administered 2018-03-18: 2.5 mg via RESPIRATORY_TRACT

## 2018-03-18 MED ORDER — CHLORHEXIDINE GLUCONATE 0.12 % MT SOLN
15.0000 mL | Freq: Two times a day (BID) | OROMUCOSAL | Status: DC
  Administered 2018-03-18 – 2018-03-24 (×12): 15 mL via OROMUCOSAL
  Filled 2018-03-18 (×12): qty 15

## 2018-03-18 MED ORDER — VITAMIN D 25 MCG (1000 UNIT) PO TABS
1000.0000 [IU] | ORAL_TABLET | Freq: Two times a day (BID) | ORAL | Status: DC
  Administered 2018-03-18 – 2018-03-24 (×11): 1000 [IU] via ORAL
  Filled 2018-03-18 (×11): qty 1

## 2018-03-18 MED ORDER — ALBUTEROL (5 MG/ML) CONTINUOUS INHALATION SOLN
15.0000 mg/h | INHALATION_SOLUTION | RESPIRATORY_TRACT | Status: DC
  Administered 2018-03-18: 15 mg/h via RESPIRATORY_TRACT
  Filled 2018-03-18: qty 20

## 2018-03-18 MED ORDER — ONDANSETRON HCL 4 MG PO TABS: 4.0000 mg | ORAL_TABLET | Freq: Four times a day (QID) | ORAL | Status: DC | PRN

## 2018-03-18 MED ORDER — LEVOFLOXACIN IN D5W 500 MG/100ML IV SOLN
500.0000 mg | Freq: Once | INTRAVENOUS | Status: AC
  Administered 2018-03-18: 500 mg via INTRAVENOUS
  Filled 2018-03-18: qty 100

## 2018-03-18 MED ORDER — ALLOPURINOL 300 MG PO TABS
300.0000 mg | ORAL_TABLET | Freq: Every evening | ORAL | Status: DC
  Administered 2018-03-18 – 2018-03-23 (×5): 300 mg via ORAL
  Filled 2018-03-18 (×5): qty 1

## 2018-03-18 MED ORDER — LORAZEPAM 2 MG/ML IJ SOLN
1.0000 mg | Freq: Once | INTRAMUSCULAR | Status: AC
  Administered 2018-03-18: 1 mg via INTRAVENOUS
  Filled 2018-03-18: qty 1

## 2018-03-18 MED ORDER — ONDANSETRON HCL 4 MG/2ML IJ SOLN: 4.0000 mg | Freq: Four times a day (QID) | INTRAMUSCULAR | Status: DC | PRN

## 2018-03-18 MED ORDER — IPRATROPIUM BROMIDE 0.02 % IN SOLN
0.5000 mg | Freq: Four times a day (QID) | RESPIRATORY_TRACT | Status: DC
  Administered 2018-03-18 – 2018-03-24 (×24): 0.5 mg via RESPIRATORY_TRACT
  Filled 2018-03-18 (×24): qty 2.5

## 2018-03-18 MED ORDER — LEVOFLOXACIN 500 MG PO TABS
500.0000 mg | ORAL_TABLET | Freq: Every day | ORAL | Status: DC
  Administered 2018-03-19 – 2018-03-21 (×3): 500 mg via ORAL
  Filled 2018-03-18 (×3): qty 1

## 2018-03-18 MED ORDER — FLUTICASONE PROPIONATE 50 MCG/ACT NA SUSP
2.0000 | Freq: Every evening | NASAL | Status: DC
  Administered 2018-03-18 – 2018-03-23 (×5): 2 via NASAL
  Filled 2018-03-18: qty 16

## 2018-03-18 MED ORDER — IBUPROFEN 800 MG PO TABS
800.0000 mg | ORAL_TABLET | Freq: Four times a day (QID) | ORAL | Status: DC | PRN
  Administered 2018-03-18: 800 mg via ORAL
  Filled 2018-03-18: qty 1

## 2018-03-18 MED ORDER — ALBUTEROL SULFATE (2.5 MG/3ML) 0.083% IN NEBU
INHALATION_SOLUTION | RESPIRATORY_TRACT | Status: AC
  Administered 2018-03-18: 2.5 mg via RESPIRATORY_TRACT
  Filled 2018-03-18: qty 3

## 2018-03-18 MED ORDER — METHYLPREDNISOLONE SODIUM SUCC 125 MG IJ SOLR
60.0000 mg | Freq: Four times a day (QID) | INTRAMUSCULAR | Status: DC
  Administered 2018-03-18 – 2018-03-23 (×20): 60 mg via INTRAVENOUS
  Filled 2018-03-18 (×21): qty 2

## 2018-03-18 MED ORDER — DM-GUAIFENESIN ER 30-600 MG PO TB12
1.0000 | ORAL_TABLET | Freq: Two times a day (BID) | ORAL | Status: DC
  Administered 2018-03-18 – 2018-03-21 (×6): 1 via ORAL
  Filled 2018-03-18 (×7): qty 1

## 2018-03-18 MED ORDER — CALCIUM CARBONATE 1250 (500 CA) MG PO TABS
1.0000 | ORAL_TABLET | Freq: Two times a day (BID) | ORAL | Status: DC
  Administered 2018-03-18 – 2018-03-24 (×11): 500 mg via ORAL
  Filled 2018-03-18 (×11): qty 1

## 2018-03-18 NOTE — ED Provider Notes (Signed)
[facility] EMERGENCY DEPARTMENT Provider Note   CSN: [id number] Arrival date & time: 03/18/18  1057    History   Chief Complaint Chief Complaint  Patient presents with   Shortness of Breath    HPI Cheryl Black is a 33 y.o. female.     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  She began feeling poorly on Saturday.  Coughing, shortness of breath and fever.  This past weekend she was started on Augmentin and doxycycline.  She subsequently developed a rash and those antibiotics were stopped and she started taking Levaquin yesterday.  There was hesitancy to start on steroids secondary to her glucose being higher than typical with this illness.  She has been using her breathing treatments regularly.  Despite this, her symptoms continue to progress.  She feels tight in her chest.  No unusual leg pain or swelling.  Past Medical History:  Diagnosis Date   Acid reflux    Asthma    Clostridium difficile infection    in the past   Diabetes mellitus    Fatty liver    Fibrocystic breast disease    Gout    Hidradenitis    History of kidney stones    Hyperlipemia    Obesity    Polycystic ovary syndrome    PONV (postoperative nausea and vomiting)    Smoker     Patient Active Problem List   Diagnosis Date Noted   Thickened endometrium 01/28/2018   Endometrial mass 01/28/2018   Abdominal pain 10/09/2017   Nausea & vomiting 09/24/2017   DOE (dyspnea on exertion) 09/16/2017   SOB (shortness of breath) 08/25/2017   Bronchospasm, acute 08/25/2017   Acute respiratory failure ([place]) 08/25/2017   C. difficile enteritis 02/01/2012   Hyponatremia 01/07/2011   Hypokalemia 01/07/2011   Elevated LFTs 01/07/2011   Hypomagnesemia 01/07/2011   Diabetes mellitus type 2 in obese ([place]) 01/06/2011   Morbid obesity ([place]) 01/06/2011   Polycystic ovary disease 01/06/2011   Tobacco abuse 01/06/2011   FATTY LIVER DISEASE  06/08/2008   RUQ pain 06/08/2008   TRANSAMINASES, SERUM, ELEVATED 06/08/2008    Past Surgical History:  Procedure Laterality Date   CHOLECYSTECTOMY  01/10/2011   Procedure: LAPAROSCOPIC CHOLECYSTECTOMY;  Surgeon: Jamesetta So;  Location: [facility];  Service: General;  Laterality: N/A;   HYDRADENITIS EXCISION Right 06/18/2012   Procedure: EXCISION HYDRIADENITIS SUPRATIVA  RIGHT AXILLA;  Surgeon: Scherry Ran, MD;  Location: [facility];  Service: General;  Laterality: Right;   LIVER BIOPSY  01/10/2011   Procedure: LIVER BIOPSY;  Surgeon: Jamesetta So;  Location: [facility];  Service: General;;   SKIN SPLIT GRAFT Right 06/18/2012   Procedure: SKIN GRAFT SPLIT THICKNESS RIGHT AXILLA(WITH TWO STANDARD SKIN BOARDS 3"x 8" )  DONOR SITE RIGHT AND LEFT THIGHS;  Surgeon: Scherry Ran, MD;  Location: [facility];  Service: General;  Laterality: Right;     OB History    Gravida  0   Para  0   Term  0   Preterm  0   AB  0   Living  0     SAB  0   TAB  0   Ectopic  0   Multiple  0   Live Births  0            Home Medications    Prior to Admission medications   Medication Sig Start Date End Date Taking? Authorizing  Provider  albuterol (PROVENTIL HFA;VENTOLIN HFA) 108 (90 Base) MCG/ACT inhaler Inhale 2 puffs into the lungs every 6 (six) hours as needed for wheezing or shortness of breath. 08/31/17   Isaac Bliss, Rayford Halsted, MD  albuterol (PROVENTIL) (5 MG/ML) 0.5% nebulizer solution Take 0.5 mLs (2.5 mg total) by nebulization every 6 (six) hours as needed for wheezing or shortness of breath. 08/23/17   Jacqlyn Larsen, PA-C  allopurinol (ZYLOPRIM) 300 MG tablet Take 300 mg by mouth every evening.     [provider]  Aspirin-Acetaminophen-Caffeine (EXCEDRIN MIGRAINE PO) Take by mouth as directed.    [provider]  Calcium Carb-Cholecalciferol [phone] MG-UNIT CAPS Take 2 capsules by mouth every evening.    [provider]  calcium carbonate (TUMS  CHEWY BITES) 750 MG chewable tablet Chew 1 tablet by mouth as needed for heartburn.    [provider]  colchicine 0.6 MG tablet Take 0.6 mg by mouth daily as needed.    [provider]  fexofenadine (ALLEGRA) 180 MG tablet Take 180 mg by mouth every evening.    [provider]  fluticasone (FLONASE) 50 MCG/ACT nasal spray Place 2 sprays into both nostrils every evening.    [provider]  hydrochlorothiazide (MICROZIDE) 12.5 MG capsule Take 1 capsule (12.5 mg total) by mouth daily. 02/17/18   Jonnie Kind, MD  ibuprofen (ADVIL,MOTRIN) 200 MG tablet Take 800 mg by mouth every 6 (six) hours as needed for mild pain or moderate pain.    [provider]  Insulin Glargine-Lixisenatide (SOLIQUA) 100-33 UNT-MCG/ML SOPN Inject 56 Units into the skin at bedtime.     [provider]  insulin lispro (HUMALOG) 100 UNIT/ML injection Sliding scale 4 times a day    [provider]  KOMBIGLYZE XR 2.05-998 MG TB24 Take 1 tablet by mouth 2 (two) times daily.  02/13/16   [provider]  medroxyPROGESTERone (PROVERA) 10 MG tablet Take 1 tablet (10 mg total) by mouth daily. One tablet daily x 2 weeks. Repeat as directed 02/17/18   Jonnie Kind, MD  pantoprazole (PROTONIX) 40 MG tablet Take 1 tablet (40 mg total) by mouth daily. 09/01/17   Isaac Bliss, Rayford Halsted, MD    Family History Family History  Problem Relation Age of Onset   Stroke Other        paternal great grandfather   Diabetes Father    Hypertension Father    Cancer Maternal Grandfather        adenocarcinoma   Emphysema Maternal Grandfather        smoked   Heart attack Paternal Grandfather    Hypertension Paternal Grandmother    Diabetes Paternal Grandmother    Gout Paternal Grandmother    Hypertension Maternal Grandmother    Other Mother        pre-cancerous cells; had hyst   Emphysema Mother        smoker   Gout Brother    Cancer Sister         cervical; had hyst   Asthma Maternal Uncle    Colon cancer Neg Hx    Liver disease Neg Hx    Liver cancer Neg Hx     Social History Social History   Tobacco Use   Smoking status: Former Smoker    Packs/day: 1.00    Years: 10.00    Pack years: 10.00    Types: Cigarettes    Last attempt to quit: 08/23/2017    Years since quitting:  0.5   Smokeless tobacco: Never Used  Substance Use Topics   Alcohol use: Yes    Comment: occas   Drug use: No     Allergies   Ace inhibitors; Beta adrenergic blockers; Augmentin [amoxicillin-pot clavulanate]; Benicar [olmesartan]; Doxycycline; Eggs or egg-derived products; Influenza vaccine live; and Invokana [canagliflozin]   Review of Systems Review of Systems  All systems reviewed and negative, other than as noted in HPI.  Physical Exam Updated Vital Signs BP (!) 129/97 (BP Location: Right Arm)    Pulse 93    Temp 98.5 F (36.9 C) (Oral)    Resp (!) 28    Ht 5' 8"  (1.727 m)    Wt 136.1 kg    LMP 02/04/2018    SpO2 97%    BMI 45.61 kg/m   Physical Exam Vitals signs and nursing note reviewed.  Constitutional:      General: She is in acute distress.     Appearance: She is well-developed.  HENT:     Head: Normocephalic and atraumatic.  Eyes:     General:        Right eye: No discharge.        Left eye: No discharge.     Conjunctiva/sclera: Conjunctivae normal.  Neck:     Musculoskeletal: Neck supple.  Cardiovascular:     Rate and Rhythm: Regular rhythm. Tachycardia present.     Heart sounds: Normal heart sounds. No murmur. No friction rub. No gallop.   Pulmonary:     Effort: Respiratory distress present.     Breath sounds: Wheezing present.  Abdominal:     General: There is no distension.     Palpations: Abdomen is soft.     Tenderness: There is no abdominal tenderness.  Musculoskeletal:        General: No tenderness.     Comments: Lower extremities symmetric as compared to each other. No calf tenderness. Negative  Homan's. No palpable cords.   Skin:    General: Skin is warm and dry.  Neurological:     Mental Status: She is alert.  Psychiatric:        Behavior: Behavior normal.        Thought Content: Thought content normal.    ED Treatments / Results  Labs (all labs ordered are listed, but only abnormal results are displayed) Labs Reviewed  BASIC METABOLIC PANEL - Abnormal; Notable for the following components:      Result Value   CO2 18 (*)    Glucose, Bld 255 (*)    Calcium 8.7 (*)    All other components within normal limits  CBG MONITORING, ED - Abnormal; Notable for the following components:   Glucose-Capillary 248 (*)    All other components within normal limits  CULTURE, BLOOD (ROUTINE X 2)  CULTURE, BLOOD (ROUTINE X 2)  CBC WITH DIFFERENTIAL/PLATELET  BRAIN NATRIURETIC PEPTIDE    EKG EKG Interpretation  Date/Time:  Thursday March 18 2018 11:39:27 EDT Ventricular Rate:  98 PR Interval:    QRS Duration: 82 QT Interval:  352 QTC Calculation: 450 R Axis:   47 Text Interpretation:  Sinus rhythm Black voltage, precordial leads Confirmed by Virgel Manifold [phone]) on 03/18/2018 12:34:08 PM   Radiology No results found.  Procedures Procedures (including critical care time)  CRITICAL CARE Performed by: Virgel Manifold Total critical care time: 35 minutes Critical care time was exclusive of separately billable procedures and treating other patients. Critical care was necessary to treat or prevent imminent or life-threatening  deterioration. Critical care was time spent personally by me on the following activities: development of treatment plan with patient and/or surrogate as well as nursing, discussions with consultants, evaluation of patient's response to treatment, examination of patient, obtaining history from patient or surrogate, ordering and performing treatments and interventions, ordering and review of laboratory studies, ordering and review of radiographic studies, pulse  oximetry and re-evaluation of patient's condition.   Medications Ordered in ED Medications  albuterol (PROVENTIL,VENTOLIN) solution continuous neb (15 mg/hr Nebulization New Bag/Given 03/18/18 1151)  magnesium sulfate IVPB 2 g 50 mL (2 g Intravenous New Bag/Given 03/18/18 1210)  albuterol (PROVENTIL) (2.5 MG/3ML) 0.083% nebulizer solution 2.5 mg (2.5 mg Nebulization Given 03/18/18 1124)  ipratropium-albuterol (DUONEB) 0.5-2.5 (3) MG/3ML nebulizer solution 3 mL (3 mLs Nebulization Given 03/18/18 1125)  ipratropium (ATROVENT) nebulizer solution 1 mg (1 mg Nebulization Given 03/18/18 1151)  methylPREDNISolone sodium succinate (SOLU-MEDROL) 125 mg/2 mL injection 80 mg (80 mg Intravenous Given 03/18/18 1157)  LORazepam (ATIVAN) injection 1 mg (1 mg Intravenous Given 03/18/18 1207)  terbutaline (BRETHINE) injection 0.25 mg (0.25 mg Subcutaneous Given 03/18/18 1207)     Initial Impression / Assessment and Plan / ED Course  I have reviewed the triage vital signs and the nursing notes.  Pertinent labs & imaging results that were available during my care of the patient were reviewed by me and considered in my medical decision making (see chart for details).  33 year old female with respiratory distress.  She had to be started on BiPAP and thankfully she was responding to it well.  Continuous nebs.  She is hyperglycemic, but the benefit she will from steroids outweighs further hyperglycemia.  This can be managed with insulin while admitted.  She was also given magnesium and terbutaline.  She was started on Levaquin yesterday and did not take a dose yet today.  A dose was ordered.  She reports that she was influenza negative as tested by her PCP.  No recent significant travel history or known contacts with [phone].   Final Clinical Impressions(s) / ED Diagnoses   Final diagnoses:  Respiratory distress  Bronchospasm    ED Discharge Orders    None       Virgel Manifold, MD 03/18/18 1315

## 2018-03-18 NOTE — H&P (Signed)
History and Physical  Cheryl Black SAY:[id number] DOB: 11-02-1985 DOA: 03/18/2018   PCP: [name], MD   Patient coming from: Home  Chief Complaint: dyspnea, cough  HPI:  Cheryl Black is a 33 y.o. female with medical history of diabetes mellitus type 2, PCOS, hyperlipidemia, obesity, tobacco abuse presenting with cough, congestion, and generalized weakness that began on 03/14/2018.  The patient had been running fevers up to 101.0 F at home.  She went to see her primary care provider on 03/15/2018 and was started on Augmentin and doxycycline.  The patient had taken 2 doses of Augmentin.  However, when she took the doxycycline, she developed a rash on her hands.  She stated that her breathing and coughing and chest congestion did not improve.  She went back to see her primary care provider on 03/18/2018.  The patient was given a prescription for levofloxacin, but her breathing was worsening.  As result, the patient was sent to emergency department for further evaluation.  The patient denies any recent travels outside of [place].  However, she works as an Therapist, sports in the emergency department.  Her last shift at work was on 03/10/2018.  The patient lives at home with her spouse who was recently discharged from the hospital on 03/12/2018 after a stay for treatment for COPD exacerbation.  He also smokes, but he has not traveled outside [place] in the past month.  The patient has not had any sick close contacts.  Patient has had some chest tightness with her coughing but she denies any nausea, vomiting, diarrhea, abdominal pain, headache, sore throat.  There is no abdominal pain, dysuria, hematuria.  She has been using her nebulizer machine at home without much improvement. In the emergency department, the patient developed some respiratory distress.  She was placed on BiPAP.  She was given bronchodilators and the Brethine.  She was started on Solu-Medrol and levofloxacin.  The patient was admitted  for further evaluation.  Assessment/Plan: Acute Respiratory Failure with Hypoxia -due to bronchospasm, likely due to underlying viral infection -pt does not have diagnosis of asthma or COPD -Patient had seen pulmonology, Dr. Melvyn Novas on 09/24/2016--PFTs did not suggest COPD or asthma -?reactive airway disease -stable on BiPAP  Acute Bronchospasm/reactive airway disease -Chest x-ray does not show consolidation -Viral respiratory panel -Influenza rapid antigen test was negative in the office -Obtain influenza PCR -Start Pulmicort -Start Xopenex -Start Solu-Medrol -Continue loratadine  Diabetes mellitus type 2, uncontrolled with hyperglycemia -Hemoglobin A1c -Start Lantus 60 units daily -Resistant NovoLog sliding scale -Holding Kombiglyze  Morbid obesity -BMI 45.61 -Lifestyle modification  PCOS -Patient follows with Dr. [name] -Continue Provera  Tobacco Abuse -Total time spent 35 minutes.  Greater than 50% spent face to face counseling and coordinating care.      Past Medical History:  Diagnosis Date  . Acid reflux   . Asthma   . Clostridium difficile infection    in the past  . Diabetes mellitus   . Fatty liver   . Fibrocystic breast disease   . Gout   . Hidradenitis   . History of kidney stones   . Hyperlipemia   . Obesity   . Polycystic ovary syndrome   . PONV (postoperative nausea and vomiting)   . Smoker    Past Surgical History:  Procedure Laterality Date  . CHOLECYSTECTOMY  01/10/2011   Procedure: LAPAROSCOPIC CHOLECYSTECTOMY;  Surgeon: Jamesetta So;  Location: [facility];  Service: General;  Laterality:  N/A;  . HYDRADENITIS EXCISION Right 06/18/2012   Procedure: EXCISION HYDRIADENITIS SUPRATIVA  RIGHT AXILLA;  Surgeon: Scherry Ran, MD;  Location: [facility];  Service: General;  Laterality: Right;  . LIVER BIOPSY  01/10/2011   Procedure: LIVER BIOPSY;  Surgeon: Jamesetta So;  Location: [facility];  Service: General;;  . SKIN SPLIT GRAFT Right 06/18/2012    Procedure: SKIN GRAFT SPLIT THICKNESS RIGHT AXILLA(WITH TWO STANDARD SKIN BOARDS 3"x 8" )  DONOR SITE RIGHT AND LEFT THIGHS;  Surgeon: Scherry Ran, MD;  Location: [facility];  Service: General;  Laterality: Right;   Social History:  reports that she quit smoking about 6 months ago. Her smoking use included cigarettes. She has a 10.00 pack-year smoking history. She has never used smokeless tobacco. She reports current alcohol use. She reports that she does not use drugs.   Family History  Problem Relation Age of Onset  . Stroke Other        paternal great grandfather  . Diabetes Father   . Hypertension Father   . Cancer Maternal Grandfather        adenocarcinoma  . Emphysema Maternal Grandfather        smoked  . Heart attack Paternal Grandfather   . Hypertension Paternal Grandmother   . Diabetes Paternal Grandmother   . Gout Paternal Grandmother   . Hypertension Maternal Grandmother   . Other Mother        pre-cancerous cells; had hyst  . Emphysema Mother        smoker  . Gout Brother   . Cancer Sister        cervical; had hyst  . Asthma Maternal Uncle   . Colon cancer Neg Hx   . Liver disease Neg Hx   . Liver cancer Neg Hx      Allergies  Allergen Reactions  . Ace Inhibitors Anaphylaxis  . Beta Adrenergic Blockers Other (See Comments)    Angioedema   . Augmentin [Amoxicillin-Pot Clavulanate] Other (See Comments)    Has had C.diff in the past  . Benicar [Olmesartan] Other (See Comments)    Facial edema  . Doxycycline Other (See Comments)    Makes feet and hands red  . Eggs Or Egg-Derived Products     Local reaction with injectable meds that contain eggs  . Influenza Vaccine Live Swelling    Arm   . Invokana [Canagliflozin] Other (See Comments)    Dehydration      Prior to Admission medications   Medication Sig Start Date End Date Taking? Authorizing Provider  albuterol (PROVENTIL HFA;VENTOLIN HFA) 108 (90 Base) MCG/ACT inhaler Inhale 2 puffs into the lungs  every 6 (six) hours as needed for wheezing or shortness of breath. 08/31/17   Isaac Bliss, Rayford Halsted, MD  albuterol (PROVENTIL) (5 MG/ML) 0.5% nebulizer solution Take 0.5 mLs (2.5 mg total) by nebulization every 6 (six) hours as needed for wheezing or shortness of breath. 08/23/17   Jacqlyn Larsen, PA-C  allopurinol (ZYLOPRIM) 300 MG tablet Take 300 mg by mouth every evening.     [provider]  Aspirin-Acetaminophen-Caffeine (EXCEDRIN MIGRAINE PO) Take by mouth as directed.    [provider]  Calcium Carb-Cholecalciferol [phone] MG-UNIT CAPS Take 2 capsules by mouth every evening.    [provider]  calcium carbonate (TUMS CHEWY BITES) 750 MG chewable tablet Chew 1 tablet by mouth as needed for heartburn.    [provider]  colchicine 0.6 MG tablet Take 0.6 mg  by mouth daily as needed.    [provider]  fexofenadine (ALLEGRA) 180 MG tablet Take 180 mg by mouth every evening.    [provider]  fluticasone (FLONASE) 50 MCG/ACT nasal spray Place 2 sprays into both nostrils every evening.    [provider]  hydrochlorothiazide (MICROZIDE) 12.5 MG capsule Take 1 capsule (12.5 mg total) by mouth daily. 02/17/18   Jonnie Kind, MD  ibuprofen (ADVIL,MOTRIN) 200 MG tablet Take 800 mg by mouth every 6 (six) hours as needed for mild pain or moderate pain.    [provider]  Insulin Glargine-Lixisenatide (SOLIQUA) 100-33 UNT-MCG/ML SOPN Inject 56 Units into the skin at bedtime.     [provider]  insulin lispro (HUMALOG) 100 UNIT/ML injection Sliding scale 4 times a day    [provider]  KOMBIGLYZE XR 2.05-998 MG TB24 Take 1 tablet by mouth 2 (two) times daily.  02/13/16   [provider]  medroxyPROGESTERone (PROVERA) 10 MG tablet Take 1 tablet (10 mg total) by mouth daily. One tablet daily x 2 weeks. Repeat as directed 02/17/18   Jonnie Kind, MD  pantoprazole (PROTONIX) 40 MG tablet Take 1  tablet (40 mg total) by mouth daily. 09/01/17   Isaac Bliss, Rayford Halsted, MD    Review of Systems:  Constitutional:  No weight loss, night sweats, Head&Eyes: No headache.  No vision loss.  No eye pain or scotoma ENT:  No Difficulty swallowing,Tooth/dental problems,Sore throat,  No ear ache, post nasal drip,  Cardio-vascular:  No chest pain, Orthopnea, PND, swelling in lower extremities,  dizziness, palpitations  GI:  No  abdominal pain, nausea, vomiting, diarrhea, loss of appetite, hematochezia, melena, heartburn, indigestion, Resp:  No coughing up of blood .No wheezing.No chest wall deformity  Skin:  no rash or lesions.  GU:  no dysuria, change in color of urine, no urgency or frequency. No flank pain.  Musculoskeletal:  No joint pain or swelling. No decreased range of motion. No back pain.  Psych:  No change in mood or affect. No depression or anxiety. Neurologic: No headache, no dysesthesia, no focal weakness, no vision loss. No syncope  Physical Exam: Vitals:   03/18/18 1153 03/18/18 1204 03/18/18 1211 03/18/18 1230  BP:   131/77 132/75  Pulse:  99 98 (!) 111  Resp:  (!) 29 16 (!) 22  Temp:      TempSrc:      SpO2: 95% 99% 100% 100%  Weight:      Height:       General:  A&O x 3, NAD, nontoxic, pleasant/cooperative Head/Eye: No conjunctival hemorrhage, no icterus, [place]/AT, No nystagmus ENT:  No icterus,  No thrush, good dentition, no pharyngeal exudate Neck:  No masses, no lymphadenpathy, no bruits CV:  RRR, no rub, no gallop, no S3 Lung: Bilateral scattered rales.  Bibasilar wheezing.  Good air movement. Abdomen: soft/NT, +BS, nondistended, no peritoneal signs Ext: No cyanosis, No rashes, No petechiae, No lymphangitis, No edema Neuro: CNII-XII intact, strength 4/5 in bilateral upper and lower extremities, no dysmetria  Labs on Admission:  Basic Metabolic Panel: Recent Labs  Lab 03/18/18 1150  NA 136  K 3.6  CL 107  CO2 18*  GLUCOSE 255*  BUN 8   CREATININE 0.57  CALCIUM 8.7*   Liver Function Tests: No results for input(s): AST, ALT, ALKPHOS, BILITOT, PROT, ALBUMIN in the last 168 hours. No results for input(s): LIPASE, AMYLASE in the last 168 hours. No results for input(s):  AMMONIA in the last 168 hours. CBC: Recent Labs  Lab 03/18/18 1150  WBC 8.2  NEUTROABS 5.5  HGB 14.4  HCT 44.2  MCV 88.6  PLT 238   Coagulation Profile: No results for input(s): INR, PROTIME in the last 168 hours. Cardiac Enzymes: No results for input(s): CKTOTAL, CKMB, CKMBINDEX, TROPONINI in the last 168 hours. BNP: Invalid input(s): POCBNP CBG: Recent Labs  Lab 03/18/18 1140  GLUCAP 248*   Urine analysis:    Component Value Date/Time   COLORURINE YELLOW 08/17/2012 0339   APPEARANCEUR CLEAR 08/17/2012 0339   LABSPEC 1.020 08/17/2012 0339   PHURINE 5.5 08/17/2012 0339   GLUCOSEU >1000 (A) 08/17/2012 0339   HGBUR TRACE (A) 08/17/2012 0339   BILIRUBINUR NEGATIVE 08/17/2012 0339   KETONESUR NEGATIVE 08/17/2012 0339   PROTEINUR NEGATIVE 08/17/2012 0339   UROBILINOGEN 0.2 08/17/2012 0339   NITRITE NEGATIVE 08/17/2012 0339   LEUKOCYTESUR NEGATIVE 08/17/2012 0339   Sepsis Labs: @LABRCNTIP (procalcitonin:4,lacticidven:4) ) Recent Results (from the past 240 hour(s))  Blood culture (routine x 2)     Status: None (Preliminary result)   Collection Time: 03/18/18 11:50 AM  Result Value Ref Range Status   Specimen Description BLOOD RIGHT HAND DRAWN BY RN  Final   Special Requests   Final    BOTTLES DRAWN AEROBIC AND ANAEROBIC Blood Culture results may not be optimal due to an inadequate volume of blood received in culture bottles Performed at [facility], [address], [place], [place] [address]    Culture PENDING  Incomplete   Report Status PENDING  Incomplete  Blood culture (routine x 2)     Status: None (Preliminary result)   Collection Time: 03/18/18 11:50 AM  Result Value Ref Range Status   Specimen Description BLOOD RIGHT ARM  DRAWN BY RN  Final   Special Requests   Final    BOTTLES DRAWN AEROBIC AND ANAEROBIC Blood Culture adequate volume Performed at [facility], [address], [place], [place] [address]    Culture PENDING  Incomplete   Report Status PENDING  Incomplete     Radiological Exams on Admission: No results found.  EKG: Independently reviewed. Sinus, nonspecific T wave change    Time spent:60 minutes Code Status:   FULL Family Communication:  Mother updated at bedside 3/12 Disposition Plan: expect 2-3 day hospitalization Consults called: none DVT Prophylaxis: [place] Lovenox  Orson Eva, DO  [facility] Pager [phone]  If 7PM-7AM, please contact night-coverage [email or web address] Password [email or web address] 03/18/2018, 1:24 PM

## 2018-03-18 NOTE — ED Notes (Signed)
While hour long neb was started pt began c/o increased chest tightness from a 4/10 to a 8/10. EKG performed and MD at bedside. Bi-pap started by respiratory.

## 2018-03-18 NOTE — ED Notes (Addendum)
ED TO INPATIENT HANDOFF REPORT  ED Nurse Name and Phone #: Maralyn Sago [phone]  S Name/Age/Gender Aris Lot 33 y.o. female Room/Bed: APA02/APA02  Code Status   Code Status: Prior  Home/SNF/Other Home Patient oriented to: self, place, time and situation Is this baseline? Yes   Triage Complete: Triage complete  Chief Complaint wheezing sob  Triage Note Pt reports SOB since Saturday, was seen at PCP today and sent by Dr. Margo Aye to rule out sepsis, pneumonia and Coronavirus; pt reports she has been wheezing and sob, with fever, has taken 3 abx with no relief (augmentin, doxycycline, Levaquin)     Allergies Allergies  Allergen Reactions  . Ace Inhibitors Anaphylaxis  . Beta Adrenergic Blockers Other (See Comments)    Angioedema   . Augmentin [Amoxicillin-Pot Clavulanate] Other (See Comments)    Has had C.diff in the past  . Benicar [Olmesartan] Other (See Comments)    Facial edema  . Doxycycline Other (See Comments)    Makes feet and hands red  . Eggs Or Egg-Derived Products     Local reaction with injectable meds that contain eggs  . Influenza Vaccine Live Swelling    Arm   . Invokana [Canagliflozin] Other (See Comments)    Dehydration     Level of Care/Admitting Diagnosis ED Disposition    ED Disposition Condition Comment   Admit  Hospital Area: [facility] [[id number]]  Level of Care: Stepdown [14]  Diagnosis: Acute respiratory failure with hypoxia [facility]) [[id number]]  Admitting Physician: TAT, DAVID [[id number]]  Attending Physician: TAT, DAVID [[id number]]  Estimated length of stay: past midnight tomorrow  Certification:: I certify this patient will need inpatient services for at least 2 midnights  PT Class (Do Not Modify): Inpatient [101]  PT Acc Code (Do Not Modify): Private [1]       B Medical/Surgery History Past Medical History:  Diagnosis Date  . Acid reflux   . Asthma   . Clostridium difficile infection    in the past  . Diabetes mellitus   . Fatty  liver   . Fibrocystic breast disease   . Gout   . Hidradenitis   . History of kidney stones   . Hyperlipemia   . Obesity   . Polycystic ovary syndrome   . PONV (postoperative nausea and vomiting)   . Smoker    Past Surgical History:  Procedure Laterality Date  . CHOLECYSTECTOMY  01/10/2011   Procedure: LAPAROSCOPIC CHOLECYSTECTOMY;  Surgeon: Dalia Heading;  Location: [facility];  Service: General;  Laterality: N/A;  . HYDRADENITIS EXCISION Right 06/18/2012   Procedure: EXCISION HYDRIADENITIS SUPRATIVA  RIGHT AXILLA;  Surgeon: Marlane Hatcher, MD;  Location: [facility];  Service: General;  Laterality: Right;  . LIVER BIOPSY  01/10/2011   Procedure: LIVER BIOPSY;  Surgeon: Dalia Heading;  Location: [facility];  Service: General;;  . SKIN SPLIT GRAFT Right 06/18/2012   Procedure: SKIN GRAFT SPLIT THICKNESS RIGHT AXILLA(WITH TWO STANDARD SKIN BOARDS 3"x 8" )  DONOR SITE RIGHT AND LEFT THIGHS;  Surgeon: Marlane Hatcher, MD;  Location: [facility];  Service: General;  Laterality: Right;     A IV Location/Drains/Wounds Patient Lines/Drains/Airways Status   Active Line/Drains/Airways    Name:   Placement date:   Placement time:   Site:   Days:   Peripheral IV 03/18/18 Right Hand   03/18/18    1200    Hand   less than 1          Intake/Output  Last 24 hours  Intake/Output Summary (Last 24 hours) at 03/18/2018 1431 Last data filed at 03/18/2018 1348 Gross per 24 hour  Intake 147.78 ml  Output -  Net 147.78 ml    Labs/Imaging Results for orders placed or performed during the hospital encounter of 03/18/18 (from the past 48 hour(s))  CBG monitoring, ED     Status: Abnormal   Collection Time: 03/18/18 11:40 AM  Result Value Ref Range   Glucose-Capillary 248 (H) 70 - 99 mg/dL  CBC with Differential     Status: None   Collection Time: 03/18/18 11:50 AM  Result Value Ref Range   WBC 8.2 4.0 - 10.5 K/uL   RBC 4.99 3.87 - 5.11 MIL/uL   Hemoglobin 14.4 12.0 - 15.0 g/dL   HCT 68.3 41.9 - 62.2 %    MCV 88.6 80.0 - 100.0 fL   MCH 28.9 26.0 - 34.0 pg   MCHC 32.6 30.0 - 36.0 g/dL   RDW 29.7 98.9 - 21.1 %   Platelets 238 150 - 400 K/uL   nRBC 0.0 0.0 - 0.2 %   Neutrophils Relative % 66 %   Neutro Abs 5.5 1.7 - 7.7 K/uL   Lymphocytes Relative 23 %   Lymphs Abs 1.9 0.7 - 4.0 K/uL   Monocytes Relative 6 %   Monocytes Absolute 0.5 0.1 - 1.0 K/uL   Eosinophils Relative 4 %   Eosinophils Absolute 0.3 0.0 - 0.5 K/uL   Basophils Relative 1 %   Basophils Absolute 0.1 0.0 - 0.1 K/uL   Immature Granulocytes 0 %   Abs Immature Granulocytes 0.02 0.00 - 0.07 K/uL    Comment: Performed at [facility], [address], [place], [place] [address]  Basic metabolic panel     Status: Abnormal   Collection Time: 03/18/18 11:50 AM  Result Value Ref Range   Sodium 136 135 - 145 mmol/L   Potassium 3.6 3.5 - 5.1 mmol/L   Chloride 107 98 - 111 mmol/L   CO2 18 (L) 22 - 32 mmol/L   Glucose, Bld 255 (H) 70 - 99 mg/dL   BUN 8 6 - 20 mg/dL   Creatinine, Ser 0.81 0.44 - 1.00 mg/dL   Calcium 8.7 (L) 8.9 - 10.3 mg/dL   GFR calc non Af Amer >60 >60 mL/min   GFR calc Af Amer >60 >60 mL/min   Anion gap 11 5 - 15    Comment: Performed at [facility], [address] [place], [place] [address]  Blood culture (routine x 2)     Status: None (Preliminary result)   Collection Time: 03/18/18 11:50 AM  Result Value Ref Range   Specimen Description BLOOD RIGHT HAND DRAWN BY RN    Special Requests      BOTTLES DRAWN AEROBIC AND ANAEROBIC Blood Culture results may not be optimal due to an inadequate volume of blood received in culture bottles Performed at [facility], [address] [place], [place] [address]    Culture PENDING    Report Status PENDING   Blood culture (routine x 2)     Status: None (Preliminary result)   Collection Time: 03/18/18 11:50 AM  Result Value Ref Range   Specimen Description BLOOD RIGHT ARM DRAWN BY RN    Special Requests      BOTTLES DRAWN AEROBIC AND ANAEROBIC Blood Culture adequate  volume Performed at [facility], [address], [place], [place] [address]    Culture PENDING    Report Status PENDING  Brain natriuretic peptide     Status: None   Collection Time: 03/18/18 11:50 AM  Result Value Ref Range   B Natriuretic Peptide 38.0 0.0 - 100.0 pg/mL    Comment: Performed at [facility], [address] [place], [place] [address]   Dg Chest Portable 1 View  Result Date: 03/18/2018 CLINICAL DATA:  Short of breath EXAM: PORTABLE CHEST 1 VIEW COMPARISON:  08/30/2017 FINDINGS: Hypoventilation. Patchy bibasilar airspace disease, with mild improvement on the right since the prior study. Negative for edema or effusion. IMPRESSION: Hypoventilation with bibasilar atelectasis. Electronically Signed   By: Marlan Palau M.D.   On: 03/18/2018 13:29    Pending Labs [employer] (From admission, onward)    Start     Ordered   Signed and Held  Respiratory Panel by PCR  (Respiratory virus panel with precautions)  Once,   R     Signed and Held   Signed and Held  HIV antibody (Routine Testing)  Once,   R     Signed and Held   Signed and Held  Hemoglobin A1c  Once,   R     Signed and Held   Signed and Armed forces training and education officer morning,   R     Signed and Held   Signed and Held  CBC  Tomorrow morning,   R     Signed and Held   Signed and Held  Procalcitonin - Baseline  ONCE - STAT,   STAT     Signed and Held          Vitals/Pain Today's Vitals   03/18/18 1211 03/18/18 1230 03/18/18 1300 03/18/18 1330  BP: 131/77 132/75 130/76 113/76  Pulse: 98 (!) 111 (!) 124 (!) 115  Resp: 16 (!) 22 20 (!) 21  Temp:      TempSrc:      SpO2: 100% 100% 100% 99%  Weight:      Height:      PainSc:        Isolation Precautions No active isolations  Medications Medications  albuterol (PROVENTIL,VENTOLIN) solution continuous neb (15 mg/hr Nebulization New Bag/Given 03/18/18 1151)  0.9 %  sodium chloride infusion ( Intravenous Rate/Dose Verify 03/18/18 1348)  albuterol  (PROVENTIL) (2.5 MG/3ML) 0.083% nebulizer solution 2.5 mg (2.5 mg Nebulization Given 03/18/18 1124)  ipratropium-albuterol (DUONEB) 0.5-2.5 (3) MG/3ML nebulizer solution 3 mL (3 mLs Nebulization Given 03/18/18 1125)  ipratropium (ATROVENT) nebulizer solution 1 mg (1 mg Nebulization Given 03/18/18 1151)  methylPREDNISolone sodium succinate (SOLU-MEDROL) 125 mg/2 mL injection 80 mg (80 mg Intravenous Given 03/18/18 1157)  magnesium sulfate IVPB 2 g 50 mL ( Intravenous Stopped 03/18/18 1228)  LORazepam (ATIVAN) injection 1 mg (1 mg Intravenous Given 03/18/18 1207)  terbutaline (BRETHINE) injection 0.25 mg (0.25 mg Subcutaneous Given 03/18/18 1207)  levofloxacin (LEVAQUIN) IVPB 500 mg ( Intravenous Stopped 03/18/18 1342)    Mobility walks Low fall risk   Focused Assessments    R Recommendations: See Admitting Provider Note  Report given to: [facility] ICU RN  Additional Notes:

## 2018-03-18 NOTE — ED Triage Notes (Signed)
Pt reports SOB since Saturday, was seen at PCP today and sent by Dr. Margo Aye to rule out sepsis, pneumonia and Coronavirus; pt reports she has been wheezing and sob, with fever, has taken 3 abx with no relief (augmentin, doxycycline, Levaquin)

## 2018-03-19 LAB — CBC
HCT: 42.7 % (ref 36.0–46.0)
Hemoglobin: 13.8 g/dL (ref 12.0–15.0)
MCH: 29.1 pg (ref 26.0–34.0)
MCHC: 32.3 g/dL (ref 30.0–36.0)
MCV: 89.9 fL (ref 80.0–100.0)
Platelets: 238 10*3/uL (ref 150–400)
RBC: 4.75 MIL/uL (ref 3.87–5.11)
RDW: 13.2 % (ref 11.5–15.5)
WBC: 13.9 10*3/uL — ABNORMAL HIGH (ref 4.0–10.5)
nRBC: 0 % (ref 0.0–0.2)

## 2018-03-19 LAB — BASIC METABOLIC PANEL
Anion gap: 7 (ref 5–15)
BUN: 11 mg/dL (ref 6–20)
CHLORIDE: 108 mmol/L (ref 98–111)
CO2: 20 mmol/L — AB (ref 22–32)
Calcium: 8.7 mg/dL — ABNORMAL LOW (ref 8.9–10.3)
Creatinine, Ser: 0.59 mg/dL (ref 0.44–1.00)
GFR calc Af Amer: >60 mL/min (ref >60)
GFR calc non Af Amer: >60 mL/min (ref >60)
Glucose, Bld: 276 mg/dL — ABNORMAL HIGH (ref 70–99)
Potassium: 4.2 mmol/L (ref 3.5–5.1)
Sodium: 135 mmol/L (ref 135–145)

## 2018-03-19 LAB — GLUCOSE, CAPILLARY
GLUCOSE-CAPILLARY: 345 mg/dL — AB (ref 70–99)
Glucose-Capillary: 231 mg/dL — ABNORMAL HIGH (ref 70–99)
Glucose-Capillary: 341 mg/dL — ABNORMAL HIGH (ref 70–99)
Glucose-Capillary: 321 mg/dL — ABNORMAL HIGH (ref 70–99)

## 2018-03-19 LAB — HIV ANTIBODY (ROUTINE TESTING W REFLEX): HIV Screen 4th Generation wRfx: NONREACTIVE

## 2018-03-19 MED ORDER — INSULIN ASPART 100 UNIT/ML ~~LOC~~ SOLN
6.0000 [IU] | Freq: Three times a day (TID) | SUBCUTANEOUS | Status: DC
  Administered 2018-03-19 – 2018-03-22 (×7): 6 [IU] via SUBCUTANEOUS

## 2018-03-19 NOTE — Progress Notes (Signed)
PROGRESS NOTE  Cheryl Black YQM:[id number] DOB: [birth year] DOA: 03/18/2018 PCP: [name], MD  Brief History:  33 y.o. female with medical history of diabetes mellitus type 2, PCOS, hyperlipidemia, obesity, tobacco abuse presenting with cough, congestion, and generalized weakness that began on 03/14/2018.  The patient had been running fevers up to 101.0 F at home.  She went to see her primary care provider on 03/15/2018 and was started on Augmentin and doxycycline.  The patient had taken 2 doses of Augmentin.  However, when she took the doxycycline, she developed a rash on her hands.  She stated that her breathing and coughing and chest congestion did not improve.  She went back to see her primary care provider on 03/18/2018.  The patient was given a prescription for levofloxacin, but her breathing was worsening.  As result, the patient was sent to emergency department for further evaluation.  The patient denies any recent travels outside of [place].  However, she works as an Therapist, sports in the emergency department.  Her last shift at work was on 03/10/2018.  The patient lives at home with her spouse who was recently discharged from the hospital on 03/12/2018 after a stay for treatment for COPD exacerbation.  He also smokes, but he has not traveled outside [place] in the past month.  The patient has not had any sick close contacts.  Patient has had some chest tightness with her coughing but she denies any nausea, vomiting, diarrhea, abdominal pain, headache, sore throat.  There is no abdominal pain, dysuria, hematuria.  She has been using her nebulizer machine at home without much improvement. In the emergency department, the patient developed some respiratory distress.  She was placed on BiPAP.  She was given bronchodilators and the Brethine.  She was started on Solu-Medrol and levofloxacin.  The patient was admitted for further evaluation and treatment.  Assessment/Plan: Acute Respiratory  Failure with Hypoxia -due to bronchospasm, likely due to underlying viral infection -pt does not have diagnosis of asthma or COPD -Patient had seen pulmonology, Dr. Melvyn Novas on 09/24/2016--PFTs did not suggest COPD or asthma -?reactive airway disease -stable on BiPAP>>>weaned to 6 L Krebs -PCT <0.10 -d/c levofloxacin  Acute Bronchospasm/reactive airway disease -personally reviewed CXR--interstitial coarsening without  consolidation -Viral respiratory panel-->+rhinovirus/enterovirus -Influenza rapid antigen test was negative in the office -Continue Pulmicort -Continue Xopenex -Continue Solu-Medrol -Continue loratadine  Diabetes mellitus type 2, uncontrolled with hyperglycemia -Hemoglobin A1c--8.9 -Continue Lantus 60 units daily -Resistant NovoLog sliding scale -Holding Kombiglyze -add novolog 6 units with meals  Morbid obesity -BMI 45.61 -Lifestyle modification  PCOS -Patient follows with Dr. [name] -Continue Provera  Tobacco Abuse -I have discussed tobacco cessation with the patient.  I have counseled the patient regarding the negative impacts of continued tobacco use including but not limited to lung cancer, COPD, and cardiovascular disease.  I have discussed alternatives to tobacco and modalities that may help facilitate tobacco cessation including but not limited to biofeedback, hypnosis, and medications.  Total time spent with tobacco counseling was 4 minutes.    Disposition Plan:   Home in 1-2 days  Family Communication:   Spouse update at bedside 3/13  Consultants:  none  Code Status:  FULL   DVT Prophylaxis:  [place] Lovenox   Procedures: As Listed in Progress Note Above  Antibiotics: Levofloxacin 3/12>>3/13     Subjective: Pt states breathing is a little better, but has dyspnea with minimal exertion.  Has  cough with yellow sputum, no hemoptysis.  Denies f/c, n/v/d  Objective: Vitals:   03/19/18 0500 03/19/18 0600 03/19/18 0759 03/19/18 1130  BP: 116/72      Pulse: 94 96    Resp: (!) 23 20    Temp:   98.7 F (37.1 C) 98.5 F (36.9 C)  TempSrc:   Oral Oral  SpO2: 91% 92%    Weight:      Height:        Intake/Output Summary (Last 24 hours) at 03/19/2018 1315 Last data filed at 03/19/2018 0844 Gross per 24 hour  Intake 313.19 ml  Output 200 ml  Net 113.19 ml   Weight change:  Exam:   General:  Pt is alert, follows commands appropriately, not in acute distress  HEENT: No icterus, No thrush, No neck mass, [place]/AT  Cardiovascular: RRR, S1/S2, no rubs, no gallops  Respiratory: bilateral scattered rales.  Bibasilar wheezing  Abdomen: Soft/+BS, non tender, non distended, no guarding  Extremities: No edema, No lymphangitis, No petechiae, No rashes, no synovitis   Data Reviewed: I have personally reviewed following labs and imaging studies Basic Metabolic Panel: Recent Labs  Lab 03/18/18 1150 03/19/18 0436  NA 136 135  K 3.6 4.2  CL 107 108  CO2 18* 20*  GLUCOSE 255* 276*  BUN 8 11  CREATININE 0.57 0.59  CALCIUM 8.7* 8.7*   Liver Function Tests: No results for input(s): AST, ALT, ALKPHOS, BILITOT, PROT, ALBUMIN in the last 168 hours. No results for input(s): LIPASE, AMYLASE in the last 168 hours. No results for input(s): AMMONIA in the last 168 hours. Coagulation Profile: No results for input(s): INR, PROTIME in the last 168 hours. CBC: Recent Labs  Lab 03/18/18 1150 03/19/18 0436  WBC 8.2 13.9*  NEUTROABS 5.5  --   HGB 14.4 13.8  HCT 44.2 42.7  MCV 88.6 89.9  PLT 238 238   Cardiac Enzymes: No results for input(s): CKTOTAL, CKMB, CKMBINDEX, TROPONINI in the last 168 hours. BNP: Invalid input(s): POCBNP CBG: Recent Labs  Lab 03/18/18 1140 03/18/18 1716 03/18/18 2110 03/19/18 0737 03/19/18 1132  GLUCAP 248* 294* 309* 231* 341*   HbA1C: Recent Labs    03/18/18 1552  HGBA1C 8.9*   Urine analysis:    Component Value Date/Time   COLORURINE YELLOW 08/17/2012 0339   APPEARANCEUR CLEAR 08/17/2012  0339   LABSPEC 1.020 08/17/2012 0339   PHURINE 5.5 08/17/2012 0339   GLUCOSEU >1000 (A) 08/17/2012 0339   HGBUR TRACE (A) 08/17/2012 0339   BILIRUBINUR NEGATIVE 08/17/2012 0339   KETONESUR NEGATIVE 08/17/2012 0339   PROTEINUR NEGATIVE 08/17/2012 0339   UROBILINOGEN 0.2 08/17/2012 0339   NITRITE NEGATIVE 08/17/2012 0339   LEUKOCYTESUR NEGATIVE 08/17/2012 0339   Sepsis Labs: @LABRCNTIP (procalcitonin:4,lacticidven:4) ) Recent Results (from the past 240 hour(s))  Blood culture (routine x 2)     Status: None (Preliminary result)   Collection Time: 03/18/18 11:50 AM  Result Value Ref Range Status   Specimen Description BLOOD RIGHT HAND DRAWN BY RN  Final   Special Requests   Final    BOTTLES DRAWN AEROBIC AND ANAEROBIC Blood Culture results may not be optimal due to an inadequate volume of blood received in culture bottles   Culture   Final    NO GROWTH < 24 HOURS Performed at [facility], [address] [place], [place] [address]    Report Status PENDING  Incomplete  Blood culture (routine x 2)     Status: None (Preliminary result)   Collection  Time: 03/18/18 11:50 AM  Result Value Ref Range Status   Specimen Description BLOOD RIGHT ARM DRAWN BY RN  Final   Special Requests   Final    BOTTLES DRAWN AEROBIC AND ANAEROBIC Blood Culture adequate volume   Culture   Final    NO GROWTH < 24 HOURS Performed at [facility], [address] [place], [place] [address]    Report Status PENDING  Incomplete  Respiratory Panel by PCR     Status: Abnormal   Collection Time: 03/18/18  3:27 PM  Result Value Ref Range Status   Adenovirus NOT DETECTED NOT DETECTED Final   Coronavirus 229E NOT DETECTED NOT DETECTED Final    Comment: (NOTE) The Coronavirus on the Respiratory Panel, DOES NOT test for the novel  Coronavirus (2019 nCoV)    Coronavirus HKU1 NOT DETECTED NOT DETECTED Final   Coronavirus NL63 NOT DETECTED NOT DETECTED Final   Coronavirus OC43 NOT DETECTED NOT DETECTED Final    Metapneumovirus NOT DETECTED NOT DETECTED Final   Rhinovirus / Enterovirus DETECTED (A) NOT DETECTED Final   Influenza A NOT DETECTED NOT DETECTED Final   Influenza B NOT DETECTED NOT DETECTED Final   Parainfluenza Virus 1 NOT DETECTED NOT DETECTED Final   Parainfluenza Virus 2 NOT DETECTED NOT DETECTED Final   Parainfluenza Virus 3 NOT DETECTED NOT DETECTED Final   Parainfluenza Virus 4 NOT DETECTED NOT DETECTED Final   Respiratory Syncytial Virus NOT DETECTED NOT DETECTED Final   Bordetella pertussis NOT DETECTED NOT DETECTED Final   Chlamydophila pneumoniae NOT DETECTED NOT DETECTED Final   Mycoplasma pneumoniae NOT DETECTED NOT DETECTED Final    Comment: Performed at [facility], [address], [place], [place] [address]  MRSA PCR Screening     Status: None   Collection Time: 03/18/18  3:27 PM  Result Value Ref Range Status   MRSA by PCR NEGATIVE NEGATIVE Final    Comment:        The GeneXpert MRSA Assay (FDA approved for NASAL specimens only), is one component of a comprehensive MRSA colonization surveillance program. It is not intended to diagnose MRSA infection nor to guide or monitor treatment for MRSA infections. Performed at [facility], [address] [place], [place] [address]      Scheduled Meds: . allopurinol  300 mg Oral QPM  . budesonide (PULMICORT) nebulizer solution  0.5 mg Nebulization BID  . calcium carbonate  1 tablet Oral BID WC   And  . cholecalciferol  1,000 Units Oral BID WC  . chlorhexidine  15 mL Mouth Rinse BID  . dextromethorphan-guaiFENesin  1 tablet Oral BID  . enoxaparin (LOVENOX) injection  70 mg Subcutaneous Q24H  . fluticasone  2 spray Each Nare QPM  . insulin aspart  0-20 Units Subcutaneous TID WC  . insulin aspart  0-5 Units Subcutaneous QHS  . insulin glargine  60 Units Subcutaneous QHS  . ipratropium  0.5 mg Nebulization Q6H  . levalbuterol  0.63 mg Nebulization Q6H  . levofloxacin  500 mg Oral Daily  . loratadine  10 mg  Oral Daily  . mouth rinse  15 mL Mouth Rinse q12n4p  . methylPREDNISolone (SOLU-MEDROL) injection  60 mg Intravenous Q6H  . pantoprazole  40 mg Oral Daily   Continuous Infusions: . sodium chloride Stopped (03/18/18 1506)  . albuterol 15 mg/hr (03/18/18 1151)    Procedures/Studies: Dg Chest Portable 1 View  Result Date: 03/18/2018 CLINICAL DATA:  Short of breath EXAM: PORTABLE CHEST 1 VIEW  COMPARISON:  08/30/2017 FINDINGS: Hypoventilation. Patchy bibasilar airspace disease, with mild improvement on the right since the prior study. Negative for edema or effusion. IMPRESSION: Hypoventilation with bibasilar atelectasis. Electronically Signed   By: Franchot Gallo M.D.   On: 03/18/2018 13:29    Orson Eva, DO  [facility] Pager [phone]  If 7PM-7AM, please contact night-coverage [email or web address] Password [email or web address] 03/19/2018, 1:15 PM   LOS: 1 day

## 2018-03-19 NOTE — Progress Notes (Signed)
Inpatient Diabetes Program Recommendations  AACE/ADA: New Consensus Statement on Inpatient Glycemic Control (2015)  Target Ranges:  Prepandial:   less than 140 mg/dL      Peak postprandial:   less than 180 mg/dL (1-2 hours)      Critically ill patients:  140 - 180 mg/dL   Lab Results  Component Value Date   GLUCAP 341 (H) 03/19/2018   HGBA1C 8.9 (H) 03/18/2018   Results for KAMAR, TOLSON (MRN [id number]) as of 03/19/2018 12:25  Ref. Range 03/18/2018 11:40 03/18/2018 17:16 03/18/2018 21:10 03/19/2018 07:37 03/19/2018 11:32  Glucose-Capillary Latest Ref Range: 70 - 99 mg/dL 250 (H) 539 (H) 767 (H) 231 (H) 341 (H)   Review of Glycemic Control  Diabetes history: Type 2 Outpatient Diabetes medications: Soliqua 60 unit every HS, Humalog 10-22 units four times/day, Kombiglyze XR Current orders for Inpatient glycemic control: Lantus 60 units every HS, Novolog RESISTANT correction scale TID & HS  Inpatient Diabetes Program Recommendations:   Noted that blood sugars continue to be elevated and greater than 180 mg/dl. Patient on steroids. Recommend adding Novolog 6 units TID as meal coverage if patient eats at least 50% of meals and while on steroids. Will continue to monitor blood sugars while in the hospital.  Smith Mince RN BSN CDE Diabetes Coordinator Pager: [phone]  8am-5pm

## 2018-03-20 ENCOUNTER — Inpatient Hospital Stay (HOSPITAL_COMMUNITY): Payer: 59

## 2018-03-20 LAB — BLOOD GAS, ARTERIAL
Acid-Base Excess: 2.6 mmol/L — ABNORMAL HIGH (ref 0.0–2.0)
Bicarbonate: 25.8 mmol/L (ref 20.0–28.0)
Delivery systems: POSITIVE
Drawn by: 22223
FIO2: 100
O2 Saturation: 97.1 %
Patient temperature: 37
pCO2 arterial: 34.7 mmHg (ref 32.0–48.0)
pH, Arterial: 7.485 — ABNORMAL HIGH (ref 7.350–7.450)
pO2, Arterial: 91.8 mmHg (ref 83.0–108.0)

## 2018-03-20 LAB — GLUCOSE, CAPILLARY
GLUCOSE-CAPILLARY: 337 mg/dL — AB (ref 70–99)
Glucose-Capillary: 269 mg/dL — ABNORMAL HIGH (ref 70–99)
Glucose-Capillary: 349 mg/dL — ABNORMAL HIGH (ref 70–99)
Glucose-Capillary: 242 mg/dL — ABNORMAL HIGH (ref 70–99)

## 2018-03-20 LAB — BRAIN NATRIURETIC PEPTIDE: B Natriuretic Peptide: 129 pg/mL — ABNORMAL HIGH (ref 0.0–100.0)

## 2018-03-20 MED ORDER — IPRATROPIUM BROMIDE 0.02 % IN SOLN
RESPIRATORY_TRACT | Status: AC
  Administered 2018-03-20: 0.5 mg
  Filled 2018-03-20: qty 2.5

## 2018-03-20 MED ORDER — CHLORHEXIDINE GLUCONATE CLOTH 2 % EX PADS
6.0000 | MEDICATED_PAD | Freq: Every day | CUTANEOUS | Status: DC
  Administered 2018-03-20 – 2018-03-22 (×3): 6 via TOPICAL

## 2018-03-20 NOTE — Progress Notes (Signed)
Pt eating then bathing so nebulizer treatment is late

## 2018-03-20 NOTE — Progress Notes (Signed)
Patient appears to be slowly getting worse with oxygenation. She is on 100 oxygen and her PO2 is 91.8 this also being with 7 of BIPAP pressure being added. She is wheezing , respirations in 30's and increase to 40"s.with  exertion. Saturation off BiPAP drops quickly in to 80's. PH is 7.485 with 34.7 PCO2  So her ventilation appear to be good. HCO3 is 25.8. early 3/12 BNP  38 and fluids are positive?? Also is concern for PE which has not been checked Another concern is she feels better lying flat than sitting up on BiPAP this is not usually the case. Pressure on diaphragm ?? Will treat as reactive airway by increasing treatments to see if this makes her feel better.

## 2018-03-20 NOTE — Progress Notes (Signed)
Pt placed home CPAP with 10L O2 in line due to complaints of SOB & low O2 saturations. Pt continue to have difficulties and was placed on [facility] V-60 BIPAP. Pt states that she feels much better and will wear BIPAP for now

## 2018-03-20 NOTE — Progress Notes (Signed)
Patient has drifted off to sleep lying on feft side rsep rate has decreased to 18 , Saturation increased to 100 on 100 BiPAP 14/7 will decreased oxygen as tolerated.

## 2018-03-20 NOTE — Progress Notes (Signed)
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  She stated that she had a similar presentation in August last year and work-up did not show any COPD or asthma or CHF  Subjective: Patient seen and examined at bedside.  Patient is lying in bed with BiPAP on patient was noted to be desaturating on her oxygen with high flow oxygen and had to be put back on BiPAP this morning she continues to require BiPAP with high oxygen  Assessment/Plan: Active Problems:   Morbid obesity (HCC)   Tobacco abuse   Bronchospasm, acute   Acute respiratory failure with hypoxia (HCC)   Uncontrolled type 2 diabetes mellitus with hyperglycemia (HCC)   Acute respiratory failure with hypoxia, most likely due to bronchospasm from viral infection she is positive for the rhinovirus she has not traveled outside the country or to [place].  We will continue BiPAP  Diabetes mellitus uncontrolled her blood sugars is in the 300s we will continue sliding scale  Reactive airway disease with white count elevated and 13.9 continue levofloxacin  Morbid obesity BMI 45.6 lifestyle modification will be very beneficial  Code Status: Full  Severity of Illness: The appropriate patient status for this patient is INPATIENT. Inpatient status is judged to be reasonable and necessary in order to provide the required intensity of service to ensure the patient's safety. The patient's presenting symptoms, physical exam findings, and initial radiographic and laboratory data in the context of their chronic comorbidities is felt to place them at high risk for further clinical deterioration. Furthermore, it is not anticipated that the patient will  be medically stable for discharge from the hospital within 2 midnights of admission. The following factors support the patient status of inpatient.   " Patient continues to be in respiratory failure requiring oxygen  BiPAP * I certify that at the point of admission it is my clinical judgment that the patient will require inpatient hospital care spanning beyond 2 midnights from the point of admission due to high intensity of service, high risk for further deterioration and high frequency of surveillance required.*    Family Communication: Husband James at bedside  Disposition Plan: Home when stable   Consultants:  Pulmonary  Procedures:  None  Antimicrobials:  Levofloxacin  DVT prophylaxis: Lovenox   Objective: Vitals:   03/20/18 1200 03/20/18 1300 03/20/18 1400 03/20/18 1500  BP:    126/84  Pulse: 78 62 71 62  Resp:      Temp:      TempSrc:      SpO2: 96% 96% 94% 95%  Weight:      Height:        Intake/Output Summary (Last 24 hours) at 03/20/2018 1602 Last data filed at 03/20/2018 1319 Gross per 24 hour  Intake 960 ml  Output -  Net 960 ml   Filed Weights   03/18/18 1117 03/20/18 0500  Weight: 136.1 kg 134.6 kg   Body mass index is 45.12 kg/m.  Exam:  . General: 33 y.o. year-old female well developed well nourished in no acute distress.  Alert and oriented x3.  Slightly obese BiPAP in use lying quietly in bed looking slightly anxious . Cardiovascular: Regular rate and rhythm with no rubs or gallops.  No thyromegaly or JVD noted.   Marland Kitchen Respiratory: Bilateral rhonchi and wheezes and rales. Good inspiratory effort. . Abdomen: Soft nontender nondistended with normal bowel sounds x4 quadrants. . Musculoskeletal: No lower extremity edema. 2/4 pulses in all 4 extremities. . Skin: No ulcerative lesions noted or rashes, . Psychiatry: Mood is appropriate for condition and setting likely anxious    Data Reviewed: CBC: Recent Labs  Lab 03/18/18 1150 03/19/18  0436  WBC 8.2 13.9*  NEUTROABS 5.5  --   HGB 14.4 13.8  HCT 44.2 42.7  MCV 88.6 89.9  PLT 238 238   Basic Metabolic Panel: Recent Labs  Lab 03/18/18 1150 03/19/18 0436  NA 136 135  K 3.6 4.2  CL 107 108  CO2 18* 20*  GLUCOSE 255* 276*  BUN 8 11  CREATININE 0.57 0.59  CALCIUM 8.7* 8.7*   GFR: Estimated Creatinine Clearance: 144.2 mL/min (by C-G formula based on SCr of 0.59 mg/dL). Liver Function Tests: No results for input(s): AST, ALT, ALKPHOS, BILITOT, PROT, ALBUMIN in the last 168 hours. No results for input(s): LIPASE, AMYLASE in the last 168 hours. No results for input(s): AMMONIA in the last 168 hours. Coagulation Profile: No results for input(s): INR, PROTIME in the last 168 hours. Cardiac Enzymes: No results for input(s): CKTOTAL, CKMB, CKMBINDEX, TROPONINI in the last 168 hours. BNP (last 3 results) No results for input(s): PROBNP in the last 8760 hours. HbA1C: Recent Labs    03/18/18 1552  HGBA1C 8.9*   CBG: Recent Labs  Lab 03/19/18 1625 03/19/18 2111 03/20/18 0734 03/20/18 1144 03/20/18 1555  GLUCAP 321* 345* 269* 349* 242*   Lipid Profile: No results for input(s): CHOL, HDL, LDLCALC, TRIG, CHOLHDL, LDLDIRECT in the last 72 hours. Thyroid Function Tests: No results for input(s): TSH, T4TOTAL, FREET4, T3FREE, THYROIDAB in the last 72 hours. Anemia Panel: No results for input(s): VITAMINB12, FOLATE, FERRITIN, TIBC, IRON, RETICCTPCT in the last 72 hours. Urine analysis:    Component Value Date/Time   COLORURINE YELLOW 08/17/2012 0339   APPEARANCEUR CLEAR 08/17/2012 0339   LABSPEC 1.020 08/17/2012 0339   PHURINE 5.5 08/17/2012 0339   GLUCOSEU >1000 (A) 08/17/2012 0339   HGBUR TRACE (A) 08/17/2012 0339   BILIRUBINUR NEGATIVE 08/17/2012 0339   KETONESUR NEGATIVE 08/17/2012 0339   PROTEINUR NEGATIVE 08/17/2012 0339   UROBILINOGEN 0.2 08/17/2012 0339   NITRITE NEGATIVE 08/17/2012 0339   LEUKOCYTESUR NEGATIVE 08/17/2012 0339   Sepsis Labs:  @LABRCNTIP (procalcitonin:4,lacticidven:4)  ) Recent Results (from the past 240 hour(s))  Blood culture (routine x 2)     Status: None (Preliminary result)   Collection Time: 03/18/18 11:50 AM  Result Value Ref Range Status   Specimen Description BLOOD RIGHT HAND DRAWN BY RN  Final   Special Requests   Final    BOTTLES DRAWN AEROBIC AND ANAEROBIC Blood Culture results may not be optimal due to an inadequate volume of blood received in culture bottles   Culture   Final    NO GROWTH 2 DAYS Performed at [facility], [address], [place], [place] [address]    Report Status PENDING  Incomplete  Blood culture (routine x 2)     Status: None (Preliminary result)   Collection Time: 03/18/18 11:50 AM  Result Value Ref Range Status   Specimen Description BLOOD RIGHT ARM DRAWN BY RN  Final   Special Requests   Final    BOTTLES DRAWN AEROBIC AND ANAEROBIC Blood Culture adequate volume   Culture   Final  NO GROWTH 2 DAYS Performed at [facility], [address], [place], [place] [address]    Report Status PENDING  Incomplete  Respiratory Panel by PCR     Status: Abnormal   Collection Time: 03/18/18  3:27 PM  Result Value Ref Range Status   Adenovirus NOT DETECTED NOT DETECTED Final   Coronavirus 229E NOT DETECTED NOT DETECTED Final    Comment: (NOTE) The Coronavirus on the Respiratory Panel, DOES NOT test for the novel  Coronavirus (2019 nCoV)    Coronavirus HKU1 NOT DETECTED NOT DETECTED Final   Coronavirus NL63 NOT DETECTED NOT DETECTED Final   Coronavirus OC43 NOT DETECTED NOT DETECTED Final   Metapneumovirus NOT DETECTED NOT DETECTED Final   Rhinovirus / Enterovirus DETECTED (A) NOT DETECTED Final   Influenza A NOT DETECTED NOT DETECTED Final   Influenza B NOT DETECTED NOT DETECTED Final   Parainfluenza Virus 1 NOT DETECTED NOT DETECTED Final   Parainfluenza Virus 2 NOT DETECTED NOT DETECTED Final   Parainfluenza Virus 3 NOT DETECTED NOT DETECTED Final   Parainfluenza Virus 4  NOT DETECTED NOT DETECTED Final   Respiratory Syncytial Virus NOT DETECTED NOT DETECTED Final   Bordetella pertussis NOT DETECTED NOT DETECTED Final   Chlamydophila pneumoniae NOT DETECTED NOT DETECTED Final   Mycoplasma pneumoniae NOT DETECTED NOT DETECTED Final    Comment: Performed at [facility], [address], [place], [place] [address]  MRSA PCR Screening     Status: None   Collection Time: 03/18/18  3:27 PM  Result Value Ref Range Status   MRSA by PCR NEGATIVE NEGATIVE Final    Comment:        The GeneXpert MRSA Assay (FDA approved for NASAL specimens only), is one component of a comprehensive MRSA colonization surveillance program. It is not intended to diagnose MRSA infection nor to guide or monitor treatment for MRSA infections. Performed at [facility], [address] [place], [place] [address]       Studies: No results found.  Scheduled Meds: . allopurinol  300 mg Oral QPM  . budesonide (PULMICORT) nebulizer solution  0.5 mg Nebulization BID  . calcium carbonate  1 tablet Oral BID WC   And  . cholecalciferol  1,000 Units Oral BID WC  . chlorhexidine  15 mL Mouth Rinse BID  . Chlorhexidine Gluconate Cloth  6 each Topical Daily  . dextromethorphan-guaiFENesin  1 tablet Oral BID  . enoxaparin (LOVENOX) injection  70 mg Subcutaneous Q24H  . fluticasone  2 spray Each Nare QPM  . insulin aspart  0-20 Units Subcutaneous TID WC  . insulin aspart  0-5 Units Subcutaneous QHS  . insulin aspart  6 Units Subcutaneous TID WC  . insulin glargine  60 Units Subcutaneous QHS  . ipratropium  0.5 mg Nebulization Q6H  . levalbuterol  0.63 mg Nebulization Q6H  . levofloxacin  500 mg Oral Daily  . loratadine  10 mg Oral Daily  . mouth rinse  15 mL Mouth Rinse q12n4p  . methylPREDNISolone (SOLU-MEDROL) injection  60 mg Intravenous Q6H  . pantoprazole  40 mg Oral Daily    Continuous Infusions: . sodium chloride Stopped (03/18/18 1506)  . albuterol 15 mg/hr (03/18/18  1151)     LOS: 2 days     Myrtie Neither, MD [facility]  To reach me or the doctor on call, go to: [email or web address] Password [facility]  03/20/2018, 4:02 PM

## 2018-03-21 ENCOUNTER — Inpatient Hospital Stay (HOSPITAL_COMMUNITY): Payer: 59

## 2018-03-21 LAB — BASIC METABOLIC PANEL
Anion gap: 9 (ref 5–15)
BUN: 21 mg/dL — ABNORMAL HIGH (ref 6–20)
CO2: 25 mmol/L (ref 22–32)
Calcium: 8.4 mg/dL — ABNORMAL LOW (ref 8.9–10.3)
Chloride: 101 mmol/L (ref 98–111)
Creatinine, Ser: 0.58 mg/dL (ref 0.44–1.00)
GFR calc Af Amer: >60 mL/min (ref >60)
Glucose, Bld: 278 mg/dL — ABNORMAL HIGH (ref 70–99)
Potassium: 4.1 mmol/L (ref 3.5–5.1)
Sodium: 135 mmol/L (ref 135–145)

## 2018-03-21 LAB — GLUCOSE, CAPILLARY
GLUCOSE-CAPILLARY: 302 mg/dL — AB (ref 70–99)
Glucose-Capillary: 250 mg/dL — ABNORMAL HIGH (ref 70–99)
Glucose-Capillary: 330 mg/dL — ABNORMAL HIGH (ref 70–99)
Glucose-Capillary: 350 mg/dL — ABNORMAL HIGH (ref 70–99)

## 2018-03-21 LAB — CBC
HCT: 42.7 % (ref 36.0–46.0)
Hemoglobin: 13.7 g/dL (ref 12.0–15.0)
MCH: 29.2 pg (ref 26.0–34.0)
MCHC: 32.1 g/dL (ref 30.0–36.0)
MCV: 91 fL (ref 80.0–100.0)
PLATELETS: 256 10*3/uL (ref 150–400)
RBC: 4.69 MIL/uL (ref 3.87–5.11)
RDW: 13.2 % (ref 11.5–15.5)
WBC: 16.7 10*3/uL — AB (ref 4.0–10.5)
nRBC: 0 % (ref 0.0–0.2)

## 2018-03-21 MED ORDER — BENZONATATE 100 MG PO CAPS
200.0000 mg | ORAL_CAPSULE | Freq: Three times a day (TID) | ORAL | Status: DC | PRN
  Administered 2018-03-21: 200 mg via ORAL
  Filled 2018-03-21 (×2): qty 2

## 2018-03-21 MED ORDER — GUAIFENESIN ER 600 MG PO TB12
1200.0000 mg | ORAL_TABLET | Freq: Two times a day (BID) | ORAL | Status: DC
  Administered 2018-03-21 – 2018-03-24 (×7): 1200 mg via ORAL
  Filled 2018-03-21 (×7): qty 2

## 2018-03-21 MED ORDER — IOHEXOL 350 MG/ML SOLN
100.0000 mL | Freq: Once | INTRAVENOUS | Status: AC | PRN
  Administered 2018-03-21: 100 mL via INTRAVENOUS

## 2018-03-21 NOTE — Progress Notes (Signed)
Patient still getting extremely short of breath going to bathroom. BNP is 129 so probably suggestive of slight need for diuretic with under lying heart failure. FiO2 still at 100 percent. With oxygen saturation falling quickly.

## 2018-03-21 NOTE — Progress Notes (Signed)
Patient asleep sitting up in bed down to 60 percent FiO2 -saturation 94  , f 16

## 2018-03-21 NOTE — Progress Notes (Addendum)
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  She stated that she had a similar presentation in August last year and work-up did not show any COPD or asthma or CHF  Subjective: Patient seen and examined at bedside.  Patient is lying in bed with BiPAP on patient was noted to be desaturating on her oxygen with high flow oxygen and had to be put back on BiPAP this morning she continues to require BiPAP with high oxygen  Subjective: March 21, 2018: Seen at bedside she is complaining of still wheezing quite a lot with shortness of breath she was not on the BiPAP.  When I saw her she is sitting in bed.  She is requiring still a lot of oxygen 100%.  Assessment/Plan: Active Problems:   Morbid obesity ([place])   Tobacco abuse   Bronchospasm, acute   Acute respiratory failure with hypoxia ([place])   Uncontrolled type 2 diabetes mellitus with hyperglycemia ([place])   Acute respiratory failure with hypoxia, most likely due to bronchospasm from viral infection she is positive for the rhinovirus she has not traveled outside the country or to [place].  We will continue BiPAP.  Consulted with pulmonary, who came by to see her this morning.  CT angiogram was ordered today.  Addendum: CT angiogram of March 21, 2018, was negative for pulmonary embolism  Diabetes mellitus uncontrolled her blood sugars is in the 300s we will continue sliding scale  Reactive airway disease with white count elevated and 13.9 continue levofloxacin  Morbid obesity BMI 45.6 lifestyle modification will be very beneficial  Code Status: Full  Severity of Illness: The appropriate patient status for this patient is INPATIENT. Inpatient  status is judged to be reasonable and necessary in order to provide the required intensity of service to ensure the patient's safety. The patient's presenting symptoms, physical exam findings, and initial radiographic and laboratory data in the context of their chronic comorbidities is felt to place them at high risk for further clinical deterioration. Furthermore, it is not anticipated that the patient will be medically stable for discharge from the hospital within 2 midnights of admission. The following factors support the patient status of inpatient.   " Patient continues to be in respiratory failure requiring oxygen  BiPAP * I certify that at the point of admission it is my clinical judgment that the patient will require inpatient hospital care spanning beyond 2 midnights from the point of admission due to high intensity of service, high risk for further deterioration and high frequency of surveillance required.*    Family Communication: Husband James at bedside  Disposition Plan: Home when stable   Consultants:  Pulmonary  Procedures:  None  Antimicrobials:  Levofloxacin  DVT prophylaxis: Lovenox   Objective: Vitals:   03/21/18 0900 03/21/18 1000 03/21/18 1100 03/21/18 1112  BP: 108/73     Pulse: 68 68 (!) 55   Resp: (!) 28 19 (!) 24   Temp:    97.8 F (36.6 C)  TempSrc:    Oral  SpO2: 92% 92% 93%   Weight:      Height:        Intake/Output Summary (Last 24 hours) at 03/21/2018 1337 Last data filed at 03/21/2018 0925 Gross per  24 hour  Intake 120 ml  Output --  Net 120 ml   Filed Weights   03/18/18 1117 03/20/18 0500 03/21/18 0600  Weight: 136.1 kg 134.6 kg 134.1 kg   Body mass index is 44.95 kg/m.  Exam:   General: 33 y.o. year-old female well developed well nourished in no acute distress.  Alert and oriented x3.  Slightly obese BiPAP in use lying quietly in bed looking slightly anxious  Cardiovascular: Regular rate and rhythm with no rubs or gallops.   No thyromegaly or JVD noted.    Respiratory: Bilateral rhonchi and wheezes and rales worsened today with coughing. Good inspiratory effort.  Abdomen: Soft nontender nondistended with normal bowel sounds x4 quadrants.  Musculoskeletal: No lower extremity edema. 2/4 pulses in all 4 extremities.  Skin: No ulcerative lesions noted or rashes,  Psychiatry: Mood is appropriate for condition and setting likely anxious    Data Reviewed: CBC: Recent Labs  Lab 03/18/18 1150 03/19/18 0436 03/21/18 0503  WBC 8.2 13.9* 16.7*  NEUTROABS 5.5  --   --   HGB 14.4 13.8 13.7  HCT 44.2 42.7 42.7  MCV 88.6 89.9 91.0  PLT 238 238 308   Basic Metabolic Panel: Recent Labs  Lab 03/18/18 1150 03/19/18 0436 03/21/18 0503  NA 136 135 135  K 3.6 4.2 4.1  CL 107 108 101  CO2 18* 20* 25  GLUCOSE 255* 276* 278*  BUN 8 11 21*  CREATININE 0.57 0.59 0.58  CALCIUM 8.7* 8.7* 8.4*   GFR: Estimated Creatinine Clearance: 143.9 mL/min (by C-G formula based on SCr of 0.58 mg/dL). Liver Function Tests: No results for input(s): AST, ALT, ALKPHOS, BILITOT, PROT, ALBUMIN in the last 168 hours. No results for input(s): LIPASE, AMYLASE in the last 168 hours. No results for input(s): AMMONIA in the last 168 hours. Coagulation Profile: No results for input(s): INR, PROTIME in the last 168 hours. Cardiac Enzymes: No results for input(s): CKTOTAL, CKMB, CKMBINDEX, TROPONINI in the last 168 hours. BNP (last 3 results) No results for input(s): PROBNP in the last 8760 hours. HbA1C: Recent Labs    03/18/18 1552  HGBA1C 8.9*   CBG: Recent Labs  Lab 03/20/18 1144 03/20/18 1555 03/20/18 2145 03/21/18 0739 03/21/18 1143  GLUCAP 349* 242* 337* 250* 330*   Lipid Profile: No results for input(s): CHOL, HDL, LDLCALC, TRIG, CHOLHDL, LDLDIRECT in the last 72 hours. Thyroid Function Tests: No results for input(s): TSH, T4TOTAL, FREET4, T3FREE, THYROIDAB in the last 72 hours. Anemia Panel: No results for  input(s): VITAMINB12, FOLATE, FERRITIN, TIBC, IRON, RETICCTPCT in the last 72 hours. Urine analysis:    Component Value Date/Time   COLORURINE YELLOW 08/17/2012 [place] 08/17/2012 0339   LABSPEC 1.020 08/17/2012 0339   PHURINE 5.5 08/17/2012 0339   GLUCOSEU >1000 (A) 08/17/2012 0339   HGBUR TRACE (A) 08/17/2012 0339   BILIRUBINUR NEGATIVE 08/17/2012 0339   KETONESUR NEGATIVE 08/17/2012 0339   PROTEINUR NEGATIVE 08/17/2012 0339   UROBILINOGEN 0.2 08/17/2012 0339   NITRITE NEGATIVE 08/17/2012 0339   LEUKOCYTESUR NEGATIVE 08/17/2012 0339   Sepsis Labs: @LABRCNTIP (procalcitonin:4,lacticidven:4)  ) Recent Results (from the past 240 hour(s))  Blood culture (routine x 2)     Status: None (Preliminary result)   Collection Time: 03/18/18 11:50 AM  Result Value Ref Range Status   Specimen Description BLOOD RIGHT HAND DRAWN BY RN  Final   Special Requests   Final    BOTTLES DRAWN AEROBIC AND ANAEROBIC Blood Culture results may not  be optimal due to an inadequate volume of blood received in culture bottles   Culture   Final    NO GROWTH 2 DAYS Performed at [facility], [address], [place], [place] [address]    Report Status PENDING  Incomplete  Blood culture (routine x 2)     Status: None (Preliminary result)   Collection Time: 03/18/18 11:50 AM  Result Value Ref Range Status   Specimen Description BLOOD RIGHT ARM DRAWN BY RN  Final   Special Requests   Final    BOTTLES DRAWN AEROBIC AND ANAEROBIC Blood Culture adequate volume   Culture   Final    NO GROWTH 2 DAYS Performed at [facility], [address] [place], [place] [address]    Report Status PENDING  Incomplete  Respiratory Panel by PCR     Status: Abnormal   Collection Time: 03/18/18  3:27 PM  Result Value Ref Range Status   Adenovirus NOT DETECTED NOT DETECTED Final   Coronavirus 229E NOT DETECTED NOT DETECTED Final    Comment: (NOTE) The Coronavirus on the Respiratory Panel, DOES NOT test for the  novel  Coronavirus (2019 nCoV)    Coronavirus HKU1 NOT DETECTED NOT DETECTED Final   Coronavirus NL63 NOT DETECTED NOT DETECTED Final   Coronavirus OC43 NOT DETECTED NOT DETECTED Final   Metapneumovirus NOT DETECTED NOT DETECTED Final   Rhinovirus / Enterovirus DETECTED (A) NOT DETECTED Final   Influenza A NOT DETECTED NOT DETECTED Final   Influenza B NOT DETECTED NOT DETECTED Final   Parainfluenza Virus 1 NOT DETECTED NOT DETECTED Final   Parainfluenza Virus 2 NOT DETECTED NOT DETECTED Final   Parainfluenza Virus 3 NOT DETECTED NOT DETECTED Final   Parainfluenza Virus 4 NOT DETECTED NOT DETECTED Final   Respiratory Syncytial Virus NOT DETECTED NOT DETECTED Final   Bordetella pertussis NOT DETECTED NOT DETECTED Final   Chlamydophila pneumoniae NOT DETECTED NOT DETECTED Final   Mycoplasma pneumoniae NOT DETECTED NOT DETECTED Final    Comment: Performed at [facility], [address], [place], [place] [address]  MRSA PCR Screening     Status: None   Collection Time: 03/18/18  3:27 PM  Result Value Ref Range Status   MRSA by PCR NEGATIVE NEGATIVE Final    Comment:        The GeneXpert MRSA Assay (FDA approved for NASAL specimens only), is one component of a comprehensive MRSA colonization surveillance program. It is not intended to diagnose MRSA infection nor to guide or monitor treatment for MRSA infections. Performed at [facility], [address], [place], [place] [address]       Studies: Dg Chest 1 View  Result Date: 03/20/2018 CLINICAL DATA:  Cough, chest congestion, shortness of breath and generalized weakness since 03/14/2018. Ex-smoker. History of asthma. EXAM: CHEST  1 VIEW COMPARISON:  03/18/2018. FINDINGS: Poor inspiration with some improvement. Mildly progressive bibasilar linear scarring and possible atelectasis. Otherwise, clear lungs. No pleural fluid. Grossly normal sized heart, magnified by the poor inspiration and portable AP technique. Unremarkable  bones. IMPRESSION: Poor inspiration with mildly progressive bibasilar linear scarring and possible atelectasis. Electronically Signed   By: Claudie Revering M.D.   On: 03/20/2018 21:00   Ct Angio Chest Pe W Or Wo Contrast  Result Date: 03/21/2018 CLINICAL DATA:  Cough, chest congestion, weakness and fever beginning 1 week ago. Poor oxygen saturation. EXAM: CT ANGIOGRAPHY CHEST WITH CONTRAST TECHNIQUE: Multidetector CT imaging of the chest was performed using the standard protocol  during bolus administration of intravenous contrast. Multiplanar CT image reconstructions and MIPs were obtained to evaluate the vascular anatomy. CONTRAST:  199m OMNIPAQUE IOHEXOL 350 MG/ML SOLN COMPARISON:  Chest radiography yesterday.  Chest CT 08/26/2017 FINDINGS: Cardiovascular: Pulmonary arterial opacification is moderate to good. There are no visible pulmonary emboli. No aortic atherosclerosis. No coronary artery calcification. Heart size is normal. Mediastinum/Nodes: No mediastinal mass or lymphadenopathy. Lungs/Pleura: There is bilateral atelectatic pneumonia, lower lobe predominant. No evidence of ground-glass opacity to specifically suggest viral pneumonitis. No evidence of underlying mass lesion. No pleural effusion. Upper Abdomen: Normal.  Previous cholecystectomy. Musculoskeletal: Normal Review of the MIP images confirms the above findings. IMPRESSION: No pulmonary emboli. Bilateral patchy mid to lower lung atelectasis/infiltrate consistent with atelectatic pneumonia. No finding specific for viral pneumonitis. Electronically Signed   By: MNelson ChimesM.D.   On: 03/21/2018 10:46    Scheduled Meds:  allopurinol  300 mg Oral QPM   budesonide (PULMICORT) nebulizer solution  0.5 mg Nebulization BID   calcium carbonate  1 tablet Oral BID WC   And   cholecalciferol  1,000 Units Oral BID WC   chlorhexidine  15 mL Mouth Rinse BID   Chlorhexidine Gluconate Cloth  6 each Topical Daily   enoxaparin (LOVENOX) injection   70 mg Subcutaneous Q24H   fluticasone  2 spray Each Nare QPM   guaiFENesin  1,200 mg Oral BID   insulin aspart  0-20 Units Subcutaneous TID WC   insulin aspart  0-5 Units Subcutaneous QHS   insulin aspart  6 Units Subcutaneous TID WC   insulin glargine  60 Units Subcutaneous QHS   ipratropium  0.5 mg Nebulization Q6H   levalbuterol  0.63 mg Nebulization Q6H   levofloxacin  500 mg Oral Daily   loratadine  10 mg Oral Daily   mouth rinse  15 mL Mouth Rinse q12n4p   methylPREDNISolone (SOLU-MEDROL) injection  60 mg Intravenous Q6H   pantoprazole  40 mg Oral Daily    Continuous Infusions:  sodium chloride Stopped (03/18/18 1506)   albuterol 15 mg/hr (03/18/18 1151)     LOS: 3 days     NCristal Deer MD [facility]  To reach me or the doctor on call, go to: [email or web address] Password [email or web address] 03/21/2018, 1:37 PM

## 2018-03-21 NOTE — Progress Notes (Signed)
Patient lying flat on side, left side down. No wheezing noted, asleep Saturation 100 , decreasing oxygen to 70, aerogen inline given. Still not sure why so much oxygen is required. Treating about every 2 to 3 hours for coverage of reactive airway problem. Heart rate 63-- saturation 97 on 70

## 2018-03-21 NOTE — Consult Note (Addendum)
Consult requested by: [facility] hospital[facility], Dr. 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  She said she got sick on March 8.  She had temperature as high as 101 at home.  She had cough and congestion and weakness.  She went to see her primary care provider Dr. Nevada Crane on 03/15/2018 and was started on Augmentin and doxycycline but developed a rash presumably from doxycycline.  She went back to her primary care doctor on the 12th and was given a prescription for levofloxacin but she was having more trouble and was sent to the emergency department where she eventually was admitted.  Her husband has been sick and had been in the hospital on March 6 for COPD exacerbation.  Neither has any travel outside [place] in the last month but she does have exposure to multiple issues at the emergency department.  Chest x-ray done yesterday evening that I personally reviewed shows a fairly large area of atelectasis in the right lower lobe area and this could of course represent pneumonia.  She is not having any chest pain has not had any hemoptysis but has been coughing up some brown sputum she says her chest feels tight.  Chest x-ray not really suggestive of pulmonary edema and her BNP is relatively low.  She does have family history of asthma in her brother and of COPD and lung cancer and other family members but no personal history of lung disease herself.  Her past medical history shows asthma but she says that is not correct  Past Medical History:  Diagnosis Date  . Acid reflux   . Asthma   . Clostridium difficile infection    in the past  . Diabetes mellitus   . Fatty liver   . Fibrocystic breast disease   . Gout   . Hidradenitis   . History of kidney stones   . Hyperlipemia   . Obesity   . Polycystic ovary syndrome   . PONV (postoperative  nausea and vomiting)   . Smoker      Family History  Problem Relation Age of Onset  . Stroke Other        paternal great grandfather  . Diabetes Father   . Hypertension Father   . Cancer Maternal Grandfather        adenocarcinoma  . Emphysema Maternal Grandfather        smoked  . Heart attack Paternal Grandfather   . Hypertension Paternal Grandmother   . Diabetes Paternal Grandmother   . Gout Paternal Grandmother   . Hypertension Maternal Grandmother   . Other Mother        pre-cancerous cells; had hyst  . Emphysema Mother        smoker  . Gout Brother   . Cancer Sister        cervical; had hyst  . Asthma Maternal Uncle   . Colon cancer Neg Hx   . Liver disease Neg Hx   . Liver cancer Neg Hx      Social History   Socioeconomic History  . Marital status: Married    Spouse name: Not on file  . Number of children: Not on file  . Years of education: Not on file  . Highest education level: Not on file  Occupational History  . Occupation: nurse  Social Needs  . Financial resource strain: Not hard at  all  . Food insecurity:    Worry: Never true    Inability: Never true  . Transportation needs:    Medical: No    Non-medical: No  Tobacco Use  . Smoking status: Former Smoker    Packs/day: 1.00    Years: 10.00    Pack years: 10.00    Types: Cigarettes    Last attempt to quit: 08/23/2017    Years since quitting: 0.5  . Smokeless tobacco: Never Used  Substance and Sexual Activity  . Alcohol use: Yes    Comment: occas  . Drug use: No  . Sexual activity: Yes    Birth control/protection: None  Lifestyle  . Physical activity:    Days per week: Not on file    Minutes per session: Not on file  . Stress: Not on file  Relationships  . Social connections:    Talks on phone: More than three times a week    Gets together: Once a week    Attends religious service: More than 4 times per year    Active member of club or organization: No    Attends meetings of clubs or  organizations: Never    Relationship status: Married  Other Topics Concern  . Not on file  Social History Narrative   Is a nurse here at [facility] ED     ROS: Except as mentioned 10 point review of systems is negative    Objective: Vital signs in last 24 hours: Temp:  [97.2 F (36.2 C)-98 F (36.7 C)] 97.3 F (36.3 C) (03/15 0740) Pulse Rate:  [54-86] 57 (03/15 0756) Resp:  [16-30] 29 (03/15 0756) BP: (94-133)/(65-95) 127/81 (03/15 0756) SpO2:  [89 %-100 %] 95 % (03/15 0756) FiO2 (%):  [60 %-100 %] 60 % (03/15 0756) Weight:  [134.1 kg] 134.1 kg (03/15 0600) Weight change: -0.5 kg Last BM Date: 03/19/18  Intake/Output from previous day: 03/14 0701 - 03/15 0700 In: 240 [P.O.:240] Out: -   PHYSICAL EXAM Constitutional: She is awake and alert and appears to be in some respiratory distress.  Eyes: Pupils react EOMI.  Ears nose mouth and throat: Mucous membranes are moist hearing is grossly normal throat is clear.  Cardiovascular: Her heart is regular with normal heart sounds.  Respiratory: Respiratory effort is increased she has bilateral wheezes and she sounds tight gastrointestinal: Her abdomen is soft with no masses.  Skin: Warm and dry musculoskeletal: Grossly normal strength.  Neurological: No focal abnormalities.  Psychiatric: She is anxious  Lab Results: Basic Metabolic Panel: Recent Labs    03/19/18 0436 03/21/18 0503  NA 135 135  K 4.2 4.1  CL 108 101  CO2 20* 25  GLUCOSE 276* 278*  BUN 11 21*  CREATININE 0.59 0.58  CALCIUM 8.7* 8.4*   Liver Function Tests: No results for input(s): AST, ALT, ALKPHOS, BILITOT, PROT, ALBUMIN in the last 72 hours. No results for input(s): LIPASE, AMYLASE in the last 72 hours. No results for input(s): AMMONIA in the last 72 hours. CBC: Recent Labs    03/18/18 1150 03/19/18 0436 03/21/18 0503  WBC 8.2 13.9* 16.7*  NEUTROABS 5.5  --   --   HGB 14.4 13.8 13.7  HCT 44.2 42.7 42.7  MCV 88.6 89.9 91.0  PLT 238 238 256   Cardiac  Enzymes: No results for input(s): CKTOTAL, CKMB, CKMBINDEX, TROPONINI in the last 72 hours. BNP: No results for input(s): PROBNP in the last 72 hours. D-Dimer: No results for input(s): DDIMER in the  last 72 hours. CBG: Recent Labs    03/19/18 2111 03/20/18 0734 03/20/18 1144 03/20/18 1555 03/20/18 2145 03/21/18 0739  GLUCAP 345* 269* 349* 242* 337* 250*   Hemoglobin A1C: Recent Labs    03/18/18 1552  HGBA1C 8.9*   Fasting Lipid Panel: No results for input(s): CHOL, HDL, LDLCALC, TRIG, CHOLHDL, LDLDIRECT in the last 72 hours. Thyroid Function Tests: No results for input(s): TSH, T4TOTAL, FREET4, T3FREE, THYROIDAB in the last 72 hours. Anemia Panel: No results for input(s): VITAMINB12, FOLATE, FERRITIN, TIBC, IRON, RETICCTPCT in the last 72 hours. Coagulation: No results for input(s): LABPROT, INR in the last 72 hours. Urine Drug Screen: Drugs of Abuse  No results found for: LABOPIA, COCAINSCRNUR, LABBENZ, AMPHETMU, THCU, LABBARB  Alcohol Level: No results for input(s): ETH in the last 72 hours. Urinalysis: No results for input(s): COLORURINE, LABSPEC, PHURINE, GLUCOSEU, HGBUR, BILIRUBINUR, KETONESUR, PROTEINUR, UROBILINOGEN, NITRITE, LEUKOCYTESUR in the last 72 hours.  Invalid input(s): APPERANCEUR Misc. Labs:   ABGS: Recent Labs    03/20/18 2120  PHART 7.485*  PO2ART 91.8  HCO3 25.8     MICROBIOLOGY: Recent Results (from the past 240 hour(s))  Blood culture (routine x 2)     Status: None (Preliminary result)   Collection Time: 03/18/18 11:50 AM  Result Value Ref Range Status   Specimen Description BLOOD RIGHT HAND DRAWN BY RN  Final   Special Requests   Final    BOTTLES DRAWN AEROBIC AND ANAEROBIC Blood Culture results may not be optimal due to an inadequate volume of blood received in culture bottles   Culture   Final    NO GROWTH 2 DAYS Performed at [facility], [address] [place], [place] [address]    Report Status PENDING  Incomplete  Blood  culture (routine x 2)     Status: None (Preliminary result)   Collection Time: 03/18/18 11:50 AM  Result Value Ref Range Status   Specimen Description BLOOD RIGHT ARM DRAWN BY RN  Final   Special Requests   Final    BOTTLES DRAWN AEROBIC AND ANAEROBIC Blood Culture adequate volume   Culture   Final    NO GROWTH 2 DAYS Performed at [facility], [address], [place], [place] [address]    Report Status PENDING  Incomplete  Respiratory Panel by PCR     Status: Abnormal   Collection Time: 03/18/18  3:27 PM  Result Value Ref Range Status   Adenovirus NOT DETECTED NOT DETECTED Final   Coronavirus 229E NOT DETECTED NOT DETECTED Final    Comment: (NOTE) The Coronavirus on the Respiratory Panel, DOES NOT test for the novel  Coronavirus (2019 nCoV)    Coronavirus HKU1 NOT DETECTED NOT DETECTED Final   Coronavirus NL63 NOT DETECTED NOT DETECTED Final   Coronavirus OC43 NOT DETECTED NOT DETECTED Final   Metapneumovirus NOT DETECTED NOT DETECTED Final   Rhinovirus / Enterovirus DETECTED (A) NOT DETECTED Final   Influenza A NOT DETECTED NOT DETECTED Final   Influenza B NOT DETECTED NOT DETECTED Final   Parainfluenza Virus 1 NOT DETECTED NOT DETECTED Final   Parainfluenza Virus 2 NOT DETECTED NOT DETECTED Final   Parainfluenza Virus 3 NOT DETECTED NOT DETECTED Final   Parainfluenza Virus 4 NOT DETECTED NOT DETECTED Final   Respiratory Syncytial Virus NOT DETECTED NOT DETECTED Final   Bordetella pertussis NOT DETECTED NOT DETECTED Final   Chlamydophila pneumoniae NOT DETECTED NOT DETECTED Final   Mycoplasma pneumoniae NOT DETECTED NOT DETECTED Final    Comment:  Performed at [facility], [place] [address] [place], [place] [address]  MRSA PCR Screening     Status: None   Collection Time: 03/18/18  3:27 PM  Result Value Ref Range Status   MRSA by PCR NEGATIVE NEGATIVE Final    Comment:        The GeneXpert MRSA Assay (FDA approved for NASAL specimens only), is one component of  a comprehensive MRSA colonization surveillance program. It is not intended to diagnose MRSA infection nor to guide or monitor treatment for MRSA infections. Performed at [facility], [address], [place], [place] [address]     Studies/Results: Dg Chest 1 View  Result Date: 03/20/2018 CLINICAL DATA:  Cough, chest congestion, shortness of breath and generalized weakness since 03/14/2018. Ex-smoker. History of asthma. EXAM: CHEST  1 VIEW COMPARISON:  03/18/2018. FINDINGS: Poor inspiration with some improvement. Mildly progressive bibasilar linear scarring and possible atelectasis. Otherwise, clear lungs. No pleural fluid. Grossly normal sized heart, magnified by the poor inspiration and portable AP technique. Unremarkable bones. IMPRESSION: Poor inspiration with mildly progressive bibasilar linear scarring and possible atelectasis. Electronically Signed   By: Claudie Revering M.D.   On: 03/20/2018 21:00    Medications:  Prior to Admission:  Medications Prior to Admission  Medication Sig Dispense Refill Last Dose  . acetaminophen (TYLENOL) 500 MG tablet Take 1,000 mg by mouth every 6 (six) hours as needed.   03/18/2018  . albuterol (PROVENTIL HFA;VENTOLIN HFA) 108 (90 Base) MCG/ACT inhaler Inhale 2 puffs into the lungs every 6 (six) hours as needed for wheezing or shortness of breath. 1 Inhaler 2 03/10/2018  . albuterol (PROVENTIL) (5 MG/ML) 0.5% nebulizer solution Take 0.5 mLs (2.5 mg total) by nebulization every 6 (six) hours as needed for wheezing or shortness of breath. 20 mL 0 03/17/2018  . allopurinol (ZYLOPRIM) 300 MG tablet Take 300 mg by mouth every evening.    03/17/2018  . butalbital-acetaminophen-caffeine (FIORICET, ESGIC) 50-325-40 MG tablet Take 1 tablet by mouth once a week.    Past Month at Unknown time  . Calcium Carb-Cholecalciferol [phone] MG-UNIT CAPS Take 2 capsules by mouth every evening.   03/17/2018  . calcium carbonate (TUMS CHEWY BITES) 750 MG chewable tablet Chew 1 tablet  by mouth as needed for heartburn.   unknown  . fexofenadine (ALLEGRA) 180 MG tablet Take 180 mg by mouth every evening.   03/17/2018  . fluticasone (FLONASE) 50 MCG/ACT nasal spray Place 2 sprays into both nostrils daily.    03/17/2018  . hydrochlorothiazide (MICROZIDE) 12.5 MG capsule Take 1 capsule (12.5 mg total) by mouth daily. 30 capsule 3 03/17/2018  . ibuprofen (ADVIL,MOTRIN) 800 MG tablet Take 800 mg by mouth every 8 (eight) hours as needed.   03/18/2018  . Insulin Glargine-Lixisenatide (SOLIQUA) 100-33 UNT-MCG/ML SOPN Inject 60 Units into the skin at bedtime.    03/17/2018  . insulin lispro (HUMALOG) 100 UNIT/ML injection Inject 10-22 Units into the skin 4 (four) times daily. Sliding scale 4 times a day   03/17/2018  . ipratropium (ATROVENT) 0.02 % nebulizer solution Take 0.5 mg by nebulization every 4 (four) hours as needed for wheezing or shortness of breath.   03/17/2018  . KOMBIGLYZE XR 2.05-998 MG TB24 Take 1 tablet by mouth daily.   11 03/17/2018  . levofloxacin (LEVAQUIN) 750 MG tablet Take 750 mg by mouth daily.    03/17/2018  . pantoprazole (PROTONIX) 40 MG tablet Take 1 tablet (40 mg total) by mouth daily. 30 tablet 2  03/17/2018   Scheduled: . allopurinol  300 mg Oral QPM  . budesonide (PULMICORT) nebulizer solution  0.5 mg Nebulization BID  . calcium carbonate  1 tablet Oral BID WC   And  . cholecalciferol  1,000 Units Oral BID WC  . chlorhexidine  15 mL Mouth Rinse BID  . Chlorhexidine Gluconate Cloth  6 each Topical Daily  . dextromethorphan-guaiFENesin  1 tablet Oral BID  . enoxaparin (LOVENOX) injection  70 mg Subcutaneous Q24H  . fluticasone  2 spray Each Nare QPM  . insulin aspart  0-20 Units Subcutaneous TID WC  . insulin aspart  0-5 Units Subcutaneous QHS  . insulin aspart  6 Units Subcutaneous TID WC  . insulin glargine  60 Units Subcutaneous QHS  . ipratropium  0.5 mg Nebulization Q6H  . levalbuterol  0.63 mg Nebulization Q6H  . levofloxacin  500 mg Oral Daily  .  loratadine  10 mg Oral Daily  . mouth rinse  15 mL Mouth Rinse q12n4p  . methylPREDNISolone (SOLU-MEDROL) injection  60 mg Intravenous Q6H  . pantoprazole  40 mg Oral Daily   Continuous: . sodium chloride Stopped (03/18/18 1506)  . albuterol 15 mg/hr (03/18/18 1151)   EUM:PNTIRW chloride, acetaminophen **OR** acetaminophen, ibuprofen, levalbuterol, ondansetron **OR** ondansetron (ZOFRAN) IV  Assesment: She has acute hypoxic respiratory failure.  She seems to have acute bronchitis and may have pneumonia.  She has had increasing oxygen requirements and required BiPAP last night.  Chest x-ray shows atelectasis but I am concerned that she has pneumonia.  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  She would be low priority for testing I think.  Considering her obesity and tobacco abuse I think we need to go ahead and get CT angiogram.  Not only will it help [name] with the possible diagnosis of pulmonary embolism but we can get a much better look at the lung parenchyma and see if she has pneumonia.  If she does show pneumonia on chest x-ray I would plan to go ahead and treat her for healthcare associated pneumonia.  She has smoking history and although she is young she may have some element of COPD.  She is critically ill from a respiratory standpoint and  high risk of requiring intubation and mechanical ventilation  She has diabetes Active Problems:   Morbid obesity ([place])   Tobacco abuse   Bronchospasm, acute   Acute respiratory failure with hypoxia ([place])   Uncontrolled type 2 diabetes mellitus with hyperglycemia ([place])    Plan: Continue antibiotics, steroids, inhaled bronchodilators.  Check CT angiogram.  Add Mucinex and flutter valve to see if we can break up some of her congestion  Thanks for allowing me to see her with you    LOS: 3 days   Alonza Bogus 03/21/2018, 8:57 AM

## 2018-03-22 LAB — GLUCOSE, CAPILLARY
Glucose-Capillary: 373 mg/dL — ABNORMAL HIGH (ref 70–99)
Glucose-Capillary: 340 mg/dL — ABNORMAL HIGH (ref 70–99)
Glucose-Capillary: 219 mg/dL — ABNORMAL HIGH (ref 70–99)
Glucose-Capillary: 383 mg/dL — ABNORMAL HIGH (ref 70–99)

## 2018-03-22 MED ORDER — INSULIN GLARGINE 100 UNIT/ML ~~LOC~~ SOLN
70.0000 [IU] | Freq: Every day | SUBCUTANEOUS | Status: DC
  Administered 2018-03-22 – 2018-03-23 (×2): 70 [IU] via SUBCUTANEOUS
  Filled 2018-03-22 (×3): qty 0.7

## 2018-03-22 MED ORDER — INSULIN ASPART 100 UNIT/ML ~~LOC~~ SOLN
15.0000 [IU] | Freq: Three times a day (TID) | SUBCUTANEOUS | Status: DC
  Administered 2018-03-22 – 2018-03-24 (×7): 15 [IU] via SUBCUTANEOUS

## 2018-03-22 MED ORDER — ARFORMOTEROL TARTRATE 15 MCG/2ML IN NEBU
15.0000 ug | INHALATION_SOLUTION | Freq: Two times a day (BID) | RESPIRATORY_TRACT | Status: DC
  Administered 2018-03-22 – 2018-03-24 (×4): 15 ug via RESPIRATORY_TRACT
  Filled 2018-03-22 (×4): qty 2

## 2018-03-22 MED ORDER — VANCOMYCIN HCL 1.25 G IV SOLR
1250.0000 mg | Freq: Three times a day (TID) | INTRAVENOUS | Status: DC
  Administered 2018-03-22: 1250 mg via INTRAVENOUS
  Filled 2018-03-22 (×3): qty 1250

## 2018-03-22 MED ORDER — VANCOMYCIN HCL IN DEXTROSE 1-5 GM/200ML-% IV SOLN
1000.0000 mg | INTRAVENOUS | Status: AC
  Administered 2018-03-22 (×2): 1000 mg via INTRAVENOUS
  Filled 2018-03-22: qty 200

## 2018-03-22 MED ORDER — SODIUM CHLORIDE 0.9 % IV SOLN
2.0000 g | Freq: Three times a day (TID) | INTRAVENOUS | Status: DC
  Administered 2018-03-22 – 2018-03-24 (×7): 2 g via INTRAVENOUS
  Filled 2018-03-22 (×7): qty 2

## 2018-03-22 MED ORDER — PIPERACILLIN-TAZOBACTAM 3.375 G IVPB
3.3750 g | Freq: Three times a day (TID) | INTRAVENOUS | Status: DC
  Administered 2018-03-22: 3.375 g via INTRAVENOUS
  Filled 2018-03-22: qty 50

## 2018-03-22 NOTE — Progress Notes (Signed)
PROGRESS NOTE  Cheryl Black NGE:[id number] DOB: September 21, 1985 DOA: 03/18/2018 PCP: [name], MD   Brief History:  33 y.o.femalewith medical history ofdiabetes mellitus type 2, PCOS, hyperlipidemia, obesity, tobacco abuse presenting with cough, congestion, and generalized weakness that began on 03/14/2018. The patient had been running fevers up to 101.0 F at home. She went to see her primary care provider on 03/15/2018 and was started on Augmentin and doxycycline. The patient had taken 2 doses of Augmentin. However, when she took the doxycycline, she developed a rash on her hands. She stated that her breathing and coughing and chest congestion did not improve. She went back to see her primary care provider on 03/18/2018. The patient was given a prescription for levofloxacin, but her breathing was worsening. As result, the patient was sent to emergency department for further evaluation. The patient denies any recent travels outside of [place]. However, she works as an Therapist, sports in the emergency department. Her last shift at work was on 03/10/2018. The patient lives at home with her spouse who was recently discharged from the hospital on 03/12/2018 after a stay for treatment for COPD exacerbation. He also smokes, but he has not traveled outside [place] in the past month. The patient has not had any sick close contacts. Patient has had some chest tightness with her coughing but she denies any nausea, vomiting, diarrhea, abdominal pain, headache, sore throat. There is no abdominal pain, dysuria, hematuria. She has been using her nebulizer machine at home without much improvement. In the emergency department, the patient developed some respiratory distress. She was placed on BiPAP. She was given bronchodilators and the Brethine. She was started on Solu-Medrol and levofloxacin. The patient was admitted for further evaluation and treatment.  Assessment/Plan: Acute Respiratory  Failure with Hypoxia -due to bronchospasm, likely due to underlying viral infection -pt does not have diagnosis of asthma or COPD -Patient had seen pulmonology, Dr. Melvyn Novas on 09/24/2016--PFTs did not suggest COPD or asthma -?reactive airway disease -stable on BiPAP>>>weaned to 10 L HFNC -PCT <0.10 -3/15 CTA chest--no PE, no GOO, bibasilar atelectatic PNA -cefepime per pulm -d/c vancomycin--MRSA screening neg  Acute Bronchospasm/reactive airway disease -personally reviewed CXR--interstitial coarsening without  consolidation -Viral respiratory panel-->+rhinovirus/enterovirus -Influenza rapid antigen test was negative in the office -Continue Pulmicort -Continue Xopenex -Continue Solu-Medrol -Continue loratadine -add Brovana  Diabetes mellitus type 2, uncontrolled with hyperglycemia -Hemoglobin A1c--8.9 -Increase Lantus 70 units daily -Resistant NovoLog sliding scale -Holding Kombiglyze -increase novolog 15 units with meals  Morbid obesity -BMI 45.61 -Lifestyle modification  PCOS -Patient follows with Dr. [name] -No longer on Provera  Tobacco Abuse -I have discussed tobacco cessation with the patient.  I have counseled the patient regarding the negative impacts of continued tobacco use including but not limited to lung cancer, COPD, and cardiovascular disease.  I have discussed alternatives to tobacco and modalities that may help facilitate tobacco cessation including but not limited to biofeedback, hypnosis, and medications.  Total time spent with tobacco counseling was 4 minutes.    Disposition Plan:   Home in 1-2 days  Family Communication:   No family present  Consultants:  none  Code Status:  FULL   DVT Prophylaxis:  [place] Lovenox   Procedures: As Listed in Progress Note Above  Antibiotics: Levofloxacin 3/12>>3/13     Subjective: Overall starting to breath better, but c/o dyspnea with minimal exertion.  Denies cp, f/c, abd pain, n/v/d, head  ache.  Cough is nonproductive.  Objective: Vitals:   03/22/18 1400 03/22/18 1500 03/22/18 1600 03/22/18 1711  BP: 127/87 113/74 111/81   Pulse: (!) 55 (!) 56 (!) 52   Resp: 16 19 13    Temp:    98.5 F (36.9 C)  TempSrc:    Oral  SpO2: 96% 94% 96%   Weight:      Height:        Intake/Output Summary (Last 24 hours) at 03/22/2018 1741 Last data filed at 03/22/2018 1703 Gross per 24 hour  Intake 593.41 ml  Output -  Net 593.41 ml   Weight change: 0.2 kg Exam:   General:  Pt is alert, follows commands appropriately, not in acute distress  HEENT: No icterus, No thrush, No neck mass, [place]/AT  Cardiovascular: RRR, S1/S2, no rubs, no gallops  Respiratory: bibasilar rales, no wheeze  Abdomen: Soft/+BS, non tender, non distended, no guarding  Extremities: No edema, No lymphangitis, No petechiae, No rashes, no synovitis   Data Reviewed: I have personally reviewed following labs and imaging studies Basic Metabolic Panel: Recent Labs  Lab 03/18/18 1150 03/19/18 0436 03/21/18 0503  NA 136 135 135  K 3.6 4.2 4.1  CL 107 108 101  CO2 18* 20* 25  GLUCOSE 255* 276* 278*  BUN 8 11 21*  CREATININE 0.57 0.59 0.58  CALCIUM 8.7* 8.7* 8.4*   Liver Function Tests: No results for input(s): AST, ALT, ALKPHOS, BILITOT, PROT, ALBUMIN in the last 168 hours. No results for input(s): LIPASE, AMYLASE in the last 168 hours. No results for input(s): AMMONIA in the last 168 hours. Coagulation Profile: No results for input(s): INR, PROTIME in the last 168 hours. CBC: Recent Labs  Lab 03/18/18 1150 03/19/18 0436 03/21/18 0503  WBC 8.2 13.9* 16.7*  NEUTROABS 5.5  --   --   HGB 14.4 13.8 13.7  HCT 44.2 42.7 42.7  MCV 88.6 89.9 91.0  PLT 238 238 256   Cardiac Enzymes: No results for input(s): CKTOTAL, CKMB, CKMBINDEX, TROPONINI in the last 168 hours. BNP: Invalid input(s): POCBNP CBG: Recent Labs  Lab 03/21/18 1615 03/21/18 2211 03/22/18 0817 03/22/18 1125 03/22/18 1708   GLUCAP 302* 350* 373* 340* 219*   HbA1C: No results for input(s): HGBA1C in the last 72 hours. Urine analysis:    Component Value Date/Time   COLORURINE YELLOW 08/17/2012 [place] 08/17/2012 0339   LABSPEC 1.020 08/17/2012 0339   PHURINE 5.5 08/17/2012 0339   GLUCOSEU >1000 (A) 08/17/2012 0339   HGBUR TRACE (A) 08/17/2012 0339   BILIRUBINUR NEGATIVE 08/17/2012 0339   KETONESUR NEGATIVE 08/17/2012 0339   PROTEINUR NEGATIVE 08/17/2012 0339   UROBILINOGEN 0.2 08/17/2012 0339   NITRITE NEGATIVE 08/17/2012 0339   LEUKOCYTESUR NEGATIVE 08/17/2012 0339   Sepsis Labs: @LABRCNTIP (procalcitonin:4,lacticidven:4) ) Recent Results (from the past 240 hour(s))  Blood culture (routine x 2)     Status: None (Preliminary result)   Collection Time: 03/18/18 11:50 AM  Result Value Ref Range Status   Specimen Description BLOOD RIGHT HAND DRAWN BY RN  Final   Special Requests   Final    BOTTLES DRAWN AEROBIC AND ANAEROBIC Blood Culture results may not be optimal due to an inadequate volume of blood received in culture bottles   Culture   Final    NO GROWTH 4 DAYS Performed at [facility], [address], [place], [place] [address]    Report Status PENDING  Incomplete  Blood culture (routine x 2)     Status:  None (Preliminary result)   Collection Time: 03/18/18 11:50 AM  Result Value Ref Range Status   Specimen Description BLOOD RIGHT ARM DRAWN BY RN  Final   Special Requests   Final    BOTTLES DRAWN AEROBIC AND ANAEROBIC Blood Culture adequate volume   Culture   Final    NO GROWTH 4 DAYS Performed at [facility], [address] [place], [place] [address]    Report Status PENDING  Incomplete  Respiratory Panel by PCR     Status: Abnormal   Collection Time: 03/18/18  3:27 PM  Result Value Ref Range Status   Adenovirus NOT DETECTED NOT DETECTED Final   Coronavirus 229E NOT DETECTED NOT DETECTED Final    Comment: (NOTE) The Coronavirus on the Respiratory Panel, DOES NOT  test for the novel  Coronavirus (2019 nCoV)    Coronavirus HKU1 NOT DETECTED NOT DETECTED Final   Coronavirus NL63 NOT DETECTED NOT DETECTED Final   Coronavirus OC43 NOT DETECTED NOT DETECTED Final   Metapneumovirus NOT DETECTED NOT DETECTED Final   Rhinovirus / Enterovirus DETECTED (A) NOT DETECTED Final   Influenza A NOT DETECTED NOT DETECTED Final   Influenza B NOT DETECTED NOT DETECTED Final   Parainfluenza Virus 1 NOT DETECTED NOT DETECTED Final   Parainfluenza Virus 2 NOT DETECTED NOT DETECTED Final   Parainfluenza Virus 3 NOT DETECTED NOT DETECTED Final   Parainfluenza Virus 4 NOT DETECTED NOT DETECTED Final   Respiratory Syncytial Virus NOT DETECTED NOT DETECTED Final   Bordetella pertussis NOT DETECTED NOT DETECTED Final   Chlamydophila pneumoniae NOT DETECTED NOT DETECTED Final   Mycoplasma pneumoniae NOT DETECTED NOT DETECTED Final    Comment: Performed at [facility], [address], [place], [place] [address]  MRSA PCR Screening     Status: None   Collection Time: 03/18/18  3:27 PM  Result Value Ref Range Status   MRSA by PCR NEGATIVE NEGATIVE Final    Comment:        The GeneXpert MRSA Assay (FDA approved for NASAL specimens only), is one component of a comprehensive MRSA colonization surveillance program. It is not intended to diagnose MRSA infection nor to guide or monitor treatment for MRSA infections. Performed at [facility], [address] [place], [place] [address]      Scheduled Meds: . allopurinol  300 mg Oral QPM  . budesonide (PULMICORT) nebulizer solution  0.5 mg Nebulization BID  . calcium carbonate  1 tablet Oral BID WC   And  . cholecalciferol  1,000 Units Oral BID WC  . chlorhexidine  15 mL Mouth Rinse BID  . Chlorhexidine Gluconate Cloth  6 each Topical Daily  . enoxaparin (LOVENOX) injection  70 mg Subcutaneous Q24H  . fluticasone  2 spray Each Nare QPM  . guaiFENesin  1,200 mg Oral BID  . insulin aspart  0-20 Units Subcutaneous  TID WC  . insulin aspart  0-5 Units Subcutaneous QHS  . insulin aspart  15 Units Subcutaneous TID WC  . insulin glargine  70 Units Subcutaneous QHS  . ipratropium  0.5 mg Nebulization Q6H  . levalbuterol  0.63 mg Nebulization Q6H  . loratadine  10 mg Oral Daily  . mouth rinse  15 mL Mouth Rinse q12n4p  . methylPREDNISolone (SOLU-MEDROL) injection  60 mg Intravenous Q6H  . pantoprazole  40 mg Oral Daily   Continuous Infusions: . sodium chloride 10 mL/hr at 03/22/18 1703  . albuterol 15 mg/hr (03/18/18 1151)  . ceFEPime (MAXIPIME) IV  Stopped (03/22/18 1512)  . vancomycin 166.7 mL/hr at 03/22/18 1703    Procedures/Studies: Dg Chest 1 View  Result Date: 03/20/2018 CLINICAL DATA:  Cough, chest congestion, shortness of breath and generalized weakness since 03/14/2018. Ex-smoker. History of asthma. EXAM: CHEST  1 VIEW COMPARISON:  03/18/2018. FINDINGS: Poor inspiration with some improvement. Mildly progressive bibasilar linear scarring and possible atelectasis. Otherwise, clear lungs. No pleural fluid. Grossly normal sized heart, magnified by the poor inspiration and portable AP technique. Unremarkable bones. IMPRESSION: Poor inspiration with mildly progressive bibasilar linear scarring and possible atelectasis. Electronically Signed   By: Claudie Revering M.D.   On: 03/20/2018 21:00   Ct Angio Chest Pe W Or Wo Contrast  Result Date: 03/21/2018 CLINICAL DATA:  Cough, chest congestion, weakness and fever beginning 1 week ago. Poor oxygen saturation. EXAM: CT ANGIOGRAPHY CHEST WITH CONTRAST TECHNIQUE: Multidetector CT imaging of the chest was performed using the standard protocol during bolus administration of intravenous contrast. Multiplanar CT image reconstructions and MIPs were obtained to evaluate the vascular anatomy. CONTRAST:  114m OMNIPAQUE IOHEXOL 350 MG/ML SOLN COMPARISON:  Chest radiography yesterday.  Chest CT 08/26/2017 FINDINGS: Cardiovascular: Pulmonary arterial opacification is moderate  to good. There are no visible pulmonary emboli. No aortic atherosclerosis. No coronary artery calcification. Heart size is normal. Mediastinum/Nodes: No mediastinal mass or lymphadenopathy. Lungs/Pleura: There is bilateral atelectatic pneumonia, lower lobe predominant. No evidence of ground-glass opacity to specifically suggest viral pneumonitis. No evidence of underlying mass lesion. No pleural effusion. Upper Abdomen: Normal.  Previous cholecystectomy. Musculoskeletal: Normal Review of the MIP images confirms the above findings. IMPRESSION: No pulmonary emboli. Bilateral patchy mid to lower lung atelectasis/infiltrate consistent with atelectatic pneumonia. No finding specific for viral pneumonitis. Electronically Signed   By: MNelson ChimesM.D.   On: 03/21/2018 10:46   Dg Chest Portable 1 View  Result Date: 03/18/2018 CLINICAL DATA:  Short of breath EXAM: PORTABLE CHEST 1 VIEW COMPARISON:  08/30/2017 FINDINGS: Hypoventilation. Patchy bibasilar airspace disease, with mild improvement on the right since the prior study. Negative for edema or effusion. IMPRESSION: Hypoventilation with bibasilar atelectasis. Electronically Signed   By: CFranchot GalloM.D.   On: 03/18/2018 13:29    DOrson Eva DO  [facility] Pager [phone] If 7PM-7AM, please contact night-coverage [email or web address] Password [email or web address] 03/22/2018, 5:41 PM   LOS: 4 days

## 2018-03-22 NOTE — Progress Notes (Signed)
Subjective: She says she feels a little better but is requiring 12 L high flow nasal cannula.  Her oxygenation is adequate with that.  She is still coughing nonproductively.  Cultures thus far are negative.  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  She asked me today if I thought she had coronavirus and I told her I do not think so because we do not know of any confirmed cases of coronavirus and [place] or [place].  Objective: Vital signs in last 24 hours: Temp:  [97.8 F (36.6 C)-98.6 F (37 C)] 98 F (36.7 C) (03/16 0400) Pulse Rate:  [52-75] 52 (03/16 0039) Resp:  [18-31] 31 (03/16 0039) BP: (108-148)/(73-97) 148/97 (03/16 0000) SpO2:  [89 %-98 %] 98 % (03/16 0136) FiO2 (%):  [60 %] 60 % (03/16 0136) Weight:  [134.3 kg] 134.3 kg (03/16 0500) Weight change: 0.2 kg Last BM Date: 03/19/18  Intake/Output from previous day: 03/15 0701 - 03/16 0700 In: 120 [P.O.:120] Out: -   PHYSICAL EXAM General appearance: alert, cooperative, mild distress and morbidly obese Resp: She still has rhonchi but she is not as tight as she was yesterday Cardio: regular rate and rhythm, S1, S2 normal, no murmur, click, rub or gallop GI: soft, non-tender; bowel sounds normal; no masses,  no organomegaly Extremities: extremities normal, atraumatic, no cyanosis or edema  Lab Results:  Results for orders placed or performed during the hospital encounter of 03/18/18 (from the past 48 hour(s))  Glucose, capillary     Status: Abnormal   Collection Time: 03/20/18 11:44 AM  Result Value Ref Range   Glucose-Capillary 349 (H) 70 - 99 mg/dL  Glucose, capillary     Status: Abnormal   Collection Time: 03/20/18  3:55 PM  Result Value Ref Range   Glucose-Capillary 242 (H) 70 - 99  mg/dL  Blood gas, arterial     Status: Abnormal   Collection Time: 03/20/18  9:20 PM  Result Value Ref Range   FIO2 100.00    Delivery systems BILEVEL POSITIVE AIRWAY PRESSURE    pH, Arterial 7.485 (H) 7.350 - 7.450   pCO2 arterial 34.7 32.0 - 48.0 mmHg   pO2, Arterial 91.8 83.0 - 108.0 mmHg   Bicarbonate 25.8 20.0 - 28.0 mmol/L   Acid-Base Excess 2.6 (H) 0.0 - 2.0 mmol/L   O2 Saturation 97.1 %   Patient temperature 37.0    Collection site RIGHT RADIAL    Drawn by [id number]    Sample type ARTERIAL DRAW    Allens test (pass/fail) PASS PASS    Comment: Performed at [facility], [address], [place], [place] [address]  Glucose, capillary     Status: Abnormal   Collection Time: 03/20/18  9:45 PM  Result Value Ref Range   Glucose-Capillary 337 (H) 70 - 99 mg/dL   Comment 1 Notify RN    Comment 2 Document in Chart   Brain natriuretic peptide     Status: Abnormal   Collection Time: 03/20/18  9:52 PM  Result Value Ref Range   B Natriuretic Peptide 129.0 (H) 0.0 - 100.0 pg/mL    Comment: Performed at [facility], [address], [place], [place] [address]  CBC     Status: Abnormal   Collection Time: 03/21/18  5:03 AM  Result Value Ref  Range   WBC 16.7 (H) 4.0 - 10.5 K/uL   RBC 4.69 3.87 - 5.11 MIL/uL   Hemoglobin 13.7 12.0 - 15.0 g/dL   HCT 42.7 36.0 - 46.0 %   MCV 91.0 80.0 - 100.0 fL   MCH 29.2 26.0 - 34.0 pg   MCHC 32.1 30.0 - 36.0 g/dL   RDW 13.2 11.5 - 15.5 %   Platelets 256 150 - 400 K/uL   nRBC 0.0 0.0 - 0.2 %    Comment: Performed at [facility], [address], [place], [place] [address]  Basic metabolic panel     Status: Abnormal   Collection Time: 03/21/18  5:03 AM  Result Value Ref Range   Sodium 135 135 - 145 mmol/L   Potassium 4.1 3.5 - 5.1 mmol/L   Chloride 101 98 - 111 mmol/L   CO2 25 22 - 32 mmol/L   Glucose, Bld 278 (H) 70 - 99 mg/dL   BUN 21 (H) 6 - 20 mg/dL   Creatinine, Ser 0.58 0.44 - 1.00 mg/dL   Calcium 8.4 (L) 8.9 - 10.3 mg/dL   GFR calc non Af  Amer >60 >60 mL/min   GFR calc Af Amer >60 >60 mL/min   Anion gap 9 5 - 15    Comment: Performed at [facility], [address], [place], [place] [address]  Glucose, capillary     Status: Abnormal   Collection Time: 03/21/18  7:39 AM  Result Value Ref Range   Glucose-Capillary 250 (H) 70 - 99 mg/dL  Glucose, capillary     Status: Abnormal   Collection Time: 03/21/18 11:43 AM  Result Value Ref Range   Glucose-Capillary 330 (H) 70 - 99 mg/dL  Glucose, capillary     Status: Abnormal   Collection Time: 03/21/18  4:15 PM  Result Value Ref Range   Glucose-Capillary 302 (H) 70 - 99 mg/dL   Comment 1 Notify RN    Comment 2 Document in Chart   Glucose, capillary     Status: Abnormal   Collection Time: 03/21/18 10:11 PM  Result Value Ref Range   Glucose-Capillary 350 (H) 70 - 99 mg/dL   Comment 1 Notify RN    Comment 2 Document in Chart   Glucose, capillary     Status: Abnormal   Collection Time: 03/22/18  8:17 AM  Result Value Ref Range   Glucose-Capillary 373 (H) 70 - 99 mg/dL    ABGS Recent Labs    03/20/18 2120  PHART 7.485*  PO2ART 91.8  HCO3 25.8   CULTURES Recent Results (from the past 240 hour(s))  Blood culture (routine x 2)     Status: None (Preliminary result)   Collection Time: 03/18/18 11:50 AM  Result Value Ref Range Status   Specimen Description BLOOD RIGHT HAND DRAWN BY RN  Final   Special Requests   Final    BOTTLES DRAWN AEROBIC AND ANAEROBIC Blood Culture results may not be optimal due to an inadequate volume of blood received in culture bottles   Culture   Final    NO GROWTH 4 DAYS Performed at [facility], [address], [place],  [address]    Report Status PENDING  Incomplete  Blood culture (routine x 2)     Status: None (Preliminary result)   Collection Time: 03/18/18 11:50 AM  Result Value Ref Range Status   Specimen Description BLOOD RIGHT ARM DRAWN BY RN  Final   Special Requests   Final    BOTTLES DRAWN  AEROBIC AND ANAEROBIC Blood  Culture adequate volume   Culture   Final    NO GROWTH 4 DAYS Performed at [facility], [address] [place], [place] [address]    Report Status PENDING  Incomplete  Respiratory Panel by PCR     Status: Abnormal   Collection Time: 03/18/18  3:27 PM  Result Value Ref Range Status   Adenovirus NOT DETECTED NOT DETECTED Final   Coronavirus 229E NOT DETECTED NOT DETECTED Final    Comment: (NOTE) The Coronavirus on the Respiratory Panel, DOES NOT test for the novel  Coronavirus (2019 nCoV)    Coronavirus HKU1 NOT DETECTED NOT DETECTED Final   Coronavirus NL63 NOT DETECTED NOT DETECTED Final   Coronavirus OC43 NOT DETECTED NOT DETECTED Final   Metapneumovirus NOT DETECTED NOT DETECTED Final   Rhinovirus / Enterovirus DETECTED (A) NOT DETECTED Final   Influenza A NOT DETECTED NOT DETECTED Final   Influenza B NOT DETECTED NOT DETECTED Final   Parainfluenza Virus 1 NOT DETECTED NOT DETECTED Final   Parainfluenza Virus 2 NOT DETECTED NOT DETECTED Final   Parainfluenza Virus 3 NOT DETECTED NOT DETECTED Final   Parainfluenza Virus 4 NOT DETECTED NOT DETECTED Final   Respiratory Syncytial Virus NOT DETECTED NOT DETECTED Final   Bordetella pertussis NOT DETECTED NOT DETECTED Final   Chlamydophila pneumoniae NOT DETECTED NOT DETECTED Final   Mycoplasma pneumoniae NOT DETECTED NOT DETECTED Final    Comment: Performed at [facility], [place]. [address] [place], [place] [address]  MRSA PCR Screening     Status: None   Collection Time: 03/18/18  3:27 PM  Result Value Ref Range Status   MRSA by PCR NEGATIVE NEGATIVE Final    Comment:        The GeneXpert MRSA Assay (FDA approved for NASAL specimens only), is one component of a comprehensive MRSA colonization surveillance program. It is not intended to diagnose MRSA infection nor to guide or monitor treatment for MRSA infections. Performed at [facility], [address], [place], [place] [address]    Studies/Results: Dg Chest 1  View  Result Date: 03/20/2018 CLINICAL DATA:  Cough, chest congestion, shortness of breath and generalized weakness since 03/14/2018. Ex-smoker. History of asthma. EXAM: CHEST  1 VIEW COMPARISON:  03/18/2018. FINDINGS: Poor inspiration with some improvement. Mildly progressive bibasilar linear scarring and possible atelectasis. Otherwise, clear lungs. No pleural fluid. Grossly normal sized heart, magnified by the poor inspiration and portable AP technique. Unremarkable bones. IMPRESSION: Poor inspiration with mildly progressive bibasilar linear scarring and possible atelectasis. Electronically Signed   By: Claudie Revering M.D.   On: 03/20/2018 21:00   Ct Angio Chest Pe W Or Wo Contrast  Result Date: 03/21/2018 CLINICAL DATA:  Cough, chest congestion, weakness and fever beginning 1 week ago. Poor oxygen saturation. EXAM: CT ANGIOGRAPHY CHEST WITH CONTRAST TECHNIQUE: Multidetector CT imaging of the chest was performed using the standard protocol during bolus administration of intravenous contrast. Multiplanar CT image reconstructions and MIPs were obtained to evaluate the vascular anatomy. CONTRAST:  178m OMNIPAQUE IOHEXOL 350 MG/ML SOLN COMPARISON:  Chest radiography yesterday.  Chest CT 08/26/2017 FINDINGS: Cardiovascular: Pulmonary arterial opacification is moderate to good. There are no visible pulmonary emboli. No aortic atherosclerosis. No coronary artery calcification. Heart size is normal. Mediastinum/Nodes: No mediastinal mass or lymphadenopathy. Lungs/Pleura: There is bilateral atelectatic pneumonia, lower lobe predominant. No evidence of ground-glass opacity to specifically suggest viral pneumonitis. No evidence of underlying mass lesion. No pleural effusion. Upper Abdomen: Normal.  Previous cholecystectomy. Musculoskeletal: Normal Review of the MIP images confirms the above findings. IMPRESSION: No pulmonary emboli. Bilateral patchy mid to lower lung atelectasis/infiltrate consistent with atelectatic  pneumonia. No finding specific for viral pneumonitis. Electronically Signed   By: Nelson Chimes M.D.   On: 03/21/2018 10:46    Medications:  Prior to Admission:  Medications Prior to Admission  Medication Sig Dispense Refill Last Dose  . acetaminophen (TYLENOL) 500 MG tablet Take 1,000 mg by mouth every 6 (six) hours as needed.   03/18/2018  . albuterol (PROVENTIL HFA;VENTOLIN HFA) 108 (90 Base) MCG/ACT inhaler Inhale 2 puffs into the lungs every 6 (six) hours as needed for wheezing or shortness of breath. 1 Inhaler 2 03/10/2018  . albuterol (PROVENTIL) (5 MG/ML) 0.5% nebulizer solution Take 0.5 mLs (2.5 mg total) by nebulization every 6 (six) hours as needed for wheezing or shortness of breath. 20 mL 0 03/17/2018  . allopurinol (ZYLOPRIM) 300 MG tablet Take 300 mg by mouth every evening.    03/17/2018  . butalbital-acetaminophen-caffeine (FIORICET, ESGIC) 50-325-40 MG tablet Take 1 tablet by mouth once a week.    Past Month at Unknown time  . Calcium Carb-Cholecalciferol [phone] MG-UNIT CAPS Take 2 capsules by mouth every evening.   03/17/2018  . calcium carbonate (TUMS CHEWY BITES) 750 MG chewable tablet Chew 1 tablet by mouth as needed for heartburn.   unknown  . fexofenadine (ALLEGRA) 180 MG tablet Take 180 mg by mouth every evening.   03/17/2018  . fluticasone (FLONASE) 50 MCG/ACT nasal spray Place 2 sprays into both nostrils daily.    03/17/2018  . hydrochlorothiazide (MICROZIDE) 12.5 MG capsule Take 1 capsule (12.5 mg total) by mouth daily. 30 capsule 3 03/17/2018  . ibuprofen (ADVIL,MOTRIN) 800 MG tablet Take 800 mg by mouth every 8 (eight) hours as needed.   03/18/2018  . Insulin Glargine-Lixisenatide (SOLIQUA) 100-33 UNT-MCG/ML SOPN Inject 60 Units into the skin at bedtime.    03/17/2018  . insulin lispro (HUMALOG) 100 UNIT/ML injection Inject 10-22 Units into the skin 4 (four) times daily. Sliding scale 4 times a day   03/17/2018  . ipratropium (ATROVENT) 0.02 % nebulizer solution Take 0.5 mg by  nebulization every 4 (four) hours as needed for wheezing or shortness of breath.   03/17/2018  . KOMBIGLYZE XR 2.05-998 MG TB24 Take 1 tablet by mouth daily.   11 03/17/2018  . levofloxacin (LEVAQUIN) 750 MG tablet Take 750 mg by mouth daily.    03/17/2018  . pantoprazole (PROTONIX) 40 MG tablet Take 1 tablet (40 mg total) by mouth daily. 30 tablet 2 03/17/2018   Scheduled: . allopurinol  300 mg Oral QPM  . budesonide (PULMICORT) nebulizer solution  0.5 mg Nebulization BID  . calcium carbonate  1 tablet Oral BID WC   And  . cholecalciferol  1,000 Units Oral BID WC  . chlorhexidine  15 mL Mouth Rinse BID  . Chlorhexidine Gluconate Cloth  6 each Topical Daily  . enoxaparin (LOVENOX) injection  70 mg Subcutaneous Q24H  . fluticasone  2 spray Each Nare QPM  . guaiFENesin  1,200 mg Oral BID  . insulin aspart  0-20 Units Subcutaneous TID WC  . insulin aspart  0-5 Units Subcutaneous QHS  . insulin aspart  6 Units Subcutaneous TID WC  . insulin glargine  60 Units Subcutaneous QHS  . ipratropium  0.5 mg Nebulization Q6H  . levalbuterol  0.63 mg Nebulization Q6H  . loratadine  10 mg Oral Daily  . mouth rinse  15 mL Mouth Rinse q12n4p  . methylPREDNISolone (SOLU-MEDROL) injection  60 mg Intravenous Q6H  . pantoprazole  40 mg Oral Daily   Continuous: . sodium chloride Stopped (03/18/18 1506)  . albuterol 15 mg/hr (03/18/18 1151)  . piperacillin-tazobactam (ZOSYN)  IV    . vancomycin     Followed by  . vancomycin     ZVJ:KQASUO chloride, acetaminophen **OR** acetaminophen, benzonatate, ibuprofen, levalbuterol, ondansetron **OR** ondansetron (ZOFRAN) IV  Assesment: She was admitted with acute hypoxic respiratory failure and this is on the basis of pneumonia based on her CT which I have personally reviewed.  She is still requiring high flow oxygen.  She sounds a little better today but I do think we need to broaden her antibiotic coverage.  She does have history of tobacco use so she may have  some element of COPD but that is being treated  She has diabetes at baseline  She has morbid obesity which is unchanged Active Problems:   Morbid obesity ([place])   Tobacco abuse   Bronchospasm, acute   Acute respiratory failure with hypoxia ([place])   Uncontrolled type 2 diabetes mellitus with hyperglycemia ([place])    Plan: Switch to cefepime and vancomycin.  Continue other treatments.      LOS: 4 days   Alonza Bogus 03/22/2018, 8:40 AM

## 2018-03-22 NOTE — Progress Notes (Signed)
Patient still on high flow sitting up states she slept well. Looks improved Sats 96 on 12.

## 2018-03-22 NOTE — Progress Notes (Signed)
Inpatient Diabetes Program Recommendations  AACE/ADA: New Consensus Statement on Inpatient Glycemic Control   Target Ranges:  Prepandial:   less than 140 mg/dL      Peak postprandial:   less than 180 mg/dL (1-2 hours)      Critically ill patients:  140 - 180 mg/dL   Results for Cheryl Black, Cheryl Black (MRN [id number]) as of 03/22/2018 07:58  Ref. Range 03/21/2018 07:39 03/21/2018 11:43 03/21/2018 16:15 03/21/2018 22:11  Glucose-Capillary Latest Ref Range: 70 - 99 mg/dL 864 (H)  Novolog 13 units 330 (H)  Novolog 21 units 302 (H)  Novolog 21 units 350 (H)  Novolog 4 units  Lantus 60 units QHS   Review of Glycemic Control  Diabetes history: DM2 Outpatient Diabetes medications: Soliqua 100-33 units-mcg/ml 60 units QHS, Huamlog 10-22 units QID, Kombiglyze XR 2.05-998 mg daily Current orders for Inpatient glycemic control: Lantus 60 units QHS, Novolog 6 units TID with meals, Novolog 0-20 units TID with meals, Novolog 0-5 units QHS; Solumedrol 125 mg Q6H  Inpatient Diabetes Program Recommendations:  Insulin - Basal: If steroids are continued, please consider increasing Lantus to 70 units QHS. Insulin - Meal Coverage: If steroids are continued as ordered, please consider increasing meal coverage to Novolog 15 units TID with meals.  Thanks, Orlando Penner, RN, MSN, CDE Diabetes Coordinator Inpatient Diabetes Program [phone] (Team Pager from 8am to 5pm)

## 2018-03-22 NOTE — Progress Notes (Signed)
Patient looks better but still is requiring 12 liters oxygen to maintain her saturation. CT showed pneumonia in lower lobes, no pulmonary emboli. Still requires 60 FiO2 on BiPAP while asleep increased pressure some from night before due to snoring noted . Snoring is sign EPAP not high enough for sleep. She is on auto CPAP at home.

## 2018-03-22 NOTE — Progress Notes (Addendum)
Pharmacy Antibiotic Note  Cheryl Black is a 33 y.o. female admitted on 03/18/2018 with HCAP/ pneumonia.  Pharmacy has been consulted for Vancomycin and Cefepime dosing.  Plan:  Vancomycin 2000mg  loading dose, 1250mg   IV every 8 hours.  Goal trough 15-20 mcg/mL. Cefepime 2gm IV q8h F/Ucxs and clinical progress Monitor V/S, labs and levels as indicated  Height: 5\' 8"  (172.7 cm) Weight: 296 lb 1.2 oz (134.3 kg) IBW/kg (Calculated) : 63.9  Temp (24hrs), Avg:98.2 F (36.8 C), Min:97.8 F (36.6 C), Max:98.6 F (37 C)  Recent Labs  Lab 03/18/18 1150 03/19/18 0436 03/21/18 0503  WBC 8.2 13.9* 16.7*  CREATININE 0.57 0.59 0.58    Estimated Creatinine Clearance: 144.1 mL/min (by C-G formula based on SCr of 0.58 mg/dL).    Allergies  Allergen Reactions  . Ace Inhibitors Anaphylaxis  . Beta Adrenergic Blockers Other (See Comments)    Angioedema   . Augmentin [Amoxicillin-Pot Clavulanate] Other (See Comments)    Has had C.diff in the past  . Benicar [Olmesartan] Other (See Comments)    Facial edema  . Doxycycline Other (See Comments)    Makes feet and hands red  . Eggs Or Egg-Derived Products     Local reaction with injectable meds that contain eggs  . Influenza Vaccine Live Swelling    Arm   . Invokana [Canagliflozin] Other (See Comments)    Dehydration     Antimicrobials this admission: Vancomycin 3/16 >>  Cefepime 3/16 >> Levaquin 3/12>>3/16   Dose adjustments this admission: N/A  Microbiology results: 3/12 BCx: ngtd 3/12 MRSA PCR: negative  Thank you for allowing pharmacy to be a part of this patient's care.  Elder Cyphers, BS Pharm D, [place] Clinical Pharmacist Pager [phone] 03/22/2018 8:04 AM

## 2018-03-23 ENCOUNTER — Ambulatory Visit: Payer: 59 | Admitting: Internal Medicine

## 2018-03-23 LAB — COMPREHENSIVE METABOLIC PANEL
ALT: 110 U/L — ABNORMAL HIGH (ref 0–44)
AST: 51 U/L — ABNORMAL HIGH (ref 15–41)
Albumin: 3 g/dL — ABNORMAL LOW (ref 3.5–5.0)
Alkaline Phosphatase: 70 U/L (ref 38–126)
Anion gap: 8 (ref 5–15)
BUN: 19 mg/dL (ref 6–20)
CO2: 27 mmol/L (ref 22–32)
Calcium: 8.3 mg/dL — ABNORMAL LOW (ref 8.9–10.3)
Chloride: 100 mmol/L (ref 98–111)
Creatinine, Ser: 0.6 mg/dL (ref 0.44–1.00)
GFR calc Af Amer: >60 mL/min (ref >60)
GFR calc non Af Amer: >60 mL/min (ref >60)
Glucose, Bld: 249 mg/dL — ABNORMAL HIGH (ref 70–99)
Potassium: 4.1 mmol/L (ref 3.5–5.1)
Sodium: 135 mmol/L (ref 135–145)
Total Bilirubin: 0.4 mg/dL (ref 0.3–1.2)
Total Protein: 6 g/dL — ABNORMAL LOW (ref 6.5–8.1)

## 2018-03-23 LAB — CULTURE, BLOOD (ROUTINE X 2)
Culture: NO GROWTH
Culture: NO GROWTH
Special Requests: ADEQUATE

## 2018-03-23 LAB — GLUCOSE, CAPILLARY
GLUCOSE-CAPILLARY: 273 mg/dL — AB (ref 70–99)
Glucose-Capillary: 226 mg/dL — ABNORMAL HIGH (ref 70–99)
Glucose-Capillary: 327 mg/dL — ABNORMAL HIGH (ref 70–99)
Glucose-Capillary: 279 mg/dL — ABNORMAL HIGH (ref 70–99)

## 2018-03-23 LAB — MAGNESIUM: Magnesium: 2.1 mg/dL (ref 1.7–2.4)

## 2018-03-23 MED ORDER — METHYLPREDNISOLONE SODIUM SUCC 125 MG IJ SOLR
60.0000 mg | Freq: Once | INTRAMUSCULAR | Status: AC
  Administered 2018-03-23: 60 mg via INTRAVENOUS

## 2018-03-23 MED ORDER — PREDNISONE 20 MG PO TABS
60.0000 mg | ORAL_TABLET | Freq: Every day | ORAL | Status: DC
  Administered 2018-03-24: 60 mg via ORAL
  Filled 2018-03-23: qty 3

## 2018-03-23 NOTE — Progress Notes (Signed)
Subjective: She says she feels better.  She was admitted with febrile illness presumably viral and she now has pneumonia per CT.  Viral panel shows rhinovirus enterovirus but no influenza.  She works as an Public librarian.  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  Her husband was sick with COPD exacerbation prior to her getting admitted but no other sick contacts.  She had been on Levaquin was switched to cefepime and vancomycin but her staph testing was negative so she is now on cefepime.  She is on Solu-Medrol because of concerns about potential COPD with her smoking history.  She is improving and requiring less oxygen.  She is still using BiPAP at night which she finds helpful  Objective: Vital signs in last 24 hours: Temp:  [97.4 F (36.3 C)-98.6 F (37 C)] 97.7 F (36.5 C) (03/17 0533) Pulse Rate:  [50-79] 54 (03/17 0500) Resp:  [12-28] 25 (03/17 0500) BP: (104-138)/(63-100) 107/74 (03/17 0500) SpO2:  [89 %-99 %] 97 % (03/17 0500) FiO2 (%):  [60 %] 60 % (03/17 0236) Weight:  [134.3 kg] 134.3 kg (03/17 0445) Weight change: 0 kg Last BM Date: 03/18/18  Intake/Output from previous day: 03/16 0701 - 03/17 0700 In: 1048.6 [I.V.:141.9; IV Piggyback:906.6] Out: -   PHYSICAL EXAM General appearance: alert, cooperative, no distress and morbidly obese Resp: Her chest is clear today the first time since I have been listening to her.  She has a somewhat prolonged expiratory phase but no wheezing no rales Cardio: regular rate and rhythm, S1, S2 normal, no murmur, click, rub or gallop GI: soft, non-tender; bowel sounds normal; no masses,  no organomegaly Extremities: extremities normal, atraumatic, no cyanosis or edema  Lab Results:  Results for orders placed or performed during the hospital encounter of 03/18/18 (from the past 48 hour(s))  Glucose, capillary     Status: Abnormal   Collection Time:  03/21/18 11:43 AM  Result Value Ref Range   Glucose-Capillary 330 (H) 70 - 99 mg/dL  Glucose, capillary     Status: Abnormal   Collection Time: 03/21/18  4:15 PM  Result Value Ref Range   Glucose-Capillary 302 (H) 70 - 99 mg/dL   Comment 1 Notify RN    Comment 2 Document in Chart   Glucose, capillary     Status: Abnormal   Collection Time: 03/21/18 10:11 PM  Result Value Ref Range   Glucose-Capillary 350 (H) 70 - 99 mg/dL   Comment 1 Notify RN    Comment 2 Document in Chart   Glucose, capillary     Status: Abnormal   Collection Time: 03/22/18  8:17 AM  Result Value Ref Range   Glucose-Capillary 373 (H) 70 - 99 mg/dL  Glucose, capillary     Status: Abnormal   Collection Time: 03/22/18 11:25 AM  Result Value Ref Range   Glucose-Capillary 340 (H) 70 - 99 mg/dL  Glucose, capillary     Status: Abnormal   Collection Time: 03/22/18  5:08 PM  Result Value Ref Range   Glucose-Capillary 219 (H) 70 - 99 mg/dL  Glucose, capillary     Status: Abnormal   Collection Time: 03/22/18  9:25 PM  Result Value Ref Range   Glucose-Capillary 383 (H) 70 - 99 mg/dL  Comprehensive metabolic panel     Status: Abnormal   Collection Time: 03/23/18  5:11 AM  Result Value Ref Range   Sodium 135 135 -  145 mmol/L   Potassium 4.1 3.5 - 5.1 mmol/L   Chloride 100 98 - 111 mmol/L   CO2 27 22 - 32 mmol/L   Glucose, Bld 249 (H) 70 - 99 mg/dL   BUN 19 6 - 20 mg/dL   Creatinine, Ser 0.60 0.44 - 1.00 mg/dL   Calcium 8.3 (L) 8.9 - 10.3 mg/dL   Total Protein 6.0 (L) 6.5 - 8.1 g/dL   Albumin 3.0 (L) 3.5 - 5.0 g/dL   AST 51 (H) 15 - 41 U/L   ALT 110 (H) 0 - 44 U/L   Alkaline Phosphatase 70 38 - 126 U/L   Total Bilirubin 0.4 0.3 - 1.2 mg/dL   GFR calc non Af Amer >60 >60 mL/min   GFR calc Af Amer >60 >60 mL/min   Anion gap 8 5 - 15    Comment: Performed at [facility], [address], [place], [place] [address]  Magnesium     Status: None   Collection Time: 03/23/18  5:11 AM  Result Value Ref Range    Magnesium 2.1 1.7 - 2.4 mg/dL    Comment: Performed at [facility], [address] [place], [place] [address]    ABGS Recent Labs    03/20/18 2120  PHART 7.485*  PO2ART 91.8  HCO3 25.8   CULTURES Recent Results (from the past 240 hour(s))  Blood culture (routine x 2)     Status: None   Collection Time: 03/18/18 11:50 AM  Result Value Ref Range Status   Specimen Description BLOOD RIGHT HAND DRAWN BY RN  Final   Special Requests   Final    BOTTLES DRAWN AEROBIC AND ANAEROBIC Blood Culture results may not be optimal due to an inadequate volume of blood received in culture bottles   Culture   Final    NO GROWTH 5 DAYS Performed at [facility], [address] [place], [place] [address]    Report Status 03/23/2018 FINAL  Final  Blood culture (routine x 2)     Status: None   Collection Time: 03/18/18 11:50 AM  Result Value Ref Range Status   Specimen Description BLOOD RIGHT ARM DRAWN BY RN  Final   Special Requests   Final    BOTTLES DRAWN AEROBIC AND ANAEROBIC Blood Culture adequate volume   Culture   Final    NO GROWTH 5 DAYS Performed at [facility], [address] [place], [place] [address]    Report Status 03/23/2018 FINAL  Final  Respiratory Panel by PCR     Status: Abnormal   Collection Time: 03/18/18  3:27 PM  Result Value Ref Range Status   Adenovirus NOT DETECTED NOT DETECTED Final   Coronavirus 229E NOT DETECTED NOT DETECTED Final    Comment: (NOTE) The Coronavirus on the Respiratory Panel, DOES NOT test for the novel  Coronavirus (2019 nCoV)    Coronavirus HKU1 NOT DETECTED NOT DETECTED Final   Coronavirus NL63 NOT DETECTED NOT DETECTED Final   Coronavirus OC43 NOT DETECTED NOT DETECTED Final   Metapneumovirus NOT DETECTED NOT DETECTED Final   Rhinovirus / Enterovirus DETECTED (A) NOT DETECTED Final   Influenza A NOT DETECTED NOT DETECTED Final   Influenza B NOT DETECTED NOT DETECTED Final   Parainfluenza Virus 1 NOT DETECTED NOT DETECTED Final    Parainfluenza Virus 2 NOT DETECTED NOT DETECTED Final   Parainfluenza Virus 3 NOT DETECTED NOT DETECTED Final   Parainfluenza Virus 4 NOT DETECTED NOT DETECTED Final   Respiratory Syncytial Virus NOT  DETECTED NOT DETECTED Final   Bordetella pertussis NOT DETECTED NOT DETECTED Final   Chlamydophila pneumoniae NOT DETECTED NOT DETECTED Final   Mycoplasma pneumoniae NOT DETECTED NOT DETECTED Final    Comment: Performed at [facility], [place] [address] [place], [place] [address]  MRSA PCR Screening     Status: None   Collection Time: 03/18/18  3:27 PM  Result Value Ref Range Status   MRSA by PCR NEGATIVE NEGATIVE Final    Comment:        The GeneXpert MRSA Assay (FDA approved for NASAL specimens only), is one component of a comprehensive MRSA colonization surveillance program. It is not intended to diagnose MRSA infection nor to guide or monitor treatment for MRSA infections. Performed at [facility], [address] [place], [place] [address]    Studies/Results: Ct Angio Chest Pe W Or Wo Contrast  Result Date: 03/21/2018 CLINICAL DATA:  Cough, chest congestion, weakness and fever beginning 1 week ago. Poor oxygen saturation. EXAM: CT ANGIOGRAPHY CHEST WITH CONTRAST TECHNIQUE: Multidetector CT imaging of the chest was performed using the standard protocol during bolus administration of intravenous contrast. Multiplanar CT image reconstructions and MIPs were obtained to evaluate the vascular anatomy. CONTRAST:  151m OMNIPAQUE IOHEXOL 350 MG/ML SOLN COMPARISON:  Chest radiography yesterday.  Chest CT 08/26/2017 FINDINGS: Cardiovascular: Pulmonary arterial opacification is moderate to good. There are no visible pulmonary emboli. No aortic atherosclerosis. No coronary artery calcification. Heart size is normal. Mediastinum/Nodes: No mediastinal mass or lymphadenopathy. Lungs/Pleura: There is bilateral atelectatic pneumonia, lower lobe predominant. No evidence of ground-glass opacity to  specifically suggest viral pneumonitis. No evidence of underlying mass lesion. No pleural effusion. Upper Abdomen: Normal.  Previous cholecystectomy. Musculoskeletal: Normal Review of the MIP images confirms the above findings. IMPRESSION: No pulmonary emboli. Bilateral patchy mid to lower lung atelectasis/infiltrate consistent with atelectatic pneumonia. No finding specific for viral pneumonitis. Electronically Signed   By: MNelson ChimesM.D.   On: 03/21/2018 10:46    Medications:  Prior to Admission:  Medications Prior to Admission  Medication Sig Dispense Refill Last Dose  . acetaminophen (TYLENOL) 500 MG tablet Take 1,000 mg by mouth every 6 (six) hours as needed.   03/18/2018  . albuterol (PROVENTIL HFA;VENTOLIN HFA) 108 (90 Base) MCG/ACT inhaler Inhale 2 puffs into the lungs every 6 (six) hours as needed for wheezing or shortness of breath. 1 Inhaler 2 03/10/2018  . albuterol (PROVENTIL) (5 MG/ML) 0.5% nebulizer solution Take 0.5 mLs (2.5 mg total) by nebulization every 6 (six) hours as needed for wheezing or shortness of breath. 20 mL 0 03/17/2018  . allopurinol (ZYLOPRIM) 300 MG tablet Take 300 mg by mouth every evening.    03/17/2018  . butalbital-acetaminophen-caffeine (FIORICET, ESGIC) 50-325-40 MG tablet Take 1 tablet by mouth once a week.    Past Month at Unknown time  . Calcium Carb-Cholecalciferol [phone] MG-UNIT CAPS Take 2 capsules by mouth every evening.   03/17/2018  . calcium carbonate (TUMS CHEWY BITES) 750 MG chewable tablet Chew 1 tablet by mouth as needed for heartburn.   unknown  . fexofenadine (ALLEGRA) 180 MG tablet Take 180 mg by mouth every evening.   03/17/2018  . fluticasone (FLONASE) 50 MCG/ACT nasal spray Place 2 sprays into both nostrils daily.    03/17/2018  . hydrochlorothiazide (MICROZIDE) 12.5 MG capsule Take 1 capsule (12.5 mg total) by mouth daily. 30 capsule 3 03/17/2018  . ibuprofen (ADVIL,MOTRIN) 800 MG tablet Take 800 mg by mouth every 8 (eight) hours  as needed.    03/18/2018  . Insulin Glargine-Lixisenatide (SOLIQUA) 100-33 UNT-MCG/ML SOPN Inject 60 Units into the skin at bedtime.    03/17/2018  . insulin lispro (HUMALOG) 100 UNIT/ML injection Inject 10-22 Units into the skin 4 (four) times daily. Sliding scale 4 times a day   03/17/2018  . ipratropium (ATROVENT) 0.02 % nebulizer solution Take 0.5 mg by nebulization every 4 (four) hours as needed for wheezing or shortness of breath.   03/17/2018  . KOMBIGLYZE XR 2.05-998 MG TB24 Take 1 tablet by mouth daily.   11 03/17/2018  . levofloxacin (LEVAQUIN) 750 MG tablet Take 750 mg by mouth daily.    03/17/2018  . pantoprazole (PROTONIX) 40 MG tablet Take 1 tablet (40 mg total) by mouth daily. 30 tablet 2 03/17/2018   Scheduled: . allopurinol  300 mg Oral QPM  . arformoterol  15 mcg Nebulization BID  . budesonide (PULMICORT) nebulizer solution  0.5 mg Nebulization BID  . calcium carbonate  1 tablet Oral BID WC   And  . cholecalciferol  1,000 Units Oral BID WC  . chlorhexidine  15 mL Mouth Rinse BID  . Chlorhexidine Gluconate Cloth  6 each Topical Daily  . enoxaparin (LOVENOX) injection  70 mg Subcutaneous Q24H  . fluticasone  2 spray Each Nare QPM  . guaiFENesin  1,200 mg Oral BID  . insulin aspart  0-20 Units Subcutaneous TID WC  . insulin aspart  0-5 Units Subcutaneous QHS  . insulin aspart  15 Units Subcutaneous TID WC  . insulin glargine  70 Units Subcutaneous QHS  . ipratropium  0.5 mg Nebulization Q6H  . levalbuterol  0.63 mg Nebulization Q6H  . loratadine  10 mg Oral Daily  . mouth rinse  15 mL Mouth Rinse q12n4p  . methylPREDNISolone (SOLU-MEDROL) injection  60 mg Intravenous Q6H  . pantoprazole  40 mg Oral Daily   Continuous: . sodium chloride Stopped (03/23/18 0530)  . albuterol 15 mg/hr (03/18/18 1151)  . ceFEPime (MAXIPIME) IV 2 g (03/23/18 0530)   AUQ:JFHLKT chloride, acetaminophen **OR** acetaminophen, benzonatate, ibuprofen, levalbuterol, ondansetron **OR** ondansetron (ZOFRAN)  IV  Assesment: She was admitted with acute bronchospasm and acute hypoxic respiratory failure.  She had significant problem with wheezing and chest tightness and that is better.  Her oxygen requirement is less.  She required BiPAP and is still using BiPAP at night.  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  She has obesity which is unchanged  She has history of tobacco abuse but no definite diagnosis of any sort of respiratory disease.  This will need to be evaluated further as an outpatient Active Problems:   Morbid obesity ([place])   Tobacco abuse   Bronchospasm, acute   Acute respiratory failure with hypoxia ([place])   Uncontrolled type 2 diabetes mellitus with hyperglycemia ([place])    Plan: Continue current treatments.  She is improving now.  Oxygen requirement is decreasing.  She has much less respiratory distress.  We will plan to continue cefepime and steroids inhaled bronchodilators.  Based on her situation now I think it is likely that she will require home oxygen briefly    LOS: 5 days   Cheryl Black 03/23/2018, 7:45 AM

## 2018-03-23 NOTE — Progress Notes (Addendum)
Inpatient Diabetes Program Recommendations  AACE/ADA: New Consensus Statement on Inpatient Glycemic Control   Target Ranges:  Prepandial:   less than 140 mg/dL      Peak postprandial:   less than 180 mg/dL (1-2 hours)      Critically ill patients:  140 - 180 mg/dL  Results for FRANCHELLE, RECIO (MRN [id number]) as of 03/23/2018 07:47  Ref. Range 03/23/2018 05:11  Glucose Latest Ref Range: 70 - 99 mg/dL 462 (H)   Results for BRITZY, LONGENECKER (MRN [id number]) as of 03/23/2018 07:47  Ref. Range 03/22/2018 08:17 03/22/2018 11:25 03/22/2018 17:08 03/22/2018 21:25  Glucose-Capillary Latest Ref Range: 70 - 99 mg/dL 182 (H) 993 (H) 716 (H) 383 (H)    Review of Glycemic Control  Diabetes history: DM2 Outpatient Diabetes medications: Soliqua 100-33 units-mcg/ml 60 units QHS, Huamlog 10-22 units QID, Kombiglyze XR 2.05-998 mg daily Current orders for Inpatient glycemic control: Lantus 70 units QHS, Novolog 15 units TID with meals, Novolog 0-20 units TID with meals, Novolog 0-5 units QHS; Solumedrol 60 mg Q6H  Inpatient Diabetes Program Recommendations:   Insulin - Basal: If steroids are continued, please consider increasing Lantus to 75 units QHS.  Insulin - Meal Coverage: If steroids are continued as ordered, please consider increasing meal coverage to Novolog 20 units TID with meals.  Thanks, Orlando Penner, RN, MSN, CDE Diabetes Coordinator Inpatient Diabetes Program [phone] (Team Pager from 8am to 5pm)

## 2018-03-23 NOTE — Progress Notes (Addendum)
PROGRESS NOTE  Cheryl Black RRN:[id number] DOB: [birth year] DOA: 03/18/2018 PCP: [name], MD  Brief History: 33 y.o.femalewith medical history ofdiabetes mellitus type 2, PCOS, hyperlipidemia, obesity, tobacco abuse presenting with cough, congestion, and generalized weakness that began on 03/14/2018. The patient had been running fevers up to 101.0 F at home. She went to see her primary care provider on 03/15/2018 and was started on Augmentin and doxycycline. The patient had taken 2 doses of Augmentin. However, when she took the doxycycline, she developed a rash on her hands. She stated that her breathing and coughing and chest congestion did not improve. She went back to see her primary care provider on 03/18/2018. The patient was given a prescription for levofloxacin, but her breathing was worsening. As result, the patient was sent to emergency department for further evaluation. The patient denies any recent travels outside of [place]. However, she works as an Therapist, sports in the emergency department. Her last shift at work was on 03/10/2018. The patient lives at home with her spouse who was recently discharged from the hospital on 03/12/2018 after a stay for treatment for COPD exacerbation. He also smokes, but he has not traveled outside [place] in the past month. The patient has not had any sick close contacts. Patient has had some chest tightness with her coughing but she denies any nausea, vomiting, diarrhea, abdominal pain, headache, sore throat. There is no abdominal pain, dysuria, hematuria. She has been using her nebulizer machine at home without much improvement. In the emergency department, the patient developed some respiratory distress. She was placed on BiPAP. She was given bronchodilators and the Brethine. She was started on Solu-Medrol and levofloxacin. The patient was admitted for further evaluationand treatment.  Assessment/Plan: Acute Respiratory  Failure with Hypoxia -due to bronchospasm, likely due to underlying viral infection -pt does not have diagnosis of asthma or COPD -Patient had seen pulmonology, Dr. Melvyn Novas on 09/24/2016--PFTs did not suggest COPD or asthma -?reactive airway disease -stable on BiPAP>>>weaned to 10 L HFNC>>>5L -PCT <0.10 -3/15 CTA chest--no PE, no GOO, bibasilar atelectatic PNA -cefepime per pulmonary -d/c vancomycin--MRSA screening neg  Acute Bronchospasm/reactive airway disease -personally reviewed CXR--interstitial coarsening without consolidation -Viral respiratory panel-->+rhinovirus/enterovirus -Influenza rapid antigen test was negative in the office -ContinuePulmicort -ContinueXopenex -ContinueSolu-Medrol>>>wean to prednisone 3/18 -Continue loratadine -added Brovana  Diabetes mellitus type 2, uncontrolled with hyperglycemia -Hemoglobin A1c--8.9 -IncreaseLantus 70 units daily -Resistant NovoLog sliding scale -Holding Kombiglyze -increase novolog 15 units with meals  Morbid obesity -BMI 45.61 -Lifestyle modification  PCOS -Patient follows with Dr. [name] -No longer on Provera  Transaminasemia -due to hepatic steatosis (noted of prior CTs) -no abd pain; tolerating diet -hep B surface antigen -hep C antibody  Tobacco Abuse -I have discussed tobacco cessation with the patient. I have counseled the patient regarding the negative impacts of continued tobacco use including but not limited to lung cancer, COPD, and cardiovascular disease. I have discussed alternatives to tobacco and modalities that may help facilitate tobacco cessation including but not limited to biofeedback, hypnosis, and medications. Total time spent with tobacco counseling was 4 minutes.    Disposition Plan: Home in 1-2days  Family Communication:No family present  Consultants:none  Code Status: FULL   DVT Prophylaxis: [place] Lovenox   Procedures: As Listed in Progress Note Above   Antibiotics: Levofloxacin 3/12>>3/13 Cefepime 3/16>>>      Subjective: Pt overall breathing better, but still has dyspnea on exertion.  She denies cp,  n/v/d, abd pain.  Has dry cough.  Has some postural dizziness.  No f/c  Objective: Vitals:   03/23/18 1300 03/23/18 1400 03/23/18 1428 03/23/18 1600  BP:   129/77   Pulse: 63 75 61   Resp: (!) 21 (!) 24 (!) 34   Temp:    (!) 97.3 F (36.3 C)  TempSrc:    Oral  SpO2: 98% 97% 96%   Weight:      Height:        Intake/Output Summary (Last 24 hours) at 03/23/2018 1630 Last data filed at 03/23/2018 1318 Gross per 24 hour  Intake 1267.56 ml  Output -  Net 1267.56 ml   Weight change: 0 kg Exam:   General:  Pt is alert, follows commands appropriately, not in acute distress  HEENT: No icterus, No thrush, No neck mass, [place]/AT  Cardiovascular: RRR, S1/S2, no rubs, no gallops  Respiratory: bibasilar rales, no wheeze  Abdomen: Soft/+BS, non tender, non distended, no guarding  Extremities: No edema, No lymphangitis, No petechiae, No rashes, no synovitis   Data Reviewed: I have personally reviewed following labs and imaging studies Basic Metabolic Panel: Recent Labs  Lab 03/18/18 1150 03/19/18 0436 03/21/18 0503 03/23/18 0511  NA 136 135 135 135  K 3.6 4.2 4.1 4.1  CL 107 108 101 100  CO2 18* 20* 25 27  GLUCOSE 255* 276* 278* 249*  BUN 8 11 21* 19  CREATININE 0.57 0.59 0.58 0.60  CALCIUM 8.7* 8.7* 8.4* 8.3*  MG  --   --   --  2.1   Liver Function Tests: Recent Labs  Lab 03/23/18 0511  AST 51*  ALT 110*  ALKPHOS 70  BILITOT 0.4  PROT 6.0*  ALBUMIN 3.0*   No results for input(s): LIPASE, AMYLASE in the last 168 hours. No results for input(s): AMMONIA in the last 168 hours. Coagulation Profile: No results for input(s): INR, PROTIME in the last 168 hours. CBC: Recent Labs  Lab 03/18/18 1150 03/19/18 0436 03/21/18 0503  WBC 8.2 13.9* 16.7*  NEUTROABS 5.5  --   --   HGB 14.4 13.8 13.7  HCT 44.2  42.7 42.7  MCV 88.6 89.9 91.0  PLT 238 238 256   Cardiac Enzymes: No results for input(s): CKTOTAL, CKMB, CKMBINDEX, TROPONINI in the last 168 hours. BNP: Invalid input(s): POCBNP CBG: Recent Labs  Lab 03/22/18 1708 03/22/18 2125 03/23/18 0757 03/23/18 1309 03/23/18 1546  GLUCAP 219* 383* 226* 327* 273*   HbA1C: No results for input(s): HGBA1C in the last 72 hours. Urine analysis:    Component Value Date/Time   COLORURINE YELLOW 08/17/2012 [place] 08/17/2012 0339   LABSPEC 1.020 08/17/2012 0339   PHURINE 5.5 08/17/2012 0339   GLUCOSEU >1000 (A) 08/17/2012 0339   HGBUR TRACE (A) 08/17/2012 0339   BILIRUBINUR NEGATIVE 08/17/2012 0339   KETONESUR NEGATIVE 08/17/2012 0339   PROTEINUR NEGATIVE 08/17/2012 0339   UROBILINOGEN 0.2 08/17/2012 0339   NITRITE NEGATIVE 08/17/2012 0339   LEUKOCYTESUR NEGATIVE 08/17/2012 0339   Sepsis Labs: @LABRCNTIP (BBCWUGQBVQXIH:0,TUUEKCMKLKJ:1) ) Recent Results (from the past 240 hour(s))  Blood culture (routine x 2)     Status: None   Collection Time: 03/18/18 11:50 AM  Result Value Ref Range Status   Specimen Description BLOOD RIGHT HAND DRAWN BY RN  Final   Special Requests   Final    BOTTLES DRAWN AEROBIC AND ANAEROBIC Blood Culture results may not be optimal due to an inadequate volume of blood received in culture  bottles   Culture   Final    NO GROWTH 5 DAYS Performed at [facility], [address] [place], [place] [address]    Report Status 03/23/2018 FINAL  Final  Blood culture (routine x 2)     Status: None   Collection Time: 03/18/18 11:50 AM  Result Value Ref Range Status   Specimen Description BLOOD RIGHT ARM DRAWN BY RN  Final   Special Requests   Final    BOTTLES DRAWN AEROBIC AND ANAEROBIC Blood Culture adequate volume   Culture   Final    NO GROWTH 5 DAYS Performed at [facility], [address], [place], [place] [address]    Report Status 03/23/2018 FINAL  Final  Respiratory Panel by PCR      Status: Abnormal   Collection Time: 03/18/18  3:27 PM  Result Value Ref Range Status   Adenovirus NOT DETECTED NOT DETECTED Final   Coronavirus 229E NOT DETECTED NOT DETECTED Final    Comment: (NOTE) The Coronavirus on the Respiratory Panel, DOES NOT test for the novel  Coronavirus (2019 nCoV)    Coronavirus HKU1 NOT DETECTED NOT DETECTED Final   Coronavirus NL63 NOT DETECTED NOT DETECTED Final   Coronavirus OC43 NOT DETECTED NOT DETECTED Final   Metapneumovirus NOT DETECTED NOT DETECTED Final   Rhinovirus / Enterovirus DETECTED (A) NOT DETECTED Final   Influenza A NOT DETECTED NOT DETECTED Final   Influenza B NOT DETECTED NOT DETECTED Final   Parainfluenza Virus 1 NOT DETECTED NOT DETECTED Final   Parainfluenza Virus 2 NOT DETECTED NOT DETECTED Final   Parainfluenza Virus 3 NOT DETECTED NOT DETECTED Final   Parainfluenza Virus 4 NOT DETECTED NOT DETECTED Final   Respiratory Syncytial Virus NOT DETECTED NOT DETECTED Final   Bordetella pertussis NOT DETECTED NOT DETECTED Final   Chlamydophila pneumoniae NOT DETECTED NOT DETECTED Final   Mycoplasma pneumoniae NOT DETECTED NOT DETECTED Final    Comment: Performed at [facility], [place] [address] [place], [place] [address]  MRSA PCR Screening     Status: None   Collection Time: 03/18/18  3:27 PM  Result Value Ref Range Status   MRSA by PCR NEGATIVE NEGATIVE Final    Comment:        The GeneXpert MRSA Assay (FDA approved for NASAL specimens only), is one component of a comprehensive MRSA colonization surveillance program. It is not intended to diagnose MRSA infection nor to guide or monitor treatment for MRSA infections. Performed at [facility], [address] [place] [address], [place], [place] [address]      Scheduled Meds: . allopurinol  300 mg Oral QPM  . arformoterol  15 mcg Nebulization BID  . budesonide (PULMICORT) nebulizer solution  0.5 mg Nebulization BID  . calcium carbonate  1 tablet Oral BID WC   And  . cholecalciferol   1,000 Units Oral BID WC  . chlorhexidine  15 mL Mouth Rinse BID  . Chlorhexidine Gluconate Cloth  6 each Topical Daily  . enoxaparin (LOVENOX) injection  70 mg Subcutaneous Q24H  . fluticasone  2 spray Each Nare QPM  . guaiFENesin  1,200 mg Oral BID  . insulin aspart  0-20 Units Subcutaneous TID WC  . insulin aspart  0-5 Units Subcutaneous QHS  . insulin aspart  15 Units Subcutaneous TID WC  . insulin glargine  70 Units Subcutaneous QHS  . ipratropium  0.5 mg Nebulization Q6H  . levalbuterol  0.63 mg Nebulization Q6H  . loratadine  10 mg Oral Daily  .  mouth rinse  15 mL Mouth Rinse q12n4p  . methylPREDNISolone (SOLU-MEDROL) injection  60 mg Intravenous Once  . pantoprazole  40 mg Oral Daily  . [START ON 03/24/2018] predniSONE  60 mg Oral Q breakfast   Continuous Infusions: . sodium chloride Stopped (03/23/18 0530)  . albuterol 15 mg/hr (03/18/18 1151)  . ceFEPime (MAXIPIME) IV 2 g (03/23/18 1318)    Procedures/Studies: Dg Chest 1 View  Result Date: 03/20/2018 CLINICAL DATA:  Cough, chest congestion, shortness of breath and generalized weakness since 03/14/2018. Ex-smoker. History of asthma. EXAM: CHEST  1 VIEW COMPARISON:  03/18/2018. FINDINGS: Poor inspiration with some improvement. Mildly progressive bibasilar linear scarring and possible atelectasis. Otherwise, clear lungs. No pleural fluid. Grossly normal sized heart, magnified by the poor inspiration and portable AP technique. Unremarkable bones. IMPRESSION: Poor inspiration with mildly progressive bibasilar linear scarring and possible atelectasis. Electronically Signed   By: Claudie Revering M.D.   On: 03/20/2018 21:00   Ct Angio Chest Pe W Or Wo Contrast  Result Date: 03/21/2018 CLINICAL DATA:  Cough, chest congestion, weakness and fever beginning 1 week ago. Poor oxygen saturation. EXAM: CT ANGIOGRAPHY CHEST WITH CONTRAST TECHNIQUE: Multidetector CT imaging of the chest was performed using the standard protocol during bolus  administration of intravenous contrast. Multiplanar CT image reconstructions and MIPs were obtained to evaluate the vascular anatomy. CONTRAST:  151m OMNIPAQUE IOHEXOL 350 MG/ML SOLN COMPARISON:  Chest radiography yesterday.  Chest CT 08/26/2017 FINDINGS: Cardiovascular: Pulmonary arterial opacification is moderate to good. There are no visible pulmonary emboli. No aortic atherosclerosis. No coronary artery calcification. Heart size is normal. Mediastinum/Nodes: No mediastinal mass or lymphadenopathy. Lungs/Pleura: There is bilateral atelectatic pneumonia, lower lobe predominant. No evidence of ground-glass opacity to specifically suggest viral pneumonitis. No evidence of underlying mass lesion. No pleural effusion. Upper Abdomen: Normal.  Previous cholecystectomy. Musculoskeletal: Normal Review of the MIP images confirms the above findings. IMPRESSION: No pulmonary emboli. Bilateral patchy mid to lower lung atelectasis/infiltrate consistent with atelectatic pneumonia. No finding specific for viral pneumonitis. Electronically Signed   By: MNelson ChimesM.D.   On: 03/21/2018 10:46   Dg Chest Portable 1 View  Result Date: 03/18/2018 CLINICAL DATA:  Short of breath EXAM: PORTABLE CHEST 1 VIEW COMPARISON:  08/30/2017 FINDINGS: Hypoventilation. Patchy bibasilar airspace disease, with mild improvement on the right since the prior study. Negative for edema or effusion. IMPRESSION: Hypoventilation with bibasilar atelectasis. Electronically Signed   By: CFranchot GalloM.D.   On: 03/18/2018 13:29    DOrson Eva DO  [facility] Pager [phone] If 7PM-7AM, please contact night-coverage [email or web address] Password [email or web address] 03/23/2018, 4:30 PM   LOS: 5 days

## 2018-03-24 DIAGNOSIS — R001 Bradycardia, unspecified: Secondary | ICD-10-CM

## 2018-03-24 DIAGNOSIS — G4733 Obstructive sleep apnea (adult) (pediatric): Secondary | ICD-10-CM

## 2018-03-24 DIAGNOSIS — I10 Essential (primary) hypertension: Secondary | ICD-10-CM

## 2018-03-24 DIAGNOSIS — Z9989 Dependence on other enabling machines and devices: Secondary | ICD-10-CM

## 2018-03-24 LAB — COMPREHENSIVE METABOLIC PANEL
ALT: 127 U/L — ABNORMAL HIGH (ref 0–44)
AST: 46 U/L — ABNORMAL HIGH (ref 15–41)
Albumin: 3 g/dL — ABNORMAL LOW (ref 3.5–5.0)
Alkaline Phosphatase: 88 U/L (ref 38–126)
Anion gap: 9 (ref 5–15)
BUN: 21 mg/dL — ABNORMAL HIGH (ref 6–20)
CO2: 26 mmol/L (ref 22–32)
CREATININE: 0.75 mg/dL (ref 0.44–1.00)
Calcium: 8.4 mg/dL — ABNORMAL LOW (ref 8.9–10.3)
Chloride: 97 mmol/L — ABNORMAL LOW (ref 98–111)
GFR calc Af Amer: >60 mL/min (ref >60)
GFR calc non Af Amer: >60 mL/min (ref >60)
Glucose, Bld: 254 mg/dL — ABNORMAL HIGH (ref 70–99)
Potassium: 4 mmol/L (ref 3.5–5.1)
Sodium: 132 mmol/L — ABNORMAL LOW (ref 135–145)
Total Bilirubin: 0.6 mg/dL (ref 0.3–1.2)
Total Protein: 5.9 g/dL — ABNORMAL LOW (ref 6.5–8.1)

## 2018-03-24 LAB — GLUCOSE, CAPILLARY
Glucose-Capillary: 174 mg/dL — ABNORMAL HIGH (ref 70–99)
Glucose-Capillary: 200 mg/dL — ABNORMAL HIGH (ref 70–99)

## 2018-03-24 MED ORDER — PREDNISONE 10 MG PO TABS: 60.0000 mg | ORAL_TABLET | Freq: Every day | ORAL | 0 refills | Status: DC

## 2018-03-24 NOTE — Discharge Summary (Signed)
Physician Discharge Summary  Cheryl Black PIR:[id number] DOB: Jul 24, 1985 DOA: 03/18/2018  PCP: [name], MD  Admit date: 03/18/2018 Discharge date: 03/24/2018  Admitted From:Home Disposition:  Home   Recommendations for Outpatient Follow-up:  1. Follow up with PCP in 1-2 weeks 2. Please obtain BMP/CBC in one week   Home Health: YES Equipment/Devices: 4 L [place]  Discharge Condition: Stable CODE STATUS: FULL Diet recommendation: Heart Healthy / Carb Modified   Brief/Interim Summary: 33 y.o.femalewith medical history ofdiabetes mellitus type 2, PCOS, hyperlipidemia, obesity, tobacco abuse presenting with cough, congestion, and generalized weakness that began on 03/14/2018. The patient had been running fevers up to 101.0 F at home. She went to see her primary care provider on 03/15/2018 and was started on Augmentin and doxycycline. The patient had taken 2 doses of Augmentin. However, when she took the doxycycline, she developed a rash on her hands. She stated that her breathing and coughing and chest congestion did not improve. She went back to see her primary care provider on 03/18/2018. The patient was given a prescription for levofloxacin, but her breathing was worsening. As result, the patient was sent to emergency department for further evaluation. The patient denies any recent travels outside of [place]. However, she works as an Therapist, sports in the emergency department. Her last shift at work was on 03/10/2018. The patient lives at home with her spouse who was recently discharged from the hospital on 03/12/2018 after a stay for treatment for COPD exacerbation. He also smokes, but he has not traveled outside [place] in the past month. The patient has not had any sick close contacts. Patient has had some chest tightness with her coughing but she denies any nausea, vomiting, diarrhea, abdominal pain, headache, sore throat. There is no abdominal pain, dysuria, hematuria. She  has been using her nebulizer machine at home without much improvement. In the emergency department, the patient developed some respiratory distress. She was placed on BiPAP. She was given bronchodilators and the Brethine. She was started on Solu-Medrol and levofloxacin. The patient was admitted for further evaluationand treatment.  Her antibiotics were discontinued as the clinical picture was not consistent bacterial infection.  She gradually improved albeit slow.  Pulmonary medicine, Dr. Luan Pulling was consulted.  He started pt back on cefepime of which she had 3 days.  Ultimately, her oxygen was weaned down to 4L with which she will go home.     Discharge Diagnoses:  Acute Respiratory Failure with Hypoxia -due to bronchospasm, likely due to underlying viral infection -pt does not have diagnosis of asthma or COPD -Patient had seen pulmonology, Dr. Melvyn Novas on 09/24/2016--PFTs did not suggest COPD or asthma -?reactive airway disease -stable on BiPAP>>>weaned to10L HFNC>>>5L -PCT <0.10 -3/15 CTA chest--no PE, no GOO, bibasilar atelectatic PNA -cefepime per pulmonary--pt had 3 days -d/c vancomycin--MRSA screening neg -ambulatory pulse ox on RA showed desaturation <88%-->d/c home with 4 L [place]  Acute Bronchospasm/reactive airway disease -personally reviewed CXR--interstitial coarsening without consolidation -Viral respiratory panel-->+rhinovirus/enterovirus -Influenza rapid antigen test was negative in the office -ContinuePulmicort -ContinueXopenex -ContinueSolu-Medrol>>>wean to prednisone 3/18 -Continue loratadine -added Brovana -d/c home with prednisone taper over 6 days -follow up Dr. Melvyn Novas after d/c  Diabetes mellitus type 2, uncontrolled with hyperglycemia -Hemoglobin A1c--8.9 -IncreaseLantus70 units daily -Resistant NovoLog sliding scale -Holding Kombiglyze--restart after d/c -increasenovolog 15units with meals  Nocturnal Bradycardia -HR into 40s -no AV block or  pauses -likely due to OHS/OSA -cardiology consult appreciated-->no further intervention -continue CPAP  Morbid obesity -BMI 45.61 -Lifestyle  modification  PCOS -Patient follows with Dr. [name] -No longer onProvera  Transaminasemia -due to hepatic steatosis (noted of prior CTs) -no abd pain; tolerating diet -hep B surface antigen--pending -hep C antibody--pending -overall stable -outpt follow up  Tobacco Abuse -I have discussed tobacco cessation with the patient. I have counseled the patient regarding the negative impacts of continued tobacco use including but not limited to lung cancer, COPD, and cardiovascular disease. I have discussed alternatives to tobacco and modalities that may help facilitate tobacco cessation including but not limited to biofeedback, hypnosis, and medications. Total time spent with tobacco counseling was 4 minutes.    Discharge Instructions   Allergies as of 03/24/2018      Reactions   Ace Inhibitors Anaphylaxis   Beta Adrenergic Blockers Other (See Comments)   Angioedema   Augmentin [amoxicillin-pot Clavulanate] Other (See Comments)   Has had C.diff in the past   Benicar [olmesartan] Other (See Comments)   Facial edema   Doxycycline Other (See Comments)   Makes feet and hands red   Eggs Or Egg-derived Products    Local reaction with injectable meds that contain eggs   Influenza Vaccine Live Swelling   Arm    Invokana [canagliflozin] Other (See Comments)   Dehydration      Medication List    STOP taking these medications   levofloxacin 750 MG tablet Commonly known as:  LEVAQUIN     TAKE these medications   acetaminophen 500 MG tablet Commonly known as:  TYLENOL Take 1,000 mg by mouth every 6 (six) hours as needed.   albuterol (5 MG/ML) 0.5% nebulizer solution Commonly known as:  PROVENTIL Take 0.5 mLs (2.5 mg total) by nebulization every 6 (six) hours as needed for wheezing or shortness of breath.   albuterol 108 (90  Base) MCG/ACT inhaler Commonly known as:  PROVENTIL HFA;VENTOLIN HFA Inhale 2 puffs into the lungs every 6 (six) hours as needed for wheezing or shortness of breath.   allopurinol 300 MG tablet Commonly known as:  ZYLOPRIM Take 300 mg by mouth every evening.   butalbital-acetaminophen-caffeine 50-325-40 MG tablet Commonly known as:  FIORICET, ESGIC Take 1 tablet by mouth once a week.   Calcium Carb-Cholecalciferol [phone] MG-UNIT Caps Take 2 capsules by mouth every evening.   fexofenadine 180 MG tablet Commonly known as:  ALLEGRA Take 180 mg by mouth every evening.   fluticasone 50 MCG/ACT nasal spray Commonly known as:  FLONASE Place 2 sprays into both nostrils daily.   hydrochlorothiazide 12.5 MG capsule Commonly known as:  MICROZIDE Take 1 capsule (12.5 mg total) by mouth daily.   ibuprofen 800 MG tablet Commonly known as:  ADVIL,MOTRIN Take 800 mg by mouth every 8 (eight) hours as needed.   insulin lispro 100 UNIT/ML injection Commonly known as:  HUMALOG Inject 10-22 Units into the skin 4 (four) times daily. Sliding scale 4 times a day   ipratropium 0.02 % nebulizer solution Commonly known as:  ATROVENT Take 0.5 mg by nebulization every 4 (four) hours as needed for wheezing or shortness of breath.   Kombiglyze XR 2.05-998 MG Tb24 Generic drug:  Saxagliptin-Metformin Take 1 tablet by mouth daily.   pantoprazole 40 MG tablet Commonly known as:  PROTONIX Take 1 tablet (40 mg total) by mouth daily.   predniSONE 10 MG tablet Commonly known as:  DELTASONE Take 6 tablets (60 mg total) by mouth daily with breakfast. And decrease by one tablet daily. Start taking on:  March 25, 2018   [place]a 100-33  UNT-MCG/ML Sopn Generic drug:  Insulin Glargine-Lixisenatide Inject 60 Units into the skin at bedtime.   Tums Chewy Bites 750 MG chewable tablet Generic drug:  calcium carbonate Chew 1 tablet by mouth as needed for heartburn.            Durable Medical  Equipment  (From admission, onward)         Start     Ordered   03/24/18 1049  For home use only DME oxygen  Once    Question Answer Comment  Mode or (Route) Nasal cannula   Liters per Minute 4   Frequency Continuous (stationary and portable oxygen unit needed)   Oxygen conserving device Yes   Oxygen delivery system Gas      03/24/18 1048          Allergies  Allergen Reactions   Ace Inhibitors Anaphylaxis   Beta Adrenergic Blockers Other (See Comments)    Angioedema    Augmentin [Amoxicillin-Pot Clavulanate] Other (See Comments)    Has had C.diff in the past   Benicar [Olmesartan] Other (See Comments)    Facial edema   Doxycycline Other (See Comments)    Makes feet and hands red   Eggs Or Egg-Derived Products     Local reaction with injectable meds that contain eggs   Influenza Vaccine Live Swelling    Arm    Invokana [Canagliflozin] Other (See Comments)    Dehydration     Consultations:  Pulmonary  cardiology   Procedures/Studies: Dg Chest 1 View  Result Date: 03/20/2018 CLINICAL DATA:  Cough, chest congestion, shortness of breath and generalized weakness since 03/14/2018. Ex-smoker. History of asthma. EXAM: CHEST  1 VIEW COMPARISON:  03/18/2018. FINDINGS: Poor inspiration with some improvement. Mildly progressive bibasilar linear scarring and possible atelectasis. Otherwise, clear lungs. No pleural fluid. Grossly normal sized heart, magnified by the poor inspiration and portable AP technique. Unremarkable bones. IMPRESSION: Poor inspiration with mildly progressive bibasilar linear scarring and possible atelectasis. Electronically Signed   By: Claudie Revering M.D.   On: 03/20/2018 21:00   Ct Angio Chest Pe W Or Wo Contrast  Result Date: 03/21/2018 CLINICAL DATA:  Cough, chest congestion, weakness and fever beginning 1 week ago. Poor oxygen saturation. EXAM: CT ANGIOGRAPHY CHEST WITH CONTRAST TECHNIQUE: Multidetector CT imaging of the chest was performed  using the standard protocol during bolus administration of intravenous contrast. Multiplanar CT image reconstructions and MIPs were obtained to evaluate the vascular anatomy. CONTRAST:  168m OMNIPAQUE IOHEXOL 350 MG/ML SOLN COMPARISON:  Chest radiography yesterday.  Chest CT 08/26/2017 FINDINGS: Cardiovascular: Pulmonary arterial opacification is moderate to good. There are no visible pulmonary emboli. No aortic atherosclerosis. No coronary artery calcification. Heart size is normal. Mediastinum/Nodes: No mediastinal mass or lymphadenopathy. Lungs/Pleura: There is bilateral atelectatic pneumonia, lower lobe predominant. No evidence of ground-glass opacity to specifically suggest viral pneumonitis. No evidence of underlying mass lesion. No pleural effusion. Upper Abdomen: Normal.  Previous cholecystectomy. Musculoskeletal: Normal Review of the MIP images confirms the above findings. IMPRESSION: No pulmonary emboli. Bilateral patchy mid to lower lung atelectasis/infiltrate consistent with atelectatic pneumonia. No finding specific for viral pneumonitis. Electronically Signed   By: MNelson ChimesM.D.   On: 03/21/2018 10:46   Dg Chest Portable 1 View  Result Date: 03/18/2018 CLINICAL DATA:  Short of breath EXAM: PORTABLE CHEST 1 VIEW COMPARISON:  08/30/2017 FINDINGS: Hypoventilation. Patchy bibasilar airspace disease, with mild improvement on the right since the prior study. Negative for edema or effusion. IMPRESSION: Hypoventilation with bibasilar  atelectasis. Electronically Signed   By: Franchot Gallo M.D.   On: 03/18/2018 13:29        Discharge Exam: Vitals:   03/24/18 0751 03/24/18 0800  BP:  123/89  Pulse:  (!) 59  Resp:    Temp:    SpO2: 95% 99%   Vitals:   03/24/18 0600 03/24/18 0700 03/24/18 0751 03/24/18 0800  BP: 132/86   123/89  Pulse: (!) 55   (!) 59  Resp:      Temp:  97.8 F (36.6 C)    TempSrc:  Oral    SpO2: 98%  95% 99%  Weight:      Height:        General: Pt is alert,  awake, not in acute distress Cardiovascular: RRR, S1/S2 +, no rubs, no gallops Respiratory: CTA bilaterally, no wheezing, no rhonchi Abdominal: Soft, NT, ND, bowel sounds + Extremities: no edema, no cyanosis   The results of significant diagnostics from this hospitalization (including imaging, microbiology, ancillary and laboratory) are listed below for reference.    Significant Diagnostic Studies: Dg Chest 1 View  Result Date: 03/20/2018 CLINICAL DATA:  Cough, chest congestion, shortness of breath and generalized weakness since 03/14/2018. Ex-smoker. History of asthma. EXAM: CHEST  1 VIEW COMPARISON:  03/18/2018. FINDINGS: Poor inspiration with some improvement. Mildly progressive bibasilar linear scarring and possible atelectasis. Otherwise, clear lungs. No pleural fluid. Grossly normal sized heart, magnified by the poor inspiration and portable AP technique. Unremarkable bones. IMPRESSION: Poor inspiration with mildly progressive bibasilar linear scarring and possible atelectasis. Electronically Signed   By: Claudie Revering M.D.   On: 03/20/2018 21:00   Ct Angio Chest Pe W Or Wo Contrast  Result Date: 03/21/2018 CLINICAL DATA:  Cough, chest congestion, weakness and fever beginning 1 week ago. Poor oxygen saturation. EXAM: CT ANGIOGRAPHY CHEST WITH CONTRAST TECHNIQUE: Multidetector CT imaging of the chest was performed using the standard protocol during bolus administration of intravenous contrast. Multiplanar CT image reconstructions and MIPs were obtained to evaluate the vascular anatomy. CONTRAST:  121m OMNIPAQUE IOHEXOL 350 MG/ML SOLN COMPARISON:  Chest radiography yesterday.  Chest CT 08/26/2017 FINDINGS: Cardiovascular: Pulmonary arterial opacification is moderate to good. There are no visible pulmonary emboli. No aortic atherosclerosis. No coronary artery calcification. Heart size is normal. Mediastinum/Nodes: No mediastinal mass or lymphadenopathy. Lungs/Pleura: There is bilateral atelectatic  pneumonia, lower lobe predominant. No evidence of ground-glass opacity to specifically suggest viral pneumonitis. No evidence of underlying mass lesion. No pleural effusion. Upper Abdomen: Normal.  Previous cholecystectomy. Musculoskeletal: Normal Review of the MIP images confirms the above findings. IMPRESSION: No pulmonary emboli. Bilateral patchy mid to lower lung atelectasis/infiltrate consistent with atelectatic pneumonia. No finding specific for viral pneumonitis. Electronically Signed   By: MNelson ChimesM.D.   On: 03/21/2018 10:46   Dg Chest Portable 1 View  Result Date: 03/18/2018 CLINICAL DATA:  Short of breath EXAM: PORTABLE CHEST 1 VIEW COMPARISON:  08/30/2017 FINDINGS: Hypoventilation. Patchy bibasilar airspace disease, with mild improvement on the right since the prior study. Negative for edema or effusion. IMPRESSION: Hypoventilation with bibasilar atelectasis. Electronically Signed   By: CFranchot GalloM.D.   On: 03/18/2018 13:29     Microbiology: Recent Results (from the past 240 hour(s))  Blood culture (routine x 2)     Status: None   Collection Time: 03/18/18 11:50 AM  Result Value Ref Range Status   Specimen Description BLOOD RIGHT HAND DRAWN BY RN  Final   Special Requests   Final  BOTTLES DRAWN AEROBIC AND ANAEROBIC Blood Culture results may not be optimal due to an inadequate volume of blood received in culture bottles   Culture   Final    NO GROWTH 5 DAYS Performed at [facility], [address], [place], [place] [address]    Report Status 03/23/2018 FINAL  Final  Blood culture (routine x 2)     Status: None   Collection Time: 03/18/18 11:50 AM  Result Value Ref Range Status   Specimen Description BLOOD RIGHT ARM DRAWN BY RN  Final   Special Requests   Final    BOTTLES DRAWN AEROBIC AND ANAEROBIC Blood Culture adequate volume   Culture   Final    NO GROWTH 5 DAYS Performed at [facility], [address] [place], [place] [address]    Report Status 03/23/2018  FINAL  Final  Respiratory Panel by PCR     Status: Abnormal   Collection Time: 03/18/18  3:27 PM  Result Value Ref Range Status   Adenovirus NOT DETECTED NOT DETECTED Final   Coronavirus 229E NOT DETECTED NOT DETECTED Final    Comment: (NOTE) The Coronavirus on the Respiratory Panel, DOES NOT test for the novel  Coronavirus (2019 nCoV)    Coronavirus HKU1 NOT DETECTED NOT DETECTED Final   Coronavirus NL63 NOT DETECTED NOT DETECTED Final   Coronavirus OC43 NOT DETECTED NOT DETECTED Final   Metapneumovirus NOT DETECTED NOT DETECTED Final   Rhinovirus / Enterovirus DETECTED (A) NOT DETECTED Final   Influenza A NOT DETECTED NOT DETECTED Final   Influenza B NOT DETECTED NOT DETECTED Final   Parainfluenza Virus 1 NOT DETECTED NOT DETECTED Final   Parainfluenza Virus 2 NOT DETECTED NOT DETECTED Final   Parainfluenza Virus 3 NOT DETECTED NOT DETECTED Final   Parainfluenza Virus 4 NOT DETECTED NOT DETECTED Final   Respiratory Syncytial Virus NOT DETECTED NOT DETECTED Final   Bordetella pertussis NOT DETECTED NOT DETECTED Final   Chlamydophila pneumoniae NOT DETECTED NOT DETECTED Final   Mycoplasma pneumoniae NOT DETECTED NOT DETECTED Final    Comment: Performed at [facility], [place] [address], [place], [place] [address]  MRSA PCR Screening     Status: None   Collection Time: 03/18/18  3:27 PM  Result Value Ref Range Status   MRSA by PCR NEGATIVE NEGATIVE Final    Comment:        The GeneXpert MRSA Assay (FDA approved for NASAL specimens only), is one component of a comprehensive MRSA colonization surveillance program. It is not intended to diagnose MRSA infection nor to guide or monitor treatment for MRSA infections. Performed at [facility], [address] [place], [place] [address]      Labs: Basic Metabolic Panel: Recent Labs  Lab 03/18/18 1150 03/19/18 0436 03/21/18 0503 03/23/18 0511 03/24/18 0408  NA 136 135 135 135 132*  K 3.6 4.2 4.1 4.1 4.0  CL 107 108  101 100 97*  CO2 18* 20* 25 27 26   GLUCOSE 255* 276* 278* 249* 254*  BUN 8 11 21* 19 21*  CREATININE 0.57 0.59 0.58 0.60 0.75  CALCIUM 8.7* 8.7* 8.4* 8.3* 8.4*  MG  --   --   --  2.1  --    Liver Function Tests: Recent Labs  Lab 03/23/18 0511 03/24/18 0408  AST 51* 46*  ALT 110* 127*  ALKPHOS 70 88  BILITOT 0.4 0.6  PROT 6.0* 5.9*  ALBUMIN 3.0* 3.0*   No results for input(s): LIPASE, AMYLASE in the  last 168 hours. No results for input(s): AMMONIA in the last 168 hours. CBC: Recent Labs  Lab 03/18/18 1150 03/19/18 0436 03/21/18 0503  WBC 8.2 13.9* 16.7*  NEUTROABS 5.5  --   --   HGB 14.4 13.8 13.7  HCT 44.2 42.7 42.7  MCV 88.6 89.9 91.0  PLT 238 238 256   Cardiac Enzymes: No results for input(s): CKTOTAL, CKMB, CKMBINDEX, TROPONINI in the last 168 hours. BNP: Invalid input(s): POCBNP CBG: Recent Labs  Lab 03/23/18 1309 03/23/18 1546 03/23/18 2109 03/24/18 0739 03/24/18 1130  GLUCAP 327* 273* 279* 174* 200*    Time coordinating discharge:  36 minutes  Signed:  Orson Eva, DO [facility] Pager: [phone] 03/24/2018, 1:40 PM

## 2018-03-24 NOTE — Progress Notes (Signed)
Patient received discharge orders and verbalized understanding. 2 peripheral IVs removed prior to discharge. Patient to be discharged home with husband with home oxygen.

## 2018-03-24 NOTE — Consult Note (Addendum)
Cardiology Consult    Black ID: EVONA WESTRA; [id number]; [birth year]   Admit date: 03/18/2018 Date of Consult: 03/24/2018  Primary Care Provider: [name], MD Primary Cardiologist: New to [facility] - Dr. Bronson Ing  Black Profile    Cheryl Black is a 33 y.o. female with past medical history of Type 2 DM, Asthma, OSA, obesity and tobacco use who is being seen today for Cheryl evaluation of nocturnal bradycardia at Cheryl request of Dr. Carles Collet.   History of Present Illness    Cheryl Black presented to [facility] ED on 03/18/2018 for evaluation of worsening dyspnea and fevers for Cheryl past several days despite being prescribed antibiotics by her PCP. Cheryl Black was admitted for acute hypoxic respiratory failure in Cheryl setting of bronchospasm and likely viral infection. Cheryl Black required BiPAP but has been weaned down to nasal cannula (currently on 4.5L). Her viral panel was positive for rhinovirus and enterovirus. Was treated with scheduled nebulizers, antibiotics and IV steroids. CTA performed on 03/21/2018 showed no evidence of pulmonary emboli but did have bilateral patchy mid to lower lung atelectasis/infiltrate consistent with atelectasis or PNA. Pulmonology has been following and feels Cheryl Black is stable for discharge today.   In talking with Cheryl Black today, Cheryl Black feels that her respiratory status has significantly improved. Cheryl Black denies any chest pain or palpitations. No recent orthopnea or PND. Cheryl Black was experiencing intermittent lower extremity edema prior to admission but says this has now resolved. Early this morning, around 0530, Cheryl Black says Cheryl Black was awoke due to her cardiac monitor going off and saying her heart rate was in Cheryl 30's at that time.  Cheryl Black reviewed her telemetry from Cheryl past 48 hours and could only see where Cheryl heart rate was in Cheryl mid-40's early this morning. Cheryl Black did have occasional PAC's at that time but no significant arrhythmias or significant pauses. Has been in NSR since with rates in Cheryl mid-50's  to 70's. Denies any symptoms at that time as Cheryl Black reports Cheryl Black had been resting and her CPAP was on.  Cheryl Black says that her resting heart rate is typically in Cheryl 70's and can go to in Cheryl low 100's with activity. Cheryl Black does not take any AV nodal blocking agents.  Electrolytes within normal limits this morning and TSH was stable at 2.2 when checked by her PCP in 12/2017.  Reports being diagnosed with sleep apnea last year and has been compliant with CPAP since.   Past Medical History:  Diagnosis Date   Acid reflux    Asthma    Clostridium difficile infection    in Cheryl past   Diabetes mellitus    Fatty liver    Fibrocystic breast disease    Gout    Hidradenitis    History of kidney stones    Hyperlipemia    Obesity    Polycystic ovary syndrome    PONV (postoperative nausea and vomiting)    Smoker     Past Surgical History:  Procedure Laterality Date   CHOLECYSTECTOMY  01/10/2011   Procedure: LAPAROSCOPIC CHOLECYSTECTOMY;  Surgeon: Jamesetta So;  Location: [facility];  Service: General;  Laterality: N/A;   HYDRADENITIS EXCISION Right 06/18/2012   Procedure: EXCISION HYDRIADENITIS SUPRATIVA  RIGHT AXILLA;  Surgeon: Scherry Ran, MD;  Location: [facility];  Service: General;  Laterality: Right;   LIVER BIOPSY  01/10/2011   Procedure: LIVER BIOPSY;  Surgeon: Jamesetta So;  Location: [facility];  Service: General;;   SKIN SPLIT GRAFT Right  06/18/2012   Procedure: SKIN GRAFT SPLIT THICKNESS RIGHT AXILLA(WITH TWO STANDARD SKIN BOARDS 3"x 8" )  DONOR SITE RIGHT AND LEFT THIGHS;  Surgeon: Scherry Ran, MD;  Location: [facility];  Service: General;  Laterality: Right;     Home Medications:  Prior to Admission medications   Medication Sig Start Date End Date Taking? Authorizing Provider  acetaminophen (TYLENOL) 500 MG tablet Take 1,000 mg by mouth every 6 (six) hours as needed.   Yes [provider]  albuterol (PROVENTIL HFA;VENTOLIN HFA) 108 (90 Base) MCG/ACT inhaler Inhale  2 puffs into Cheryl lungs every 6 (six) hours as needed for wheezing or shortness of breath. 08/31/17  Yes Isaac Bliss, Rayford Halsted, MD  albuterol (PROVENTIL) (5 MG/ML) 0.5% nebulizer solution Take 0.5 mLs (2.5 mg total) by nebulization every 6 (six) hours as needed for wheezing or shortness of breath. 08/23/17  Yes Jacqlyn Larsen, PA-C  allopurinol (ZYLOPRIM) 300 MG tablet Take 300 mg by mouth every evening.    Yes [provider]  butalbital-acetaminophen-caffeine (FIORICET, ESGIC) 50-325-40 MG tablet Take 1 tablet by mouth once a week.  01/21/18  Yes [provider]  Calcium Carb-Cholecalciferol [id number] MG-UNIT CAPS Take 2 capsules by mouth every evening.   Yes [provider]  calcium carbonate (TUMS CHEWY BITES) 750 MG chewable tablet Chew 1 tablet by mouth as needed for heartburn.   Yes [provider]  fexofenadine (ALLEGRA) 180 MG tablet Take 180 mg by mouth every evening.   Yes [provider]  fluticasone (FLONASE) 50 MCG/ACT nasal spray Place 2 sprays into both nostrils daily.    Yes [provider]  hydrochlorothiazide (MICROZIDE) 12.5 MG capsule Take 1 capsule (12.5 mg total) by mouth daily. 02/17/18  Yes Jonnie Kind, MD  ibuprofen (ADVIL,MOTRIN) 800 MG tablet Take 800 mg by mouth every 8 (eight) hours as needed.   Yes [provider]  Insulin Glargine-Lixisenatide (SOLIQUA) 100-33 UNT-MCG/ML SOPN Inject 60 Units into Cheryl skin at bedtime.    Yes [provider]  insulin lispro (HUMALOG) 100 UNIT/ML injection Inject 10-22 Units into Cheryl skin 4 (four) times daily. Sliding scale 4 times a day   Yes [provider]  ipratropium (ATROVENT) 0.02 % nebulizer solution Take 0.5 mg by nebulization every 4 (four) hours as needed for wheezing or shortness of breath.   Yes [provider]  KOMBIGLYZE XR 2.05-998 MG TB24 Take 1 tablet by mouth daily.  02/13/16  Yes [provider]  levofloxacin (LEVAQUIN)  750 MG tablet Take 750 mg by mouth daily.  03/17/18  Yes [provider]  pantoprazole (PROTONIX) 40 MG tablet Take 1 tablet (40 mg total) by mouth daily. 09/01/17   Isaac Bliss, Rayford Halsted, MD    Inpatient Medications: Scheduled Meds:  allopurinol  300 mg Oral QPM   arformoterol  15 mcg Nebulization BID   budesonide (PULMICORT) nebulizer solution  0.5 mg Nebulization BID   calcium carbonate  1 tablet Oral BID WC   And   cholecalciferol  1,000 Units Oral BID WC   chlorhexidine  15 mL Mouth Rinse BID   Chlorhexidine Gluconate Cloth  6 each Topical Daily   enoxaparin (LOVENOX) injection  70 mg Subcutaneous Q24H   fluticasone  2 spray Each Nare QPM   guaiFENesin  1,200 mg Oral BID   insulin aspart  0-20 Units Subcutaneous TID WC   insulin aspart  0-5 Units Subcutaneous QHS   insulin aspart  15 Units Subcutaneous TID  WC   insulin glargine  70 Units Subcutaneous QHS   ipratropium  0.5 mg Nebulization Q6H   levalbuterol  0.63 mg Nebulization Q6H   loratadine  10 mg Oral Daily   mouth rinse  15 mL Mouth Rinse q12n4p   pantoprazole  40 mg Oral Daily   predniSONE  60 mg Oral Q breakfast   Continuous Infusions:  sodium chloride 10 mL/hr at 03/24/18 0618   albuterol 15 mg/hr (03/18/18 1151)   ceFEPime (MAXIPIME) IV 2 g (03/24/18 0618)   PRN Meds: sodium chloride, acetaminophen **OR** acetaminophen, benzonatate, ibuprofen, levalbuterol, ondansetron **OR** ondansetron (ZOFRAN) IV  Allergies:    Allergies  Allergen Reactions   Ace Inhibitors Anaphylaxis   Beta Adrenergic Blockers Other (See Comments)    Angioedema    Augmentin [Amoxicillin-Pot Clavulanate] Other (See Comments)    Has had C.diff in Cheryl past   Benicar [Olmesartan] Other (See Comments)    Facial edema   Doxycycline Other (See Comments)    Makes feet and hands red   Eggs Or Egg-Derived Products     Local reaction with injectable meds that contain eggs   Influenza Vaccine Live  Swelling    Arm    Invokana [Canagliflozin] Other (See Comments)    Dehydration     Social History:   Social History   Socioeconomic History   Marital status: Married    Spouse name: Not on file   Number of children: Not on file   Years of education: Not on file   Highest education level: Not on file  Occupational History   Occupation: nurse  Social Needs   Financial resource strain: Not hard at all   Food insecurity:    Worry: Never true    Inability: Never true   Transportation needs:    Medical: No    Non-medical: No  Tobacco Use   Smoking status: Former Smoker    Packs/day: 1.00    Years: 10.00    Pack years: 10.00    Types: Cigarettes    Last attempt to quit: 08/23/2017    Years since quitting: 0.5   Smokeless tobacco: Never Used  Substance and Sexual Activity   Alcohol use: Yes    Comment: occas   Drug use: No   Sexual activity: Yes    Birth control/protection: None  Lifestyle   Physical activity:    Days per week: Not on file    Minutes per session: Not on file   Stress: Not on file  Relationships   Social connections:    Talks on phone: More than three times a week    Gets together: Once a week    Attends religious service: More than 4 times per year    Active member of club or organization: No    Attends meetings of clubs or organizations: Never    Relationship status: Married   Intimate partner violence:    Fear of current or ex partner: No    Emotionally abused: No    Physically abused: No    Forced sexual activity: No  Other Topics Concern   Not on file  Social History Narrative   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     Family History:    Family History  Problem Relation Age of Onset   Stroke Other        paternal great grandfather   Diabetes Father    Hypertension Father    Cancer Maternal Grandfather  adenocarcinoma   Emphysema Maternal Grandfather        smoked   Heart attack Paternal Grandfather     Hypertension Paternal Grandmother    Diabetes Paternal Grandmother    Gout Paternal Grandmother    Hypertension Maternal Grandmother    Other Mother        pre-cancerous cells; had hyst   Emphysema Mother        smoker   Gout Brother    Cancer Sister        cervical; had hyst   Asthma Maternal Uncle    Colon cancer Neg Hx    Liver disease Neg Hx    Liver cancer Neg Hx       Review of Systems    General:  No chills, fever, night sweats or weight changes.  Cardiovascular:  No chest pain, edema, orthopnea, palpitations, paroxysmal nocturnal dyspnea. Positive for dyspnea on exertion (now improved).  Dermatological: No rash, lesions/masses Respiratory: No cough, Positive for dyspnea. Urologic: No hematuria, dysuria Abdominal:   No nausea, vomiting, diarrhea, bright red blood per rectum, melena, or hematemesis Neurologic:  No visual changes, wkns, changes in mental status. All other systems reviewed and are otherwise negative except as noted above.  Physical Exam/Data    Vitals:   03/24/18 0500 03/24/18 0600 03/24/18 0700 03/24/18 0751  BP: (!) 135/92 132/86    Pulse: (!) 50 (!) 55    Resp:      Temp:   97.8 F (36.6 C)   TempSrc:   Oral   SpO2: 97% 98%  95%  Weight: 134.5 kg     Height:        Intake/Output Summary (Last 24 hours) at 03/24/2018 1132 Last data filed at 03/24/2018 0618 Gross per 24 hour  Intake 1088.11 ml  Output --  Net 1088.11 ml   Filed Weights   03/22/18 0500 03/23/18 0445 03/24/18 0500  Weight: 134.3 kg 134.3 kg 134.5 kg   Body mass index is 45.09 kg/m.   General: Pleasant, obese Caucasian female appearing in NAD Psych: Normal affect. Neuro: Alert and oriented X 3. Moves all extremities spontaneously. HEENT: Normal  Neck: Supple without bruits or JVD. Lungs:  Resp regular and unlabored, CTA without wheezing or rales. On 4.5 L [place].  Heart: RRR no s3, s4, or murmurs. Abdomen: Soft, non-tender, non-distended, BS + x 4.   Extremities: No clubbing, cyanosis or lower extremity edema. DP/PT/Radials 2+ and equal bilaterally.   Labs/Studies     Relevant CV Studies:  Echocardiogram: 08/2017 Study Conclusions  - Left ventricle: Cheryl cavity size was normal. Wall thickness was   increased in a pattern of mild LVH. Systolic function was normal.   Cheryl estimated ejection fraction was in Cheryl range of 60% to 65%.   Wall motion was normal; there were no regional wall motion   abnormalities. Left ventricular diastolic function parameters   were normal. - Aortic valve: Valve area (VTI): 3.12 cm^2. Valve area (Vmax):   3.31 cm^2. - Technically adequate study.  Laboratory Data:  Chemistry Recent Labs  Lab 03/21/18 0503 03/23/18 0511 03/24/18 0408  NA 135 135 132*  K 4.1 4.1 4.0  CL 101 100 97*  CO2 25 27 26   GLUCOSE 278* 249* 254*  BUN 21* 19 21*  CREATININE 0.58 0.60 0.75  CALCIUM 8.4* 8.3* 8.4*  GFRNONAA >60 >60 >60  GFRAA >60 >60 >60  ANIONGAP 9 8 9     Recent Labs  Lab 03/23/18 0511 03/24/18 0408  PROT 6.0* 5.9*  ALBUMIN 3.0* 3.0*  AST 51* 46*  ALT 110* 127*  ALKPHOS 70 88  BILITOT 0.4 0.6   Hematology Recent Labs  Lab 03/18/18 1150 03/19/18 0436 03/21/18 0503  WBC 8.2 13.9* 16.7*  RBC 4.99 4.75 4.69  HGB 14.4 13.8 13.7  HCT 44.2 42.7 42.7  MCV 88.6 89.9 91.0  MCH 28.9 29.1 29.2  MCHC 32.6 32.3 32.1  RDW 13.2 13.2 13.2  PLT 238 238 256   Cardiac EnzymesNo results for input(s): TROPONINI in Cheryl last 168 hours. No results for input(s): TROPIPOC in Cheryl last 168 hours.  BNP Recent Labs  Lab 03/18/18 1150 03/20/18 2152  BNP 38.0 129.0*    DDimer No results for input(s): DDIMER in Cheryl last 168 hours.  Radiology/Studies:  Dg Chest 1 View  Result Date: 03/20/2018 CLINICAL DATA:  Cough, chest congestion, shortness of breath and generalized weakness since 03/14/2018. Ex-smoker. History of asthma. EXAM: CHEST  1 VIEW COMPARISON:  03/18/2018. FINDINGS: Poor inspiration with some  improvement. Mildly progressive bibasilar linear scarring and possible atelectasis. Otherwise, clear lungs. No pleural fluid. Grossly normal sized heart, magnified by Cheryl poor inspiration and portable AP technique. Unremarkable bones. IMPRESSION: Poor inspiration with mildly progressive bibasilar linear scarring and possible atelectasis. Electronically Signed   By: Claudie Revering M.D.   On: 03/20/2018 21:00   Ct Angio Chest Pe W Or Wo Contrast  Result Date: 03/21/2018 CLINICAL DATA:  Cough, chest congestion, weakness and fever beginning 1 week ago. Poor oxygen saturation. EXAM: CT ANGIOGRAPHY CHEST WITH CONTRAST TECHNIQUE: Multidetector CT imaging of Cheryl chest was performed using Cheryl standard protocol during bolus administration of intravenous contrast. Multiplanar CT image reconstructions and MIPs were obtained to evaluate Cheryl vascular anatomy. CONTRAST:  132m OMNIPAQUE IOHEXOL 350 MG/ML SOLN COMPARISON:  Chest radiography yesterday.  Chest CT 08/26/2017 FINDINGS: Cardiovascular: Pulmonary arterial opacification is moderate to good. There are no visible pulmonary emboli. No aortic atherosclerosis. No coronary artery calcification. Heart size is normal. Mediastinum/Nodes: No mediastinal mass or lymphadenopathy. Lungs/Pleura: There is bilateral atelectatic pneumonia, lower lobe predominant. No evidence of ground-glass opacity to specifically suggest viral pneumonitis. No evidence of underlying mass lesion. No pleural effusion. Upper Abdomen: Normal.  Previous cholecystectomy. Musculoskeletal: Normal Review of Cheryl MIP images confirms Cheryl above findings. IMPRESSION: No pulmonary emboli. Bilateral patchy mid to lower lung atelectasis/infiltrate consistent with atelectatic pneumonia. No finding specific for viral pneumonitis. Electronically Signed   By: MNelson ChimesM.D.   On: 03/21/2018 10:46     Assessment & Plan    1. Nocturnal Bradycardia - Cheryl Black reports her HR was in Cheryl 30's at times last night but by  review of telemetry, I do not see any evidence of this or significant pauses. Cheryl lowest Cheryl Black am able to locate was in Cheryl mid-40's and Cheryl Black had a PAC at that time. Denies any associated symptoms as Cheryl Black was resting in bed.  - electrolytes and TSH recently checked and WNL.  - Cheryl Black has been in NSR since with rates stable in Cheryl mid-50's to 60's. Suspect her bradycardia overnight was likely secondary to obesity hypo-ventilation syndrome. Continued compliance with CPAP was encouraged to Cheryl Black. Continue to avoid AV nodal blocking agents. No indication for further cardiac monitoring at this time.   2. Acute Hypoxic Respiratory Failure - Viral Panel positive for Rhinovirus and Enterovirus.  - Was appropriately treated and has been weaned from BiPAP to 4.5 L [place]. Pulmonology previously following and has signed off.  3. HTN - BP has been stable at 96/55 - 145/103 within Cheryl past 24 hours. Plan to resume PTA HCTZ at discharge. Had angioedema with ACE-Cheryl Black in Cheryl past. Would not recommend Cheryl use of AV nodal blocking agents in Cheryl future for BP control given her baseline bradycardia  4. Morbid Obesity - BMI elevated to 45. Says Cheryl Black stays busy at work but is planning to increase her activity level once her respiratory status returns to baseline.    5. OSA - continued compliance with CPAP encouraged.   For questions or updates, please contact [facility] Please consult [email or web address] for contact info under Cardiology/STEMI.  Signed, Erma Heritage, PA-C 03/24/2018, 11:32 AM Pager: [phone]  Cheryl Black was seen and examined, and Cheryl Black agree with Cheryl history, physical exam, assessment and plan as documented above, with modifications as noted below. Cheryl Black have also personally reviewed all relevant documentation, old records, labs, and both radiographic and cardiovascular studies. Cheryl Black have also independently interpreted old and new ECG's.  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  Cheryl Black has been hospitalized for worsening shortness of breath and fevers and was found to be positive for enterovirus and rhinovirus.  Cheryl Black had acute hypoxic respiratory failure and was placed on BiPAP but is now on nasal cannula.  Cheryl Black is feeling much better.  We were consulted for nocturnal bradycardia.  Telemetry has been reviewed with Cheryl lowest heart rate in Cheryl mid 40 bpm range with a PAC.  There have been no significant pauses.  Cheryl Black is not on AV nodal blocking agents.  Cheryl Black is morbidly obese and has sleep apnea and uses CPAP and also has obesity hypoventilation syndrome.  Her nocturnal bradycardia is due to Cheryl summation of Cheryl aforementioned conditions.  Cheryl Black requires no further cardiac work-up.  Cheryl Black reassured her regarding this.   Kate Sable, MD, [facility]  03/24/2018 1:18 PM

## 2018-03-24 NOTE — Progress Notes (Signed)
Subjective: She says she feels better and wants to be considered for discharge. Only new problem is some asymptomatic bradycardia. Now on 4.5 liters oxygen. Used her own CPAP last night.   Objective: Vital signs in last 24 hours: Temp:  [97.3 F (36.3 C)-98.2 F (36.8 C)] 97.8 F (36.6 C) (03/18 0700) Pulse Rate:  [50-85] 55 (03/18 0600) Resp:  [14-34] 17 (03/18 0400) BP: (96-145)/(55-103) 132/86 (03/18 0600) SpO2:  [90 %-98 %] 98 % (03/18 0600) Weight:  [134.5 kg] 134.5 kg (03/18 0500) Weight change: 0.2 kg Last BM Date: 03/23/18  Intake/Output from previous day: 03/17 0701 - 03/18 0700 In: 1328.1 [P.O.:840; I.V.:188.1; IV Piggyback:300] Out: -   PHYSICAL EXAM General appearance: alert, cooperative, no distress and morbidly obese Resp: clear to auscultation bilaterally Cardio: regular rate and rhythm, S1, S2 normal, no murmur, click, rub or gallop GI: soft, non-tender; bowel sounds normal; no masses,  no organomegaly Extremities: extremities normal, atraumatic, no cyanosis or edema  Lab Results:  Results for orders placed or performed during the hospital encounter of 03/18/18 (from the past 48 hour(s))  Glucose, capillary     Status: Abnormal   Collection Time: 03/22/18  8:17 AM  Result Value Ref Range   Glucose-Capillary 373 (H) 70 - 99 mg/dL  Glucose, capillary     Status: Abnormal   Collection Time: 03/22/18 11:25 AM  Result Value Ref Range   Glucose-Capillary 340 (H) 70 - 99 mg/dL  Glucose, capillary     Status: Abnormal   Collection Time: 03/22/18  5:08 PM  Result Value Ref Range   Glucose-Capillary 219 (H) 70 - 99 mg/dL  Glucose, capillary     Status: Abnormal   Collection Time: 03/22/18  9:25 PM  Result Value Ref Range   Glucose-Capillary 383 (H) 70 - 99 mg/dL  Comprehensive metabolic panel     Status: Abnormal   Collection Time: 03/23/18  5:11 AM  Result Value Ref Range   Sodium 135 135 - 145 mmol/L   Potassium 4.1 3.5 - 5.1 mmol/L   Chloride 100 98 - 111  mmol/L   CO2 27 22 - 32 mmol/L   Glucose, Bld 249 (H) 70 - 99 mg/dL   BUN 19 6 - 20 mg/dL   Creatinine, Ser 0.60 0.44 - 1.00 mg/dL   Calcium 8.3 (L) 8.9 - 10.3 mg/dL   Total Protein 6.0 (L) 6.5 - 8.1 g/dL   Albumin 3.0 (L) 3.5 - 5.0 g/dL   AST 51 (H) 15 - 41 U/L   ALT 110 (H) 0 - 44 U/L   Alkaline Phosphatase 70 38 - 126 U/L   Total Bilirubin 0.4 0.3 - 1.2 mg/dL   GFR calc non Af Amer >60 >60 mL/min   GFR calc Af Amer >60 >60 mL/min   Anion gap 8 5 - 15    Comment: Performed at [facility], [address], [place], [place] [address]  Magnesium     Status: None   Collection Time: 03/23/18  5:11 AM  Result Value Ref Range   Magnesium 2.1 1.7 - 2.4 mg/dL    Comment: Performed at [facility], [address] [place], [place] [address]  Glucose, capillary     Status: Abnormal   Collection Time: 03/23/18  7:57 AM  Result Value Ref Range   Glucose-Capillary 226 (H) 70 - 99 mg/dL  Glucose, capillary     Status: Abnormal   Collection Time: 03/23/18  1:09 PM  Result Value Ref Range   Glucose-Capillary  327 (H) 70 - 99 mg/dL  Glucose, capillary     Status: Abnormal   Collection Time: 03/23/18  3:46 PM  Result Value Ref Range   Glucose-Capillary 273 (H) 70 - 99 mg/dL   Comment 1 Notify RN    Comment 2 Document in Chart   Glucose, capillary     Status: Abnormal   Collection Time: 03/23/18  9:09 PM  Result Value Ref Range   Glucose-Capillary 279 (H) 70 - 99 mg/dL  Comprehensive metabolic panel     Status: Abnormal   Collection Time: 03/24/18  4:08 AM  Result Value Ref Range   Sodium 132 (L) 135 - 145 mmol/L   Potassium 4.0 3.5 - 5.1 mmol/L   Chloride 97 (L) 98 - 111 mmol/L   CO2 26 22 - 32 mmol/L   Glucose, Bld 254 (H) 70 - 99 mg/dL   BUN 21 (H) 6 - 20 mg/dL   Creatinine, Ser 0.75 0.44 - 1.00 mg/dL   Calcium 8.4 (L) 8.9 - 10.3 mg/dL   Total Protein 5.9 (L) 6.5 - 8.1 g/dL   Albumin 3.0 (L) 3.5 - 5.0 g/dL   AST 46 (H) 15 - 41 U/L   ALT 127 (H) 0 - 44 U/L   Alkaline Phosphatase 88  38 - 126 U/L   Total Bilirubin 0.6 0.3 - 1.2 mg/dL   GFR calc non Af Amer >60 >60 mL/min   GFR calc Af Amer >60 >60 mL/min   Anion gap 9 5 - 15    Comment: Performed at [facility], [address], [place], [place] [address]  Glucose, capillary     Status: Abnormal   Collection Time: 03/24/18  7:39 AM  Result Value Ref Range   Glucose-Capillary 174 (H) 70 - 99 mg/dL    ABGS No results for input(s): PHART, PO2ART, TCO2, HCO3 in the last 72 hours.  Invalid input(s): PCO2 CULTURES Recent Results (from the past 240 hour(s))  Blood culture (routine x 2)     Status: None   Collection Time: 03/18/18 11:50 AM  Result Value Ref Range Status   Specimen Description BLOOD RIGHT HAND DRAWN BY RN  Final   Special Requests   Final    BOTTLES DRAWN AEROBIC AND ANAEROBIC Blood Culture results may not be optimal due to an inadequate volume of blood received in culture bottles   Culture   Final    NO GROWTH 5 DAYS Performed at [facility], [address], [place], [place] [address]    Report Status 03/23/2018 FINAL  Final  Blood culture (routine x 2)     Status: None   Collection Time: 03/18/18 11:50 AM  Result Value Ref Range Status   Specimen Description BLOOD RIGHT ARM DRAWN BY RN  Final   Special Requests   Final    BOTTLES DRAWN AEROBIC AND ANAEROBIC Blood Culture adequate volume   Culture   Final    NO GROWTH 5 DAYS Performed at [facility], [address] [place], [place] [address]    Report Status 03/23/2018 FINAL  Final  Respiratory Panel by PCR     Status: Abnormal   Collection Time: 03/18/18  3:27 PM  Result Value Ref Range Status   Adenovirus NOT DETECTED NOT DETECTED Final   Coronavirus 229E NOT DETECTED NOT DETECTED Final    Comment: (NOTE) The Coronavirus on the Respiratory Panel, DOES NOT test for the novel  Coronavirus (2019 nCoV)    Coronavirus HKU1 NOT DETECTED NOT DETECTED Final  Coronavirus NL63 NOT DETECTED NOT DETECTED Final   Coronavirus OC43 NOT DETECTED NOT  DETECTED Final   Metapneumovirus NOT DETECTED NOT DETECTED Final   Rhinovirus / Enterovirus DETECTED (A) NOT DETECTED Final   Influenza A NOT DETECTED NOT DETECTED Final   Influenza B NOT DETECTED NOT DETECTED Final   Parainfluenza Virus 1 NOT DETECTED NOT DETECTED Final   Parainfluenza Virus 2 NOT DETECTED NOT DETECTED Final   Parainfluenza Virus 3 NOT DETECTED NOT DETECTED Final   Parainfluenza Virus 4 NOT DETECTED NOT DETECTED Final   Respiratory Syncytial Virus NOT DETECTED NOT DETECTED Final   Bordetella pertussis NOT DETECTED NOT DETECTED Final   Chlamydophila pneumoniae NOT DETECTED NOT DETECTED Final   Mycoplasma pneumoniae NOT DETECTED NOT DETECTED Final    Comment: Performed at [facility], [place] [address], [place], [place] [address]  MRSA PCR Screening     Status: None   Collection Time: 03/18/18  3:27 PM  Result Value Ref Range Status   MRSA by PCR NEGATIVE NEGATIVE Final    Comment:        The GeneXpert MRSA Assay (FDA approved for NASAL specimens only), is one component of a comprehensive MRSA colonization surveillance program. It is not intended to diagnose MRSA infection nor to guide or monitor treatment for MRSA infections. Performed at [facility], [address], [place], [place] [address]    Studies/Results: No results found.  Medications:  Prior to Admission:  Medications Prior to Admission  Medication Sig Dispense Refill Last Dose  . acetaminophen (TYLENOL) 500 MG tablet Take 1,000 mg by mouth every 6 (six) hours as needed.   03/18/2018  . albuterol (PROVENTIL HFA;VENTOLIN HFA) 108 (90 Base) MCG/ACT inhaler Inhale 2 puffs into the lungs every 6 (six) hours as needed for wheezing or shortness of breath. 1 Inhaler 2 03/10/2018  . albuterol (PROVENTIL) (5 MG/ML) 0.5% nebulizer solution Take 0.5 mLs (2.5 mg total) by nebulization every 6 (six) hours as needed for wheezing or shortness of breath. 20 mL 0 03/17/2018  . allopurinol (ZYLOPRIM) 300 MG tablet  Take 300 mg by mouth every evening.    03/17/2018  . butalbital-acetaminophen-caffeine (FIORICET, ESGIC) 50-325-40 MG tablet Take 1 tablet by mouth once a week.    Past Month at Unknown time  . Calcium Carb-Cholecalciferol [phone] MG-UNIT CAPS Take 2 capsules by mouth every evening.   03/17/2018  . calcium carbonate (TUMS CHEWY BITES) 750 MG chewable tablet Chew 1 tablet by mouth as needed for heartburn.   unknown  . fexofenadine (ALLEGRA) 180 MG tablet Take 180 mg by mouth every evening.   03/17/2018  . fluticasone (FLONASE) 50 MCG/ACT nasal spray Place 2 sprays into both nostrils daily.    03/17/2018  . hydrochlorothiazide (MICROZIDE) 12.5 MG capsule Take 1 capsule (12.5 mg total) by mouth daily. 30 capsule 3 03/17/2018  . ibuprofen (ADVIL,MOTRIN) 800 MG tablet Take 800 mg by mouth every 8 (eight) hours as needed.   03/18/2018  . Insulin Glargine-Lixisenatide (SOLIQUA) 100-33 UNT-MCG/ML SOPN Inject 60 Units into the skin at bedtime.    03/17/2018  . insulin lispro (HUMALOG) 100 UNIT/ML injection Inject 10-22 Units into the skin 4 (four) times daily. Sliding scale 4 times a day   03/17/2018  . ipratropium (ATROVENT) 0.02 % nebulizer solution Take 0.5 mg by nebulization every 4 (four) hours as needed for wheezing or shortness of breath.   03/17/2018  . KOMBIGLYZE XR 2.05-998 MG TB24 Take 1 tablet by mouth daily.  11 03/17/2018  . levofloxacin (LEVAQUIN) 750 MG tablet Take 750 mg by mouth daily.    03/17/2018  . pantoprazole (PROTONIX) 40 MG tablet Take 1 tablet (40 mg total) by mouth daily. 30 tablet 2 03/17/2018   Scheduled: . allopurinol  300 mg Oral QPM  . arformoterol  15 mcg Nebulization BID  . budesonide (PULMICORT) nebulizer solution  0.5 mg Nebulization BID  . calcium carbonate  1 tablet Oral BID WC   And  . cholecalciferol  1,000 Units Oral BID WC  . chlorhexidine  15 mL Mouth Rinse BID  . Chlorhexidine Gluconate Cloth  6 each Topical Daily  . enoxaparin (LOVENOX) injection  70 mg Subcutaneous  Q24H  . fluticasone  2 spray Each Nare QPM  . guaiFENesin  1,200 mg Oral BID  . insulin aspart  0-20 Units Subcutaneous TID WC  . insulin aspart  0-5 Units Subcutaneous QHS  . insulin aspart  15 Units Subcutaneous TID WC  . insulin glargine  70 Units Subcutaneous QHS  . ipratropium  0.5 mg Nebulization Q6H  . levalbuterol  0.63 mg Nebulization Q6H  . loratadine  10 mg Oral Daily  . mouth rinse  15 mL Mouth Rinse q12n4p  . pantoprazole  40 mg Oral Daily  . predniSONE  60 mg Oral Q breakfast   Continuous: . sodium chloride 10 mL/hr at 03/24/18 0618  . albuterol 15 mg/hr (03/18/18 1151)  . ceFEPime (MAXIPIME) IV 2 g (03/24/18 0618)   XJO:ITGPQD chloride, acetaminophen **OR** acetaminophen, benzonatate, ibuprofen, levalbuterol, ondansetron **OR** ondansetron (ZOFRAN) IV  Assesment:she was admitted with bronchospasm and acute hypoxic respiratory failure. She required bipap. She was on HFNC but she has improved and oxygen requirements are much decreased. CT shows pneumonia. She has been on antibiotics, steroids, inhaled bronchodilators. Much better now.   She has morbid obesity with obesity hypoventilation and sleep apnea now back on home CPAP.  I think ok to discharge from a pulmonary point of view on likely short term  Home oxygen.   Repeat cxr or CT in 1 month  Active Problems:   Morbid obesity ([place])   Tobacco abuse   Bronchospasm, acute   Acute respiratory failure with hypoxia ([place])   Uncontrolled type 2 diabetes mellitus with hyperglycemia ([place])    Plan:as above . I will plan to sign off. Thanks for allowing me to see her with you    LOS: 6 days   Alonza Bogus 03/24/2018, 7:47 AM

## 2018-03-24 NOTE — Progress Notes (Signed)
SATURATION QUALIFICATIONS: (This note is used to comply with regulatory documentation for home oxygen)  Patient Saturations on Room Air at Rest = 94%  Patient Saturations on Room Air while Ambulating = 86%  Patient Saturations on 4 Liters of oxygen while Ambulating = 98%  Please briefly explain why patient needs home oxygen: Patient desats when walking. Has to bump herself up to 6L with exertion but with little exertion she is okay on 4L.

## 2018-03-24 NOTE — TOC Transition Note (Signed)
Transition of Care [facility]) - CM/SW Discharge Note   Patient Details  Name: Cheryl Black MRN: [id number] Date of Birth: 14-Aug-1985  [facility]) CM/SW Contact:  Elliot Gault, LCSW Phone Number: 03/24/2018, 11:28 AM   Clinical Narrative:   Pt will dc home with Home O2 setup. Pt requests referral to [facility] for the O2 as she already receives her CPAP from them. Referral given to [facility] at [facility]. There are no other TOC needs for dc.    Final next level of care: Home/Self Care Barriers to Discharge: No Barriers Identified   Patient Goals and CMS Choice Patient states their goals for this hospitalization and ongoing recovery are:: return home with return to prior level of function CMS Medicare.gov Compare Post Acute Care list provided to:: Patient Choice offered to / list presented to : Patient                                                 Readmission Risk Interventions No flowsheet data found.

## 2018-03-25 ENCOUNTER — Telehealth: Payer: Self-pay | Admitting: Gastroenterology

## 2018-03-25 LAB — HEPATITIS C ANTIBODY: HCV Ab: <0.1 s/co ratio (ref 0.0–0.9)

## 2018-03-25 LAB — HEPATITIS B SURFACE ANTIGEN: Hepatitis B Surface Ag: NEGATIVE

## 2018-03-25 NOTE — Telephone Encounter (Signed)
Please reschedule OV with RMR only in 2 months for abnormal LFTs and RUQ pain.  She had to miss ov this week while inpatient.

## 2018-03-26 ENCOUNTER — Encounter: Payer: Self-pay | Admitting: Internal Medicine

## 2018-03-26 ENCOUNTER — Other Ambulatory Visit: Payer: Self-pay | Admitting: *Deleted

## 2018-03-26 DIAGNOSIS — J9601 Acute respiratory failure with hypoxia: Secondary | ICD-10-CM | POA: Diagnosis not present

## 2018-03-26 MED FILL — SYMBICORT 160-4.5 MCG INH: 160-4.5 | 30 days supply | Qty: 10 | Fill #0

## 2018-03-26 NOTE — Patient Outreach (Signed)
[facility]) Care Management  03/26/2018  Cheryl Black 08-02-1985 [id number]    Transition of care call   Referral received:  03/25/2018 Initial outreach:  03/26/2018 Insurance: [facility] Health Choice Plan   Subjective: Initial successful telephone call to patient's preferred number in order to complete transition of care assessment; 2 HIPAA identifiers verified. Explained purpose of call and completed transition of care assessment.  States she is doing well, denies post op problems, states she  well managed with prescribed medications, tolerating  diet , denies bowel or bladder problems.  Spouse/children are assisting  with his/her recovery.      Objective:  Mr/Mrs was hospitalized at [facility] from 03/18/2018- 03/24/2018 for Comorbidities include: Acute respiratory failure, Type 2 Diabetes, Bronchospasm She was discharged to home on 03/25/2018 with the need for home oxygen which was delivered on Wednesday 03/24/2018 DME.   Assessment:  Patient voices good understanding of all discharge instructions.  See transition of care flowsheet for assessment details.   Plan:  Reviewed [place] [employer] 2020 Wellness Requirements of: Completing the computerized Health Assessment and the Health Action Step with Active Health Management [facility]) by September 07 2018 AND have an annual physical between January 06, 2017 and July 07, 2018.  No ongoing care management needs identified so will close case to [facility] Care Management care management services and route successful outreach letter with [facility] Care Management pamphlet and 24 Hour Nurse Line Magnet to [employer] Care Management clinical pool to be mailed to patient's home address.   Patient was recently discharged from hospital and all medications have been reviewed.  Elliot Cousin, RN Care Management Coordinator [facility] Main Office  [phone]

## 2018-03-26 NOTE — Telephone Encounter (Signed)
SCHEDULED AND LETTER SENT  °

## 2018-03-29 MED FILL — UNIFINE PENTIPS 31GX3/16": 31G X 5 MM | 20 days supply | Qty: 100 | Fill #0

## 2018-03-29 MED FILL — HYDROCHLOROTHIAZIDE 12.5 MG: 12.5 | 30 days supply | Qty: 30 | Fill #0

## 2018-03-29 MED FILL — HUMALOG 100 UNITS/ML KWIKPE: 100 | 10 days supply | Qty: 6 | Fill #0

## 2018-03-29 MED FILL — ALLOPURINOL 300 MG TABS: 300 | 30 days supply | Qty: 30 | Fill #0

## 2018-03-29 MED FILL — UNIFINE PENTIPS 31GX3/16: 31G X 5 MM | 20 days supply | Qty: 100 | Fill #0

## 2018-03-29 MED FILL — FREESTYLE LITE TEST STRIP: 25 days supply | Qty: 100 | Fill #0

## 2018-03-29 MED FILL — BROVANA 15 MCG/2 ML SOLN: 15 | 90 days supply | Qty: 120 | Fill #0

## 2018-03-30 DIAGNOSIS — G4733 Obstructive sleep apnea (adult) (pediatric): Secondary | ICD-10-CM | POA: Diagnosis not present

## 2018-03-30 MED FILL — SOLIQUA 100 UNIT-33 MCG/ML: 100-33 | 25 days supply | Qty: 15 | Fill #0

## 2018-04-02 MED FILL — BUTALB-ACETAMIN-CAFF 50-325: 50-325-40 | 30 days supply | Qty: 30 | Fill #0

## 2018-04-02 MED FILL — FREESTYLE LANCETS: 25 days supply | Qty: 100 | Fill #0

## 2018-04-05 DIAGNOSIS — R51 Headache: Secondary | ICD-10-CM | POA: Diagnosis not present

## 2018-04-05 DIAGNOSIS — R509 Fever, unspecified: Secondary | ICD-10-CM | POA: Diagnosis not present

## 2018-04-05 DIAGNOSIS — M542 Cervicalgia: Secondary | ICD-10-CM | POA: Diagnosis not present

## 2018-04-06 ENCOUNTER — Telehealth: Payer: Self-pay | Admitting: Obstetrics and Gynecology

## 2018-04-06 NOTE — Telephone Encounter (Signed)
Patient calling to reschedule her sonahystogram that was scheduled for 03/18/2018. Please advise.

## 2018-04-07 ENCOUNTER — Other Ambulatory Visit: Payer: Self-pay

## 2018-04-07 ENCOUNTER — Encounter: Payer: Self-pay | Admitting: Internal Medicine

## 2018-04-07 ENCOUNTER — Ambulatory Visit (INDEPENDENT_AMBULATORY_CARE_PROVIDER_SITE_OTHER): Payer: 59 | Admitting: Internal Medicine

## 2018-04-07 DIAGNOSIS — R0600 Dyspnea, unspecified: Secondary | ICD-10-CM

## 2018-04-07 DIAGNOSIS — R0609 Other forms of dyspnea: Secondary | ICD-10-CM

## 2018-04-07 DIAGNOSIS — J9601 Acute respiratory failure with hypoxia: Secondary | ICD-10-CM

## 2018-04-07 NOTE — Telephone Encounter (Signed)
Ideally SHG's are done day 5-9 after onset of a menses.

## 2018-04-07 NOTE — Assessment & Plan Note (Addendum)
See admit  03/18/2018 with ? Bronchopna> off all 02 at f/u televisit  07/11/4358    Certainly c/w viral vs bact bronchopna that has now resolved so ok to leave off 02 and f/u here prn  >>>> If recurrent HA or CAP become issues with this pt would strongly rec CT sinus looking for potential  source of both - otherwise pulmonary f/u is prn    Each maintenance medication was reviewed in detail including most importantly the difference between maintenance and as needed and under what circumstances the prns are to be used.  Please see AVS for specific  Instructions which are unique to this visit and I personally typed out  which were reviewed in detail in writing with the patient and a copy provided.

## 2018-04-07 NOTE — Assessment & Plan Note (Signed)
Onset p pna March 2018  Allergy profile 09/16/2017 >  Eos 0.2 /  IgE 53  RAST pos Dog > Cat and timothy grass - PFT's  09/23/2017  FEV1 3.78 (107 % ) ratio 85  p 6 % improvement from saba p nothing prior to study with DLCO  98 % corrects to 99 % for alv volume  And ERV 21  - Sinus CT 10/01/17 rec but not done   She is much better p admit and rx with symbicort then brovana but was not provided with budesonide and has no evidence of copd but likely rather hand AB acute flare  s evidence of pe or true CAP on CTa.  Therefore rec go back to prev recs now that doe back to baseline:  1) stop brovana  2) Only use your albuterol as a rescue medication to be used if you can't catch your breath by resting or doing a relaxed purse lip breathing pattern.  - The less you use it, the better it will work when you need it. - Ok to use up to 2 puffs  every 4 hours if you must but call for immediate appointment if use goes up over your usual need - Don't leave home without it !!  (think of it like the spare tire for your car)   Discussed rule of 2s If your breathing worsens or you need to use your rescue inhaler more than twice weekly or wake up more than twice a month with any respiratory symptoms or require more than two rescue inhalers per year, need to start back on symbicort 160 right away because saba /laba do  not control inflammation and overuse can lead to unnecessary and costly consequences.  They can make you feel better temporarily but eventually they will quit working effectively much as sleep aids lead to more insomnia if used regularly.

## 2018-04-07 NOTE — Telephone Encounter (Signed)
If Cheryl Black will notify us when she has her April Menses, we could do it after that.

## 2018-04-07 NOTE — Assessment & Plan Note (Signed)
ERV 21% by pfts 09/23/17   This is the most likely cause for chronic doe  - defer f/u to Dr Juel Burrow capable hand and pulmonary f/u can be prn

## 2018-04-07 NOTE — Progress Notes (Signed)
Cheryl Black, female    DOB: [birth year], 33 y.o.   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 seasonal sinus issues spring /fall since childhood takes allegra/ flonase never bad enough to see specialist but March 2018 flu/sinusitis / pna never admitted slow recovery back to baseline s meds  Then cp / wheeze fall 2018 > 100% no need for anything until August 2019 lots of nasal symptoms s much cough  then sob    Admit date: 08/25/2017 Discharge date: 08/31/2017      Discharge Diagnoses:  Principal Problem:   Bronchospasm, acute Active Problems:   Diabetes mellitus type 2 in obese ([place])   Morbid obesity ([place])   Polycystic ovary disease   Tobacco abuse   SOB (shortness of breath)   Acute respiratory failure ([place])   Discharge Condition: Stable and improved       Filed Weights   08/25/17 2303 08/26/17 1434 08/27/17 0406  Weight: (!) 137.8 kg 132.4 kg (!) 138.9 kg    History of present illness:  As per Dr. Shanon Black on 8/20[name] a 33 y.o.femalewith medical history significant ofdiabetes, PCOS, obesity, pack a day smoker with no prior history of asthma or COPD comes in with several days of shortness of breath and wheezing at home. Patient was seen here in the ED on August 18 and given oral steroids and albuterol to take at home. She did this around the clock was not getting any better she saw her primary care physician yesterday who added on azithromycin orally and Spiriva inhaler. Patient reports she continued to still not get better. She denies any fevers but says that she is having a spasmy cough. She denies any lower extremity swelling or edema. She has no history of VT and has not really had any recent traveling. She is not on any estrogen medications. She does not have chest pain but reports some right sided posterior pain when taking a deep breath. Patient is being referred for admission  for bronchospasm. She is been on BiPAP for the last couple of hours and she feels like this is helping her. Per respiratory therapy she is wheezing less. Patient be referred for admission for acute respiratory failure requiring BiPAP support.  Hospital Course:   Acute hypoxemic respiratory failure -She has not required BIPAP past72 hours. -CT shows no evidence of PE, however she does have significant bilateral lower lobe atelectasis and right middle lobe atelectasis, radiologist is also concerned about a component of superimposed pneumonia. There is no evidence of pulmonary edema. -No significant wheezing on exam. -Transition abx over to augmentin for 3 more days on DC. -Continue incentive spirometer and flutter valve. -DC on a prednisone taper. Albuterol and symbicort. -Given long-standing tobacco abuse, would benefit from PFTs post discharge. -Would also benefit from sleep study as an outpatient given her morbid obesity and probability of sleep apnea/OHS. -Getting alpha 1 antitrypsin level per Dr. Luan Black recommendations. Pending on DC. -Currently without oxygen requirements.  Tobacco abuse -Tobacco cessation was discussed with the patient on 8/21. -She would like to be discharged with nicotine patches.  Morbid obesity -Noted, counseled on lifestyle modifications.  Type 2 diabetes -CBGs have been elevated, likely due to steroid use, but improving with steroid titration. -A1c is elevated at 7.1. -Close OP follow up.    Discharge Instructions      Discharge Instructions    Diet -  low sodium heart healthy   Complete by:  As directed    Increase activity slowly   Complete by:  As directed           Allergies as of 08/31/2017      Reactions   Ace Inhibitors Anaphylaxis   Beta Adrenergic Blockers Other (See Comments)   Angioedema   Augmentin [amoxicillin-pot Clavulanate] Other (See Comments)   Has had C.diff in the past   Benicar [olmesartan] Other  (See Comments)   Facial edema   Doxycycline Other (See Comments)   Makes feet and hands red   Influenza Vaccine Live Swelling   Arm    Invokana [canagliflozin] Other (See Comments)   Dehydration         Medication List    STOP taking these medications   azithromycin 250 MG tablet Commonly known as:  ZITHROMAX   ipratropium 0.02 % nebulizer solution Commonly known as:  ATROVENT     TAKE these medications   albuterol (5 MG/ML) 0.5% nebulizer solution Commonly known as:  PROVENTIL Take 0.5 mLs (2.5 mg total) by nebulization every 6 (six) hours as needed for wheezing or shortness of breath. What changed:  Another medication with the same name was added. Make sure you understand how and when to take each.   albuterol 108 (90 Base) MCG/ACT inhaler Commonly known as:  PROVENTIL HFA;VENTOLIN HFA Inhale 2 puffs into the lungs every 6 (six) hours as needed for wheezing or shortness of breath. What changed:  You were already taking a medication with the same name, and this prescription was added. Make sure you understand how and when to take each.   allopurinol 300 MG tablet Commonly known as:  ZYLOPRIM Take 300 mg by mouth every evening.   amoxicillin-clavulanate 875-125 MG tablet Commonly known as:  AUGMENTIN Take 1 tablet by mouth 2 (two) times daily for 3 days.   budesonide-formoterol 160-4.5 MCG/ACT inhaler Commonly known as:  SYMBICORT Inhale 1 puff into the lungs 2 (two) times daily. What changed:  when to take this   Calcium Carb-Cholecalciferol [id number] MG-UNIT Caps Take 2 capsules by mouth every evening.   fexofenadine 180 MG tablet Commonly known as:  ALLEGRA Take 180 mg by mouth every evening.   fluticasone 50 MCG/ACT nasal spray Commonly known as:  FLONASE Place 2 sprays into both nostrils every evening.   ibuprofen 200 MG tablet Commonly known as:  ADVIL,MOTRIN Take 800 mg by mouth every 6 (six) hours as needed for mild pain or  moderate pain.   ipratropium-albuterol 0.5-2.5 (3) MG/3ML Soln Commonly known as:  DUONEB Take 3 mLs by nebulization every 6 (six) hours.   KOMBIGLYZE XR 2.05-998 MG Tb24 Generic drug:  Saxagliptin-Metformin Take 1 tablet by mouth every evening.   nicotine 14 mg/24hr patch Commonly known as:  NICODERM CQ - dosed in mg/24 hours Place 1 patch (14 mg total) onto the skin daily. Start taking on:  09/01/2017   pantoprazole 40 MG tablet Commonly known as:  PROTONIX Take 1 tablet (40 mg total) by mouth daily. Start taking on:  09/01/2017 What changed:    how much to take  when to take this   predniSONE 10 MG tablet Commonly known as:  DELTASONE Take 1 tablet (10 mg total) by mouth daily with breakfast. Take 6 tablets today and then decrease by 1 tablet daily until none are left. What changed:    medication strength  how much to take  when to take this  additional instructions  rizatriptan 5 MG tablet Commonly known as:  MAXALT Take 5 mg by mouth as needed for migraine. May repeat in 2 hours if needed         09/16/2017  Pulmonary / first Ok [place] /[place] [facility]  Chief Complaint  Patient presents with   Pulmonary Consult    Referred by Dr. Delphina Cahill. Pt states she has had recurrent PNA since March 2018.  She c/o DOE with minimal exertion such as taking a shower. She has occ chest tightness. She uses her albuterol inhaler 3-4 x per wk on average, but has not needed neb recently.   50% improved but variable sob showering /  Dyspnea:   MMRC1 = can walk nl pace, flat grade, can't hurry or go uphills or steps s sob   Cough: none Sleep: on side / big pillow SABA use: as above, much less need now  rec Increase symbicort 160  Take 2 puffs first thing in am and then another 2 puffs about 12 hours later.  Work on inhaler technique:  relax and gently blow all the way out then take a nice smooth deep breath back in, triggering the inhaler at same time you start breathing in.    Pantoprazole (protonix) 40 mg   Take  30-60 min before first meal of the day and Pepcid (famotidine)  20 mg one @  bedtime until return to office - this is the best way to tell whether stomach acid is contributing to your problem.   GERD diet  Please remember to go to the lab department downstairs in the basement  for your tests - we will call you with the results when they are available.    09/24/2017  f/u ov/Cheryl Black re: AB  Chief Complaint  Patient presents with   Follow-up    Breathing has improved some but not back to her normal baseline. She c/o nausea and vomitting- relates to symbicort so she d/c'ed.   Dyspnea:  MMRC1 = can walk nl pace, flat grade, can't hurry or go uphills or steps s sob  / showering is better Cough: none Sleeping: falt one pillow SABA use: none this week stopped symbicort 09/20/17  02: no  Diarrhea since 09/17/17 but none noct on ppi bid  rec No evidence of asthma so ok to leave off symbicort permanently Reduce protonix back to Take 30-60 min before first meal of the day until you See Dr Nevada Crane or your GI doctor and take pepcd 20 mg after supper  Only use your albuterol as a rescue medication Pulmonary follow up is as needed     Admit date: 03/18/2018 Discharge date: 03/24/2018  Admitted From:Home Disposition:  Home   Equipment/Devices: 4 L [place]    Brief/Interim Summary: 33 y.o.femalewith medical history ofdiabetes mellitus type 2, PCOS, hyperlipidemia, obesity, tobacco abuse presenting with cough, congestion, and generalized weakness that began on 03/14/2018. The patient had been running fevers up to 101.0 F at home. She went to see her primary care provider on 03/15/2018 and was started on Augmentin and doxycycline. The patient had taken 2 doses of Augmentin. However, when she took the doxycycline, she developed a rash on her hands. She stated that her breathing and coughing and chest congestion did not improve. She went back to see her primary care  provider on 03/18/2018. The patient was given a prescription for levofloxacin, but her breathing was worsening. As result, the patient was sent to emergency department for further evaluation. The patient denies any recent travels outside  of [place]. However, she works as an Therapist, sports in the emergency department. Her last shift at work was on 03/10/2018. The patient lives at home with her spouse who was recently discharged from the hospital on 03/12/2018 after a stay for treatment for COPD exacerbation. He also smokes, but he has not traveled outside [place] in the past month. The patient has not had any sick close contacts. Patient has had some chest tightness with her coughing but she denies any nausea, vomiting, diarrhea, abdominal pain, headache, sore throat. There is no abdominal pain, dysuria, hematuria. She has been using her nebulizer machine at home without much improvement. In the emergency department, the patient developed some respiratory distress. She was placed on BiPAP. She was given bronchodilators and the Brethine. She was started on Solu-Medrol and levofloxacin. The patient was admitted for further evaluationand treatment.  Her antibiotics were discontinued as the clinical picture was not consistent bacterial infection.  She gradually improved albeit slow.  Pulmonary medicine, Dr. Luan Black was consulted.  He started pt back on cefepime of which she had 3 days.  Ultimately, her oxygen was weaned down to 4L with which she will go home.     Discharge Diagnoses:  Acute Respiratory Failure with Hypoxia -due to bronchospasm, likely due to underlying viral infection -pt does not have diagnosis of asthma or COPD -Patient had seen pulmonology, Dr. Melvyn Novas on 09/24/2016--PFTs did not suggest COPD or asthma -?reactive airway disease -stable on BiPAP>>>weaned to10L HFNC>>>5L -PCT <0.10 -3/15 CTA chest--no PE, no GOO, bibasilar atelectatic PNA -cefepime per pulmonary--pt had 3  days -d/c vancomycin--MRSA screening neg -ambulatory pulse ox on RA showed desaturation <88%-->d/c home with 4 L   Acute Bronchospasm/reactive airway disease -personally reviewed CXR--interstitial coarsening without consolidation -Viral respiratory panel-->+rhinovirus/enterovirus -Influenza rapid antigen test was negative in the office -ContinuePulmicort -ContinueXopenex -ContinueSolu-Medrol>>>wean to prednisone 3/18 -Continue loratadine -addedBrovana -d/c home with prednisone taper over 6 days -follow up Dr. Melvyn Novas after d/c  Diabetes mellitus type 2, uncontrolled with hyperglycemia -Hemoglobin A1c--8.9 -IncreaseLantus70 units daily -Resistant NovoLog sliding scale -Holding Kombiglyze--restart after d/c -increasenovolog 15units with meals  Nocturnal Bradycardia -HR into 40s -no AV block or pauses -likely due to OHS/OSA -cardiology consult appreciated-->no further intervention -continue CPAP  Morbid obesity -BMI 45.61 -Lifestyle modification  PCOS -Patient follows with Dr. [name] -No longer onProvera  Transaminasemia -due to hepatic steatosis(noted of prior CTs) -no abd pain; tolerating diet -hep B surface antigen--pending -hep C antibody--pending -overall stable -outpt follow up  Tobacco Abuse -I have discussed tobacco cessation with the patient. I have counseled the patient regarding the negative impacts of continued tobacco use including but not limited to lung cancer, COPD, and cardiovascular disease. I have discussed alternatives to tobacco and modalities that may help facilitate tobacco cessation including but not limited to biofeedback, hypnosis, and medications. Total time spent with tobacco counseling was 4 minutes.     Virtual Visit via Telephone Note  I connected with Cheryl Black on 04/07/18 at  9:15 AM EDT by telephone and verified that I am speaking with the correct person using two identifiers.   I discussed the  limitations, risks, security and privacy concerns of performing an evaluation and management service by telephone and the availability of in person appointments. I also discussed with the patient that there may be a patient responsible charge related to this service. The patient expressed understanding and agreed to proceed.   History of Present Illness: resp wise better p d/c above but then  HA /  fever since 04/03/18 started back on augmentin and better - never had ct sinus rec at last ov No wearing 02 at all x one week, sats are fine when she checks them  Breathing ok with  Walking ex some doe x steps/ housework Prior to admit had not needed any inhalers but restarted symbicort until got brovana post d/c in mail 04/02/2018 which she takes bid and stopped symbicort/ has not needed any saba Still not smoking  Some dry cough p supper, not keeping her up at night sleeping flat x 1 pillow on autocpap  No obvious day to day or daytime variability or assoc excess/ purulent sputum or mucus plugs or hemoptysis or cp or chest tightness, subjective wheeze or overt sinus or hb symptoms.        Observations/Objective: Sounds great on phone, no hoarseness or cough and speaking in full sentences    Assessment and Plan:  See today's problem list for a/p's     Follow Up Instructions:  See avs for instructions unique to this ov which includes revised/ updated med list     I discussed the assessment and treatment plan with the patient. The patient was provided an opportunity to ask questions and all were answered. The patient agreed with the plan and demonstrated an understanding of the instructions.   The patient was advised to call back or seek an in-person evaluation if the symptoms worsen or if the condition fails to improve as anticipated.  I provided 25  minutes of non-face-to-face time during this encounter.   Christinia Gully, MD

## 2018-04-07 NOTE — Patient Instructions (Signed)
Only use your albuterol as a rescue medication to be used if you can't catch your breath by resting or doing a relaxed purse lip breathing pattern.  - The less you use it, the better it will work when you need it. - Ok to use up to 2 puffs  every 4 hours if you must but call for immediate appointment if use goes up over your usual need - Don't leave home without it !!  (think of it like the spare tire for your car)   If need albuterol more than twice daily start back on symbicort Take 2 puffs first thing in am and then another 2 puffs about 12 hours later.   Only use your nebulized albuterol if you try the albuterol inhaler first and it doesn't work - ok to repeat every 4 hours as needed but call me asap for follow up if this occurs  Stop brovana  Good luck on maintaining off cigarettes  Pulmoanry follow up is as needed

## 2018-04-09 ENCOUNTER — Inpatient Hospital Stay: Payer: 59 | Admitting: Internal Medicine

## 2018-04-13 ENCOUNTER — Encounter: Payer: Self-pay | Admitting: *Deleted

## 2018-04-13 NOTE — Progress Notes (Unsigned)
Pt calling and states her last period began on 04/05/2018 and ended 2 days ago.

## 2018-04-20 DIAGNOSIS — E782 Mixed hyperlipidemia: Secondary | ICD-10-CM | POA: Diagnosis not present

## 2018-04-20 DIAGNOSIS — E119 Type 2 diabetes mellitus without complications: Secondary | ICD-10-CM | POA: Diagnosis not present

## 2018-04-20 DIAGNOSIS — E1165 Type 2 diabetes mellitus with hyperglycemia: Secondary | ICD-10-CM | POA: Diagnosis not present

## 2018-04-20 DIAGNOSIS — R0602 Shortness of breath: Secondary | ICD-10-CM | POA: Diagnosis not present

## 2018-04-20 DIAGNOSIS — I10 Essential (primary) hypertension: Secondary | ICD-10-CM | POA: Diagnosis not present

## 2018-04-23 DIAGNOSIS — R944 Abnormal results of kidney function studies: Secondary | ICD-10-CM | POA: Diagnosis not present

## 2018-04-23 DIAGNOSIS — J4540 Moderate persistent asthma, uncomplicated: Secondary | ICD-10-CM | POA: Diagnosis not present

## 2018-04-23 DIAGNOSIS — K219 Gastro-esophageal reflux disease without esophagitis: Secondary | ICD-10-CM | POA: Diagnosis not present

## 2018-04-23 DIAGNOSIS — E782 Mixed hyperlipidemia: Secondary | ICD-10-CM | POA: Diagnosis not present

## 2018-04-23 DIAGNOSIS — F17200 Nicotine dependence, unspecified, uncomplicated: Secondary | ICD-10-CM | POA: Diagnosis not present

## 2018-04-23 DIAGNOSIS — M1000 Idiopathic gout, unspecified site: Secondary | ICD-10-CM | POA: Diagnosis not present

## 2018-04-23 DIAGNOSIS — E1165 Type 2 diabetes mellitus with hyperglycemia: Secondary | ICD-10-CM | POA: Diagnosis not present

## 2018-04-23 MED FILL — HUMALOG 100 UNITS/ML KWIKPE: 100 | 30 days supply | Qty: 18 | Fill #0

## 2018-04-23 MED FILL — SOLIQUA 100 UNIT-33 MCG/ML: 100-33 | 25 days supply | Qty: 15 | Fill #0

## 2018-04-23 MED FILL — PANTOPRAZOLE SOD DR 40 MG T: 40 | 90 days supply | Qty: 180 | Fill #0

## 2018-04-23 MED FILL — ALLOPURINOL 300 MG TABS: 300 | 90 days supply | Qty: 90 | Fill #0

## 2018-04-23 MED FILL — KOMBIGLYZE XR 2.5-1,000 MG: 2.5-1000 | 30 days supply | Qty: 60 | Fill #0

## 2018-04-23 MED FILL — HYDROCHLOROTHIAZIDE 12.5 MG: 12.5 | 90 days supply | Qty: 90 | Fill #0

## 2018-04-24 DIAGNOSIS — J9601 Acute respiratory failure with hypoxia: Secondary | ICD-10-CM | POA: Diagnosis not present

## 2018-05-24 DIAGNOSIS — J9601 Acute respiratory failure with hypoxia: Secondary | ICD-10-CM | POA: Diagnosis not present

## 2018-06-08 MED FILL — HUMALOG 100 UNITS/ML KWIKPE: 100 | 30 days supply | Qty: 18 | Fill #1

## 2018-06-08 MED FILL — UNIFINE PENTIPS 31GX3/16": 31G X 5 MM | 20 days supply | Qty: 100 | Fill #1

## 2018-06-08 MED FILL — SOLIQUA 100 UNIT-33 MCG/ML: 100-33 | 25 days supply | Qty: 15 | Fill #1

## 2018-06-08 MED FILL — FREESTYLE LITE TEST STRIP: 25 days supply | Qty: 100 | Fill #1

## 2018-06-08 MED FILL — KOMBIGLYZE XR 2.5-1,000 MG: 2.5-1000 | 30 days supply | Qty: 60 | Fill #1

## 2018-06-08 MED FILL — UNIFINE PENTIPS 31GX3/16: 31G X 5 MM | 20 days supply | Qty: 100 | Fill #1

## 2018-06-08 MED FILL — FREESTYLE LANCETS: 25 days supply | Qty: 100 | Fill #1

## 2018-06-24 DIAGNOSIS — J9601 Acute respiratory failure with hypoxia: Secondary | ICD-10-CM | POA: Diagnosis not present

## 2018-07-02 ENCOUNTER — Ambulatory Visit: Payer: 59 | Admitting: Internal Medicine

## 2018-07-02 ENCOUNTER — Other Ambulatory Visit: Payer: Self-pay

## 2018-07-02 ENCOUNTER — Encounter: Payer: Self-pay | Admitting: Internal Medicine

## 2018-07-02 VITALS — BP 117/79 | HR 69 | Temp 98.6°F | Ht 69.0 in | Wt 294.0 lb

## 2018-07-02 DIAGNOSIS — R1310 Dysphagia, unspecified: Secondary | ICD-10-CM | POA: Diagnosis not present

## 2018-07-02 DIAGNOSIS — R1011 Right upper quadrant pain: Secondary | ICD-10-CM

## 2018-07-02 DIAGNOSIS — G8929 Other chronic pain: Secondary | ICD-10-CM

## 2018-07-02 DIAGNOSIS — R1319 Other dysphagia: Secondary | ICD-10-CM

## 2018-07-02 NOTE — Progress Notes (Signed)
Primary Care Physician:  [name], MD Primary Gastroenterologist:  Dr. Gala Romney  Pre-Procedure History & Physical: HPI:  Cheryl Black is a 33 y.o. female here for follow-up of elevated liver function studies.  Right upper quadrant abdominal pain and a GERD.  Patient states right upper quadrant abdominal pain been present for years since her cholecystectomy.  Has only fleeting discomfort and has somewhat of a positional component;  does not last any more than 5 minutes (longest episode).  Not associated with eating or having a bowel movement.  History of poorly controlled diabetes with hemoglobin A1c  near 10 range.  Nonspecific transaminitis.  Viral markers negative;  Ceruloplasmin ok. Mild steatohepatitis on liver biopsy at time of cholecystectomy previously.  MRCP negative.  Major issues clinically these days are some insidiously developing esophageal dysphagia to solids and liquids.  Reflux symptoms 3-4 times weekly on Protonix 40 mg daily.  No prior EGD.    Past Medical History:  Diagnosis Date  . Acid reflux   . Asthma   . Clostridium difficile infection    in the past  . Diabetes mellitus   . Fatty liver   . Fibrocystic breast disease   . Gout   . Hidradenitis   . History of kidney stones   . Hyperlipemia   . Obesity   . Polycystic ovary syndrome   . PONV (postoperative nausea and vomiting)   . Smoker     Past Surgical History:  Procedure Laterality Date  . CHOLECYSTECTOMY  01/10/2011   Procedure: LAPAROSCOPIC CHOLECYSTECTOMY;  Surgeon: Jamesetta So;  Location: [facility];  Service: General;  Laterality: N/A;  . HYDRADENITIS EXCISION Right 06/18/2012   Procedure: EXCISION HYDRIADENITIS SUPRATIVA  RIGHT AXILLA;  Surgeon: Scherry Ran, MD;  Location: [facility];  Service: General;  Laterality: Right;  . LIVER BIOPSY  01/10/2011   Procedure: LIVER BIOPSY;  Surgeon: Jamesetta So;  Location: [facility];  Service: General;;  . SKIN SPLIT GRAFT Right 06/18/2012   Procedure:  SKIN GRAFT SPLIT THICKNESS RIGHT AXILLA(WITH TWO STANDARD SKIN BOARDS 3"x 8" )  DONOR SITE RIGHT AND LEFT THIGHS;  Surgeon: Scherry Ran, MD;  Location: [facility];  Service: General;  Laterality: Right;    Prior to Admission medications   Medication Sig Start Date End Date Taking? Authorizing Provider  acetaminophen (TYLENOL) 500 MG tablet Take 1,000 mg by mouth every 6 (six) hours as needed.   Yes [provider]  albuterol (PROVENTIL HFA;VENTOLIN HFA) 108 (90 Base) MCG/ACT inhaler Inhale 2 puffs into the lungs every 6 (six) hours as needed for wheezing or shortness of breath. 08/31/17  Yes Isaac Bliss, Rayford Halsted, MD  albuterol (PROVENTIL) (5 MG/ML) 0.5% nebulizer solution Take 0.5 mLs (2.5 mg total) by nebulization every 6 (six) hours as needed for wheezing or shortness of breath. 08/23/17  Yes Jacqlyn Larsen, PA-C  allopurinol (ZYLOPRIM) 300 MG tablet Take 300 mg by mouth every evening.    Yes [provider]  butalbital-acetaminophen-caffeine (FIORICET, ESGIC) 50-325-40 MG tablet Take 1 tablet by mouth once a week.  01/21/18  Yes [provider]  Calcium Carb-Cholecalciferol [phone] MG-UNIT CAPS Take 2 capsules by mouth every evening.   Yes [provider]  calcium carbonate (TUMS CHEWY BITES) 750 MG chewable tablet Chew 1 tablet by mouth as needed for heartburn.   Yes [provider]  fexofenadine (ALLEGRA) 180 MG tablet Take 180 mg by mouth every evening.   Yes [provider]  fluticasone (FLONASE) 50 MCG/ACT nasal spray Place 2 sprays into both nostrils daily.    Yes [provider]  hydrochlorothiazide (MICROZIDE) 12.5 MG capsule Take 1 capsule (12.5 mg total) by mouth daily. 02/17/18  Yes Jonnie Kind, MD  Insulin Glargine-Lixisenatide (SOLIQUA) 100-33 UNT-MCG/ML SOPN Inject 60 Units into the skin at bedtime.    Yes [provider]  insulin lispro (HUMALOG) 100 UNIT/ML injection Inject 10-22 Units into the skin  4 (four) times daily. Sliding scale 4 times a day   Yes [provider]  KOMBIGLYZE XR 2.05-998 MG TB24 Take 1 tablet by mouth daily.  02/13/16  Yes [provider]  pantoprazole (PROTONIX) 40 MG tablet Take 1 tablet (40 mg total) by mouth daily. 09/01/17  Yes Isaac Bliss, Rayford Halsted, MD  ibuprofen (ADVIL,MOTRIN) 800 MG tablet Take 800 mg by mouth every 8 (eight) hours as needed.    [provider]  ipratropium (ATROVENT) 0.02 % nebulizer solution Take 0.5 mg by nebulization every 4 (four) hours as needed for wheezing or shortness of breath.    [provider]  predniSONE (DELTASONE) 10 MG tablet Take 6 tablets (60 mg total) by mouth daily with breakfast. And decrease by one tablet daily. Patient not taking: Reported on 07/02/2018 03/25/18   Orson Eva, MD    Allergies as of 07/02/2018 - Review Complete 07/02/2018  Allergen Reaction Noted  . Ace inhibitors Anaphylaxis 03/09/2016  . Beta adrenergic blockers Other (See Comments) 10/21/2016  . Augmentin [amoxicillin-pot clavulanate] Other (See Comments) 03/03/2016  . Benicar [olmesartan] Other (See Comments) 06/08/2012  . Doxycycline Other (See Comments) 06/08/2012  . Eggs or egg-derived products  09/16/2017  . Influenza vaccine live Swelling 10/13/2010  . Invokana [canagliflozin] Other (See Comments) 03/03/2016    Family History  Problem Relation Age of Onset  . Stroke Other        paternal great grandfather  . Diabetes Father   . Hypertension Father   . Cancer Maternal Grandfather        adenocarcinoma  . Emphysema Maternal Grandfather        smoked  . Heart attack Paternal Grandfather   . Hypertension Paternal Grandmother   . Diabetes Paternal Grandmother   . Gout Paternal Grandmother   . Hypertension Maternal Grandmother   . Other Mother        pre-cancerous cells; had hyst  . Emphysema Mother        smoker  . Gout Brother   . Cancer Sister        cervical; had hyst  . Asthma Maternal  Uncle   . Colon cancer Neg Hx   . Liver disease Neg Hx   . Liver cancer Neg Hx     Social History   Socioeconomic History  . Marital status: Married    Spouse name: Not on file  . Number of children: Not on file  . Years of education: Not on file  . Highest education level: Not on file  Occupational History  . Occupation: nurse  Social Needs  . Financial resource strain: Not hard at all  . Food insecurity    Worry: Never true    Inability: Never true  . Transportation needs    Medical: No    Non-medical: No  Tobacco Use  . Smoking status: Former Smoker    Packs/day: 1.00    Years: 10.00    Pack years: 10.00    Types: Cigarettes    Quit date: 08/23/2017  Years since quitting: 0.8  . Smokeless tobacco: Never Used  Substance and Sexual Activity  . Alcohol use: Yes    Comment: occas  . Drug use: No  . Sexual activity: Yes    Birth control/protection: None  Lifestyle  . Physical activity    Days per week: Not on file    Minutes per session: Not on file  . Stress: Not on file  Relationships  . Social connections    Talks on phone: More than three times a week    Gets together: Once a week    Attends religious service: More than 4 times per year    Active member of club or organization: No    Attends meetings of clubs or organizations: Never    Relationship status: Married  . Intimate partner violence    Fear of current or ex partner: No    Emotionally abused: No    Physically abused: No    Forced sexual activity: No  Other Topics Concern  . Not on file  Social History Narrative   Is a nurse here at [facility]    Review of Systems: See HPI, otherwise negative ROS  Physical Exam: BP 117/79   Pulse 69   Temp 98.6 F (37 C) (Oral)   Ht 5' 9"  (1.753 m)   Wt 294 lb (133.4 kg)   LMP 06/27/2018   BMI 43.42 kg/m  General:   Alert,  pleasant and cooperative in NAD Neck:  Supple; no masses or thyromegaly. No significant cervical adenopathy. Lungs:  Clear  throughout to auscultation.   No wheezes, crackles, or rhonchi. No acute distress. Heart:  Regular rate and rhythm; no murmurs, clicks, rubs,  or gallops. Abdomen: Obese.  Positive bowel sounds.  Soft and nontender without appreciable mass organomegaly.Ppulses:  Normal pulses noted. Extremities:  Without clubbing or edema.  Impression/Plan: 33 year old morbidly obese lady with transaminitis likely due to steatohepatitis.  Upper lobe autoimmune markers.  Not to have been done wrap-up for evaluation.  Lengthy discussion about steatohepatitis  Right upper quadrant pain is fleeting and positional.  Likely musculoskeletal in origin.  Postcholecystectomy syndrome.  Appears not to be a major issue at this time.  Suboptimally controlled GERD and now she has esophageal dysphagia symptoms  Recommendations: I have offered the patient an EGD with potential esophageal dilation as feasible/appropriate  Will Schedule an EGD with dilation; propofol - GERD and dysphagia  Regular coffee consumption has been shown to help a fatty liver  Recommend better glycemic control.  Regular aerobic exercise 3 times weekly  Further recommendatons to follow    Notice: This dictation was prepared with Dragon dictation along with smaller phrase technology. Any transcriptional errors that result from this process are unintentional and may not be corrected upon review.

## 2018-07-02 NOTE — Patient Instructions (Signed)
Schedule an EGD with dilation; propofol - GERD and dysphagia  We will draw remaining labs on day of endoscopy.  Regular coffee consumption has been shown to help a fatty liver  Further recommendatons to follow

## 2018-07-05 ENCOUNTER — Other Ambulatory Visit: Payer: Self-pay

## 2018-07-05 ENCOUNTER — Telehealth: Payer: Self-pay

## 2018-07-05 DIAGNOSIS — R1310 Dysphagia, unspecified: Secondary | ICD-10-CM

## 2018-07-05 DIAGNOSIS — K219 Gastro-esophageal reflux disease without esophagitis: Secondary | ICD-10-CM

## 2018-07-05 DIAGNOSIS — R1319 Other dysphagia: Secondary | ICD-10-CM

## 2018-07-05 NOTE — Telephone Encounter (Signed)
Pt called office, procedure scheduled for 09/23/18 at 8:45am. Orders entered.

## 2018-07-05 NOTE — Telephone Encounter (Signed)
Tried to call pt to schedule EGD/DIL w/Propofol w/RMR, no answer, LMOVM for return call. 

## 2018-07-06 NOTE — Telephone Encounter (Signed)
Pre-op appt 09/20/18 at 8:00am. Appt letter mailed with procedure instructions.

## 2018-07-23 MED FILL — PANTOPRAZOLE SOD DR 40 MG T: 40 | 90 days supply | Qty: 180 | Fill #1

## 2018-07-23 MED FILL — HYDROCHLOROTHIAZIDE 12.5 MG: 12.5 | 90 days supply | Qty: 90 | Fill #1

## 2018-07-23 MED FILL — ALLOPURINOL 300 MG TAB: 300 | 90 days supply | Qty: 90 | Fill #1

## 2018-07-24 DIAGNOSIS — J9601 Acute respiratory failure with hypoxia: Secondary | ICD-10-CM | POA: Diagnosis not present

## 2018-07-27 ENCOUNTER — Telehealth: Payer: Self-pay

## 2018-07-27 NOTE — Telephone Encounter (Signed)
Patient called back. She is aware RMR is not available 9/17. Patient has r/s'd to 9/3 at 8:15am. She is aware I will mail new instructions/pre-op appt (confirmed mailing address). Called endo and LMOVM making aware of appt change

## 2018-07-27 NOTE — Telephone Encounter (Signed)
RMR will not be available 09/23/18, EGD/DIL w/Propofol will need to be rescheduled. Tried to call pt, no answer, LMOVM for return call.

## 2018-08-06 DIAGNOSIS — G4733 Obstructive sleep apnea (adult) (pediatric): Secondary | ICD-10-CM | POA: Diagnosis not present

## 2018-08-09 MED FILL — FREESTYLE LITE TEST STRIP: 25 days supply | Qty: 100 | Fill #2

## 2018-08-09 MED FILL — BUTALBITAL-APAP-CAFFEINE 50: 50-325-40 | 30 days supply | Qty: 30 | Fill #1

## 2018-08-09 MED FILL — HUMALOG 100 UNITS/ML KWIKPE: 100 | 30 days supply | Qty: 18 | Fill #2

## 2018-08-09 MED FILL — SOLIQUA 100 UNIT-33 MCG/ML: 100-33 | 25 days supply | Qty: 15 | Fill #2

## 2018-08-13 DIAGNOSIS — E119 Type 2 diabetes mellitus without complications: Secondary | ICD-10-CM | POA: Diagnosis not present

## 2018-08-13 DIAGNOSIS — E1165 Type 2 diabetes mellitus with hyperglycemia: Secondary | ICD-10-CM | POA: Diagnosis not present

## 2018-08-13 DIAGNOSIS — I10 Essential (primary) hypertension: Secondary | ICD-10-CM | POA: Diagnosis not present

## 2018-08-13 DIAGNOSIS — E782 Mixed hyperlipidemia: Secondary | ICD-10-CM | POA: Diagnosis not present

## 2018-08-24 DIAGNOSIS — J9601 Acute respiratory failure with hypoxia: Secondary | ICD-10-CM | POA: Diagnosis not present

## 2018-09-03 DIAGNOSIS — K219 Gastro-esophageal reflux disease without esophagitis: Secondary | ICD-10-CM | POA: Diagnosis not present

## 2018-09-03 DIAGNOSIS — E1165 Type 2 diabetes mellitus with hyperglycemia: Secondary | ICD-10-CM | POA: Diagnosis not present

## 2018-09-03 DIAGNOSIS — M1000 Idiopathic gout, unspecified site: Secondary | ICD-10-CM | POA: Diagnosis not present

## 2018-09-03 DIAGNOSIS — Z0001 Encounter for general adult medical examination with abnormal findings: Secondary | ICD-10-CM | POA: Diagnosis not present

## 2018-09-03 DIAGNOSIS — J4540 Moderate persistent asthma, uncomplicated: Secondary | ICD-10-CM | POA: Diagnosis not present

## 2018-09-03 DIAGNOSIS — F17200 Nicotine dependence, unspecified, uncomplicated: Secondary | ICD-10-CM | POA: Diagnosis not present

## 2018-09-03 DIAGNOSIS — J019 Acute sinusitis, unspecified: Secondary | ICD-10-CM | POA: Diagnosis not present

## 2018-09-03 DIAGNOSIS — R944 Abnormal results of kidney function studies: Secondary | ICD-10-CM | POA: Diagnosis not present

## 2018-09-03 DIAGNOSIS — E782 Mixed hyperlipidemia: Secondary | ICD-10-CM | POA: Diagnosis not present

## 2018-09-03 MED FILL — PRAVASTATIN SODIUM 20 MG TA: 20 | 90 days supply | Qty: 90 | Fill #0

## 2018-09-03 NOTE — Patient Instructions (Signed)
Cheryl Black  09/03/2018     @PREFPERIOPPHARMACY @   Your procedure is scheduled on  09/09/2018 .  [facility] at  0700   A.M.  Call this number if you have problems the morning of surgery:  [phone]   Remember:  Follow the diet instructions given to you by Dr Luvenia Starchourk's office.                      Take these medicines the morning of surgery with A SIP OF WATER  Allopurinol, fioricet(IF NEEDED), protonix. Use your nebulizer and your inhaler before you come.  TAKE 1/2 of your usual night time insulin the night before your procedure. DO NOT TAKE ANY MEDICATIONS FOR DIABETES THE DAY OF YOUR PROCEDURE.    Do not wear jewelry, make-up or nail polish.  Do not wear lotions, powders, or perfumes. Please wear deodorant and brush your teeth.  Do not shave 48 hours prior to surgery.  Men may shave face and neck.  Do not bring valuables to the hospital.  [facility] is not responsible for any belongings or valuables.  Contacts, dentures or bridgework may not be worn into surgery.  Leave your suitcase in the car.  After surgery it may be brought to your room.  For patients admitted to the hospital, discharge time will be determined by your treatment team.  Patients discharged the day of surgery will not be allowed to drive home.   Name and phone number of your driver:   family Special instructions:  SEE ABOVE  Please read over the following fact sheets that you were given. Anesthesia Post-op Instructions and Care and Recovery After Surgery       Upper Endoscopy, Adult, Care After This sheet gives you information about how to care for yourself after your procedure. Your health care provider may also give you more specific instructions. If you have problems or questions, contact your health care provider. What can I expect after the procedure? After the procedure, it is common to have:  A sore throat.  Mild stomach pain or discomfort.  Bloating.  Nausea.  Follow these instructions at home:   Follow instructions from your health care provider about what to eat or drink after your procedure.  Return to your normal activities as told by your health care provider. Ask your health care provider what activities are safe for you.  Take over-the-counter and prescription medicines only as told by your health care provider.  Do not drive for 24 hours if you were given a sedative during your procedure.  Keep all follow-up visits as told by your health care provider. This is important. Contact a health care provider if you have:  A sore throat that lasts longer than one day.  Trouble swallowing. Get help right away if:  You vomit blood or your vomit looks like coffee grounds.  You have: ? A fever. ? Bloody, black, or tarry stools. ? A severe sore throat or you cannot swallow. ? Difficulty breathing. ? Severe pain in your chest or abdomen. Summary  After the procedure, it is common to have a sore throat, mild stomach discomfort, bloating, and nausea.  Do not drive for 24 hours if you were given a sedative during the procedure.  Follow instructions from your health care provider about what to eat or drink after your procedure.  Return to your normal activities as told by your health  care provider. This information is not intended to replace advice given to you by your health care provider. Make sure you discuss any questions you have with your health care provider. Document Released: 06/24/2011 Document Revised: 06/16/2017 Document Reviewed: 05/25/2017 Elsevier Patient Education  2020 [employer]  Esophageal Dilatation Esophageal dilatation, also called esophageal dilation, is a procedure to widen or open (dilate) a blocked or narrowed part of the esophagus. The esophagus is the part of the body that moves food and liquid from the mouth to the stomach. You may need this procedure if:  You have a buildup of scar tissue in your  esophagus that makes it difficult, painful, or impossible to swallow. This can be caused by gastroesophageal reflux disease (GERD).  You have cancer of the esophagus.  There is a problem with how food moves through your esophagus. In some cases, you may need this procedure repeated at a later time to dilate the esophagus gradually. Tell a health care provider about:  Any allergies you have.  All medicines you are taking, including vitamins, herbs, eye drops, creams, and over-the-counter medicines.  Any problems you or family members have had with anesthetic medicines.  Any blood disorders you have.  Any surgeries you have had.  Any medical conditions you have.  Any antibiotic medicines you are required to take before dental procedures.  Whether you are pregnant or may be pregnant. What are the risks? Generally, this is a safe procedure. However, problems may occur, including:  Bleeding due to a tear in the lining of the esophagus.  A hole (perforation) in the esophagus. What happens before the procedure?  Follow instructions from your health care provider about eating or drinking restrictions.  Ask your health care provider about changing or stopping your regular medicines. This is especially important if you are taking diabetes medicines or blood thinners.  Plan to have someone take you home from the hospital or clinic.  Plan to have a responsible adult care for you for at least 24 hours after you leave the hospital or clinic. This is important. What happens during the procedure?  You may be given a medicine to help you relax (sedative).  A numbing medicine may be sprayed into the back of your throat, or you may gargle the medicine.  Your health care provider may perform the dilatation using various surgical instruments, such as: ? Simple dilators. This instrument is carefully placed in the esophagus to stretch it. ? Guided wire bougies. This involves using an endoscope  to insert a wire into the esophagus. A dilator is passed over this wire to enlarge the esophagus. Then the wire is removed. ? Balloon dilators. An endoscope with a small balloon at the end is inserted into the esophagus. The balloon is inflated to stretch the esophagus and open it up. The procedure may vary among health care providers and hospitals. What happens after the procedure?  Your blood pressure, heart rate, breathing rate, and blood oxygen level will be monitored until the medicines you were given have worn off.  Your throat may feel slightly sore and numb. This will improve slowly over time.  You will not be allowed to eat or drink until your throat is no longer numb.  When you are able to drink, urinate, and sit on the edge of the bed without nausea or dizziness, you may be able to return home. Follow these instructions at home:  Take over-the-counter and prescription medicines only as told by your health care  provider.  Do not drive for 24 hours if you were given a sedative during your procedure.  You should have a responsible adult with you for 24 hours after the procedure.  Follow instructions from your health care provider about any eating or drinking restrictions.  Do not use any products that contain nicotine or tobacco, such as cigarettes and e-cigarettes. If you need help quitting, ask your health care provider.  Keep all follow-up visits as told by your health care provider. This is important. Get help right away if you:  Have a fever.  Have chest pain.  Have pain that is not relieved by medication.  Have trouble breathing.  Have trouble swallowing.  Vomit blood. Summary  Esophageal dilatation, also called esophageal dilation, is a procedure to widen or open (dilate) a blocked or narrowed part of the esophagus.  Plan to have someone take you home from the hospital or clinic.  For this procedure, a numbing medicine may be sprayed into the back of your  throat, or you may gargle the medicine.  Do not drive for 24 hours if you were given a sedative during your procedure. This information is not intended to replace advice given to you by your health care provider. Make sure you discuss any questions you have with your health care provider. Document Released: 02/13/2005 Document Revised: 12/05/2016 Document Reviewed: 10/28/2016 Elsevier Patient Education  2020 Else[employer] Monitored Anesthesia Care, Care After These instructions provide you with information about caring for yourself after your procedure. Your health care provider may also give you more specific instructions. Your treatment has been planned according to current medical practices, but problems sometimes occur. Call your health care provider if you have any problems or questions after your procedure. What can I expect after the procedure? After your procedure, you may:  Feel sleepy for several hours.  Feel clumsy and have poor balance for several hours.  Feel forgetful about what happened after the procedure.  Have poor judgment for several hours.  Feel nauseous or vomit.  Have a sore throat if you had a breathing tube during the procedure. Follow these instructions at home: For at least 24 hours after the procedure:      Have a responsible adult stay with you. It is important to have someone help care for you until you are awake and alert.  Rest as needed.  Do not: ? Participate in activities in which you could fall or become injured. ? Drive. ? Use heavy machinery. ? Drink alcohol. ? Take sleeping pills or medicines that cause drowsiness. ? Make important decisions or sign legal documents. ? Take care of children on your own. Eating and drinking  Follow the diet that is recommended by your health care provider.  If you vomit, drink water, juice, or soup when you can drink without vomiting.  Make sure you have little or no nausea before eating solid foods.  General instructions  Take over-the-counter and prescription medicines only as told by your health care provider.  If you have sleep apnea, surgery and certain medicines can increase your risk for breathing problems. Follow instructions from your health care provider about wearing your sleep device: ? Anytime you are sleeping, including during daytime naps. ? While taking prescription pain medicines, sleeping medicines, or medicines that make you drowsy.  If you smoke, do not smoke without supervision.  Keep all follow-up visits as told by your health care provider. This is important. Contact a health care provider if:  You keep feeling nauseous or you keep vomiting.  You feel light-headed.  You develop a rash.  You have a fever. Get help right away if:  You have trouble breathing. Summary  For several hours after your procedure, you may feel sleepy and have poor judgment.  Have a responsible adult stay with you for at least 24 hours or until you are awake and alert. This information is not intended to replace advice given to you by your health care provider. Make sure you discuss any questions you have with your health care provider. Document Released: 04/15/2015 Document Revised: 03/23/2017 Document Reviewed: 04/15/2015 Elsevier Patient Education  2020 [employer].

## 2018-09-07 ENCOUNTER — Encounter (HOSPITAL_COMMUNITY): Payer: Self-pay

## 2018-09-07 ENCOUNTER — Other Ambulatory Visit (HOSPITAL_COMMUNITY)
Admission: RE | Admit: 2018-09-07 | Discharge: 2018-09-07 | Disposition: A | Discharge status: Home / Self Care | Payer: 59 | Source: Ambulatory Visit | Attending: Internal Medicine | Admitting: Internal Medicine

## 2018-09-07 ENCOUNTER — Other Ambulatory Visit: Payer: Self-pay

## 2018-09-07 ENCOUNTER — Encounter (HOSPITAL_COMMUNITY)
Admission: RE | Admit: 2018-09-07 | Discharge: 2018-09-07 | Disposition: A | Discharge status: Home / Self Care | Payer: 59 | Source: Ambulatory Visit | Attending: Internal Medicine | Admitting: Internal Medicine

## 2018-09-07 DIAGNOSIS — Z87891 Personal history of nicotine dependence: Secondary | ICD-10-CM | POA: Diagnosis not present

## 2018-09-07 DIAGNOSIS — K219 Gastro-esophageal reflux disease without esophagitis: Secondary | ICD-10-CM | POA: Diagnosis present

## 2018-09-07 DIAGNOSIS — Z79899 Other long term (current) drug therapy: Secondary | ICD-10-CM | POA: Diagnosis not present

## 2018-09-07 DIAGNOSIS — K210 Gastro-esophageal reflux disease with esophagitis: Secondary | ICD-10-CM | POA: Diagnosis not present

## 2018-09-07 DIAGNOSIS — R1310 Dysphagia, unspecified: Secondary | ICD-10-CM | POA: Diagnosis not present

## 2018-09-07 DIAGNOSIS — Z794 Long term (current) use of insulin: Secondary | ICD-10-CM | POA: Diagnosis not present

## 2018-09-07 DIAGNOSIS — M109 Gout, unspecified: Secondary | ICD-10-CM | POA: Diagnosis not present

## 2018-09-07 DIAGNOSIS — Z6841 Body Mass Index (BMI) 40.0 and over, adult: Secondary | ICD-10-CM | POA: Diagnosis not present

## 2018-09-07 DIAGNOSIS — J45909 Unspecified asthma, uncomplicated: Secondary | ICD-10-CM | POA: Diagnosis not present

## 2018-09-07 DIAGNOSIS — E119 Type 2 diabetes mellitus without complications: Secondary | ICD-10-CM | POA: Diagnosis not present

## 2018-09-07 DIAGNOSIS — G4730 Sleep apnea, unspecified: Secondary | ICD-10-CM | POA: Diagnosis not present

## 2018-09-07 DIAGNOSIS — Z79891 Long term (current) use of opiate analgesic: Secondary | ICD-10-CM | POA: Diagnosis not present

## 2018-09-07 HISTORY — DX: Sleep apnea, unspecified: G47.30

## 2018-09-07 LAB — CBC
HCT: 45.6 % (ref 36.0–46.0)
Hemoglobin: 14.8 g/dL (ref 12.0–15.0)
MCH: 29.4 pg (ref 26.0–34.0)
MCHC: 32.5 g/dL (ref 30.0–36.0)
MCV: 90.7 fL (ref 80.0–100.0)
Platelets: 303 10*3/uL (ref 150–400)
RBC: 5.03 MIL/uL (ref 3.87–5.11)
RDW: 14 % (ref 11.5–15.5)
WBC: 12.7 10*3/uL — ABNORMAL HIGH (ref 4.0–10.5)
nRBC: 0 % (ref 0.0–0.2)

## 2018-09-07 LAB — COMPREHENSIVE METABOLIC PANEL
ALT: 88 U/L — ABNORMAL HIGH (ref 0–44)
AST: 49 U/L — ABNORMAL HIGH (ref 15–41)
Albumin: 3.7 g/dL (ref 3.5–5.0)
Alkaline Phosphatase: 92 U/L (ref 38–126)
Anion gap: 9 (ref 5–15)
BUN: 10 mg/dL (ref 6–20)
CO2: 23 mmol/L (ref 22–32)
Calcium: 8.7 mg/dL — ABNORMAL LOW (ref 8.9–10.3)
Chloride: 103 mmol/L (ref 98–111)
Creatinine, Ser: 0.56 mg/dL (ref 0.44–1.00)
GFR calc Af Amer: >60 mL/min (ref >60)
GFR calc non Af Amer: >60 mL/min (ref >60)
Glucose, Bld: 200 mg/dL — ABNORMAL HIGH (ref 70–99)
Potassium: 3.4 mmol/L — ABNORMAL LOW (ref 3.5–5.1)
Sodium: 135 mmol/L (ref 135–145)
Total Bilirubin: 0.3 mg/dL (ref 0.3–1.2)
Total Protein: 7 g/dL (ref 6.5–8.1)

## 2018-09-07 LAB — SARS CORONAVIRUS 2 (TAT 6-24 HRS): SARS Coronavirus 2: NEGATIVE

## 2018-09-07 LAB — HCG, QUANTITATIVE, PREGNANCY: hCG, Beta Chain, Quant, S: <1 m[IU]/mL (ref <5)

## 2018-09-09 ENCOUNTER — Encounter (HOSPITAL_COMMUNITY)
Admission: RE | Disposition: A | Discharge status: Home / Self Care | Payer: Self-pay | Source: Home / Self Care | Attending: Internal Medicine

## 2018-09-09 ENCOUNTER — Ambulatory Visit (HOSPITAL_COMMUNITY): Payer: 59 | Admitting: Anesthesiology

## 2018-09-09 ENCOUNTER — Ambulatory Visit (HOSPITAL_COMMUNITY)
Admission: RE | Admit: 2018-09-09 | Discharge: 2018-09-09 | Disposition: A | Discharge status: Home / Self Care | Payer: 59 | Attending: Internal Medicine | Admitting: Internal Medicine

## 2018-09-09 DIAGNOSIS — E119 Type 2 diabetes mellitus without complications: Secondary | ICD-10-CM | POA: Diagnosis not present

## 2018-09-09 DIAGNOSIS — Z6841 Body Mass Index (BMI) 40.0 and over, adult: Secondary | ICD-10-CM | POA: Insufficient documentation

## 2018-09-09 DIAGNOSIS — Z794 Long term (current) use of insulin: Secondary | ICD-10-CM | POA: Insufficient documentation

## 2018-09-09 DIAGNOSIS — K210 Gastro-esophageal reflux disease with esophagitis: Secondary | ICD-10-CM | POA: Diagnosis not present

## 2018-09-09 DIAGNOSIS — J45909 Unspecified asthma, uncomplicated: Secondary | ICD-10-CM | POA: Diagnosis not present

## 2018-09-09 DIAGNOSIS — M109 Gout, unspecified: Secondary | ICD-10-CM | POA: Diagnosis not present

## 2018-09-09 DIAGNOSIS — G4730 Sleep apnea, unspecified: Secondary | ICD-10-CM | POA: Insufficient documentation

## 2018-09-09 DIAGNOSIS — Z79891 Long term (current) use of opiate analgesic: Secondary | ICD-10-CM | POA: Diagnosis not present

## 2018-09-09 DIAGNOSIS — Z79899 Other long term (current) drug therapy: Secondary | ICD-10-CM | POA: Diagnosis not present

## 2018-09-09 DIAGNOSIS — R1310 Dysphagia, unspecified: Secondary | ICD-10-CM | POA: Insufficient documentation

## 2018-09-09 DIAGNOSIS — Z87891 Personal history of nicotine dependence: Secondary | ICD-10-CM | POA: Insufficient documentation

## 2018-09-09 HISTORY — PX: ESOPHAGOGASTRODUODENOSCOPY (EGD) WITH PROPOFOL: SHX5813

## 2018-09-09 HISTORY — PX: MALONEY DILATION: SHX5535

## 2018-09-09 LAB — GLUCOSE, CAPILLARY: Glucose-Capillary: 120 mg/dL — ABNORMAL HIGH (ref 70–99)

## 2018-09-09 SURGERY — ESOPHAGOGASTRODUODENOSCOPY (EGD) WITH PROPOFOL
Anesthesia: General

## 2018-09-09 MED ORDER — KETAMINE HCL 10 MG/ML IJ SOLN
INTRAMUSCULAR | Status: DC | PRN
  Administered 2018-09-09: 30 mg via INTRAVENOUS

## 2018-09-09 MED ORDER — KETAMINE HCL 50 MG/5ML IJ SOSY
PREFILLED_SYRINGE | INTRAMUSCULAR | Status: AC
  Filled 2018-09-09: qty 5

## 2018-09-09 MED ORDER — MIDAZOLAM HCL 2 MG/2ML IJ SOLN
INTRAMUSCULAR | Status: AC
  Filled 2018-09-09: qty 2

## 2018-09-09 MED ORDER — MIDAZOLAM HCL 2 MG/2ML IJ SOLN
0.5000 mg | Freq: Once | INTRAMUSCULAR | Status: DC | PRN
  Administered 2018-09-09: 2 mg via INTRAVENOUS

## 2018-09-09 MED ORDER — PROMETHAZINE HCL 25 MG/ML IJ SOLN: 6.2500 mg | INTRAMUSCULAR | Status: DC | PRN

## 2018-09-09 MED ORDER — PROPOFOL 10 MG/ML IV BOLUS
INTRAVENOUS | Status: AC
  Filled 2018-09-09: qty 40

## 2018-09-09 MED ORDER — HYDROCODONE-ACETAMINOPHEN 7.5-325 MG PO TABS: 1.0000 | ORAL_TABLET | Freq: Once | ORAL | Status: DC | PRN

## 2018-09-09 MED ORDER — PROPOFOL 10 MG/ML IV BOLUS
INTRAVENOUS | Status: DC | PRN
  Administered 2018-09-09: 30 mg via INTRAVENOUS

## 2018-09-09 MED ORDER — LACTATED RINGERS IV SOLN
INTRAVENOUS | Status: DC
  Administered 2018-09-09: 08:00:00 via INTRAVENOUS

## 2018-09-09 MED ORDER — HYDROMORPHONE HCL 1 MG/ML IJ SOLN: 0.2500 mg | INTRAMUSCULAR | Status: DC | PRN

## 2018-09-09 MED ORDER — PROPOFOL 500 MG/50ML IV EMUL
INTRAVENOUS | Status: DC | PRN
  Administered 2018-09-09: 150 ug/kg/min via INTRAVENOUS

## 2018-09-09 NOTE — H&P (Signed)
@LOGO @   Primary Care Physician:  [name], MD Primary Gastroenterologist:  Dr. Gala Romney  Pre-Procedure History & Physical: HPI:  Cheryl Black is a 33 y.o. female here for further evaluation of GERD well-controlled on once daily PPI Esophageal dysphagia.  Past Medical History:  Diagnosis Date  . Acid reflux   . Asthma   . Clostridium difficile infection    in the past  . Diabetes mellitus   . Fatty liver   . Fibrocystic breast disease   . Gout   . Hidradenitis   . History of kidney stones   . Hyperlipemia   . Obesity   . Polycystic ovary syndrome   . PONV (postoperative nausea and vomiting)   . Sleep apnea   . Smoker     Past Surgical History:  Procedure Laterality Date  . CHOLECYSTECTOMY  01/10/2011   Procedure: LAPAROSCOPIC CHOLECYSTECTOMY;  Surgeon: Jamesetta So;  Location: [facility];  Service: General;  Laterality: N/A;  . HYDRADENITIS EXCISION Right 06/18/2012   Procedure: EXCISION HYDRIADENITIS SUPRATIVA  RIGHT AXILLA;  Surgeon: Scherry Ran, MD;  Location: [facility];  Service: General;  Laterality: Right;  . LIVER BIOPSY  01/10/2011   Procedure: LIVER BIOPSY;  Surgeon: Jamesetta So;  Location: [facility];  Service: General;;  . SKIN SPLIT GRAFT Right 06/18/2012   Procedure: SKIN GRAFT SPLIT THICKNESS RIGHT AXILLA(WITH TWO STANDARD SKIN BOARDS 3"x 8" )  DONOR SITE RIGHT AND LEFT THIGHS;  Surgeon: Scherry Ran, MD;  Location: [facility];  Service: General;  Laterality: Right;    Prior to Admission medications   Medication Sig Start Date End Date Taking? Authorizing Provider  acetaminophen (TYLENOL) 500 MG tablet Take 1,000 mg by mouth every 6 (six) hours as needed (for pain/headaches.).    Yes [provider]  albuterol (PROVENTIL HFA;VENTOLIN HFA) 108 (90 Base) MCG/ACT inhaler Inhale 2 puffs into the lungs every 6 (six) hours as needed for wheezing or shortness of breath. 08/31/17  Yes Isaac Bliss, Rayford Halsted, MD  albuterol (PROVENTIL) (5 MG/ML) 0.5%  nebulizer solution Take 0.5 mLs (2.5 mg total) by nebulization every 6 (six) hours as needed for wheezing or shortness of breath. 08/23/17  Yes Jacqlyn Larsen, PA-C  allopurinol (ZYLOPRIM) 300 MG tablet Take 300 mg by mouth daily.    Yes [provider]  butalbital-acetaminophen-caffeine (FIORICET, ESGIC) 50-325-40 MG tablet Take 1 tablet by mouth daily as needed (headaches.).  01/21/18  Yes [provider]  Calcium Carb-Cholecalciferol [phone] MG-UNIT CAPS Take 2 capsules by mouth every evening.   Yes [provider]  calcium carbonate (TUMS CHEWY BITES) 750 MG chewable tablet Chew 1 tablet by mouth as needed for heartburn.   Yes [provider]  fexofenadine (ALLEGRA) 180 MG tablet Take 180 mg by mouth daily.    Yes [provider]  fluticasone (FLONASE) 50 MCG/ACT nasal spray Place 2 sprays into both nostrils daily.    Yes [provider]  HUMALOG KWIKPEN 100 UNIT/ML KwikPen Inject 10-22 Units into the skin 4 (four) times daily. Sliding Scale Insulin 06/08/18  Yes [provider]  hydrochlorothiazide (MICROZIDE) 12.5 MG capsule Take 1 capsule (12.5 mg total) by mouth daily. 02/17/18  Yes Jonnie Kind, MD  Insulin Glargine-Lixisenatide (SOLIQUA) 100-33 UNT-MCG/ML SOPN Inject 60 Units into the skin at bedtime.    Yes [provider]  ipratropium (ATROVENT) 0.02 % nebulizer solution Take 0.5 mg by nebulization every 4 (four) hours as needed for wheezing or  shortness of breath.   Yes [provider]  KOMBIGLYZE XR 2.05-998 MG TB24 Take 1 tablet by mouth 2 (two) times daily.  02/13/16  Yes [provider]  pantoprazole (PROTONIX) 40 MG tablet Take 1 tablet (40 mg total) by mouth daily. 09/01/17  Yes Philip Aspen, Limmie Patricia, MD  predniSONE (DELTASONE) 10 MG tablet Take 6 tablets (60 mg total) by mouth daily with breakfast. And decrease by one tablet daily. Patient not taking: Reported on 07/02/2018 03/25/18   Catarina Hartshorn, MD    Allergies as of 07/05/2018 - Review Complete 07/02/2018  Allergen Reaction Noted  . Ace inhibitors Anaphylaxis 03/09/2016  . Beta adrenergic blockers Other (See Comments) 10/21/2016  . Augmentin [amoxicillin-pot clavulanate] Other (See Comments) 03/03/2016  . Benicar [olmesartan] Other (See Comments) 06/08/2012  . Doxycycline Other (See Comments) 06/08/2012  . Eggs or egg-derived products  09/16/2017  . Influenza vaccine live Swelling 10/13/2010  . Invokana [canagliflozin] Other (See Comments) 03/03/2016    Family History  Problem Relation Age of Onset  . Stroke Other        paternal great grandfather  . Diabetes Father   . Hypertension Father   . Cancer Maternal Grandfather        adenocarcinoma  . Emphysema Maternal Grandfather        smoked  . Heart attack Paternal Grandfather   . Hypertension Paternal Grandmother   . Diabetes Paternal Grandmother   . Gout Paternal Grandmother   . Hypertension Maternal Grandmother   . Other Mother        pre-cancerous cells; had hyst  . Emphysema Mother        smoker  . Gout Brother   . Cancer Sister        cervical; had hyst  . Asthma Maternal Uncle   . Colon cancer Neg Hx   . Liver disease Neg Hx   . Liver cancer Neg Hx     Social History   Socioeconomic History  . Marital status: Married    Spouse name: Not on file  . Number of children: Not on file  . Years of education: Not on file  . Highest education level: Not on file  Occupational History  . Occupation: nurse  Social Needs  . Financial resource strain: Not hard at all  . Food insecurity    Worry: Never true    Inability: Never true  . Transportation needs    Medical: No    Non-medical: No  Tobacco Use  . Smoking status: Former Smoker    Packs/day: 1.00    Years: 10.00    Pack years: 10.00    Types: Cigarettes    Quit date: 08/23/2017    Years since quitting: 1.0  . Smokeless tobacco: Never Used  Substance and Sexual Activity  . Alcohol  use: Yes    Comment: occas  . Drug use: No  . Sexual activity: Yes    Birth control/protection: None  Lifestyle  . Physical activity    Days per week: Not on file    Minutes per session: Not on file  . Stress: Not on file  Relationships  . Social connections    Talks on phone: More than three times a week    Gets together: Once a week    Attends religious service: More than 4 times per year    Active member of club or organization: No    Attends meetings of clubs or organizations: Never    Relationship  status: Married  . Intimate partner violence    Fear of current or ex partner: No    Emotionally abused: No    Physically abused: No    Forced sexual activity: No  Other Topics Concern  . Not on file  Social History Narrative   [facility] a [facility]    Review of Systems: See HPI, otherwise negative ROS  Physical Exam: LMP 08/23/2018  General:   Alert,  Well-developed, well-nourished, pleasant and cooperative in NAD Neck:  Supple; no masses or thyromegaly. No significant cervical adenopathy. Lungs:  Clear throughout to auscultation.   No wheezes, crackles, or rhonchi. No acute distress. Heart:  Regular rate and rhythm; no murmurs, clicks, rubs,  or gallops. Abdomen: Non-distended, normal bowel sounds.  Soft and nontender without appreciable mass or hepatosplenomegaly.  Pulses:  Normal pulses noted. Extremities:  Without clubbing or edema.  Impression/Plan: 33 year old lady with poorly controlled GERD now with esophageal dysphagia.  Here for EGD with esophageal dilation as feasible/appropriate. The risks, benefits, limitations, alternatives and imponderables have been reviewed with the patient. Potential for esophageal dilation, biopsy, etc. have also been reviewed.  Questions have been answered. All parties agreeable.     Notice: This dictation was prepared with Dragon dictation along with smaller phrase technology. Any transcriptional errors that result from this  process are unintentional and may not be corrected upon review.

## 2018-09-09 NOTE — Anesthesia Postprocedure Evaluation (Signed)
Anesthesia Post Note  Patient: Cheryl Black  Procedure(s) Performed: ESOPHAGOGASTRODUODENOSCOPY (EGD) WITH PROPOFOL (N/A ) MALONEY DILATION (N/A )  Patient location during evaluation: PACU Anesthesia Type: MAC Level of consciousness: awake, awake and alert, oriented and patient cooperative Pain management: pain level controlled Vital Signs Assessment: post-procedure vital signs reviewed and stable Respiratory status: spontaneous breathing, nonlabored ventilation and respiratory function stable Cardiovascular status: stable Postop Assessment: no apparent nausea or vomiting Anesthetic complications: no     Last Vitals:  Vitals:   09/09/18 0747 09/09/18 0829  BP: 124/81 (!) 97/55  Pulse: 67 86  Resp: 18 (!) 23  Temp: 36.6 C 36.6 C  SpO2: 97% 93%    Last Pain:  Vitals:   09/09/18 0829  TempSrc:   PainSc: 0-No pain                 Darielys Giglia

## 2018-09-09 NOTE — Op Note (Signed)
[facility] Patient Name: Cheryl Black Procedure Date: 09/09/2018 8:12 AM MRN: [id number] Date of Birth: Feb 24, 1985 Attending MD: Norvel Richards , MD CSN: [id number] Age: 33 Admit Type: Outpatient Procedure:                Upper GI endoscopy Indications:              Dysphagia Providers:                Norvel Richards, MD, Jeanann Lewandowsky. Sharon Seller, RN,                            Aram Candela Referring MD:             Edwinna Areola. Hall MD Medicines:                Propofol per Anesthesia Complications:            No immediate complications. Estimated Blood Loss:     Estimated blood loss: none. Procedure:                Pre-Anesthesia Assessment:                           - Prior to the procedure, a History and Physical                            was performed, and patient medications and                            allergies were reviewed. The patient's tolerance of                            previous anesthesia was also reviewed. The risks                            and benefits of the procedure and the sedation                            options and risks were discussed with the patient.                            All questions were answered, and informed consent                            was obtained. Prior Anticoagulants: The patient has                            taken no previous anticoagulant or antiplatelet                            agents. ASA Grade Assessment: II - A patient with                            mild systemic disease. After reviewing the risks  and benefits, the patient was deemed in                            satisfactory condition to undergo the procedure.                           After obtaining informed consent, the endoscope was                            passed under direct vision. Throughout the                            procedure, the patient's blood pressure, pulse, and                            oxygen saturations were  monitored continuously. The                            GIF-H190 ([id number]) scope was introduced through the                            mouth, and advanced to the second part of duodenum.                            The upper GI endoscopy was accomplished without                            difficulty. The patient tolerated the procedure                            well. Scope In: 8:15:41 AM Scope Out: 8:23:32 AM Total Procedure Duration: 0 hours 7 minutes 51 seconds  Findings:      Circumferential distal esophageal erosions within 5 mm of the GE       junction. No Barrett's epithelium seen. Tubular esophagus appeared       patent throughout its course.      Stomach appeared normal. .      The duodenal bulb and second portion of the duodenum were normal. Scope       removed, 5 to 6 [place] Maloney passed with mild resistance.       Subsequently, 15 [place] Elease Hashimoto was passed with mild resistance. Look       back revealed no apparent complication to these maneuvers. Impression:               -Mild erosive reflux esophagitis status post                            Maloney dilation. Normal stomach.                           - Normal duodenal bulb and second portion of the                            duodenum.                           -  No specimens collected. Moderate Sedation:      Moderate (conscious) sedation was personally administered by an       anesthesia professional. The following parameters were monitored: oxygen       saturation, heart rate, blood pressure, respiratory rate, EKG, adequacy       of pulmonary ventilation, and response to care. Recommendation:           - Patient has a contact number available for                            emergencies. The signs and symptoms of potential                            delayed complications were discussed with the                            patient. Return to normal activities tomorrow.                            Written discharge  instructions were provided to the                            patient.                           - Continue present medications. Increase Protonix                            to 40 mg twice daily. Office visit with us in 3                            months. Procedure Code(s):        --- Professional ---                           [phone], Esophagogastroduodenoscopy, flexible,                            transoral; diagnostic, including collection of                            specimen(s) by brushing or washing, when performed                            (separate procedure) Diagnosis Code(s):        --- Professional ---                           R13.10, Dysphagia, unspecified CPT copyright 2019 American Medical Association. All rights reserved. The codes documented in this report are preliminary and upon coder review may  be revised to meet current compliance requirements. Gerrit Friendsobert M. Denny Lave, MD Gennette Pacobert Michael Alayha Babineaux, MD 09/09/2018 8:39:08 AM This report has been signed electronically. Number of Addenda: 0

## 2018-09-09 NOTE — Transfer of Care (Signed)
Immediate Anesthesia Transfer of Care Note  Patient: Cheryl Black  Procedure(s) Performed: ESOPHAGOGASTRODUODENOSCOPY (EGD) WITH PROPOFOL (N/A ) MALONEY DILATION (N/A )  Patient Location: PACU  Anesthesia Type:MAC  Level of Consciousness: awake, alert  and oriented  Airway & Oxygen Therapy: Patient Spontanous Breathing  Post-op Assessment: Report given to RN and Post -op Vital signs reviewed and stable  Post vital signs: Reviewed and stable  Last Vitals:  Vitals Value Taken Time  BP 97/55 09/09/18 0830  Temp 36.6 C 09/09/18 0829  Pulse 75 09/09/18 0836  Resp 24 09/09/18 0836  SpO2 92 % 09/09/18 0836  Vitals shown include unvalidated device data.  Last Pain:  Vitals:   09/09/18 0829  TempSrc:   PainSc: 0-No pain      Patients Stated Pain Goal: 5 (27/07/86 7544)  Complications: No apparent anesthesia complications

## 2018-09-09 NOTE — Discharge Instructions (Signed)
EGD Discharge instructions Please read the instructions outlined below and refer to this sheet in the next few weeks. These discharge instructions provide you with general information on caring for yourself after you leave the hospital. Your doctor may also give you specific instructions. While your treatment has been planned according to the most current medical practices available, unavoidable complications occasionally occur. If you have any problems or questions after discharge, please call your doctor. ACTIVITY  You may resume your regular activity but move at a slower pace for the next 24 hours.   Take frequent rest periods for the next 24 hours.   Walking will help expel (get rid of) the air and reduce the bloated feeling in your abdomen.   No driving for 24 hours (because of the anesthesia (medicine) used during the test).   You may shower.   Do not sign any important legal documents or operate any machinery for 24 hours (because of the anesthesia used during the test).  NUTRITION  Drink plenty of fluids.   You may resume your normal diet.   Begin with a light meal and progress to your normal diet.   Avoid alcoholic beverages for 24 hours or as instructed by your caregiver.  MEDICATIONS  You may resume your normal medications unless your caregiver tells you otherwise.  WHAT YOU CAN EXPECT TODAY  You may experience abdominal discomfort such as a feeling of fullness or gas pains.  FOLLOW-UP  Your doctor will discuss the results of your test with you.  SEEK IMMEDIATE MEDICAL ATTENTION IF ANY OF THE FOLLOWING OCCUR:  Excessive nausea (feeling sick to your stomach) and/or vomiting.   Severe abdominal pain and distention (swelling).   Trouble swallowing.   Temperature over 101 F (37.8 C).   Rectal bleeding or vomiting of blood.     Gastroesophageal Reflux Disease, Adult Gastroesophageal reflux (GER) happens when acid from the stomach flows up into the tube that  connects the mouth and the stomach (esophagus). Normally, food travels down the esophagus and stays in the stomach to be digested. With GER, food and stomach acid sometimes move back up into the esophagus. You may have a disease called gastroesophageal reflux disease (GERD) if the reflux: Happens often. Causes frequent or very bad symptoms. Causes problems such as damage to the esophagus. When this happens, the esophagus becomes sore and swollen (inflamed). Over time, GERD can make small holes (ulcers) in the lining of the esophagus. What are the causes? This condition is caused by a problem with the muscle between the esophagus and the stomach. When this muscle is weak or not normal, it does not close properly to keep food and acid from coming back up from the stomach. The muscle can be weak because of: Tobacco use. Pregnancy. Having a certain type of hernia (hiatal hernia). Alcohol use. Certain foods and drinks, such as coffee, chocolate, onions, and peppermint. What increases the risk? You are more likely to develop this condition if you: Are overweight. Have a disease that affects your connective tissue. Use NSAID medicines. What are the signs or symptoms? Symptoms of this condition include: Heartburn. Difficult or painful swallowing. The feeling of having a lump in the throat. A bitter taste in the mouth. Bad breath. Having a lot of saliva. Having an upset or bloated stomach. Belching. Chest pain. Different conditions can cause chest pain. Make sure you see your doctor if you have chest pain. Shortness of breath or noisy breathing (wheezing). Ongoing (chronic) cough or a  cough at night. Wearing away of the surface of teeth (tooth enamel). Weight loss. How is this treated? Treatment will depend on how bad your symptoms are. Your doctor may suggest: Changes to your diet. Medicine. Surgery. Follow these instructions at home: Eating and drinking  Follow a diet as told by your  doctor. You may need to avoid foods and drinks such as: Coffee and tea (with or without caffeine). Drinks that contain alcohol. Energy drinks and sports drinks. Bubbly (carbonated) drinks or sodas. Chocolate and cocoa. Peppermint and mint flavorings. Garlic and onions. Horseradish. Spicy and acidic foods. These include peppers, chili powder, curry powder, vinegar, hot sauces, and BBQ sauce. Citrus fruit juices and citrus fruits, such as oranges, lemons, and limes. Tomato-based foods. These include red sauce, chili, salsa, and pizza with red sauce. Fried and fatty foods. These include donuts, french fries, potato chips, and high-fat dressings. High-fat meats. These include hot dogs, rib eye steak, sausage, ham, and bacon. High-fat dairy items, such as whole milk, butter, and cream cheese. Eat small meals often. Avoid eating large meals. Avoid drinking large amounts of liquid with your meals. Avoid eating meals during the 2-3 hours before bedtime. Avoid lying down right after you eat. Do not exercise right after you eat. Lifestyle  Do not use any products that contain nicotine or tobacco. These include cigarettes, e-cigarettes, and chewing tobacco. If you need help quitting, ask your doctor. Try to lower your stress. If you need help doing this, ask your doctor. If you are overweight, lose an amount of weight that is healthy for you. Ask your doctor about a safe weight loss goal. General instructions Pay attention to any changes in your symptoms. Take over-the-counter and prescription medicines only as told by your doctor. Do not take aspirin, ibuprofen, or other NSAIDs unless your doctor says it is okay. Wear loose clothes. Do not wear anything tight around your waist. Raise (elevate) the head of your bed about 6 inches (15 cm). Avoid bending over if this makes your symptoms worse. Keep all follow-up visits as told by your doctor. This is important. Contact a doctor if: You have new  symptoms. You lose weight and you do not know why. You have trouble swallowing or it hurts to swallow. You have wheezing or a cough that keeps happening. Your symptoms do not get better with treatment. You have a hoarse voice. Get help right away if: You have pain in your arms, neck, jaw, teeth, or back. You feel sweaty, dizzy, or light-headed. You have chest pain or shortness of breath. You throw up (vomit) and your throw-up looks like blood or coffee grounds. You pass out (faint). Your poop (stool) is bloody or black. You cannot swallow, drink, or eat. Summary If a person has gastroesophageal reflux disease (GERD), food and stomach acid move back up into the esophagus and cause symptoms or problems such as damage to the esophagus. Treatment will depend on how bad your symptoms are. Follow a diet as told by your doctor. Take all medicines only as told by your doctor. This information is not intended to replace advice given to you by your health care provider. Make sure you discuss any questions you have with your health care provider. Document Released: 06/11/2007 Document Revised: 07/01/2017 Document Reviewed: 07/01/2017 Elsevier Patient Education  2020 [employer].   GERD information provided  Increase Protonix to 40 mg twice daily for the next 3 months  Office visit with [name] in 3 months  I left  a message with husband, Johnika Escareno, [phone] on voicemail at patient's request.

## 2018-09-09 NOTE — Anesthesia Preprocedure Evaluation (Signed)
Anesthesia Evaluation  Patient identified by MRN, date of birth, ID band Patient awake    Reviewed: Allergy & Precautions, NPO status , Patient's Chart, lab work & pertinent test results  History of Anesthesia Complications (+) PONV  Airway Mallampati: II  TM Distance: >3 FB Neck ROM: Full    Dental no notable dental hx. (+) Teeth Intact   Pulmonary asthma , sleep apnea and Continuous Positive Airway Pressure Ventilation , pneumonia, resolved, Current SmokerPatient did not abstain from smoking., former smoker,  Reports two bouts with double pneumonia in 2020, stopped with cigs,  but continues to [employer] today  States breathing at baseline this am   Pulmonary exam normal breath sounds clear to auscultation       Cardiovascular Exercise Tolerance: Good + DOE  Normal cardiovascular examI Rhythm:Regular Rate:Normal     Neuro/Psych negative neurological ROS  negative psych ROS   GI/Hepatic Neg liver ROS, GERD  Medicated and Controlled,  Endo/Other  diabetes, Well Controlled, Type 1, Insulin DependentMorbid obesityBMI >43  Renal/GU negative Renal ROS  negative genitourinary   Musculoskeletal negative musculoskeletal ROS (+)   Abdominal   Peds negative pediatric ROS (+)  Hematology negative hematology ROS (+)   Anesthesia Other Findings   Reproductive/Obstetrics negative OB ROS                             Anesthesia Physical Anesthesia Plan  ASA: III  Anesthesia Plan: General   Post-op Pain Management:    Induction: Intravenous  PONV Risk Score and Plan: 4 or greater and Propofol infusion, TIVA, Midazolam, Ondansetron and Treatment may vary due to age or medical condition  Airway Management Planned: Simple Face Mask and Nasal Cannula  Additional Equipment:   Intra-op Plan:   Post-operative Plan:   Informed Consent: I have reviewed the patients History and Physical, chart,  labs and discussed the procedure including the risks, benefits and alternatives for the proposed anesthesia with the patient or authorized representative who has indicated his/her understanding and acceptance.     Dental advisory given  Plan Discussed with: CRNA  Anesthesia Plan Comments: (Plan Full PPE use  Plan GA with GETA as needed d/w pt -WTP with same after Q&A  All times with the exception of Anesthesia start, Handoff and Anesthesia stop are not precise to the minute,but approximate   to the event. This chart was not reviewed to correct artifact data errors inherent with the system.)        Anesthesia Quick Evaluation

## 2018-09-15 ENCOUNTER — Encounter (HOSPITAL_COMMUNITY): Payer: Self-pay | Admitting: Internal Medicine

## 2018-09-20 ENCOUNTER — Other Ambulatory Visit (HOSPITAL_COMMUNITY): Payer: 59

## 2018-09-24 DIAGNOSIS — J9601 Acute respiratory failure with hypoxia: Secondary | ICD-10-CM | POA: Diagnosis not present

## 2018-10-08 MED FILL — UNIFINE PENTIPS 31GX3/16: 31G X 5 MM | 20 days supply | Qty: 100 | Fill #2

## 2018-10-08 MED FILL — FREESTYLE LITE TEST STRIP: 25 days supply | Qty: 100 | Fill #0

## 2018-10-08 MED FILL — UNIFINE PENTIPS 31GX3/16": 31G X 5 MM | 20 days supply | Qty: 100 | Fill #2

## 2018-10-08 MED FILL — SOLIQUA 100 UNIT-33 MCG/ML: 100-33 | 25 days supply | Qty: 15 | Fill #3

## 2018-10-20 MED FILL — KOMBIGLYZE XR 2.5-1,000 MG: 2.5-1000 | 30 days supply | Qty: 60 | Fill #2

## 2018-10-24 DIAGNOSIS — J9601 Acute respiratory failure with hypoxia: Secondary | ICD-10-CM | POA: Diagnosis not present

## 2018-11-24 DIAGNOSIS — J9601 Acute respiratory failure with hypoxia: Secondary | ICD-10-CM | POA: Diagnosis not present

## 2018-11-29 MED FILL — BUTALB-ACETAMIN-CAFF 50-325: 50-325-40 | 30 days supply | Qty: 30 | Fill #2

## 2018-11-29 MED FILL — KOMBIGLYZE XR 2.5-1,000 MG: 2.5-1000 | 30 days supply | Qty: 60 | Fill #3

## 2018-11-29 MED FILL — SOLIQUA 100 UNIT-33 MCG/ML: 100-33 | 25 days supply | Qty: 15 | Fill #4

## 2018-11-30 DIAGNOSIS — G4733 Obstructive sleep apnea (adult) (pediatric): Secondary | ICD-10-CM | POA: Diagnosis not present

## 2018-12-10 ENCOUNTER — Encounter: Payer: Self-pay | Admitting: Gastroenterology

## 2018-12-10 ENCOUNTER — Other Ambulatory Visit: Payer: Self-pay

## 2018-12-10 ENCOUNTER — Ambulatory Visit: Payer: 59 | Admitting: Gastroenterology

## 2018-12-10 VITALS — BP 140/94 | HR 75 | Temp 97.1°F | Ht 69.0 in | Wt 289.2 lb

## 2018-12-10 DIAGNOSIS — K219 Gastro-esophageal reflux disease without esophagitis: Secondary | ICD-10-CM

## 2018-12-10 DIAGNOSIS — K7581 Nonalcoholic steatohepatitis (NASH): Secondary | ICD-10-CM | POA: Diagnosis not present

## 2018-12-10 MED ORDER — FAMOTIDINE 40 MG PO TABS: 40.0000 mg | ORAL_TABLET | Freq: Every day | ORAL | 3 refills | Status: DC

## 2018-12-10 MED FILL — FAMOTIDINE 40 MG TABLET: 40 | 90 days supply | Qty: 90 | Fill #0

## 2018-12-10 NOTE — Progress Notes (Signed)
Primary Care Physician: [name], MD  Primary Gastroenterologist:  Garfield Cornea, MD   Chief Complaint  Patient presents with  . Gastroesophageal Reflux    worse    HPI: Cheryl Black is a 33 y.o. female here for follow-up of GERD and fatty liver.  Transaminitis likely due to steatohepatitis.  Chronic fleeting and positional right upper quadrant pain likely musculoskeletal versus postcholecystectomy syndrome. EGD in September with mild erosive reflux esophagitis.  Esophagus dilated for history of dysphagia. Pantoprazole increased to 40 mg twice daily.  Feels like reflux is worse since EGD/ED. Usually worse in days and evenings. May take milk at nighttime with relief. Twice a week using OTC antacids.  Can tell if she misses a dose of pantoprazole, reflux really flares up then.  For the most part she likes the way pantoprazole is working.  Breakthrough symptoms tend to be more related to food triggers.  Previously failed omeprazole.  Insurance issues with Dexilant in the past.  Denies any abdominal pain.  Bowel movements are regular.  No blood in the stool or melena.  Cholesterol has been really high since stopping statin years ago.  Recently PCP restarted approximately October.  Will go back soon for repeat LFTs.  Current Outpatient Medications  Medication Sig Dispense Refill  . acetaminophen (TYLENOL) 500 MG tablet Take 1,000 mg by mouth every 6 (six) hours as needed (for pain/headaches.).     Marland Kitchen albuterol (PROVENTIL HFA;VENTOLIN HFA) 108 (90 Base) MCG/ACT inhaler Inhale 2 puffs into the lungs every 6 (six) hours as needed for wheezing or shortness of breath. 1 Inhaler 2  . albuterol (PROVENTIL) (5 MG/ML) 0.5% nebulizer solution Take 0.5 mLs (2.5 mg total) by nebulization every 6 (six) hours as needed for wheezing or shortness of breath. 20 mL 0  . allopurinol (ZYLOPRIM) 300 MG tablet Take 300 mg by mouth daily.     . butalbital-acetaminophen-caffeine (FIORICET, ESGIC) 50-325-40  MG tablet Take 1 tablet by mouth daily as needed (headaches.).     Marland Kitchen calcium carbonate (TUMS CHEWY BITES) 750 MG chewable tablet Chew 1 tablet by mouth as needed for heartburn.    . fexofenadine (ALLEGRA) 180 MG tablet Take 180 mg by mouth daily.     . fluticasone (FLONASE) 50 MCG/ACT nasal spray Place 2 sprays into both nostrils daily.     Marland Kitchen HUMALOG KWIKPEN 100 UNIT/ML KwikPen Inject 10-22 Units into the skin 4 (four) times daily. Sliding Scale Insulin    . hydrochlorothiazide (MICROZIDE) 12.5 MG capsule Take 1 capsule (12.5 mg total) by mouth daily. 30 capsule 3  . Insulin Glargine-Lixisenatide (SOLIQUA) 100-33 UNT-MCG/ML SOPN Inject 60 Units into the skin at bedtime.     Marland Kitchen ipratropium (ATROVENT) 0.02 % nebulizer solution Take 0.5 mg by nebulization every 4 (four) hours as needed for wheezing or shortness of breath.    Marland Kitchen KOMBIGLYZE XR 2.05-998 MG TB24 Take 1 tablet by mouth 2 (two) times daily.   11  . pantoprazole (PROTONIX) 40 MG tablet Take 40 mg by mouth 2 (two) times daily.    . pravastatin (PRAVACHOL) 20 MG tablet Take 1 tablet by mouth daily.     No current facility-administered medications for this visit.     Allergies as of 12/10/2018 - Review Complete 12/10/2018  Allergen Reaction Noted  . Ace inhibitors Anaphylaxis 03/09/2016  . Beta adrenergic blockers Other (See Comments) 10/21/2016  . Augmentin [amoxicillin-pot clavulanate] Other (See Comments) 03/03/2016  . Benicar [olmesartan] Other (See Comments)  06/08/2012  . Doxycycline Other (See Comments) 06/08/2012  . Eggs or egg-derived products  09/16/2017  . Influenza vaccine live Swelling 10/13/2010  . Invokana [canagliflozin] Other (See Comments) 03/03/2016    ROS:  General: Negative for anorexia, weight loss, fever, chills, fatigue, weakness. ENT: Negative for hoarseness, difficulty swallowing , nasal congestion. CV: Negative for chest pain, angina, palpitations, dyspnea on exertion, peripheral edema.  Respiratory:  Negative for dyspnea at rest, dyspnea on exertion, cough, sputum, wheezing.  GI: See history of present illness. GU:  Negative for dysuria, hematuria, urinary incontinence, urinary frequency, nocturnal urination.  Endo: Negative for unusual weight change.    Physical Examination:   BP (!) 140/94   Pulse 75   Temp (!) 97.1 F (36.2 C) (Temporal)   Ht 5\' 9"  (1.753 m)   Wt 289 lb 3.2 oz (131.2 kg)   LMP 11/14/2018 (Approximate)   BMI 42.71 kg/m   General: Well-nourished, well-developed in no acute distress.  Eyes: No icterus. Mouth: Masked.  Abdomen: Bowel sounds are normal, nontender, nondistended, no hepatosplenomegaly or masses, no abdominal bruits or hernia , no rebound or guarding.   Extremities: No lower extremity edema. No clubbing or deformities. Neuro: Alert and oriented x 4   Skin: Warm and dry, no jaundice.   Psych: Alert and cooperative, normal mood and affect.  Labs:  Lab Results  Component Value Date   CREATININE 0.56 09/07/2018   BUN 10 09/07/2018   NA 135 09/07/2018   K 3.4 (L) 09/07/2018   CL 103 09/07/2018   CO2 23 09/07/2018   Lab Results  Component Value Date   ALT 88 (H) 09/07/2018   AST 49 (H) 09/07/2018   ALKPHOS 92 09/07/2018   BILITOT 0.3 09/07/2018   Lab Results  Component Value Date   WBC 12.7 (H) 09/07/2018   HGB 14.8 09/07/2018   HCT 45.6 09/07/2018   MCV 90.7 09/07/2018   PLT 303 09/07/2018    Imaging Studies: No results found.

## 2018-12-10 NOTE — Patient Instructions (Addendum)
1. Continue pantoprazole 56m twice daily before breakfast and evening meal. 2. Add pepcid once daily at lunch or bedtime whichever is most effective for breakthrough symptoms.  3. Continue to monitor your liver labs since starting back on a statin.  4. Return to the office in six months. Call sooner if needed.

## 2018-12-12 NOTE — Assessment & Plan Note (Signed)
Some breakthrough symptoms as outlined. Taking pantoprazole BID along with over the counter antacids. Reinforced anti-reflux triggers. Will had pepcid 40mg  daily either at lunch or bedtime whichever seems to give best results for breakthrough symptoms. Once symptoms improve, she whould try to use Pepcid prn only. Return to the office in six months.

## 2018-12-12 NOTE — Assessment & Plan Note (Addendum)
Recently restarted statin. Encouraged her to continue to follow with PCP as planned for follow up LFTs.   Instructions for fatty liver: Recommend 1-2# weight loss per week until ideal body weight through exercise & diet. Low fat/cholesterol diet.   Avoid sweets, sodas, fruit juices, sweetened beverages like tea, etc. Gradually increase exercise from 15 min daily up to 1 hr per day 5 days/week. Limit alcohol use.

## 2018-12-24 DIAGNOSIS — J9601 Acute respiratory failure with hypoxia: Secondary | ICD-10-CM | POA: Diagnosis not present

## 2019-01-03 MED FILL — FREESTYLE LITE TEST STRIP: 25 days supply | Qty: 100 | Fill #1

## 2019-01-03 MED FILL — SOLIQUA 100 UNIT-33 MCG/ML: 100-33 | 25 days supply | Qty: 15 | Fill #5

## 2019-01-03 MED FILL — PRAVASTATIN SODIUM 20 MG TA: 20 | 90 days supply | Qty: 90 | Fill #1

## 2019-01-03 MED FILL — KOMBIGLYZE XR 2.5-1,000 MG: 2.5-1000 | 30 days supply | Qty: 60 | Fill #4

## 2019-01-04 MED FILL — PANTOPRAZOLE SOD DR 40 MG T: 40 | 90 days supply | Qty: 180 | Fill #0

## 2019-01-04 MED FILL — UNIFINE PENTIPS 31GX3/16": 31G X 5 MM | 20 days supply | Qty: 100 | Fill #0

## 2019-01-04 MED FILL — UNIFINE PENTIPS 31GX3/16: 31G X 5 MM | 20 days supply | Qty: 100 | Fill #0

## 2019-01-04 MED FILL — HYDROCHLOROTHIAZIDE 12.5 MG: 12.5 | 90 days supply | Qty: 90 | Fill #0

## 2019-01-04 MED FILL — ALLOPURINOL 300 MG TABS: 300 | 90 days supply | Qty: 90 | Fill #0

## 2019-01-17 DIAGNOSIS — I10 Essential (primary) hypertension: Secondary | ICD-10-CM | POA: Diagnosis not present

## 2019-01-17 DIAGNOSIS — E119 Type 2 diabetes mellitus without complications: Secondary | ICD-10-CM | POA: Diagnosis not present

## 2019-01-17 DIAGNOSIS — E782 Mixed hyperlipidemia: Secondary | ICD-10-CM | POA: Diagnosis not present

## 2019-01-17 DIAGNOSIS — E1165 Type 2 diabetes mellitus with hyperglycemia: Secondary | ICD-10-CM | POA: Diagnosis not present

## 2019-01-21 DIAGNOSIS — E1165 Type 2 diabetes mellitus with hyperglycemia: Secondary | ICD-10-CM | POA: Diagnosis not present

## 2019-01-21 DIAGNOSIS — E782 Mixed hyperlipidemia: Secondary | ICD-10-CM | POA: Diagnosis not present

## 2019-01-21 DIAGNOSIS — F17200 Nicotine dependence, unspecified, uncomplicated: Secondary | ICD-10-CM | POA: Diagnosis not present

## 2019-01-21 DIAGNOSIS — Z23 Encounter for immunization: Secondary | ICD-10-CM | POA: Diagnosis not present

## 2019-01-21 DIAGNOSIS — R945 Abnormal results of liver function studies: Secondary | ICD-10-CM | POA: Diagnosis not present

## 2019-01-21 DIAGNOSIS — J4540 Moderate persistent asthma, uncomplicated: Secondary | ICD-10-CM | POA: Diagnosis not present

## 2019-01-21 DIAGNOSIS — M1000 Idiopathic gout, unspecified site: Secondary | ICD-10-CM | POA: Diagnosis not present

## 2019-01-21 DIAGNOSIS — K219 Gastro-esophageal reflux disease without esophagitis: Secondary | ICD-10-CM | POA: Diagnosis not present

## 2019-01-24 DIAGNOSIS — J9601 Acute respiratory failure with hypoxia: Secondary | ICD-10-CM | POA: Diagnosis not present

## 2019-02-07 DIAGNOSIS — G4733 Obstructive sleep apnea (adult) (pediatric): Secondary | ICD-10-CM | POA: Diagnosis not present

## 2019-02-10 DIAGNOSIS — Z7189 Other specified counseling: Secondary | ICD-10-CM | POA: Diagnosis not present

## 2019-02-10 DIAGNOSIS — Z6841 Body Mass Index (BMI) 40.0 and over, adult: Secondary | ICD-10-CM | POA: Diagnosis not present

## 2019-02-10 DIAGNOSIS — Z794 Long term (current) use of insulin: Secondary | ICD-10-CM | POA: Diagnosis not present

## 2019-02-10 DIAGNOSIS — I10 Essential (primary) hypertension: Secondary | ICD-10-CM | POA: Diagnosis not present

## 2019-02-10 DIAGNOSIS — E785 Hyperlipidemia, unspecified: Secondary | ICD-10-CM | POA: Diagnosis not present

## 2019-02-10 DIAGNOSIS — K7581 Nonalcoholic steatohepatitis (NASH): Secondary | ICD-10-CM | POA: Diagnosis not present

## 2019-02-10 DIAGNOSIS — E1165 Type 2 diabetes mellitus with hyperglycemia: Secondary | ICD-10-CM | POA: Diagnosis not present

## 2019-02-21 MED FILL — metFORMIN HCL ER 500 MG TB2: 500 | 30 days supply | Qty: 120 | Fill #0

## 2019-02-23 MED FILL — FREESTYLE LIBRE 2 SENSOR SY: 28 days supply | Qty: 2 | Fill #0

## 2019-02-24 DIAGNOSIS — J9601 Acute respiratory failure with hypoxia: Secondary | ICD-10-CM | POA: Diagnosis not present

## 2019-03-03 DIAGNOSIS — E1165 Type 2 diabetes mellitus with hyperglycemia: Secondary | ICD-10-CM | POA: Diagnosis not present

## 2019-03-03 DIAGNOSIS — Z794 Long term (current) use of insulin: Secondary | ICD-10-CM | POA: Diagnosis not present

## 2019-03-03 DIAGNOSIS — Z6841 Body Mass Index (BMI) 40.0 and over, adult: Secondary | ICD-10-CM | POA: Diagnosis not present

## 2019-03-03 DIAGNOSIS — Z7189 Other specified counseling: Secondary | ICD-10-CM | POA: Diagnosis not present

## 2019-03-03 DIAGNOSIS — K7581 Nonalcoholic steatohepatitis (NASH): Secondary | ICD-10-CM | POA: Diagnosis not present

## 2019-03-03 DIAGNOSIS — I10 Essential (primary) hypertension: Secondary | ICD-10-CM | POA: Diagnosis not present

## 2019-03-03 DIAGNOSIS — E785 Hyperlipidemia, unspecified: Secondary | ICD-10-CM | POA: Diagnosis not present

## 2019-03-15 ENCOUNTER — Other Ambulatory Visit (HOSPITAL_COMMUNITY): Payer: Self-pay | Admitting: Internal Medicine

## 2019-03-15 DIAGNOSIS — R0902 Hypoxemia: Secondary | ICD-10-CM | POA: Diagnosis not present

## 2019-03-15 MED FILL — FARXIGA 5 MG TABLET: 5 | 30 days supply | Qty: 30 | Fill #0

## 2019-03-24 MED FILL — DEXCOM G6 SENSOR MISC: 30 days supply | Qty: 3 | Fill #0

## 2019-04-22 ENCOUNTER — Other Ambulatory Visit: Payer: Self-pay

## 2019-04-22 ENCOUNTER — Ambulatory Visit: Payer: 59 | Admitting: Obstetrics & Gynecology

## 2019-04-22 ENCOUNTER — Encounter: Payer: Self-pay | Admitting: Obstetrics & Gynecology

## 2019-04-22 VITALS — BP 108/69 | HR 71 | Ht 69.0 in | Wt 282.0 lb

## 2019-04-22 DIAGNOSIS — N938 Other specified abnormal uterine and vaginal bleeding: Secondary | ICD-10-CM | POA: Diagnosis not present

## 2019-04-22 DIAGNOSIS — E282 Polycystic ovarian syndrome: Secondary | ICD-10-CM

## 2019-04-22 MED ORDER — MEGESTROL ACETATE 40 MG PO TABS: ORAL_TABLET | ORAL | 1 refills | Status: DC

## 2019-04-22 NOTE — Progress Notes (Signed)
Chief Complaint  Patient presents with  . Vaginal Bleeding    x 2 weeks started 4-3 and still on      34 y.o. G0P0000 Patient's last menstrual period was 04/09/2019. The current method of family planning is none.  Outpatient Encounter Medications as of 04/22/2019  Medication Sig  . allopurinol (ZYLOPRIM) 300 MG tablet Take 300 mg by mouth daily.   . dapagliflozin propanediol (FARXIGA) 5 MG TABS tablet Take 5 mg by mouth daily.  . famotidine (PEPCID) 40 MG tablet Take 1 tablet (40 mg total) by mouth daily.  . fexofenadine (ALLEGRA) 180 MG tablet Take 180 mg by mouth daily.   . fluticasone (FLONASE) 50 MCG/ACT nasal spray Place 2 sprays into both nostrils daily.   Marland Kitchen HUMALOG KWIKPEN 100 UNIT/ML KwikPen Inject 10-22 Units into the skin 4 (four) times daily. Sliding Scale Insulin  . metFORMIN (GLUCOPHAGE) 500 MG tablet Take by mouth 2 (two) times daily with a meal.  . pravastatin (PRAVACHOL) 20 MG tablet Take 1 tablet by mouth daily.  Marland Kitchen acetaminophen (TYLENOL) 500 MG tablet Take 1,000 mg by mouth every 6 (six) hours as needed (for pain/headaches.).   Marland Kitchen albuterol (PROVENTIL HFA;VENTOLIN HFA) 108 (90 Base) MCG/ACT inhaler Inhale 2 puffs into the lungs every 6 (six) hours as needed for wheezing or shortness of breath. (Patient not taking: Reported on 04/22/2019)  . albuterol (PROVENTIL) (5 MG/ML) 0.5% nebulizer solution Take 0.5 mLs (2.5 mg total) by nebulization every 6 (six) hours as needed for wheezing or shortness of breath. (Patient not taking: Reported on 04/22/2019)  . butalbital-acetaminophen-caffeine (FIORICET, ESGIC) 50-325-40 MG tablet Take 1 tablet by mouth daily as needed (headaches.).   Marland Kitchen calcium carbonate (TUMS CHEWY BITES) 750 MG chewable tablet Chew 1 tablet by mouth as needed for heartburn.  . hydrochlorothiazide (MICROZIDE) 12.5 MG capsule Take 1 capsule (12.5 mg total) by mouth daily. (Patient not taking: Reported on 04/22/2019)  . Insulin Glargine-Lixisenatide (SOLIQUA)  100-33 UNT-MCG/ML SOPN Inject 60 Units into the skin at bedtime.   Marland Kitchen ipratropium (ATROVENT) 0.02 % nebulizer solution Take 0.5 mg by nebulization every 4 (four) hours as needed for wheezing or shortness of breath.  Marland Kitchen KOMBIGLYZE XR 2.05-998 MG TB24 Take 1 tablet by mouth 2 (two) times daily.   . megestrol (MEGACE) 40 MG tablet 3 tablets a day for 5 days, 2 tablets a day for 5 days then 1 tablet daily  . pantoprazole (PROTONIX) 40 MG tablet Take 40 mg by mouth 2 (two) times daily.   No facility-administered encounter medications on file as of 04/22/2019.    Subjective No period since end of December then began bleeding 04/09/19 Bleeding volume has been variable Bleeding color has been variable Some tenderness but not painful as it has been before Chronically anovulatory or oligo ovulatory/PCOS type clinical picture Past Medical History:  Diagnosis Date  . Acid reflux   . Asthma   . Clostridium difficile infection    in the past  . Diabetes mellitus   . Fatty liver   . Fibrocystic breast disease   . Gout   . Hidradenitis   . History of kidney stones   . Hyperlipemia   . Obesity   . Polycystic ovary syndrome   . PONV (postoperative nausea and vomiting)   . Sleep apnea   . Smoker     Past Surgical History:  Procedure Laterality Date  . CHOLECYSTECTOMY  01/10/2011   Procedure: LAPAROSCOPIC CHOLECYSTECTOMY;  Surgeon: Jamesetta So;  Location: [facility];  Service: General;  Laterality: N/A;  . ESOPHAGOGASTRODUODENOSCOPY (EGD) WITH PROPOFOL N/A 09/09/2018   RMR: Mild erosive reflux esophagitis.  Esophagus dilated for history of dysphagia.  Marland Kitchen HYDRADENITIS EXCISION Right 06/18/2012   Procedure: EXCISION HYDRIADENITIS SUPRATIVA  RIGHT AXILLA;  Surgeon: Scherry Ran, MD;  Location: [facility];  Service: General;  Laterality: Right;  . LIVER BIOPSY  01/10/2011   Procedure: LIVER BIOPSY;  Surgeon: Jamesetta So;  Location: [facility];  Service: General;;  . Venia Minks DILATION N/A 09/09/2018    Procedure: Venia Minks DILATION;  Surgeon: Daneil Dolin, MD;  Location: [facility];  Service: Endoscopy;  Laterality: N/A;  . SKIN SPLIT GRAFT Right 06/18/2012   Procedure: SKIN GRAFT SPLIT THICKNESS RIGHT AXILLA(WITH TWO STANDARD SKIN BOARDS 3"x 8" )  DONOR SITE RIGHT AND LEFT THIGHS;  Surgeon: Scherry Ran, MD;  Location: [facility];  Service: General;  Laterality: Right;    OB History    Gravida  0   Para  0   Term  0   Preterm  0   AB  0   Living  0     SAB  0   TAB  0   Ectopic  0   Multiple  0   Live Births  0           Allergies  Allergen Reactions  . Ace Inhibitors Anaphylaxis  . Beta Adrenergic Blockers Other (See Comments)    Angioedema   . Augmentin [Amoxicillin-Pot Clavulanate] Other (See Comments)    Has had C.diff in the past Did it involve swelling of the face/tongue/throat, SOB, or low BP? No Did it involve sudden or severe rash/hives, skin peeling, or any reaction on the inside of your mouth or nose? No Did you need to seek medical attention at a hospital or doctor's office? No When did it last happen?Unknown If all above answers are "NO", may proceed with cephalosporin use.   Marland Kitchen Benicar [Olmesartan] Other (See Comments)    Facial edema  . Doxycycline Other (See Comments)    Makes feet and hands red  . Eggs Or Egg-Derived Products     Local reaction with injectable meds that contain eggs  . Influenza Vaccine Live Swelling    Arm   . Invokana [Canagliflozin] Other (See Comments)    Dehydration     Social History   Socioeconomic History  . Marital status: Married    Spouse name: Not on file  . Number of children: 0  . Years of education: Not on file  . Highest education level: Not on file  Occupational History  . Occupation: nurse  Tobacco Use  . Smoking status: Former Smoker    Packs/day: 1.00    Years: 10.00    Pack years: 10.00    Types: Cigarettes    Quit date: 08/23/2017    Years since quitting: 1.6  . Smokeless  tobacco: Never Used  Substance and Sexual Activity  . Alcohol use: Yes    Comment: occas  . Drug use: No  . Sexual activity: Yes    Birth control/protection: None  Other Topics Concern  . Not on file  Social History Narrative   Is a nurse here at [facility]   Social Determinants of Health   Financial Resource Strain:   . Difficulty of Paying Living Expenses:   Food Insecurity:   . Worried About Charity fundraiser in the Last Year:   . [employer] of  Food in the Last Year:   Transportation Needs:   . Film/video editor (Medical):   Marland Kitchen Lack of Transportation (Non-Medical):   Physical Activity:   . Days of Exercise per Week:   . Minutes of Exercise per Session:   Stress:   . Feeling of Stress :   Social Connections:   . Frequency of Communication with Friends and Family:   . Frequency of Social Gatherings with Friends and Family:   . Attends Religious Services:   . Active Member of Clubs or Organizations:   . Attends Archivist Meetings:   Marland Kitchen Marital Status:     Family History  Problem Relation Age of Onset  . Stroke Other        paternal great grandfather  . Diabetes Father   . Hypertension Father   . Cancer Maternal Grandfather        adenocarcinoma  . Emphysema Maternal Grandfather        smoked  . Heart attack Paternal Grandfather   . Hypertension Paternal Grandmother   . Diabetes Paternal Grandmother   . Gout Paternal Grandmother   . Hypertension Maternal Grandmother   . Other Mother        pre-cancerous cells; had hyst  . Emphysema Mother        smoker  . Gout Brother   . Cancer Sister        cervical; had hyst  . Asthma Maternal Uncle   . Colon cancer Neg Hx   . Liver disease Neg Hx   . Liver cancer Neg Hx     Medications:       Current Outpatient Medications:  .  allopurinol (ZYLOPRIM) 300 MG tablet, Take 300 mg by mouth daily. , Disp: , Rfl:  .  dapagliflozin propanediol (FARXIGA) 5 MG TABS tablet, Take 5 mg by mouth daily., Disp: , Rfl:    .  famotidine (PEPCID) 40 MG tablet, Take 1 tablet (40 mg total) by mouth daily., Disp: 90 tablet, Rfl: 3 .  fexofenadine (ALLEGRA) 180 MG tablet, Take 180 mg by mouth daily. , Disp: , Rfl:  .  fluticasone (FLONASE) 50 MCG/ACT nasal spray, Place 2 sprays into both nostrils daily. , Disp: , Rfl:  .  HUMALOG KWIKPEN 100 UNIT/ML KwikPen, Inject 10-22 Units into the skin 4 (four) times daily. Sliding Scale Insulin, Disp: , Rfl:  .  metFORMIN (GLUCOPHAGE) 500 MG tablet, Take by mouth 2 (two) times daily with a meal., Disp: , Rfl:  .  pravastatin (PRAVACHOL) 20 MG tablet, Take 1 tablet by mouth daily., Disp: , Rfl:  .  acetaminophen (TYLENOL) 500 MG tablet, Take 1,000 mg by mouth every 6 (six) hours as needed (for pain/headaches.). , Disp: , Rfl:  .  albuterol (PROVENTIL HFA;VENTOLIN HFA) 108 (90 Base) MCG/ACT inhaler, Inhale 2 puffs into the lungs every 6 (six) hours as needed for wheezing or shortness of breath. (Patient not taking: Reported on 04/22/2019), Disp: 1 Inhaler, Rfl: 2 .  albuterol (PROVENTIL) (5 MG/ML) 0.5% nebulizer solution, Take 0.5 mLs (2.5 mg total) by nebulization every 6 (six) hours as needed for wheezing or shortness of breath. (Patient not taking: Reported on 04/22/2019), Disp: 20 mL, Rfl: 0 .  butalbital-acetaminophen-caffeine (FIORICET, ESGIC) 50-325-40 MG tablet, Take 1 tablet by mouth daily as needed (headaches.). , Disp: , Rfl:  .  calcium carbonate (TUMS CHEWY BITES) 750 MG chewable tablet, Chew 1 tablet by mouth as needed for heartburn., Disp: , Rfl:  .  hydrochlorothiazide (  MICROZIDE) 12.5 MG capsule, Take 1 capsule (12.5 mg total) by mouth daily. (Patient not taking: Reported on 04/22/2019), Disp: 30 capsule, Rfl: 3 .  Insulin Glargine-Lixisenatide (SOLIQUA) 100-33 UNT-MCG/ML SOPN, Inject 60 Units into the skin at bedtime. , Disp: , Rfl:  .  ipratropium (ATROVENT) 0.02 % nebulizer solution, Take 0.5 mg by nebulization every 4 (four) hours as needed for wheezing or shortness of  breath., Disp: , Rfl:  .  KOMBIGLYZE XR 2.05-998 MG TB24, Take 1 tablet by mouth 2 (two) times daily. , Disp: , Rfl: 11 .  megestrol (MEGACE) 40 MG tablet, 3 tablets a day for 5 days, 2 tablets a day for 5 days then 1 tablet daily, Disp: 45 tablet, Rfl: 1 .  pantoprazole (PROTONIX) 40 MG tablet, Take 40 mg by mouth 2 (two) times daily., Disp: , Rfl:   Objective Blood pressure 108/69, pulse 71, height 5' 9"  (1.753 m), weight 282 lb (127.9 kg), last menstrual period 04/09/2019.  Gen WDWN NAD  Pertinent ROS No burning with urination, frequency or urgency No nausea, vomiting or diarrhea Nor fever chills or other constitutional symptoms   Labs or studies Hemoglobin 15.1    Impression Diagnoses this Encounter::   ICD-10-CM   1. DUB (dysfunctional uterine bleeding)  N93.8    megestrol algorithm to synchronize and then withdraw the endometrium  2. PCOS (polycystic ovarian syndrome)  E28.2     Established relevant diagnosis(es):   Plan/Recommendations: Meds ordered this encounter  Medications  . megestrol (MEGACE) 40 MG tablet    Sig: 3 tablets a day for 5 days, 2 tablets a day for 5 days then 1 tablet daily    Dispense:  45 tablet    Refill:  1    Labs or Scans Ordered: No orders of the defined types were placed in this encounter.   Management:: As above  Follow up Return if symptoms worsen or fail to improve.        Face to face time:  15 minutes  Greater than 50% of the visit time was spent in counseling and coordination of care with the patient.  The summary and outline of the counseling and care coordination is summarized in the note above.   All questions were answered.

## 2019-04-28 DIAGNOSIS — E1165 Type 2 diabetes mellitus with hyperglycemia: Secondary | ICD-10-CM | POA: Diagnosis not present

## 2019-04-28 DIAGNOSIS — Z6841 Body Mass Index (BMI) 40.0 and over, adult: Secondary | ICD-10-CM | POA: Diagnosis not present

## 2019-04-28 DIAGNOSIS — E785 Hyperlipidemia, unspecified: Secondary | ICD-10-CM | POA: Diagnosis not present

## 2019-04-28 DIAGNOSIS — K7581 Nonalcoholic steatohepatitis (NASH): Secondary | ICD-10-CM | POA: Diagnosis not present

## 2019-04-28 DIAGNOSIS — E162 Hypoglycemia, unspecified: Secondary | ICD-10-CM | POA: Diagnosis not present

## 2019-04-28 DIAGNOSIS — Z7189 Other specified counseling: Secondary | ICD-10-CM | POA: Diagnosis not present

## 2019-04-28 DIAGNOSIS — E119 Type 2 diabetes mellitus without complications: Secondary | ICD-10-CM | POA: Diagnosis not present

## 2019-04-28 DIAGNOSIS — I10 Essential (primary) hypertension: Secondary | ICD-10-CM | POA: Diagnosis not present

## 2019-04-28 DIAGNOSIS — Z794 Long term (current) use of insulin: Secondary | ICD-10-CM | POA: Diagnosis not present

## 2019-04-30 MED FILL — FARXIGA 5 MG TABLET: 5 | 30 days supply | Qty: 30 | Fill #1

## 2019-04-30 MED FILL — metFORMIN HCL ER 500 MG TB2: 500 | 30 days supply | Qty: 120 | Fill #1

## 2019-04-30 MED FILL — DEXCOM G6 SENSOR MISC: 30 days supply | Qty: 3 | Fill #1

## 2019-05-02 MED FILL — HYDROCHLOROTHIAZIDE 12.5 MG: 12.5 | 90 days supply | Qty: 90 | Fill #0

## 2019-05-02 MED FILL — ALLOPURINOL 300 MG TABS: 300 | 90 days supply | Qty: 90 | Fill #0

## 2019-05-02 MED FILL — PANTOPRAZOLE SOD DR 40 MG T: 40 | 90 days supply | Qty: 180 | Fill #0

## 2019-05-09 ENCOUNTER — Other Ambulatory Visit (HOSPITAL_COMMUNITY): Payer: Self-pay | Admitting: Internal Medicine

## 2019-05-10 MED FILL — OZEMPIC 0.25 OR 0.5 MG/DOSE: 2 | 42 days supply | Qty: 2 | Fill #0

## 2019-05-24 ENCOUNTER — Other Ambulatory Visit: Payer: Self-pay | Admitting: Obstetrics & Gynecology

## 2019-05-24 MED ORDER — MEDROXYPROGESTERONE ACETATE 10 MG PO TABS: 10.0000 mg | ORAL_TABLET | Freq: Every day | ORAL | 11 refills | Status: DC

## 2019-05-27 DIAGNOSIS — G4733 Obstructive sleep apnea (adult) (pediatric): Secondary | ICD-10-CM | POA: Diagnosis not present

## 2019-05-30 DIAGNOSIS — E785 Hyperlipidemia, unspecified: Secondary | ICD-10-CM | POA: Diagnosis not present

## 2019-05-30 DIAGNOSIS — E119 Type 2 diabetes mellitus without complications: Secondary | ICD-10-CM | POA: Diagnosis not present

## 2019-05-30 DIAGNOSIS — I10 Essential (primary) hypertension: Secondary | ICD-10-CM | POA: Diagnosis not present

## 2019-05-30 DIAGNOSIS — K7581 Nonalcoholic steatohepatitis (NASH): Secondary | ICD-10-CM | POA: Diagnosis not present

## 2019-05-30 DIAGNOSIS — Z6841 Body Mass Index (BMI) 40.0 and over, adult: Secondary | ICD-10-CM | POA: Diagnosis not present

## 2019-05-30 DIAGNOSIS — Z7189 Other specified counseling: Secondary | ICD-10-CM | POA: Diagnosis not present

## 2019-05-30 DIAGNOSIS — E1165 Type 2 diabetes mellitus with hyperglycemia: Secondary | ICD-10-CM | POA: Diagnosis not present

## 2019-05-30 MED FILL — FARXIGA 10 MG TABLET: 10 | 30 days supply | Qty: 30 | Fill #0

## 2019-05-30 MED FILL — DEXCOM G6 SENSOR MISC: 30 days supply | Qty: 3 | Fill #2

## 2019-05-30 MED FILL — DEXCOM G6 TRANSMITTER MISC: 90 days supply | Qty: 1 | Fill #0

## 2019-06-06 ENCOUNTER — Inpatient Hospital Stay (HOSPITAL_COMMUNITY): Payer: 59

## 2019-06-06 ENCOUNTER — Encounter (HOSPITAL_COMMUNITY): Payer: Self-pay | Admitting: Emergency Medicine

## 2019-06-06 ENCOUNTER — Other Ambulatory Visit: Payer: Self-pay

## 2019-06-06 ENCOUNTER — Inpatient Hospital Stay (HOSPITAL_COMMUNITY)
Admission: AD | Admit: 2019-06-06 | Discharge: 2019-06-06 | Disposition: A | Discharge status: Home / Self Care | Payer: 59 | Attending: Emergency Medicine | Admitting: Emergency Medicine

## 2019-06-06 DIAGNOSIS — Z881 Allergy status to other antibiotic agents status: Secondary | ICD-10-CM | POA: Diagnosis not present

## 2019-06-06 DIAGNOSIS — R9389 Abnormal findings on diagnostic imaging of other specified body structures: Secondary | ICD-10-CM | POA: Diagnosis not present

## 2019-06-06 DIAGNOSIS — K219 Gastro-esophageal reflux disease without esophagitis: Secondary | ICD-10-CM | POA: Insufficient documentation

## 2019-06-06 DIAGNOSIS — Z794 Long term (current) use of insulin: Secondary | ICD-10-CM | POA: Diagnosis not present

## 2019-06-06 DIAGNOSIS — R102 Pelvic and perineal pain: Secondary | ICD-10-CM | POA: Diagnosis not present

## 2019-06-06 DIAGNOSIS — Z79899 Other long term (current) drug therapy: Secondary | ICD-10-CM | POA: Diagnosis not present

## 2019-06-06 DIAGNOSIS — Z87891 Personal history of nicotine dependence: Secondary | ICD-10-CM | POA: Diagnosis not present

## 2019-06-06 DIAGNOSIS — Z880 Allergy status to penicillin: Secondary | ICD-10-CM | POA: Insufficient documentation

## 2019-06-06 DIAGNOSIS — M109 Gout, unspecified: Secondary | ICD-10-CM | POA: Diagnosis not present

## 2019-06-06 DIAGNOSIS — Z8249 Family history of ischemic heart disease and other diseases of the circulatory system: Secondary | ICD-10-CM | POA: Diagnosis not present

## 2019-06-06 DIAGNOSIS — Z833 Family history of diabetes mellitus: Secondary | ICD-10-CM | POA: Diagnosis not present

## 2019-06-06 DIAGNOSIS — Z6841 Body Mass Index (BMI) 40.0 and over, adult: Secondary | ICD-10-CM | POA: Insufficient documentation

## 2019-06-06 DIAGNOSIS — Z825 Family history of asthma and other chronic lower respiratory diseases: Secondary | ICD-10-CM | POA: Insufficient documentation

## 2019-06-06 DIAGNOSIS — Z9049 Acquired absence of other specified parts of digestive tract: Secondary | ICD-10-CM | POA: Insufficient documentation

## 2019-06-06 DIAGNOSIS — Z887 Allergy status to serum and vaccine status: Secondary | ICD-10-CM | POA: Insufficient documentation

## 2019-06-06 DIAGNOSIS — E119 Type 2 diabetes mellitus without complications: Secondary | ICD-10-CM | POA: Insufficient documentation

## 2019-06-06 DIAGNOSIS — Z87442 Personal history of urinary calculi: Secondary | ICD-10-CM | POA: Insufficient documentation

## 2019-06-06 DIAGNOSIS — N939 Abnormal uterine and vaginal bleeding, unspecified: Secondary | ICD-10-CM | POA: Diagnosis not present

## 2019-06-06 DIAGNOSIS — E282 Polycystic ovarian syndrome: Secondary | ICD-10-CM | POA: Insufficient documentation

## 2019-06-06 DIAGNOSIS — Z888 Allergy status to other drugs, medicaments and biological substances status: Secondary | ICD-10-CM | POA: Insufficient documentation

## 2019-06-06 DIAGNOSIS — Z91012 Allergy to eggs: Secondary | ICD-10-CM | POA: Diagnosis not present

## 2019-06-06 LAB — COMPREHENSIVE METABOLIC PANEL
ALT: 38 U/L (ref 0–44)
AST: 25 U/L (ref 15–41)
Albumin: 3.4 g/dL — ABNORMAL LOW (ref 3.5–5.0)
Alkaline Phosphatase: 70 U/L (ref 38–126)
Anion gap: 9 (ref 5–15)
BUN: 10 mg/dL (ref 6–20)
CO2: 23 mmol/L (ref 22–32)
Calcium: 8.9 mg/dL (ref 8.9–10.3)
Chloride: 106 mmol/L (ref 98–111)
Creatinine, Ser: 0.75 mg/dL (ref 0.44–1.00)
GFR calc Af Amer: >60 mL/min (ref >60)
GFR calc non Af Amer: >60 mL/min (ref >60)
Glucose, Bld: 164 mg/dL — ABNORMAL HIGH (ref 70–99)
Potassium: 3.4 mmol/L — ABNORMAL LOW (ref 3.5–5.1)
Sodium: 138 mmol/L (ref 135–145)
Total Bilirubin: 0.6 mg/dL (ref 0.3–1.2)
Total Protein: 6.4 g/dL — ABNORMAL LOW (ref 6.5–8.1)

## 2019-06-06 LAB — URINALYSIS, ROUTINE W REFLEX MICROSCOPIC
Bilirubin Urine: NEGATIVE
Glucose, UA: >=500 mg/dL — AB
Ketones, ur: NEGATIVE mg/dL
Nitrite: NEGATIVE
Protein, ur: 30 mg/dL — AB
RBC / HPF: >50 RBC/hpf — ABNORMAL HIGH (ref 0–5)
Specific Gravity, Urine: 1.039 — ABNORMAL HIGH (ref 1.005–1.030)
pH: 5 (ref 5.0–8.0)

## 2019-06-06 LAB — WET PREP, GENITAL
Clue Cells Wet Prep HPF POC: NONE SEEN
Sperm: NONE SEEN
Trich, Wet Prep: NONE SEEN
Yeast Wet Prep HPF POC: NONE SEEN

## 2019-06-06 LAB — CBC
HCT: 48.9 % — ABNORMAL HIGH (ref 36.0–46.0)
Hemoglobin: 16.3 g/dL — ABNORMAL HIGH (ref 12.0–15.0)
MCH: 29.9 pg (ref 26.0–34.0)
MCHC: 33.3 g/dL (ref 30.0–36.0)
MCV: 89.7 fL (ref 80.0–100.0)
Platelets: 263 10*3/uL (ref 150–400)
RBC: 5.45 MIL/uL — ABNORMAL HIGH (ref 3.87–5.11)
RDW: 13 % (ref 11.5–15.5)
WBC: 12 10*3/uL — ABNORMAL HIGH (ref 4.0–10.5)
nRBC: 0 % (ref 0.0–0.2)

## 2019-06-06 LAB — POCT PREGNANCY, URINE: Preg Test, Ur: NEGATIVE

## 2019-06-06 LAB — LIPASE, BLOOD: Lipase: 34 U/L (ref 11–51)

## 2019-06-06 MED ORDER — SODIUM CHLORIDE 0.9% FLUSH: 3.0000 mL | Freq: Once | INTRAVENOUS | Status: DC

## 2019-06-06 NOTE — ED Triage Notes (Signed)
Pt c/o generalized abd pain and vaginal bleed that started today at 1:30 am no nausea or vomiting.

## 2019-06-06 NOTE — ED Provider Notes (Signed)
[facility] EMERGENCY DEPARTMENT Provider Note   CSN: [id number] Arrival date & time: 06/06/19  6962     History Chief Complaint  Patient presents with  . Abdominal Pain  . Vaginal Bleeding    Cheryl Black is a 34 y.o. female.  HPI HPI Comments: Cheryl Black is a 34 y.o. female history of morbid obesity, PCOS, steatohepatitis who presents to the Emergency Department complaining of vaginal bleeding.  Up until last month patient was experiencing regular menstrual cycles.  Last month she notes that she "did not experience any vaginal bleeding" but was expelling "tissue when urinating" and noticed "blood when wiping".  She was then seen by her OB/GYN last month.  She was diagnosed with dysfunctional uterine bleeding.  She was placed on megestrol.  She completed a course of this and still had not resumed a regular cycle as of May 18.  She discussed this with her OB/GYN and elected to start taking once monthly Provera injections.  She has not started her monthly Provera injections as of yet.  2 days ago she began experiencing a normal menstrual cycle.  Yesterday she was experiencing some mild intermittent pelvic cramping.  About 6 hours ago she began experiencing more severe constant waxing and waning 6/10 pelvic cramping.  She also notes heavier vaginal bleeding than normal.  She took 800 mg of ibuprofen and applied a heating pad without significant relief.  She denies fevers, chills, chest pain, shortness of breath, nausea, vomiting, diarrhea, constipation, urinary changes, syncope.     Past Medical History:  Diagnosis Date  . Acid reflux   . Asthma   . Clostridium difficile infection    in the past  . Diabetes mellitus   . Fatty liver   . Fibrocystic breast disease   . Gout   . Hidradenitis   . History of kidney stones   . Hyperlipemia   . Obesity   . Polycystic ovary syndrome   . PONV (postoperative nausea and vomiting)   . Sleep apnea   . Smoker      Patient Active Problem List   Diagnosis Date Noted  . GERD (gastroesophageal reflux disease) 12/10/2018  . Acute respiratory failure with hypoxia ([place]) 03/18/2018  . Uncontrolled type 2 diabetes mellitus with hyperglycemia ([place]) 03/18/2018  . Respiratory distress   . Thickened endometrium 01/28/2018  . Endometrial mass 01/28/2018  . Abdominal pain 10/09/2017  . Nausea & vomiting 09/24/2017  . DOE (dyspnea on exertion) 09/16/2017  . SOB (shortness of breath) 08/25/2017  . Bronchospasm, acute 08/25/2017  . Acute respiratory failure with hypoxemia ([place]) 08/25/2017  . C. difficile enteritis 02/01/2012  . Hyponatremia 01/07/2011  . Hypokalemia 01/07/2011  . Elevated LFTs 01/07/2011  . Hypomagnesemia 01/07/2011  . Diabetes mellitus type 2 in obese ([place]) 01/06/2011  . Morbid obesity ([place]) 01/06/2011  . Polycystic ovary disease 01/06/2011  . Tobacco abuse 01/06/2011  . Steatohepatitis 06/08/2008  . RUQ pain 06/08/2008  . TRANSAMINASES, SERUM, ELEVATED 06/08/2008    Past Surgical History:  Procedure Laterality Date  . CHOLECYSTECTOMY  01/10/2011   Procedure: LAPAROSCOPIC CHOLECYSTECTOMY;  Surgeon: Jamesetta So;  Location: [facility];  Service: General;  Laterality: N/A;  . ESOPHAGOGASTRODUODENOSCOPY (EGD) WITH PROPOFOL N/A 09/09/2018   RMR: Mild erosive reflux esophagitis.  Esophagus dilated for history of dysphagia.  Marland Kitchen HYDRADENITIS EXCISION Right 06/18/2012   Procedure: EXCISION HYDRIADENITIS SUPRATIVA  RIGHT AXILLA;  Surgeon: Scherry Ran, MD;  Location: [facility];  Service: General;  Laterality:  Right;  Marland Kitchen LIVER BIOPSY  01/10/2011   Procedure: LIVER BIOPSY;  Surgeon: Jamesetta So;  Location: [facility];  Service: General;;  . Venia Minks DILATION N/A 09/09/2018   Procedure: Venia Minks DILATION;  Surgeon: Daneil Dolin, MD;  Location: [facility] SUITE;  Service: Endoscopy;  Laterality: N/A;  . SKIN SPLIT GRAFT Right 06/18/2012   Procedure: SKIN GRAFT SPLIT THICKNESS RIGHT AXILLA(WITH TWO STANDARD  SKIN BOARDS 3"x 8" )  DONOR SITE RIGHT AND LEFT THIGHS;  Surgeon: Scherry Ran, MD;  Location: [facility];  Service: General;  Laterality: Right;     OB History    Gravida  0   Para  0   Term  0   Preterm  0   AB  0   Living  0     SAB  0   TAB  0   Ectopic  0   Multiple  0   Live Births  0           Family History  Problem Relation Age of Onset  . Stroke Other        paternal great grandfather  . Diabetes Father   . Hypertension Father   . Cancer Maternal Grandfather        adenocarcinoma  . Emphysema Maternal Grandfather        smoked  . Heart attack Paternal Grandfather   . Hypertension Paternal Grandmother   . Diabetes Paternal Grandmother   . Gout Paternal Grandmother   . Hypertension Maternal Grandmother   . Other Mother        pre-cancerous cells; had hyst  . Emphysema Mother        smoker  . Gout Brother   . Cancer Sister        cervical; had hyst  . Asthma Maternal Uncle   . Colon cancer Neg Hx   . Liver disease Neg Hx   . Liver cancer Neg Hx     Social History   Tobacco Use  . Smoking status: Former Smoker    Packs/day: 1.00    Years: 10.00    Pack years: 10.00    Types: Cigarettes    Quit date: 08/23/2017    Years since quitting: 1.7  . Smokeless tobacco: Never Used  Substance Use Topics  . Alcohol use: Yes    Comment: occas  . Drug use: No    Home Medications Prior to Admission medications   Medication Sig Start Date End Date Taking? Authorizing Provider  acetaminophen (TYLENOL) 500 MG tablet Take 1,000 mg by mouth every 6 (six) hours as needed (for pain/headaches.).     [provider]  albuterol (PROVENTIL HFA;VENTOLIN HFA) 108 (90 Base) MCG/ACT inhaler Inhale 2 puffs into the lungs every 6 (six) hours as needed for wheezing or shortness of breath. Patient not taking: Reported on 04/22/2019 08/31/17   Isaac Bliss, Rayford Halsted, MD  albuterol (PROVENTIL) (5 MG/ML) 0.5% nebulizer solution Take 0.5 mLs (2.5 mg  total) by nebulization every 6 (six) hours as needed for wheezing or shortness of breath. Patient not taking: Reported on 04/22/2019 08/23/17   Jacqlyn Larsen, PA-C  allopurinol (ZYLOPRIM) 300 MG tablet Take 300 mg by mouth daily.     [provider]  butalbital-acetaminophen-caffeine (FIORICET, ESGIC) 50-325-40 MG tablet Take 1 tablet by mouth daily as needed (headaches.).  01/21/18   [provider]  calcium carbonate (TUMS CHEWY BITES) 750 MG chewable tablet Chew 1 tablet by mouth as needed for  heartburn.    [provider]  dapagliflozin propanediol (FARXIGA) 5 MG TABS tablet Take 5 mg by mouth daily.    [provider]  famotidine (PEPCID) 40 MG tablet Take 1 tablet (40 mg total) by mouth daily. 12/10/18   Mahala Menghini, PA-C  fexofenadine (ALLEGRA) 180 MG tablet Take 180 mg by mouth daily.     [provider]  fluticasone (FLONASE) 50 MCG/ACT nasal spray Place 2 sprays into both nostrils daily.     [provider]  HUMALOG KWIKPEN 100 UNIT/ML KwikPen Inject 10-22 Units into the skin 4 (four) times daily. Sliding Scale Insulin 06/08/18   [provider]  hydrochlorothiazide (MICROZIDE) 12.5 MG capsule Take 1 capsule (12.5 mg total) by mouth daily. Patient not taking: Reported on 04/22/2019 02/17/18   Jonnie Kind, MD  Insulin Glargine-Lixisenatide [facility]) 100-33 UNT-MCG/ML SOPN Inject 60 Units into the skin at bedtime.     [provider]  ipratropium (ATROVENT) 0.02 % nebulizer solution Take 0.5 mg by nebulization every 4 (four) hours as needed for wheezing or shortness of breath.    [provider]  KOMBIGLYZE XR 2.05-998 MG TB24 Take 1 tablet by mouth 2 (two) times daily.  02/13/16   [provider]  medroxyPROGESTERone (PROVERA) 10 MG tablet Take 1 tablet (10 mg total) by mouth daily. The first 10 days of each calendar month 05/24/19   Florian Buff, MD  megestrol (MEGACE) 40 MG tablet 3 tablets a day for  5 days, 2 tablets a day for 5 days then 1 tablet daily 04/22/19   Florian Buff, MD  metFORMIN (GLUCOPHAGE) 500 MG tablet Take by mouth 2 (two) times daily with a meal.    [provider]  pantoprazole (PROTONIX) 40 MG tablet Take 40 mg by mouth 2 (two) times daily.    [provider]  pravastatin (PRAVACHOL) 20 MG tablet Take 1 tablet by mouth daily. 09/03/18   [provider]    Allergies    Ace inhibitors, Beta adrenergic blockers, Augmentin [amoxicillin-pot clavulanate], Benicar [olmesartan], Doxycycline, Eggs or egg-derived products, Influenza vaccine live, and Invokana [canagliflozin]  Review of Systems   Review of Systems  All other systems reviewed and are negative. Ten systems reviewed and are negative for acute change, except as noted in the HPI.    Physical Exam Updated Vital Signs BP (!) 132/92 (BP Location: Right Arm)   Pulse 78   Temp 98.4 F (36.9 C) (Oral)   Resp 20   Ht 5' 9"  (1.753 m)   Wt 125.6 kg   SpO2 100%   BMI 40.89 kg/m   Physical Exam Vitals and nursing note reviewed.  Constitutional:      General: She is in acute distress.     Appearance: Normal appearance. She is well-developed. She is obese. She is not ill-appearing, toxic-appearing or diaphoretic.  HENT:     Head: Normocephalic and atraumatic.     Right Ear: External ear normal.     Left Ear: External ear normal.     Nose: Nose normal.     Mouth/Throat:     Mouth: Mucous membranes are moist.     Pharynx: Oropharynx is clear. No oropharyngeal exudate or posterior oropharyngeal erythema.  Eyes:     Extraocular Movements: Extraocular movements intact.     Comments: No pallor noted in the tarsal conjunctiva bilaterally.  Cardiovascular:     Rate and Rhythm: Normal rate and regular rhythm.     Pulses:  Normal pulses.     Heart sounds: Normal heart sounds. No murmur. No friction rub. No gallop.   Pulmonary:     Effort: Pulmonary effort is normal. No respiratory distress.      Breath sounds: Normal breath sounds. No stridor. No wheezing, rhonchi or rales.  Abdominal:     General: Abdomen is flat and protuberant.     Palpations: Abdomen is soft.     Tenderness: There is abdominal tenderness in the suprapubic area.  Genitourinary:    Comments: Female nursing chaperone present.  Normal-appearing vulvar anatomy.  Small amount of bright red blood noted in the vaginal vault.  No clots.  Normal-appearing vaginal mucosa.  Closed cervical os. Musculoskeletal:        General: Normal range of motion.     Cervical back: Normal range of motion and neck supple. No tenderness.  Skin:    General: Skin is warm and dry.  Neurological:     General: No focal deficit present.     Mental Status: She is alert and oriented to person, place, and time.  Psychiatric:        Mood and Affect: Mood normal.        Behavior: Behavior normal.    ED Results / Procedures / Treatments   Labs (all labs ordered are listed, but only abnormal results are displayed) Labs Reviewed  WET PREP, GENITAL - Abnormal; Notable for the following components:      Result Value   WBC, Wet Prep HPF POC MANY (*)    All other components within normal limits  COMPREHENSIVE METABOLIC PANEL - Abnormal; Notable for the following components:   Potassium 3.4 (*)    Glucose, Bld 164 (*)    Total Protein 6.4 (*)    Albumin 3.4 (*)    All other components within normal limits  CBC - Abnormal; Notable for the following components:   WBC 12.0 (*)    RBC 5.45 (*)    Hemoglobin 16.3 (*)    HCT 48.9 (*)    All other components within normal limits  URINALYSIS, ROUTINE W REFLEX MICROSCOPIC - Abnormal; Notable for the following components:   APPearance HAZY (*)    Specific Gravity, Urine 1.039 (*)    Glucose, UA >=500 (*)    Hgb urine dipstick LARGE (*)    Protein, ur 30 (*)    Leukocytes,Ua TRACE (*)    RBC / HPF >50 (*)    Bacteria, UA FEW (*)    All other components within normal limits  LIPASE, BLOOD   POCT PREGNANCY, URINE  GC/CHLAMYDIA PROBE AMP ([place]) NOT AT [facility]   EKG None  Radiology US PELVIS TRANSVAGINAL NON-OB (TV ONLY)  Result Date: 06/06/2019 CLINICAL DATA:  34 year old female presents with pelvic pain and bleeding. LMP 06/04/2019. EXAM: ULTRASOUND PELVIS TRANSVAGINAL TECHNIQUE: Transvaginal ultrasound examination of the pelvis was performed including evaluation of the uterus, ovaries, adnexal regions, and pelvic cul-de-sac. COMPARISON:  01/28/2018 pelvic sonogram. FINDINGS: Uterus Measurements: 8.7 x 4.1 x 5.5 cm = volume: 98 mL. Anteverted uterus is normal in size and configuration, with no uterine fibroids or other myometrial abnormalities. Endometrium Thickness: 10 mm. No endometrial cavity fluid or focal endometrial mass. Right ovary Measurements: 3.3 x 2.6 x 2.3 cm = volume: 10.3 mL. Normal appearance/no adnexal mass. Left ovary Measurements: 3.5 x 2.4 x 2.6 cm = volume: 11.4 mL. Normal appearance/no adnexal mass. Other findings:  No abnormal free fluid IMPRESSION: 1. Bilayer endometrial thickness 10 mm. No  focal endometrial mass. If bleeding remains unresponsive to hormonal or medical therapy, sonohysterogram should be considered for focal lesion work-up. (Ref: Radiological Reasoning: Algorithmic Workup of Abnormal Vaginal Bleeding with Endovaginal Sonography and Sonohysterography. AJR 2008; [id number]:T01-77). 2. No uterine fibroids. 3. Normal ovaries.  No adnexal masses. Electronically Signed   By: Ilona Sorrel M.D.   On: 06/06/2019 09:05    Procedures Procedures   Medications Ordered in ED Medications  sodium chloride flush (NS) 0.9 % injection 3 mL (has no administration in time range)   ED Course  I have reviewed the triage vital signs and the nursing notes.  Pertinent labs & imaging results that were available during my care of the patient were reviewed by me and considered in my medical decision making (see chart for details).    MDM Rules/Calculators/A&P                       Patient is a 34 year old female with signs and symptoms as noted above.  She has a history of dysfunctional uterine bleeding as well as PCOS.  Basic labs resulted and are generally reassuring.  Very mild leukocytosis at 12.0.  Elevated hemoglobin and hematocrit at 16.3 and 48.9, respectively.  Hyperglycemia at 164. Mild hypokalemia of 3.4.  Patient takes hydrochlorothiazide.  Wet prep shows white blood cells but no clue cells, trichomoniasis, yeast.  UA shows elevated specific gravity at 1.039.  Significant glucosuria.  Patient has a known history of diabetes mellitus.  Blood glucose today was 164.  Trace leukocytes, few bacteria, RBCs greater than 50.  Mild proteinuria.  Transvaginal ultrasound obtained showing a bilayer endometrial thickness of 10 mm.  There is no focal endometrial mass.  No uterine fibroids.  Normal ovaries without adnexal masses.  Radiologist recommends that if bleeding remains unresponsive to normal hormone therapy or medical therapy that she should obtain a sonohysterogram.  Patient has close follow-up with her OB/GYN.  I recommended that she call them tomorrow to discuss his visit as well as her results.  She has NSAIDs at home and is planning to use this for pain management.  Additionally discussed application of heat.  She understands she can return to the emergency department if she has any new or worsening symptoms.  Her questions were answered and she was amicable at the time of discharge.  Her vital signs are stable.  Patient discharged to home/self care.  Condition at discharge: Stable  Note: Portions of this report may have been transcribed using voice recognition software. Every effort was made to ensure accuracy; however, inadvertent computerized transcription errors may be present.    Final Clinical Impression(s) / ED Diagnoses Final diagnoses:  Vaginal bleeding  Pelvic pain   Rx / DC Orders ED Discharge Orders    None       Rayna Sexton,  PA-C 06/06/19 1020    Quintella Reichert, MD 06/07/19 1005

## 2019-06-06 NOTE — MAU Note (Signed)
Report given to [facility], ED Charge RN. Pt transported via wheelchair to triage.

## 2019-06-06 NOTE — Discharge Instructions (Signed)
Please follow-up with your OB/GYN tomorrow to discuss this visit as well as the results of your ultrasound and labs.  You can find these results on MyChart.  There is information in this paperwork to access MyChart.  If your symptoms worsen please do not hesitate to return to the emergency department.  It was a pleasure to meet you.

## 2019-06-06 NOTE — MAU Provider Note (Signed)
First Provider Initiated Contact with Patient 06/06/19 [phone]      S [name] is a 34 y.o. [place] non-pregnant female who presents to MAU today with complaint of abdominal pain and bleeding. She states she was evaluated for vaginal bleeding by Dr. Elonda Husky and was given megace.  She states she discontinued the megace and was suppose to start Provera, but she started having bleeding Saturday.  She describes the pain as a rectal pressure.  She goes on to state that she can not describe the pain, but it comes and goes.   She states she is having moderate to heavy bleeding and passing clots.  She states she has used a heating pad and taken 4 ibuprofen without relief of her pain.  Patient continues to repeat that "I ain't never had a pain like this." She rates the pain a 6/10.  O BP (!) 132/92 (BP Location: Right Arm)   Pulse 78   Temp 98.4 F (36.9 C) (Oral)   Resp 20   Ht 5' 9"  (1.753 m)   Wt 126.3 kg   SpO2 100%   BMI 41.11 kg/m  Physical Exam  Constitutional: She is oriented to person, place, and time. She appears well-developed and well-nourished.  HENT:  Head: Normocephalic and atraumatic.  Eyes: Conjunctivae are normal.  Cardiovascular: Normal rate, regular rhythm and normal heart sounds.  Respiratory: Effort normal.  GI: Soft.  Musculoskeletal:        General: Normal range of motion.     Cervical back: Normal range of motion.  Neurological: She is alert and oriented to person, place, and time.  Skin: Skin is warm and dry.  Psychiatric: She has a normal mood and affect. Her behavior is normal.    A Non pregnant female Medical screening exam complete Abdominal Pain Vaginal Bleeding  P Patient informed that [facility] MAU does not provide gynecological services.   Offered and accepts option of transfer to [facility] for further evaluation. Provider contacted and informed of patient condition and need for transfer.  Patient may return to MAU as needed for pregnancy related  complaints  Gavin Pound, CNM 06/06/2019 6:08 AM

## 2019-06-06 NOTE — MAU Note (Addendum)
Pt reports at 1:00a she started having abdominal pain/pressure. Tried heating pad and ibuprofen without relief. Started period on Saturday. "Feels like something big is coming out of vagina". Unsure if clot or what it is. Saw Dr. Elonda Husky recently and was told she had a thickened endometrium. Given Megace. Finished about 3 weeks ago, but didn't work and suppose to start Provera tomorrow.

## 2019-06-06 NOTE — ED Triage Notes (Signed)
Pt c/o abd pain and vaginal bleed since this morning at 1:30 am.

## 2019-06-06 NOTE — ED Notes (Signed)
Patient verbalizes understanding of discharge instructions . Opportunity for questions and answers were provided . Armband removed by staff ,Pt discharged from ED. W/C  offered at D/C  and Declined W/C at D/C and was escorted to lobby by RN.  

## 2019-06-07 LAB — GC/CHLAMYDIA PROBE AMP (~~LOC~~) NOT AT ARMC
Chlamydia: NEGATIVE
Comment: NEGATIVE
Comment: NORMAL
Neisseria Gonorrhea: NEGATIVE

## 2019-06-09 ENCOUNTER — Other Ambulatory Visit: Payer: Self-pay | Admitting: Obstetrics & Gynecology

## 2019-06-09 MED ORDER — MEGESTROL ACETATE 40 MG PO TABS: ORAL_TABLET | ORAL | 1 refills | Status: DC

## 2019-06-10 ENCOUNTER — Encounter: Payer: Self-pay | Admitting: Gastroenterology

## 2019-06-10 ENCOUNTER — Other Ambulatory Visit: Payer: Self-pay

## 2019-06-10 ENCOUNTER — Ambulatory Visit: Payer: 59 | Admitting: Gastroenterology

## 2019-06-10 VITALS — BP 121/83 | HR 64 | Temp 97.7°F | Ht 69.0 in | Wt 278.2 lb

## 2019-06-10 DIAGNOSIS — K7581 Nonalcoholic steatohepatitis (NASH): Secondary | ICD-10-CM | POA: Diagnosis not present

## 2019-06-10 DIAGNOSIS — E1165 Type 2 diabetes mellitus with hyperglycemia: Secondary | ICD-10-CM | POA: Diagnosis not present

## 2019-06-10 DIAGNOSIS — K219 Gastro-esophageal reflux disease without esophagitis: Secondary | ICD-10-CM | POA: Diagnosis not present

## 2019-06-10 DIAGNOSIS — E119 Type 2 diabetes mellitus without complications: Secondary | ICD-10-CM | POA: Diagnosis not present

## 2019-06-10 DIAGNOSIS — E782 Mixed hyperlipidemia: Secondary | ICD-10-CM | POA: Diagnosis not present

## 2019-06-10 DIAGNOSIS — J060 Acute laryngopharyngitis: Secondary | ICD-10-CM | POA: Diagnosis not present

## 2019-06-10 DIAGNOSIS — F17200 Nicotine dependence, unspecified, uncomplicated: Secondary | ICD-10-CM | POA: Diagnosis not present

## 2019-06-10 DIAGNOSIS — G589 Mononeuropathy, unspecified: Secondary | ICD-10-CM | POA: Diagnosis not present

## 2019-06-10 DIAGNOSIS — J019 Acute sinusitis, unspecified: Secondary | ICD-10-CM | POA: Diagnosis not present

## 2019-06-10 DIAGNOSIS — I10 Essential (primary) hypertension: Secondary | ICD-10-CM | POA: Diagnosis not present

## 2019-06-10 NOTE — Progress Notes (Signed)
Primary Care Physician: [name], MD  Primary Gastroenterologist:  Garfield Cornea, MD   Chief Complaint  Patient presents with  . Gastroesophageal Reflux    better    HPI: Cheryl Black is a 34 y.o. female here for follow-up of GERD and fatty liver.  Transaminitis likely due to steatohepatitis.  Liver biopsy in 2013 with mild early steatohepatitis.  History of chronic fleeting and positional right upper quadrant pain felt to be due to musculoskeletal versus postcholecystectomy syndrome.  She had EGD in September 2020 showing mild erosive reflux esophagitis.  Esophagus was dilated for her history of dysphagia.  She has been on pantoprazole 40 mg twice daily.  Added Pepcid 40 mg daily.  Patient is doing well.  Weight is down 15 pounds in the past year.  She is now back on statin for her cholesterol.  She goes for labs today.  Recently diagnosed with endometriosis.  Denies any significant reflux symptoms.  Some vague dysphagia symptoms but overall improved since esophageal dilation last year.  No nausea or vomiting.  No upper abdominal pain.  Some crampy abdominal pain with menstrual cycles.  Bowel movements regular.  No melena or rectal bleeding.  Current Outpatient Medications  Medication Sig Dispense Refill  . acetaminophen (TYLENOL) 500 MG tablet Take 1,000 mg by mouth every 6 (six) hours as needed (for pain/headaches.).     Marland Kitchen allopurinol (ZYLOPRIM) 300 MG tablet Take 300 mg by mouth daily.     . butalbital-acetaminophen-caffeine (FIORICET, ESGIC) 50-325-40 MG tablet Take 1 tablet by mouth daily as needed (headaches.).     Marland Kitchen calcium carbonate (TUMS CHEWY BITES) 750 MG chewable tablet Chew 1 tablet by mouth as needed for heartburn.    . dapagliflozin propanediol (FARXIGA) 5 MG TABS tablet Take 10 mg by mouth daily.     . famotidine (PEPCID) 40 MG tablet Take 1 tablet (40 mg total) by mouth daily. 90 tablet 3  . fexofenadine (ALLEGRA) 180 MG tablet Take 180 mg by mouth daily.      . fluticasone (FLONASE) 50 MCG/ACT nasal spray Place 2 sprays into both nostrils daily.     . hydrochlorothiazide (MICROZIDE) 12.5 MG capsule Take 1 capsule (12.5 mg total) by mouth daily. 30 capsule 3  . medroxyPROGESTERone (PROVERA) 10 MG tablet Take 1 tablet (10 mg total) by mouth daily. The first 10 days of each calendar month 10 tablet 11  . megestrol (MEGACE) 40 MG tablet 3 tablets a day for 5 days, 2 tablets a day for 5 days then 1 tablet daily 45 tablet 1  . metFORMIN (GLUCOPHAGE) 500 MG tablet Take by mouth 2 (two) times daily with a meal.    . OZEMPIC, 0.25 OR 0.5 MG/DOSE, 2 MG/1.5ML SOPN Inject 0.5 mg into the skin once a week.    . pantoprazole (PROTONIX) 40 MG tablet Take 40 mg by mouth 2 (two) times daily.    . pravastatin (PRAVACHOL) 20 MG tablet Take 1 tablet by mouth daily.    Marland Kitchen KOMBIGLYZE XR 2.05-998 MG TB24 Take 1 tablet by mouth 2 (two) times daily.   11   No current facility-administered medications for this visit.    Allergies as of 06/10/2019 - Review Complete 06/10/2019  Allergen Reaction Noted  . Ace inhibitors Anaphylaxis 03/09/2016  . Beta adrenergic blockers Other (See Comments) 10/21/2016  . Augmentin [amoxicillin-pot clavulanate] Other (See Comments) 03/03/2016  . Benicar [olmesartan] Other (See Comments) 06/08/2012  . Doxycycline Other (See Comments) 06/08/2012  .  Eggs or egg-derived products  09/16/2017  . Influenza vaccine live Swelling 10/13/2010  . Invokana [canagliflozin] Other (See Comments) 03/03/2016    ROS:  General: Negative for anorexia, weight loss, fever, chills, fatigue, weakness. ENT: Negative for hoarseness, difficulty swallowing , nasal congestion. CV: Negative for chest pain, angina, palpitations, dyspnea on exertion, peripheral edema.  Respiratory: Negative for dyspnea at rest, dyspnea on exertion, cough, sputum, wheezing.  GI: See history of present illness. GU:  Negative for dysuria, hematuria, urinary incontinence, urinary  frequency, nocturnal urination.  Endo: Negative for unusual weight change.    Physical Examination:   BP 121/83   Pulse 64   Temp 97.7 F (36.5 C) (Temporal)   Ht 5' 9"  (1.753 m)   Wt 278 lb 3.2 oz (126.2 kg)   LMP 06/04/2019 (Exact Date)   BMI 41.08 kg/m   General: Well-nourished, well-developed in no acute distress.  Eyes: No icterus. Mouth: masked Lungs: Clear to auscultation bilaterally.  Heart: Regular rate and rhythm, no murmurs rubs or gallops.  Abdomen: Bowel sounds are normal, nontender, nondistended, no hepatosplenomegaly or masses, no abdominal bruits or hernia , no rebound or guarding.   Extremities: No lower extremity edema. No clubbing or deformities. Neuro: Alert and oriented x 4   Skin: Warm and dry, no jaundice.   Psych: Alert and cooperative, normal mood and affect.  Labs:  Lab Results  Component Value Date   ALT 38 06/06/2019   AST 25 06/06/2019   ALKPHOS 70 06/06/2019   BILITOT 0.6 06/06/2019   Lab Results  Component Value Date   CREATININE 0.75 06/06/2019   BUN 10 06/06/2019   NA 138 06/06/2019   K 3.4 (L) 06/06/2019   CL 106 06/06/2019   CO2 23 06/06/2019   Lab Results  Component Value Date   WBC 12.0 (H) 06/06/2019   HGB 16.3 (H) 06/06/2019   HCT 48.9 (H) 06/06/2019   MCV 89.7 06/06/2019   PLT 263 06/06/2019     Lab Results  Component Value Date   LIPASE 34 06/06/2019    Imaging Studies: US PELVIS TRANSVAGINAL NON-OB (TV ONLY)  Result Date: 06/06/2019 CLINICAL DATA:  34 year old female presents with pelvic pain and bleeding. LMP 06/04/2019. EXAM: ULTRASOUND PELVIS TRANSVAGINAL TECHNIQUE: Transvaginal ultrasound examination of the pelvis was performed including evaluation of the uterus, ovaries, adnexal regions, and pelvic cul-de-sac. COMPARISON:  01/28/2018 pelvic sonogram. FINDINGS: Uterus Measurements: 8.7 x 4.1 x 5.5 cm = volume: 98 mL. Anteverted uterus is normal in size and configuration, with no uterine fibroids or other  myometrial abnormalities. Endometrium Thickness: 10 mm. No endometrial cavity fluid or focal endometrial mass. Right ovary Measurements: 3.3 x 2.6 x 2.3 cm = volume: 10.3 mL. Normal appearance/no adnexal mass. Left ovary Measurements: 3.5 x 2.4 x 2.6 cm = volume: 11.4 mL. Normal appearance/no adnexal mass. Other findings:  No abnormal free fluid IMPRESSION: 1. Bilayer endometrial thickness 10 mm. No focal endometrial mass. If bleeding remains unresponsive to hormonal or medical therapy, sonohysterogram should be considered for focal lesion work-up. (Ref: Radiological Reasoning: Algorithmic Workup of Abnormal Vaginal Bleeding with Endovaginal Sonography and Sonohysterography. AJR 2008; 500:X38-18). 2. No uterine fibroids. 3. Normal ovaries.  No adnexal masses. Electronically Signed   By: Ilona Sorrel M.D.   On: 06/06/2019 09:05

## 2019-06-10 NOTE — Patient Instructions (Signed)
1. Continue pantoprazole 7m twice daily before meals. 2. You can continue Pepcid on a as needed basis. 3. Please share upcoming labs with [facility] they become available. 4. We will see back in 1 year, call sooner if needed.

## 2019-06-12 ENCOUNTER — Encounter: Payer: Self-pay | Admitting: Gastroenterology

## 2019-06-12 NOTE — Assessment & Plan Note (Addendum)
Current LFTs normal. Continue to encourage slow gradual weight loss, exercise, tight glycemic control, limit etoh use. Return to the office in one year.

## 2019-06-12 NOTE — Assessment & Plan Note (Addendum)
Clinically doing well. Gerd symptoms well controlled on pantoprazole 69m bid. Rarely has to add Pepcid. Encouraged ongoing weight loss. Reinforced antireflux measures. Return to the office in one year.

## 2019-06-22 MED FILL — OZEMPIC 0.25 OR 0.5 MG/DOSE: 2 | 28 days supply | Qty: 2 | Fill #1

## 2019-06-23 MED FILL — DEXCOM G6 SENSOR MISC: 30 days supply | Qty: 3 | Fill #3

## 2019-07-10 MED FILL — PRAVASTATIN SODIUM 20 MG TA: 20 | 90 days supply | Qty: 90 | Fill #2

## 2019-07-11 ENCOUNTER — Other Ambulatory Visit: Payer: Self-pay | Admitting: Obstetrics & Gynecology

## 2019-07-13 DIAGNOSIS — M1000 Idiopathic gout, unspecified site: Secondary | ICD-10-CM | POA: Diagnosis not present

## 2019-07-13 DIAGNOSIS — J4540 Moderate persistent asthma, uncomplicated: Secondary | ICD-10-CM | POA: Diagnosis not present

## 2019-07-13 DIAGNOSIS — F17200 Nicotine dependence, unspecified, uncomplicated: Secondary | ICD-10-CM | POA: Diagnosis not present

## 2019-07-13 DIAGNOSIS — Z0001 Encounter for general adult medical examination with abnormal findings: Secondary | ICD-10-CM | POA: Diagnosis not present

## 2019-07-13 DIAGNOSIS — E1165 Type 2 diabetes mellitus with hyperglycemia: Secondary | ICD-10-CM | POA: Diagnosis not present

## 2019-07-13 DIAGNOSIS — K219 Gastro-esophageal reflux disease without esophagitis: Secondary | ICD-10-CM | POA: Diagnosis not present

## 2019-07-13 DIAGNOSIS — E782 Mixed hyperlipidemia: Secondary | ICD-10-CM | POA: Diagnosis not present

## 2019-07-13 DIAGNOSIS — R945 Abnormal results of liver function studies: Secondary | ICD-10-CM | POA: Diagnosis not present

## 2019-07-13 MED FILL — FARXIGA 10 MG TABLET: 10 | 30 days supply | Qty: 30 | Fill #0

## 2019-07-14 MED FILL — metFORMIN HCL ER 500 MG TB2: 500 | 30 days supply | Qty: 120 | Fill #2

## 2019-07-14 MED FILL — ALLOPURINOL 300 MG TABS: 300 | 90 days supply | Qty: 90 | Fill #0

## 2019-07-14 MED FILL — PANTOPRAZOLE SOD DR 40 MG T: 40 | 90 days supply | Qty: 180 | Fill #0

## 2019-07-14 MED FILL — HYDROCHLOROTHIAZIDE 12.5 MG: 12.5 | 90 days supply | Qty: 90 | Fill #0

## 2019-07-20 MED FILL — OZEMPIC 0.25 OR 0.5 MG/DOSE: 2 | 28 days supply | Qty: 2 | Fill #2

## 2019-08-01 MED FILL — DEXCOM G6 SENSOR MISC: 30 days supply | Qty: 3 | Fill #4

## 2019-08-04 ENCOUNTER — Ambulatory Visit: Payer: 59 | Admitting: Obstetrics & Gynecology

## 2019-08-05 ENCOUNTER — Ambulatory Visit (INDEPENDENT_AMBULATORY_CARE_PROVIDER_SITE_OTHER): Payer: 59 | Admitting: Obstetrics & Gynecology

## 2019-08-05 ENCOUNTER — Encounter: Payer: Self-pay | Admitting: Obstetrics & Gynecology

## 2019-08-05 VITALS — BP 118/80 | HR 81 | Ht 69.0 in | Wt 279.8 lb

## 2019-08-05 DIAGNOSIS — N938 Other specified abnormal uterine and vaginal bleeding: Secondary | ICD-10-CM | POA: Diagnosis not present

## 2019-08-05 DIAGNOSIS — E282 Polycystic ovarian syndrome: Secondary | ICD-10-CM

## 2019-08-05 DIAGNOSIS — Z30430 Encounter for insertion of intrauterine contraceptive device: Secondary | ICD-10-CM | POA: Diagnosis not present

## 2019-08-05 MED ORDER — PROGESTERONE 200 MG PO CAPS
400.0000 mg | ORAL_CAPSULE | Freq: Every day | ORAL | 0 refills | Status: DC
Start: 2019-08-05 — End: 2020-10-04

## 2019-08-05 MED ORDER — LEVONORGESTREL 20 MCG/24HR IU IUD
INTRAUTERINE_SYSTEM | Freq: Once | INTRAUTERINE | Status: AC
  Administered 2019-08-05: 1 via INTRAUTERINE

## 2019-08-05 NOTE — Progress Notes (Signed)
Cheryl Black [id number] with ongoing anovulatory associated DUB I have been unsuccessful in managing with megestrol  Although she is confident she is not going to seek IVF for fertility I am uncomfortable offering her something surgical that would take that option away at the present time  Discussed options in full, will try IUD for longer term management and prometrium for short term synchronization      IUD Insertion Procedure Note  Pre-operative Diagnosis: DUB unmanaged on megestrol  Post-operative Diagnosis: same  Indications: contraception  Procedure Details  Urine pregnancy test was not done.  The risks (including infection, bleeding, pain, and uterine perforation) and benefits of the procedure were explained to the patient and Written informed consent was obtained.    Cervix cleansed with Betadine. Uterus sounded to 9 cm. IUD inserted without difficulty. String visible and trimmed. Patient tolerated procedure well.  IUD Information: Mirena, Lot # [id number], Expiration date 10/23  Removal 08/06/24.  Condition: Stable  Complications: None  Plan:  The patient was advised to call for any fever or for prolonged or severe pain or bleeding. She was advised to use OTC ibuprofen as needed for mild to moderate pain.   Attending Physician Documentation: I placed the IUD

## 2019-08-11 MED FILL — FARXIGA 10 MG TABLET: 10 | 30 days supply | Qty: 30 | Fill #0

## 2019-08-29 MED FILL — DEXCOM G6 SENSOR MISC: 30 days supply | Qty: 3 | Fill #5

## 2019-08-29 MED FILL — OZEMPIC 0.25 OR 0.5 MG/DOSE: 2 | 28 days supply | Qty: 2 | Fill #3

## 2019-08-30 DIAGNOSIS — G4733 Obstructive sleep apnea (adult) (pediatric): Secondary | ICD-10-CM | POA: Diagnosis not present

## 2019-09-15 ENCOUNTER — Other Ambulatory Visit: Payer: Self-pay | Admitting: Oncology

## 2019-09-15 ENCOUNTER — Encounter: Payer: Self-pay | Admitting: Oncology

## 2019-09-15 ENCOUNTER — Telehealth: Payer: Self-pay | Admitting: Oncology

## 2019-09-15 DIAGNOSIS — U071 COVID-19: Secondary | ICD-10-CM

## 2019-09-15 MED FILL — AZITHROMYCIN 250 MG TABLET: 250 | 5 days supply | Qty: 6 | Fill #0

## 2019-09-15 MED FILL — predniSONE 10 MG (21) TBPK: 10 | 6 days supply | Qty: 21 | Fill #0

## 2019-09-15 MED FILL — ALBUTEROL SULFATE HFA 108 (: 108 (90 BAS | 16 days supply | Qty: 18 | Fill #0

## 2019-09-15 NOTE — Telephone Encounter (Signed)
I connected by phone with  Cheryl Black to discuss the potential use of an new treatment for mild to moderate COVID-19 viral infection in non-hospitalized patients.   This patient is a age/sex that meets the FDA criteria for Emergency Use Authorization of casirivimab\imdevimab.  Has a (+) direct SARS-CoV-2 viral test result 1. Has mild or moderate COVID-19  2. Is ? 35 years of age and weighs ? 40 kg 3. Is NOT hospitalized due to COVID-19 4. Is NOT requiring oxygen therapy or requiring an increase in baseline oxygen flow rate due to COVID-19 5. Is within 10 days of symptom onset 6. Has at least one of the high risk factor(s) for progression to severe COVID-19 and/or hospitalization as defined in EUA. Specific high risk criteria : Past Medical History:  Diagnosis Date  . Acid reflux   . Asthma   . Clostridium difficile infection    in the past  . Diabetes mellitus   . Fatty liver   . Fibrocystic breast disease   . Gout   . Hidradenitis   . History of kidney stones   . Hyperlipemia   . Obesity   . Polycystic ovary syndrome   . PONV (postoperative nausea and vomiting)   . Sleep apnea   . Smoker   ?   Symptom onset  09/13/19   I have spoken and communicated the following to the patient or parent/caregiver:   1. FDA has authorized the emergency use of casirivimab\imdevimab for the treatment of mild to moderate COVID-19 in adults and pediatric patients with positive results of direct SARS-CoV-2 viral testing who are 33 years of age and older weighing at least 40 kg, and who are at high risk for progressing to severe COVID-19 and/or hospitalization.   2. The significant known and potential risks and benefits of casirivimab\imdevimab, and the extent to which such potential risks and benefits are unknown.   3. Information on available alternative treatments and the risks and benefits of those alternatives, including clinical trials.   4. Patients treated with casirivimab\imdevimab should  continue to self-isolate and use infection control measures (e.g., wear mask, isolate, social distance, avoid sharing personal items, clean and disinfect "high touch" surfaces, and frequent handwashing) according to CDC guidelines.    5. The patient or parent/caregiver has the option to accept or refuse casirivimab\imdevimab .   After reviewing this information with the patient, The patient agreed to proceed with receiving casirivimab\imdevimab infusion and will be provided a copy of the Fact sheet prior to receiving the infusion.Rulon Abide, AGNP-C [phone] ([place])

## 2019-09-16 ENCOUNTER — Other Ambulatory Visit (HOSPITAL_COMMUNITY): Payer: Self-pay

## 2019-09-16 ENCOUNTER — Ambulatory Visit (HOSPITAL_COMMUNITY)
Admission: RE | Admit: 2019-09-16 | Discharge: 2019-09-16 | Disposition: A | Discharge status: Home / Self Care | Payer: 59 | Source: Ambulatory Visit | Attending: Pulmonary Disease | Admitting: Pulmonary Disease

## 2019-09-16 DIAGNOSIS — U071 COVID-19: Secondary | ICD-10-CM | POA: Diagnosis not present

## 2019-09-16 MED ORDER — FAMOTIDINE IN NACL 20-0.9 MG/50ML-% IV SOLN: 20.0000 mg | Freq: Once | INTRAVENOUS | Status: DC | PRN

## 2019-09-16 MED ORDER — DIPHENHYDRAMINE HCL 50 MG/ML IJ SOLN: 50.0000 mg | Freq: Once | INTRAMUSCULAR | Status: DC | PRN

## 2019-09-16 MED ORDER — EPINEPHRINE 0.3 MG/0.3ML IJ SOAJ: 0.3000 mg | Freq: Once | INTRAMUSCULAR | Status: DC | PRN

## 2019-09-16 MED ORDER — SODIUM CHLORIDE 0.9 % IV SOLN: INTRAVENOUS | Status: DC | PRN

## 2019-09-16 MED ORDER — METHYLPREDNISOLONE SODIUM SUCC 125 MG IJ SOLR: 125.0000 mg | Freq: Once | INTRAMUSCULAR | Status: DC | PRN

## 2019-09-16 MED ORDER — SODIUM CHLORIDE 0.9 % IV SOLN
1200.0000 mg | Freq: Once | INTRAVENOUS | Status: AC
  Administered 2019-09-16: 1200 mg via INTRAVENOUS
  Filled 2019-09-16: qty 10

## 2019-09-16 MED ORDER — ALBUTEROL SULFATE HFA 108 (90 BASE) MCG/ACT IN AERS: 2.0000 | INHALATION_SPRAY | Freq: Once | RESPIRATORY_TRACT | Status: DC | PRN

## 2019-09-16 NOTE — Discharge Instructions (Signed)

## 2019-09-16 NOTE — Progress Notes (Signed)
  Diagnosis: COVID-19  Physician: Asencion Noble, MD  Procedure: [facility] Med: casirivimab\imdevimab infusion - Provided patient with casirivimab\imdevimab fact sheet for patients, parents and caregivers prior to infusion.  Complications: No immediate complications noted.  Discharge: Discharged home   Cheryl Black 09/16/2019

## 2019-09-20 DIAGNOSIS — U071 COVID-19: Secondary | ICD-10-CM | POA: Diagnosis not present

## 2019-09-26 MED FILL — DEXCOM G6 SENSOR MISC: 30 days supply | Qty: 3 | Fill #6

## 2019-09-26 MED FILL — FARXIGA 10 MG TABLET: 10 | 30 days supply | Qty: 30 | Fill #0

## 2019-09-26 MED FILL — metFORMIN HCL ER 500 MG TB2: 500 | 30 days supply | Qty: 30 | Fill #0

## 2019-09-26 MED FILL — DEXCOM G6 TRANSMITTER MISC: 90 days supply | Qty: 1 | Fill #1

## 2019-10-11 MED FILL — OZEMPIC 0.25 OR 0.5 MG/DOSE: 2 | 28 days supply | Qty: 2 | Fill #4

## 2019-11-18 MED FILL — OZEMPIC 0.25 OR 0.5 MG/DOSE: 2 | 28 days supply | Qty: 2 | Fill #5

## 2019-11-21 ENCOUNTER — Other Ambulatory Visit (HOSPITAL_COMMUNITY): Payer: Self-pay | Admitting: Internal Medicine

## 2019-11-22 MED FILL — FARXIGA 10 MG TABLET: 10 | 30 days supply | Qty: 30 | Fill #0

## 2019-11-22 MED FILL — DEXCOM G6 SENSOR MISC: 30 days supply | Qty: 3 | Fill #0

## 2019-11-24 ENCOUNTER — Other Ambulatory Visit (HOSPITAL_COMMUNITY): Payer: Self-pay | Admitting: Internal Medicine

## 2019-11-24 DIAGNOSIS — I10 Essential (primary) hypertension: Secondary | ICD-10-CM | POA: Diagnosis not present

## 2019-11-24 DIAGNOSIS — E119 Type 2 diabetes mellitus without complications: Secondary | ICD-10-CM | POA: Diagnosis not present

## 2019-11-24 DIAGNOSIS — K7581 Nonalcoholic steatohepatitis (NASH): Secondary | ICD-10-CM | POA: Diagnosis not present

## 2019-11-24 DIAGNOSIS — Z8616 Personal history of COVID-19: Secondary | ICD-10-CM | POA: Diagnosis not present

## 2019-11-24 DIAGNOSIS — E785 Hyperlipidemia, unspecified: Secondary | ICD-10-CM | POA: Diagnosis not present

## 2019-11-24 DIAGNOSIS — Z6841 Body Mass Index (BMI) 40.0 and over, adult: Secondary | ICD-10-CM | POA: Diagnosis not present

## 2019-11-24 DIAGNOSIS — E1165 Type 2 diabetes mellitus with hyperglycemia: Secondary | ICD-10-CM | POA: Diagnosis not present

## 2019-11-24 MED FILL — OZEMPIC (1 MG/DOSE) 4 MG/3M: 4 | 84 days supply | Qty: 9 | Fill #0

## 2019-11-24 MED FILL — metFORMIN HCL ER 500 MG TB2: 500 | 90 days supply | Qty: 180 | Fill #0

## 2019-12-13 DIAGNOSIS — G4733 Obstructive sleep apnea (adult) (pediatric): Secondary | ICD-10-CM | POA: Diagnosis not present

## 2020-01-09 ENCOUNTER — Other Ambulatory Visit (HOSPITAL_COMMUNITY): Payer: Self-pay | Admitting: Adult Health Nurse Practitioner

## 2020-01-09 MED FILL — FARXIGA 10 MG TABLET: 10 | 90 days supply | Qty: 90 | Fill #0

## 2020-01-09 MED FILL — DEXCOM G6 SENSOR MISC: 30 days supply | Qty: 3 | Fill #1

## 2020-01-09 MED FILL — DEXCOM G6 TRANSMITTER MISC: 90 days supply | Qty: 1 | Fill #2

## 2020-01-10 MED FILL — HYDROCHLOROTHIAZIDE 12.5 MG: 12.5 | 90 days supply | Qty: 90 | Fill #0

## 2020-01-10 MED FILL — PRAVASTATIN SODIUM 20 MG TA: 20 | 90 days supply | Qty: 90 | Fill #0

## 2020-01-10 MED FILL — ALLOPURINOL 300 MG TABS: 300 | 90 days supply | Qty: 90 | Fill #0

## 2020-01-17 IMAGING — DX DG CHEST 2V
2 series · 2 of 2 positions shown · non-contrast
Comparison: October 21, 2016

CLINICAL DATA: Wheezing and shortness of breath

EXAM:
CHEST - 2 VIEW

[chest pa]
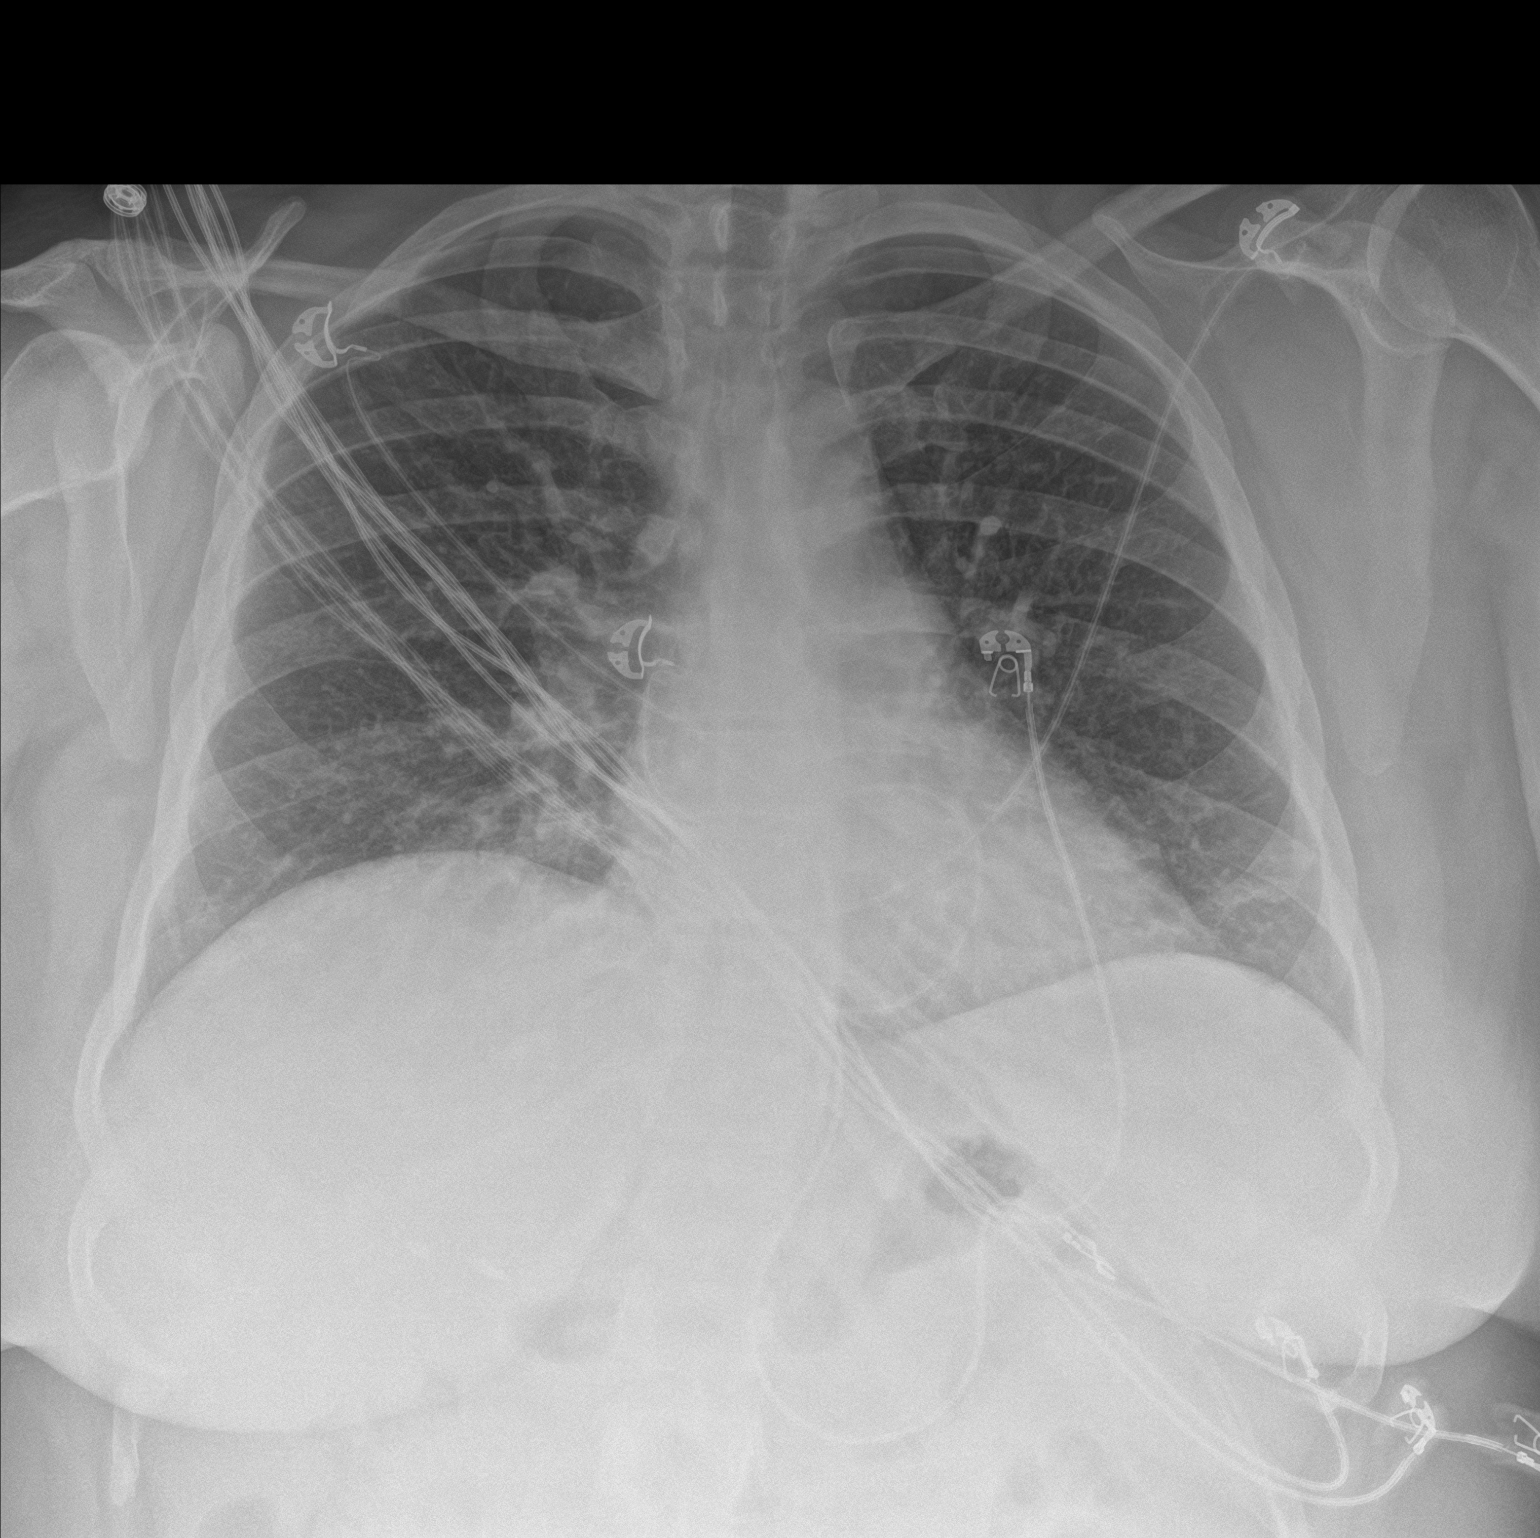

[chest lat]
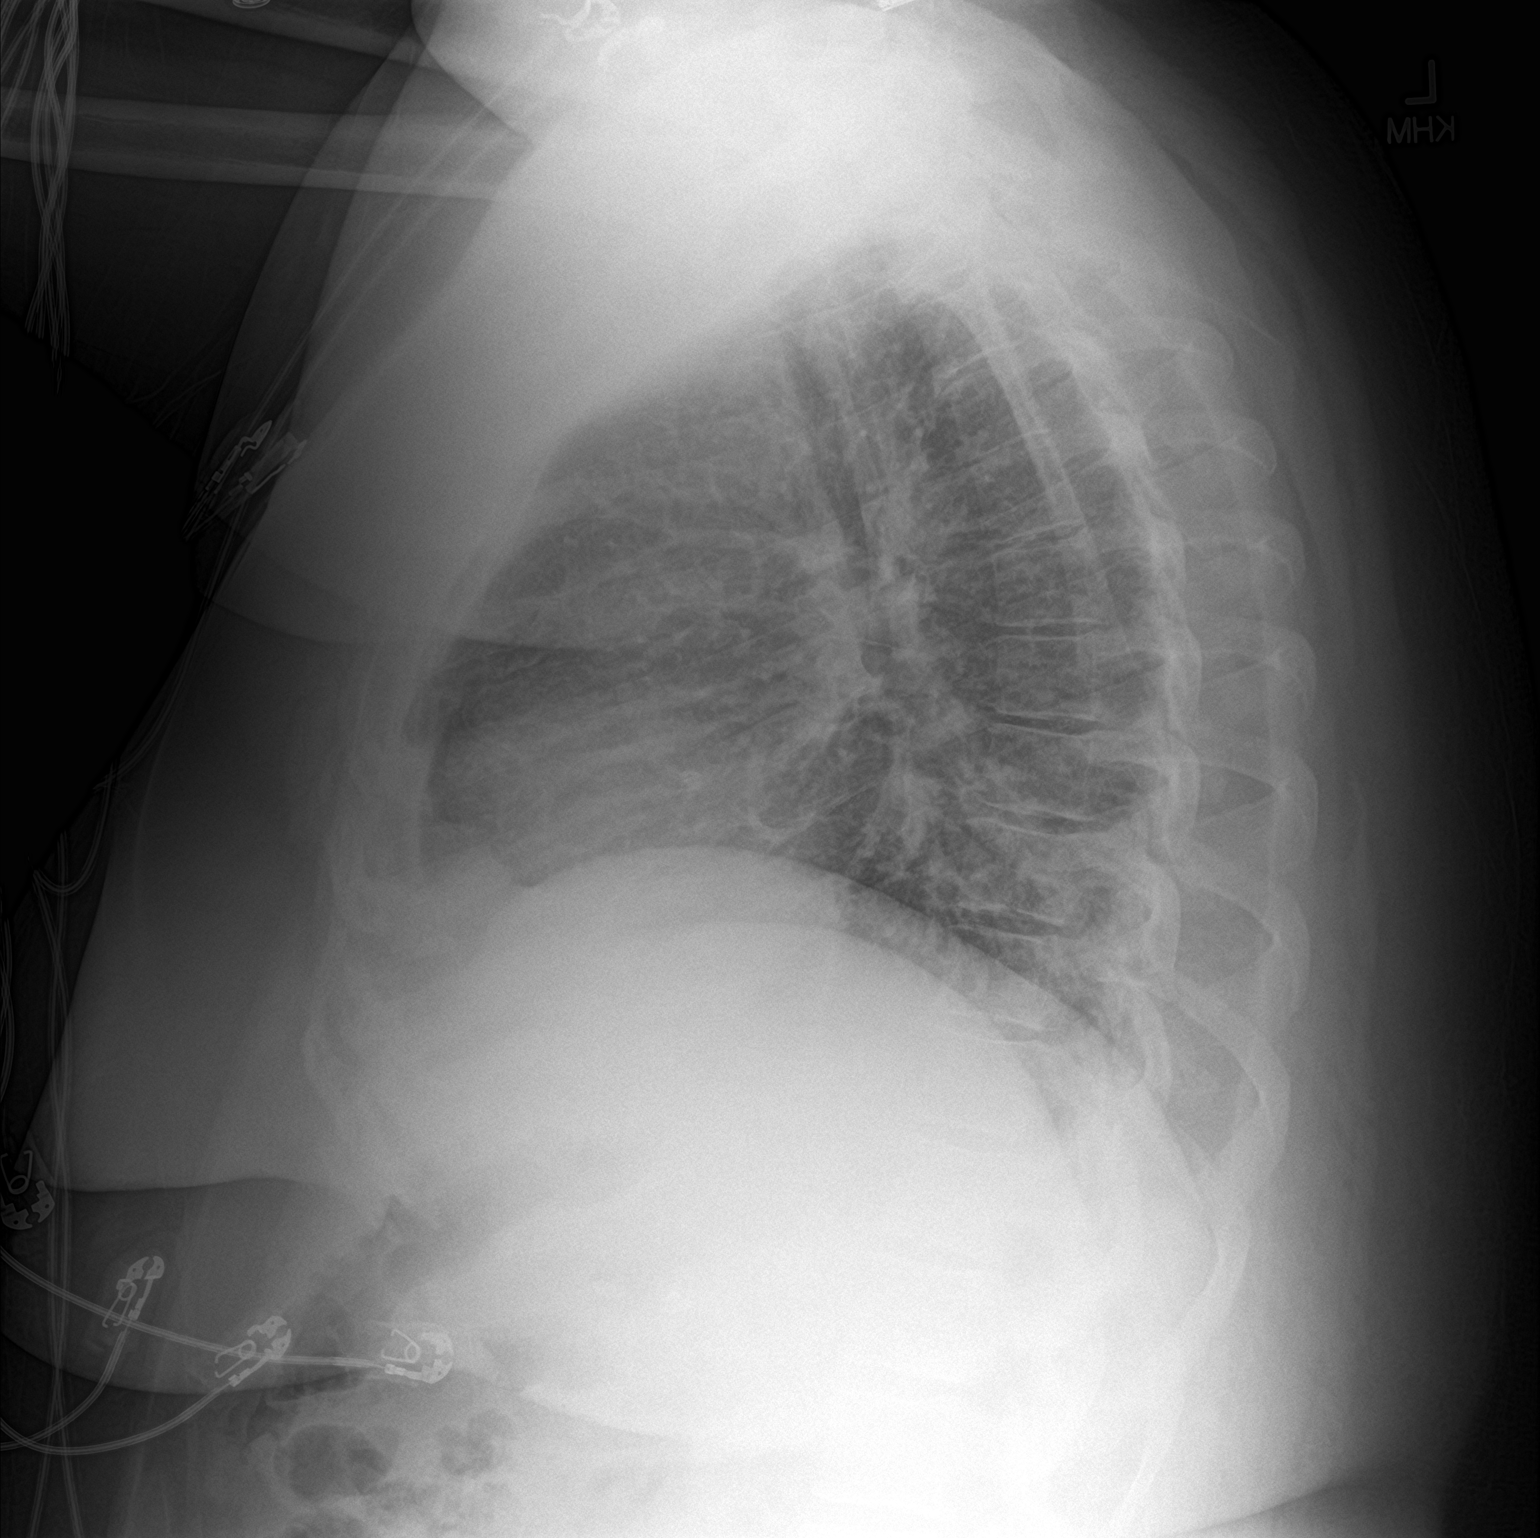

[2 of 2 positions shown; findings below may reference images not displayed]

FINDINGS: There is no evident edema or consolidation. Heart size and pulmonary
vascularity are normal. No adenopathy. No pneumothorax. No bone
lesions.
IMPRESSION: No edema or consolidation.

## 2020-01-19 IMAGING — CR DG CHEST 1V PORT
1 series · 1 of 1 positions shown · non-contrast
Comparison: PA and lateral chest 08/23/2017 and 10/21/2016.

CLINICAL DATA: Increasing shortness of breath since 08/23/2017.

EXAM:
PORTABLE CHEST 1 VIEW

[portable]
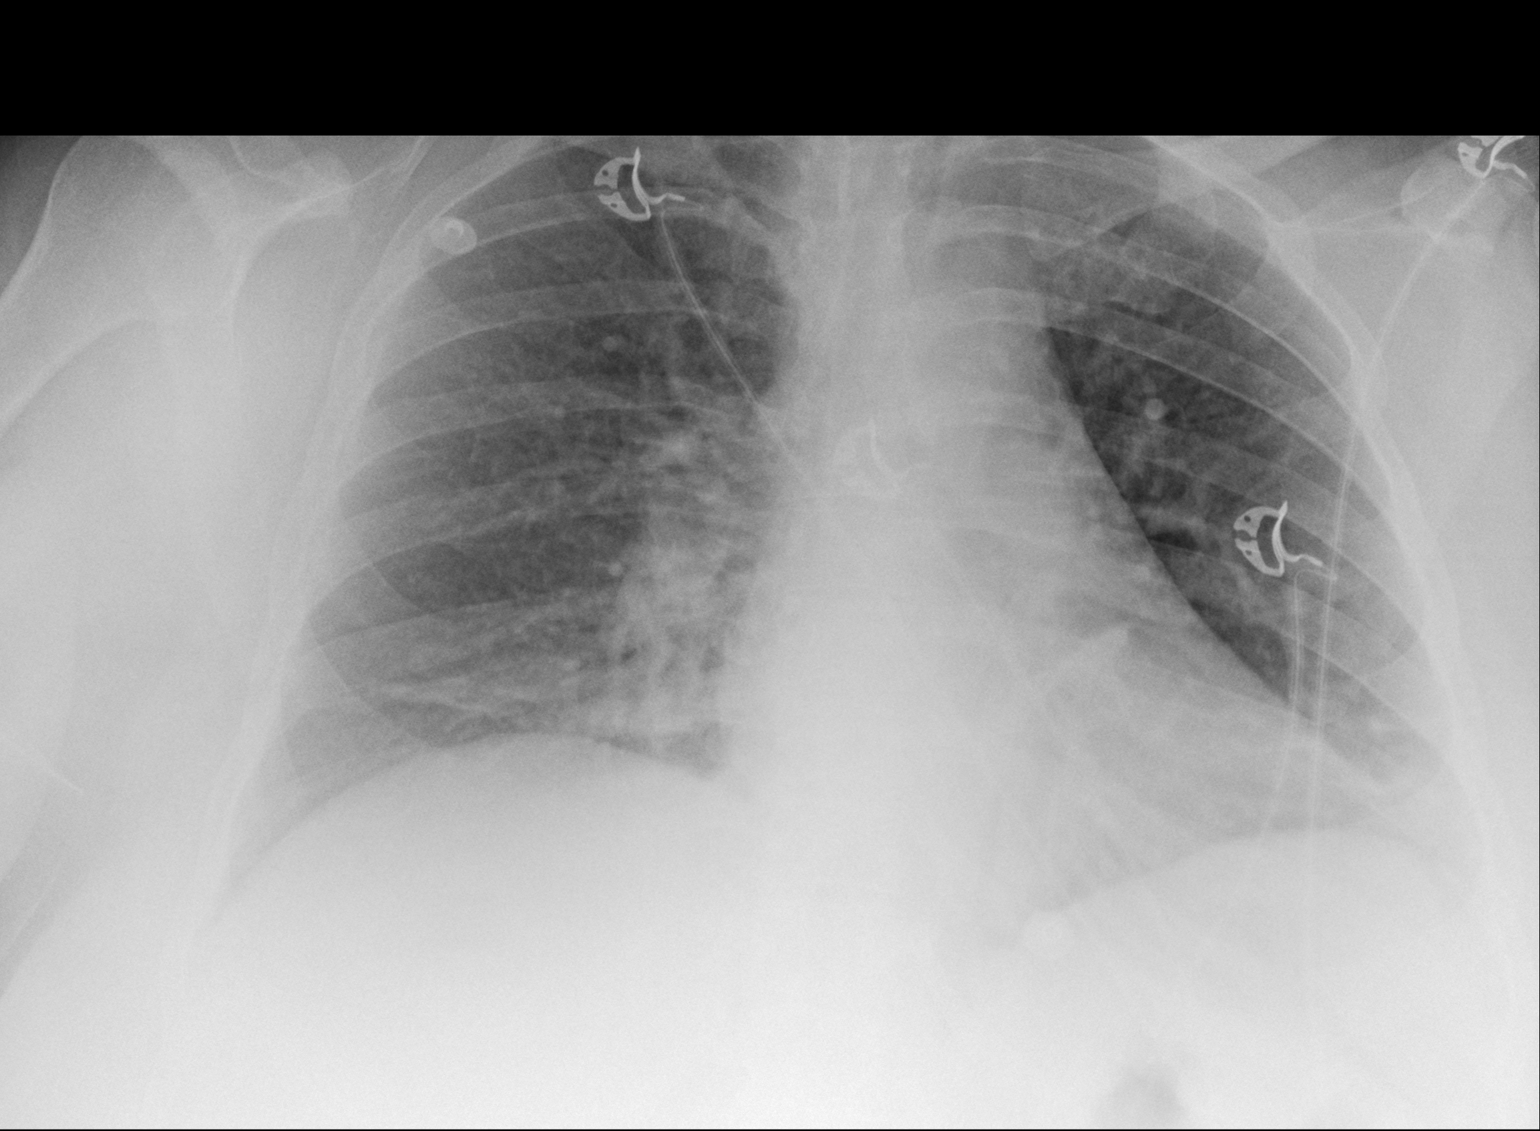

[1 of 1 positions shown; findings below may reference images not displayed]

FINDINGS: Mild subsegmental atelectasis. There is some peribronchial
thickening. No consolidative process is seen lung bases,
pneumothorax or effusion. Heart size is normal. No acute or focal
bony abnormality.
IMPRESSION: Mild bronchitic change without consolidative process.

## 2020-01-20 IMAGING — CT CT ANGIO CHEST
2 of 6 series · 18 of 46 positions shown · IV contrast (Isovue)
Comparison: Chest radiograph, 08/25/2017.

CLINICAL DATA: Central chest lpain since x 3 days. Radiating into
RUQ of abdomen/lower lung. Hx of dm, pcos,Sultonov, [place] liver,
cholecystectomyWorsening of symptoms in last 2 hrs.

EXAM:
CT ANGIOGRAPHY CHEST WITH CONTRAST
TECHNIQUE: Multidetector CT imaging of the chest was performed using the
standard protocol during bolus administration of intravenous
contrast. Multiplanar CT image reconstructions and MIPs were
obtained to evaluate the vascular anatomy.
CONTRAST:  100mL FHRNO7-BXM IOPAMIDOL (FHRNO7-BXM) INJECTION 76%

[Series 5: thins · axial · 0.71mm/px · z∈[-345,-108]mm · 15 of 261 slices shown]
[im 12/261  lung]
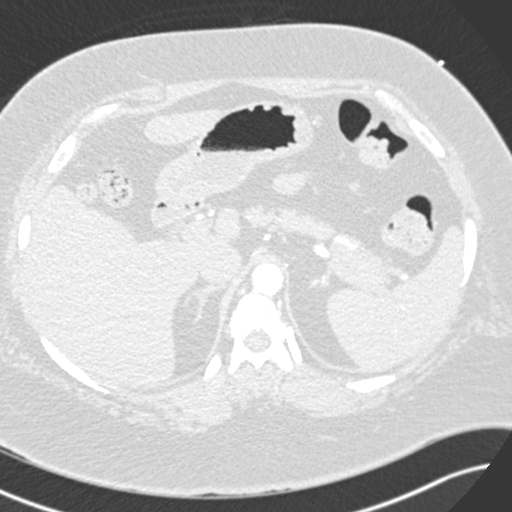
[im 34/261  soft-tissue]
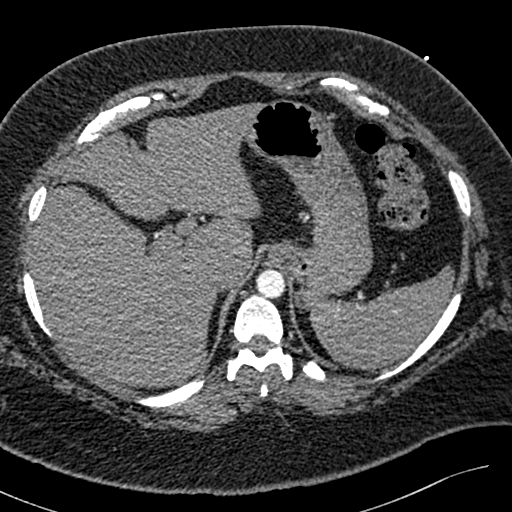
[im 46/261  lung]
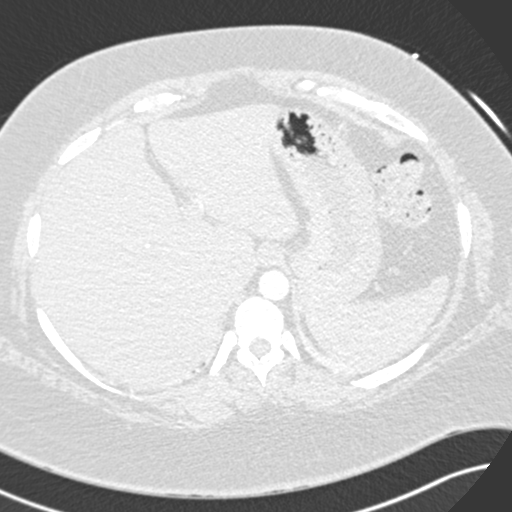
[im 68/261  soft-tissue]
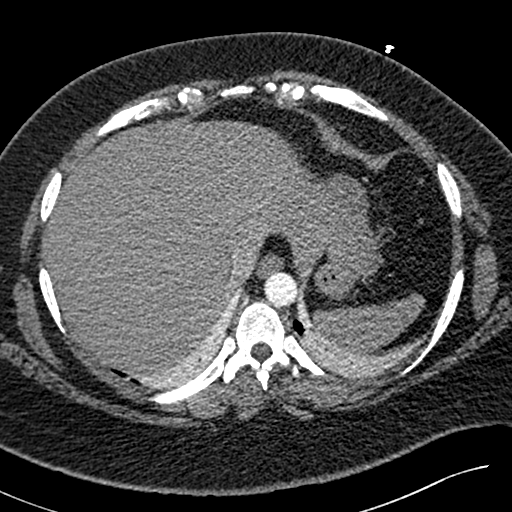
[im 80/261  lung]
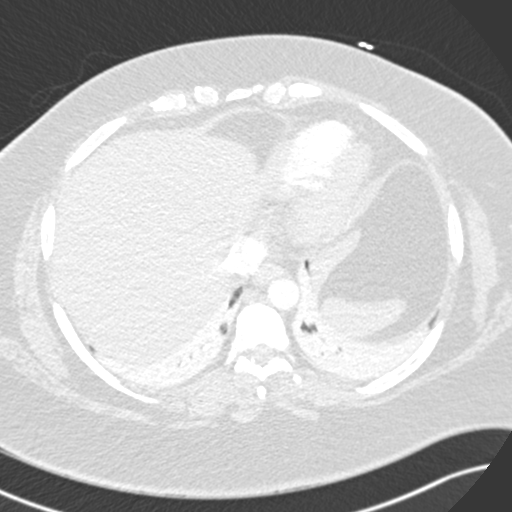
[im 102/261  soft-tissue]
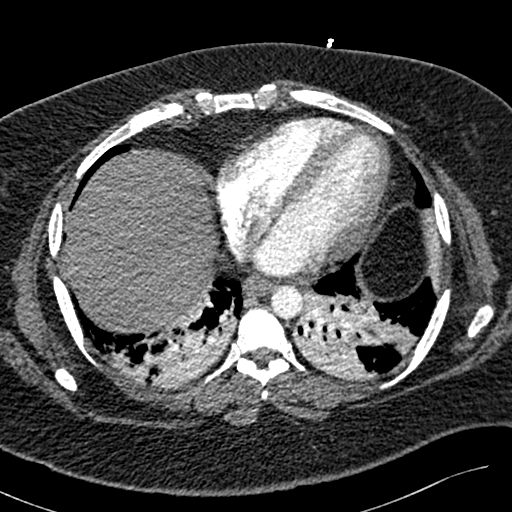
[im 114/261  lung]
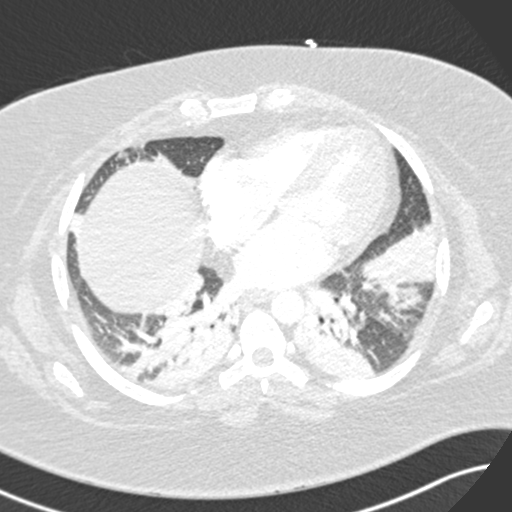
[im 136/261  soft-tissue]
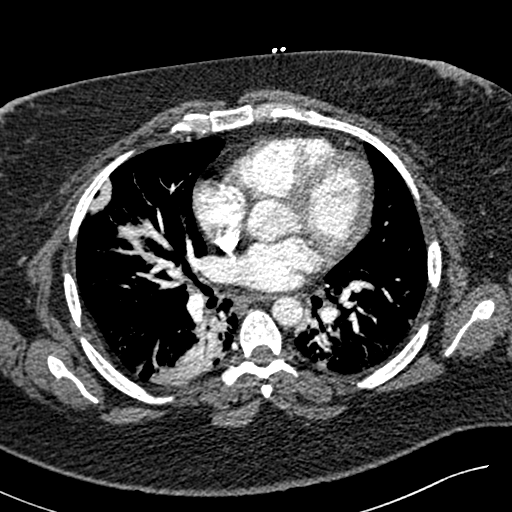
[im 147/261  lung]
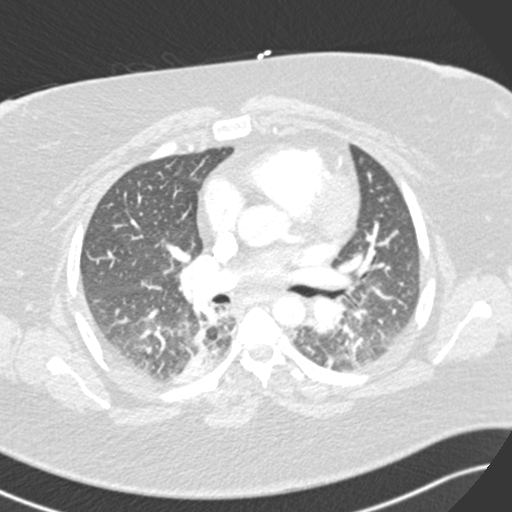
[im 159/261  soft-tissue]
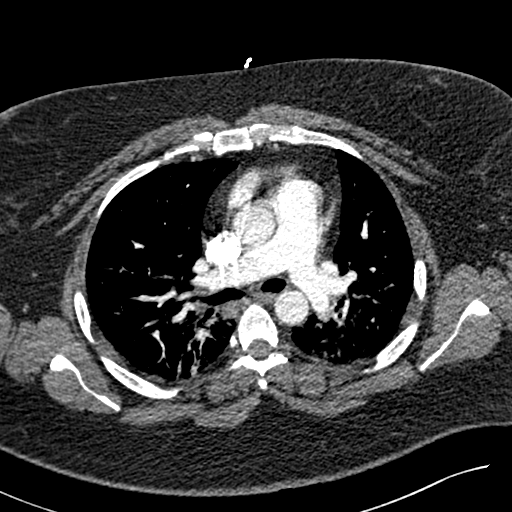
[im 181/261  lung]
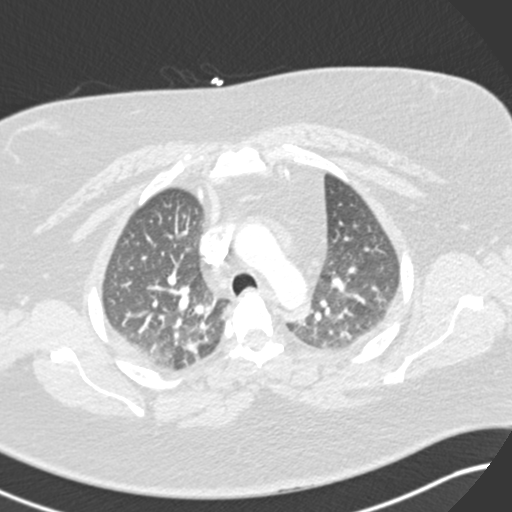
[im 193/261  soft-tissue]
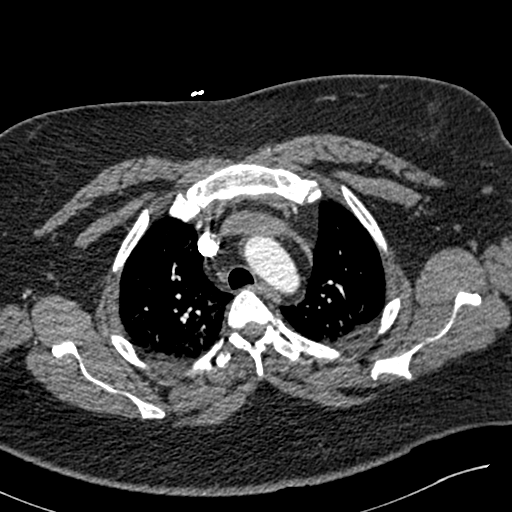
[im 215/261  lung]
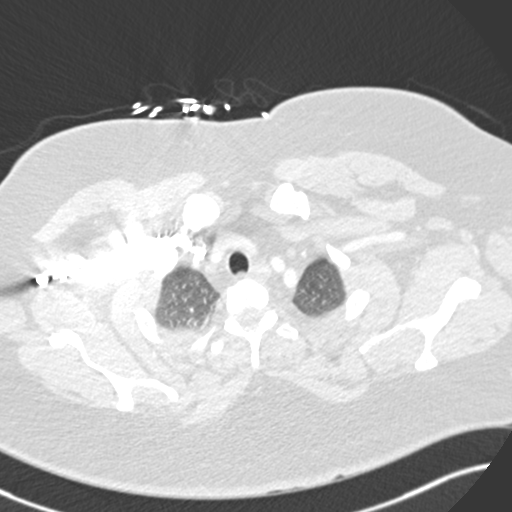
[im 227/261  soft-tissue]
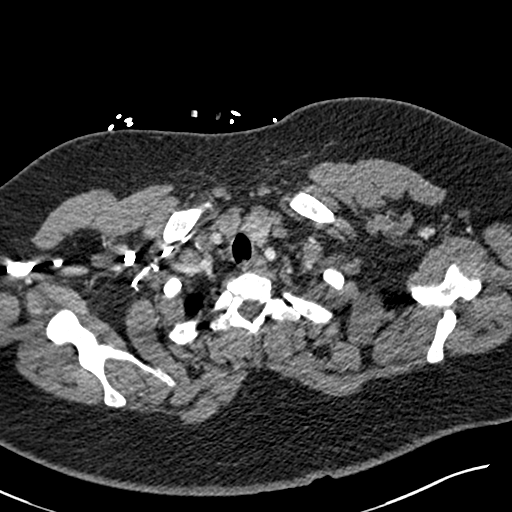
[im 249/261  lung]
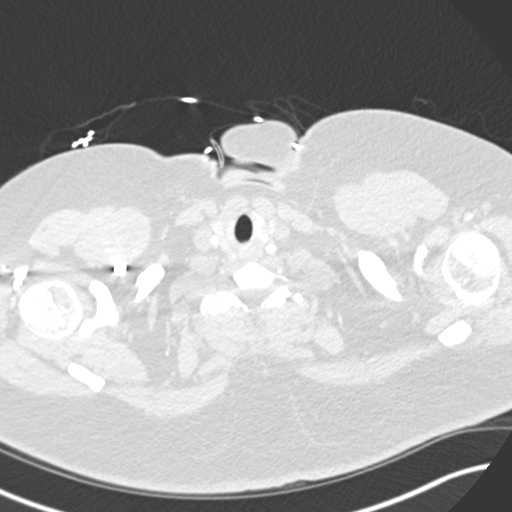

[Series 7: coronal mpr · coronal · 0.61mm/px · 3 of 151 slices shown]
[im 38/151  soft-tissue]
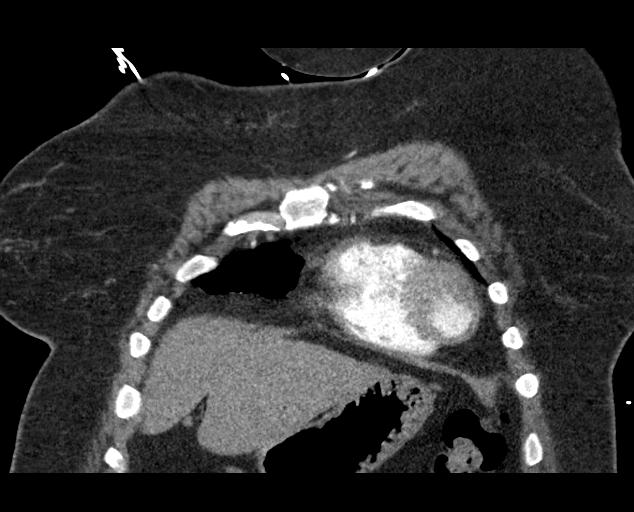
[im 76/151  soft-tissue]
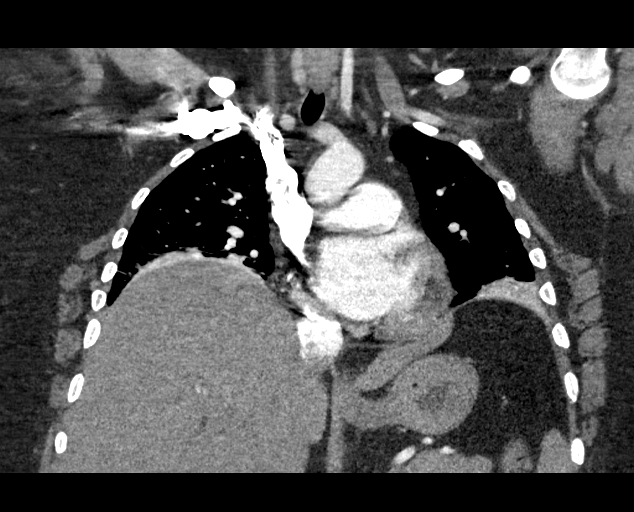
[im 113/151  soft-tissue]
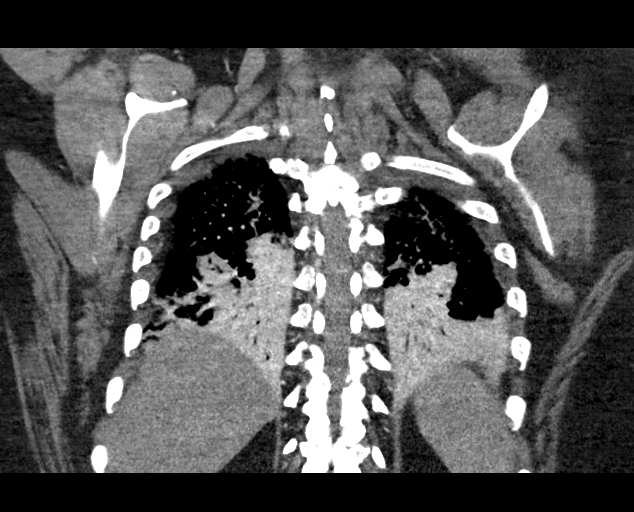

[18 of 46 positions shown; findings below may reference images not displayed]

FINDINGS: Cardiovascular: There is satisfactory opacification of the pulmonary
arteries to the segmental level. There is no evidence of a pulmonary
embolus.

Heart is normal in size and configuration. No pericardial effusion.
No coronary artery calcifications. Great vessels are normal in
caliber. No aortic dissection or atherosclerosis.

Mediastinum/Nodes: No enlarged mediastinal, hilar, or axillary lymph
nodes. Thyroid gland, trachea, and esophagus demonstrate no
significant findings.

Lungs/Pleura: Study appears expiratory. There is significant
atelectasis both lower lobes. Milder atelectasis is noted at the
base of the right middle lobe. Remainder of the lungs is clear. No
pleural effusion. No pneumothorax.

Upper Abdomen: No acute abnormality.

Musculoskeletal: No chest wall abnormality. No acute or significant
osseous findings.

Review of the MIP images confirms the above findings.
IMPRESSION: 1. No evidence of a pulmonary embolism.
2. There is significant lower lobe atelectasis bilaterally. There is
milder right middle lobe atelectasis. Consider a component of
superimposed pneumonia if there are consistent clinical findings. No
evidence of pulmonary edema.

## 2020-02-20 DIAGNOSIS — G4733 Obstructive sleep apnea (adult) (pediatric): Secondary | ICD-10-CM | POA: Diagnosis not present

## 2020-02-22 DIAGNOSIS — J069 Acute upper respiratory infection, unspecified: Secondary | ICD-10-CM | POA: Diagnosis not present

## 2020-02-22 MED FILL — ALBUTEROL SULFATE HFA 108 (: 108 (90 BAS | 16 days supply | Qty: 18 | Fill #1

## 2020-02-23 DIAGNOSIS — J069 Acute upper respiratory infection, unspecified: Secondary | ICD-10-CM | POA: Diagnosis not present

## 2020-03-21 DIAGNOSIS — G4733 Obstructive sleep apnea (adult) (pediatric): Secondary | ICD-10-CM | POA: Diagnosis not present

## 2020-03-26 ENCOUNTER — Other Ambulatory Visit (HOSPITAL_BASED_OUTPATIENT_CLINIC_OR_DEPARTMENT_OTHER): Payer: Self-pay

## 2020-04-03 MED FILL — OZEMPIC (1 MG/DOSE) 4 MG/3M: 4 | 84 days supply | Qty: 9 | Fill #1

## 2020-05-04 DIAGNOSIS — J069 Acute upper respiratory infection, unspecified: Secondary | ICD-10-CM | POA: Diagnosis not present

## 2020-05-04 DIAGNOSIS — J0100 Acute maxillary sinusitis, unspecified: Secondary | ICD-10-CM | POA: Diagnosis not present

## 2020-05-14 ENCOUNTER — Other Ambulatory Visit (HOSPITAL_COMMUNITY): Payer: Self-pay

## 2020-05-14 ENCOUNTER — Other Ambulatory Visit (HOSPITAL_BASED_OUTPATIENT_CLINIC_OR_DEPARTMENT_OTHER): Payer: Self-pay | Admitting: Internal Medicine

## 2020-05-14 ENCOUNTER — Other Ambulatory Visit (HOSPITAL_BASED_OUTPATIENT_CLINIC_OR_DEPARTMENT_OTHER): Payer: Self-pay

## 2020-05-14 MED FILL — Pravastatin Sodium Tab 20 MG: ORAL | 90 days supply | Qty: 90 | Fill #0 | Status: AC

## 2020-05-14 MED FILL — Dapagliflozin Propanediol Tab 10 MG (Base Equivalent): ORAL | 90 days supply | Qty: 90 | Fill #0 | Status: AC

## 2020-05-14 MED FILL — Continuous Glucose System Sensor: 30 days supply | Qty: 3 | Fill #0 | Status: AC

## 2020-05-14 MED FILL — Metformin HCl Tab ER 24HR 500 MG: ORAL | 90 days supply | Qty: 180 | Fill #0 | Status: AC

## 2020-05-15 ENCOUNTER — Other Ambulatory Visit (HOSPITAL_COMMUNITY): Payer: Self-pay

## 2020-05-16 ENCOUNTER — Other Ambulatory Visit (HOSPITAL_COMMUNITY): Payer: Self-pay

## 2020-05-16 ENCOUNTER — Other Ambulatory Visit (HOSPITAL_COMMUNITY): Payer: Self-pay | Admitting: Internal Medicine

## 2020-05-17 ENCOUNTER — Other Ambulatory Visit (HOSPITAL_COMMUNITY): Payer: Self-pay

## 2020-05-22 ENCOUNTER — Other Ambulatory Visit (HOSPITAL_COMMUNITY): Payer: Self-pay

## 2020-05-23 ENCOUNTER — Other Ambulatory Visit (HOSPITAL_COMMUNITY): Payer: Self-pay

## 2020-05-24 ENCOUNTER — Other Ambulatory Visit (HOSPITAL_COMMUNITY): Payer: Self-pay

## 2020-05-25 ENCOUNTER — Other Ambulatory Visit (HOSPITAL_COMMUNITY): Payer: Self-pay

## 2020-05-25 MED ORDER — HYDROCHLOROTHIAZIDE 12.5 MG PO TABS
12.5000 mg | ORAL_TABLET | Freq: Every day | ORAL | 0 refills | Status: DC
  Filled 2020-05-25: qty 90, 90d supply, fill #0

## 2020-05-25 MED ORDER — ALLOPURINOL 300 MG PO TABS
300.0000 mg | ORAL_TABLET | Freq: Every day | ORAL | 0 refills | Status: DC
  Filled 2020-05-25: qty 90, 90d supply, fill #0

## 2020-05-26 ENCOUNTER — Other Ambulatory Visit (HOSPITAL_COMMUNITY): Payer: Self-pay

## 2020-05-28 ENCOUNTER — Other Ambulatory Visit (HOSPITAL_COMMUNITY): Payer: Self-pay

## 2020-06-13 DIAGNOSIS — G4733 Obstructive sleep apnea (adult) (pediatric): Secondary | ICD-10-CM | POA: Diagnosis not present

## 2020-06-21 ENCOUNTER — Other Ambulatory Visit (HOSPITAL_COMMUNITY): Payer: Self-pay

## 2020-06-21 DIAGNOSIS — E119 Type 2 diabetes mellitus without complications: Secondary | ICD-10-CM | POA: Diagnosis not present

## 2020-06-21 DIAGNOSIS — I10 Essential (primary) hypertension: Secondary | ICD-10-CM | POA: Diagnosis not present

## 2020-06-21 DIAGNOSIS — Z8616 Personal history of COVID-19: Secondary | ICD-10-CM | POA: Diagnosis not present

## 2020-06-21 DIAGNOSIS — E1165 Type 2 diabetes mellitus with hyperglycemia: Secondary | ICD-10-CM | POA: Diagnosis not present

## 2020-06-21 DIAGNOSIS — E785 Hyperlipidemia, unspecified: Secondary | ICD-10-CM | POA: Diagnosis not present

## 2020-06-21 DIAGNOSIS — Z6841 Body Mass Index (BMI) 40.0 and over, adult: Secondary | ICD-10-CM | POA: Diagnosis not present

## 2020-06-21 DIAGNOSIS — K7581 Nonalcoholic steatohepatitis (NASH): Secondary | ICD-10-CM | POA: Diagnosis not present

## 2020-06-21 MED ORDER — FARXIGA 10 MG PO TABS
ORAL_TABLET | ORAL | 1 refills | Status: DC
  Filled 2020-06-21 – 2020-09-20 (×3): qty 90, 90d supply, fill #0
  Filled 2020-12-18 – 2021-03-18 (×2): qty 90, 90d supply, fill #1

## 2020-06-21 MED ORDER — OZEMPIC (1 MG/DOSE) 4 MG/3ML ~~LOC~~ SOPN
PEN_INJECTOR | SUBCUTANEOUS | 2 refills | Status: DC
Start: 2020-06-21 — End: 2021-01-31
  Filled 2020-06-21: qty 3, 30d supply, fill #0
  Filled 2020-08-13: qty 3, 28d supply, fill #1
  Filled 2020-10-29: qty 3, 28d supply, fill #2

## 2020-06-21 MED ORDER — METFORMIN HCL ER 500 MG PO TB24
ORAL_TABLET | ORAL | 3 refills | Status: DC
Start: 2020-06-21 — End: 2022-04-25
  Filled 2020-06-21 – 2020-09-20 (×2): qty 180, 90d supply, fill #0
  Filled 2021-03-18: qty 180, 90d supply, fill #1

## 2020-06-22 ENCOUNTER — Other Ambulatory Visit (HOSPITAL_COMMUNITY): Payer: Self-pay

## 2020-06-22 MED ORDER — PANTOPRAZOLE SODIUM 40 MG PO TBEC
DELAYED_RELEASE_TABLET | ORAL | 0 refills | Status: DC
  Filled 2020-06-22: qty 90, 90d supply, fill #0

## 2020-06-22 MED FILL — Continuous Glucose System Sensor: 30 days supply | Qty: 3 | Fill #1 | Status: AC

## 2020-06-25 ENCOUNTER — Encounter: Payer: Self-pay | Admitting: Internal Medicine

## 2020-06-25 ENCOUNTER — Other Ambulatory Visit (HOSPITAL_COMMUNITY): Payer: Self-pay

## 2020-06-28 ENCOUNTER — Other Ambulatory Visit: Payer: Self-pay

## 2020-06-28 ENCOUNTER — Other Ambulatory Visit (HOSPITAL_COMMUNITY): Payer: Self-pay

## 2020-06-28 ENCOUNTER — Ambulatory Visit
Admission: EM | Admit: 2020-06-28 | Discharge: 2020-06-28 | Disposition: A | Discharge status: Home / Self Care | Payer: 59 | Attending: Emergency Medicine | Admitting: Emergency Medicine

## 2020-06-28 DIAGNOSIS — Z7689 Persons encountering health services in other specified circumstances: Secondary | ICD-10-CM | POA: Diagnosis not present

## 2020-06-28 DIAGNOSIS — J069 Acute upper respiratory infection, unspecified: Secondary | ICD-10-CM

## 2020-06-28 MED ORDER — BENZONATATE 100 MG PO CAPS: 100.0000 mg | ORAL_CAPSULE | Freq: Three times a day (TID) | ORAL | 0 refills | Status: DC

## 2020-06-28 MED ORDER — CETIRIZINE-PSEUDOEPHEDRINE ER 5-120 MG PO TB12: 1.0000 | ORAL_TABLET | Freq: Every day | ORAL | 0 refills | Status: DC

## 2020-06-28 NOTE — ED Triage Notes (Signed)
Triaged by provider  

## 2020-06-28 NOTE — Discharge Instructions (Addendum)
COVID testing ordered.  It will take between 5-7 days for test results.  Someone will contact you regarding abnormal results.    In the meantime: You should remain isolated in your home for 5 days from symptom onset AND greater than 72 hours after symptoms resolution (absence of fever without the use of fever-reducing medication and improvement in respiratory symptoms), whichever is longer Get plenty of rest and push fluids Tessalon Perles prescribed for cough Zyrtec D for congestion and runny nose Use medications daily for symptom relief Use OTC medications like ibuprofen or tylenol as needed fever or pain Call or go to the ED if you have any new or worsening symptoms such as fever, worsening cough, shortness of breath, chest tightness, chest pain, turning blue, changes in mental status, etc..Marland Kitchen

## 2020-06-28 NOTE — ED Provider Notes (Signed)
[place]   [id number] 06/28/20 Arrival Time: 7628   CC: COVID symptoms  SUBJECTIVE: History from: patient.  Cheryl Black is a 35 y.o. female who presents with sore throat, nasal congestion, cough x 1-2 days.  Denies sick exposure to COVID, flu or strep.  However, works in the ED.  Has tried OTC medications without relief.  Denies aggravatign factors.  Reports previous covid infection in the past.    Denies fever, chills, SOB, wheezing, chest pain, nausea, changes in bowel or bladder habits.    ROS: As per HPI.  All other pertinent ROS negative.     Past Medical History:  Diagnosis Date   Acid reflux    Asthma    Clostridium difficile infection    in the past   Diabetes mellitus    Fatty liver    Fibrocystic breast disease    Gout    Hidradenitis    History of kidney stones    Hyperlipemia    Obesity    Polycystic ovary syndrome    PONV (postoperative nausea and vomiting)    Sleep apnea    Smoker    Past Surgical History:  Procedure Laterality Date   CHOLECYSTECTOMY  01/10/2011   Procedure: LAPAROSCOPIC CHOLECYSTECTOMY;  Surgeon: Jamesetta So;  Location: [facility];  Service: General;  Laterality: N/A;   ESOPHAGOGASTRODUODENOSCOPY (EGD) WITH PROPOFOL N/A 09/09/2018   RMR: Mild erosive reflux esophagitis.  Esophagus dilated for history of dysphagia.   HYDRADENITIS EXCISION Right 06/18/2012   Procedure: EXCISION HYDRIADENITIS SUPRATIVA  RIGHT AXILLA;  Surgeon: Scherry Ran, MD;  Location: [facility];  Service: General;  Laterality: Right;   LIVER BIOPSY  01/10/2011   Procedure: LIVER BIOPSY;  Surgeon: Jamesetta So;  Location: [facility];  Service: General;;   MALONEY DILATION N/A 09/09/2018   Procedure: Venia Minks DILATION;  Surgeon: Daneil Dolin, MD;  Location: [facility];  Service: Endoscopy;  Laterality: N/A;   SKIN SPLIT GRAFT Right 06/18/2012   Procedure: SKIN GRAFT SPLIT THICKNESS RIGHT AXILLA(WITH TWO STANDARD SKIN BOARDS 3"x 8" )  DONOR SITE RIGHT AND LEFT  THIGHS;  Surgeon: Scherry Ran, MD;  Location: [facility];  Service: General;  Laterality: Right;   Allergies  Allergen Reactions   Ace Inhibitors Anaphylaxis   Beta Adrenergic Blockers Other (See Comments)    Angioedema    Augmentin [Amoxicillin-Pot Clavulanate] Other (See Comments)    Has had C.diff in the past Did it involve swelling of the face/tongue/throat, SOB, or low BP? No Did it involve sudden or severe rash/hives, skin peeling, or any reaction on the inside of your mouth or nose? No Did you need to seek medical attention at a hospital or doctor's office? No When did it last happen?Unknown If all above answers are "NO", may proceed with cephalosporin use.    Benicar [Olmesartan] Other (See Comments)    Facial edema   Doxycycline Other (See Comments)    Makes feet and hands red   Eggs Or Egg-Derived Products     Local reaction with injectable meds that contain eggs   Influenza Vaccine Live Swelling    Arm    Invokana [Canagliflozin] Other (See Comments)    Dehydration    No current facility-administered medications on file prior to encounter.   Current Outpatient Medications on File Prior to Encounter  Medication Sig Dispense Refill   acetaminophen (TYLENOL) 500 MG tablet Take 1,000 mg by mouth every 6 (six) hours as needed (for pain/headaches.).  albuterol (VENTOLIN HFA) 108 (90 Base) MCG/ACT inhaler INHALE 2 PUFFS BY MOUTH EVERY 4 TO 6 HOURS AS NEEDED FOR SHORTNESS OF BREATH 18 g 3   allopurinol (ZYLOPRIM) 300 MG tablet Take 300 mg by mouth daily.      allopurinol (ZYLOPRIM) 300 MG tablet TAKE 1 TABLET BY MOUTH ONCE DAILY 90 tablet 0   butalbital-acetaminophen-caffeine (FIORICET, ESGIC) 50-325-40 MG tablet Take 1 tablet by mouth daily as needed (headaches.).  (Patient not taking: Reported on 08/05/2019)     calcium carbonate (TUMS CHEWY BITES) 750 MG chewable tablet Chew 1 tablet by mouth as needed for heartburn.     Continuous Blood Gluc Sensor (DEXCOM G6  SENSOR) MISC CHANGE SENSOR EVERY 10 DAYS. 3 each 6   dapagliflozin propanediol (FARXIGA) 10 MG TABS tablet TAKE 1 TABLET BY MOUTH ONCE DAILY 30 tablet 0   dapagliflozin propanediol (FARXIGA) 10 MG TABS tablet Take 1 tablet by mouth once daily 90 tablet 1   dapagliflozin propanediol (FARXIGA) 5 MG TABS tablet Take 10 mg by mouth daily.      famotidine (PEPCID) 40 MG tablet Take 1 tablet (40 mg total) by mouth daily. 90 tablet 3   fluticasone (FLONASE) 50 MCG/ACT nasal spray Place 2 sprays into both nostrils daily.      hydrochlorothiazide (HYDRODIURIL) 12.5 MG tablet TAKE 1 TABLET BY MOUTH DAILY FOR HIGH BLOOD PRESSURE 90 tablet 0   hydrochlorothiazide (MICROZIDE) 12.5 MG capsule Take 1 capsule (12.5 mg total) by mouth daily. 30 capsule 3   medroxyPROGESTERone (PROVERA) 10 MG tablet Take 1 tablet (10 mg total) by mouth daily. The first 10 days of each calendar month (Patient not taking: Reported on 08/05/2019) 10 tablet 11   megestrol (MEGACE) 40 MG tablet TAKE 3 TABLETS BY MOUTH DAILY FOR 5 DAYS, 2 TABLETS DAILY FOR 5 DAYS THEN 1 TABLET DAILY (Patient not taking: Reported on 08/05/2019) 45 tablet 1   megestrol (MEGACE) 40 MG tablet TAKE 3 TABLETS BY MOUTH DAILY FOR 5 DAYS, 2 TABLETS DAILY FOR 5 DAYS THEN 1 TABLET DAILY 45 tablet 1   metFORMIN (GLUCOPHAGE) 500 MG tablet Take by mouth 2 (two) times daily with a meal.     metFORMIN (GLUCOPHAGE-XR) 500 MG 24 hr tablet TAKE 2 TABLETS BY MOUTH ONCE DAILY 180 tablet 3   metFORMIN (GLUCOPHAGE-XR) 500 MG 24 hr tablet Take 2 tablets by mouth once daily 180 tablet 3   OZEMPIC, 0.25 OR 0.5 MG/DOSE, 2 MG/1.5ML SOPN Inject 0.5 mg into the skin once a week.     pantoprazole (PROTONIX) 40 MG tablet Take 40 mg by mouth 2 (two) times daily.     pantoprazole (PROTONIX) 40 MG tablet Take 1 tablet by mouth daily 90 tablet 0   pravastatin (PRAVACHOL) 20 MG tablet Take 1 tablet by mouth daily.     pravastatin (PRAVACHOL) 20 MG tablet TAKE 1 TABLET BY MOUTH NIGHTLY FOR  CHOLESTROL 90 tablet 2   progesterone (PROMETRIUM) 200 MG capsule Take 2 capsules (400 mg total) by mouth daily. 60 capsule 0   Semaglutide, 1 MG/DOSE, (OZEMPIC, 1 MG/DOSE,) 4 MG/3ML SOPN Inject as directed under the skin once weekly 9 mL 2   Semaglutide, 1 MG/DOSE, 4 MG/3ML SOPN INJECT UNDER THE SKIN ONCE A WEEK 3 mL 6   Social History   Socioeconomic History   Marital status: Married    Spouse name: Not on file   Number of children: 0   Years of education: Not on file   Highest education  level: Not on file  Occupational History   Occupation: nurse  Tobacco Use   Smoking status: Former    Packs/day: 1.00    Years: 10.00    Pack years: 10.00    Types: Cigarettes    Quit date: 08/23/2017    Years since quitting: 2.8   Smokeless tobacco: Never  Vaping Use   Vaping Use: Never used  Substance and Sexual Activity   Alcohol use: Yes    Comment: occas   Drug use: No   Sexual activity: Yes    Birth control/protection: None  Other Topics Concern   Not on file  Social History Narrative   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   Social Determinants of Health   Financial Resource Strain: Not on file  Food Insecurity: Not on file  Transportation Needs: Not on file  Physical Activity: Not on file  Stress: Not on file  Social Connections: Not on file  Intimate Partner Violence: Not on file   Family History  Problem Relation Age of Onset   Stroke Other        paternal great grandfather   Diabetes Father    Hypertension Father    Cancer Maternal Grandfather        adenocarcinoma   Emphysema Maternal Grandfather        smoked   Heart attack Paternal Grandfather    Hypertension Paternal Grandmother    Diabetes Paternal Grandmother    Gout Paternal Grandmother    Hypertension Maternal Grandmother    Other Mother        pre-cancerous cells; had hyst   Emphysema Mother        smoker   Gout Brother    Cancer Sister        cervical; had hyst   Asthma Maternal Uncle    Colon cancer Neg  Hx    Liver disease Neg Hx    Liver cancer Neg Hx     OBJECTIVE:  Vitals:   06/28/20 1513  BP: 114/76  Pulse: 76  Resp: 18  Temp: 97.9 F (36.6 C)  TempSrc: Tympanic  SpO2: 95%     General appearance: alert; appears fatigued, but nontoxic; speaking in full sentences and tolerating own secretions HEENT: NCAT; Ears: EACs clear, TMs pearly gray; Eyes: PERRL.  EOM grossly intact. Nose: nares patent without rhinorrhea, Throat: oropharynx clear, tonsils non erythematous or enlarged, uvula midline  Neck: supple without LAD Lungs: unlabored respirations, symmetrical air entry; cough: absent; no respiratory distress; CTAB Heart: regular rate and rhythm.   Skin: warm and dry Psychological: alert and cooperative; normal mood and affect  ASSESSMENT & PLAN:  1. Viral URI with cough     Meds ordered this encounter  Medications   cetirizine-pseudoephedrine (ZYRTEC-D) 5-120 MG tablet    Sig: Take 1 tablet by mouth daily.    Dispense:  30 tablet    Refill:  0    Order Specific Question:   Supervising Provider    Answer:   Raylene Everts [[id number]]   benzonatate (TESSALON) 100 MG capsule    Sig: Take 1 capsule (100 mg total) by mouth every 8 (eight) hours.    Dispense:  21 capsule    Refill:  0    Order Specific Question:   Supervising Provider    Answer:   Raylene Everts [[id number]]    COVID testing ordered.  It will take between 5-7 days for test results.  Someone will contact you regarding abnormal results.  In the meantime: You should remain isolated in your home for 5 days from symptom onset AND greater than 72 hours after symptoms resolution (absence of fever without the use of fever-reducing medication and improvement in respiratory symptoms), whichever is longer Get plenty of rest and push fluids Tessalon Perles prescribed for cough Zyrtec D for congestion and runny nose Use medications daily for symptom relief Use OTC medications like ibuprofen or tylenol as  needed fever or pain Call or go to the ED if you have any new or worsening symptoms such as fever, worsening cough, shortness of breath, chest tightness, chest pain, turning blue, changes in mental status, etc...   Reviewed expectations re: course of current medical issues. Questions answered. Outlined signs and symptoms indicating need for more acute intervention. Patient verbalized understanding. After Visit Summary given.          Lestine Box, PA-C 06/28/20 1534

## 2020-06-29 LAB — COVID-19, FLU A+B NAA
Influenza A, NAA: NOT DETECTED
Influenza B, NAA: NOT DETECTED
SARS-CoV-2, NAA: DETECTED — AB

## 2020-07-02 DIAGNOSIS — U071 COVID-19: Secondary | ICD-10-CM | POA: Diagnosis not present

## 2020-07-06 DIAGNOSIS — E1165 Type 2 diabetes mellitus with hyperglycemia: Secondary | ICD-10-CM | POA: Diagnosis not present

## 2020-07-06 DIAGNOSIS — I10 Essential (primary) hypertension: Secondary | ICD-10-CM | POA: Diagnosis not present

## 2020-07-06 DIAGNOSIS — E782 Mixed hyperlipidemia: Secondary | ICD-10-CM | POA: Diagnosis not present

## 2020-07-10 ENCOUNTER — Other Ambulatory Visit (HOSPITAL_COMMUNITY): Payer: Self-pay

## 2020-07-12 ENCOUNTER — Other Ambulatory Visit (HOSPITAL_COMMUNITY): Payer: Self-pay

## 2020-07-13 ENCOUNTER — Other Ambulatory Visit (HOSPITAL_COMMUNITY): Payer: Self-pay

## 2020-07-13 DIAGNOSIS — E782 Mixed hyperlipidemia: Secondary | ICD-10-CM | POA: Diagnosis not present

## 2020-07-13 DIAGNOSIS — U071 COVID-19: Secondary | ICD-10-CM | POA: Diagnosis not present

## 2020-07-13 DIAGNOSIS — K219 Gastro-esophageal reflux disease without esophagitis: Secondary | ICD-10-CM | POA: Diagnosis not present

## 2020-07-13 DIAGNOSIS — I10 Essential (primary) hypertension: Secondary | ICD-10-CM | POA: Diagnosis not present

## 2020-07-13 DIAGNOSIS — E1165 Type 2 diabetes mellitus with hyperglycemia: Secondary | ICD-10-CM | POA: Diagnosis not present

## 2020-07-13 DIAGNOSIS — Z8616 Personal history of COVID-19: Secondary | ICD-10-CM | POA: Diagnosis not present

## 2020-07-14 ENCOUNTER — Other Ambulatory Visit (HOSPITAL_COMMUNITY): Payer: Self-pay

## 2020-07-14 MED ORDER — PANTOPRAZOLE SODIUM 40 MG PO TBEC
40.0000 mg | DELAYED_RELEASE_TABLET | Freq: Every day | ORAL | 0 refills | Status: DC
  Filled 2020-07-14 – 2020-09-20 (×2): qty 90, 90d supply, fill #0

## 2020-07-16 ENCOUNTER — Other Ambulatory Visit (HOSPITAL_COMMUNITY): Payer: Self-pay

## 2020-07-17 ENCOUNTER — Other Ambulatory Visit (HOSPITAL_COMMUNITY): Payer: Self-pay

## 2020-07-19 ENCOUNTER — Other Ambulatory Visit (HOSPITAL_COMMUNITY): Payer: Self-pay

## 2020-07-24 ENCOUNTER — Other Ambulatory Visit (HOSPITAL_COMMUNITY): Payer: Self-pay

## 2020-07-26 ENCOUNTER — Other Ambulatory Visit (HOSPITAL_COMMUNITY): Payer: Self-pay

## 2020-07-27 ENCOUNTER — Other Ambulatory Visit (HOSPITAL_COMMUNITY): Payer: Self-pay

## 2020-07-30 ENCOUNTER — Other Ambulatory Visit (HOSPITAL_COMMUNITY): Payer: Self-pay

## 2020-08-01 ENCOUNTER — Other Ambulatory Visit (HOSPITAL_COMMUNITY): Payer: Self-pay

## 2020-08-03 ENCOUNTER — Other Ambulatory Visit (HOSPITAL_COMMUNITY): Payer: Self-pay

## 2020-08-06 ENCOUNTER — Other Ambulatory Visit (HOSPITAL_COMMUNITY): Payer: Self-pay

## 2020-08-08 ENCOUNTER — Other Ambulatory Visit (HOSPITAL_COMMUNITY): Payer: Self-pay

## 2020-08-13 ENCOUNTER — Other Ambulatory Visit (HOSPITAL_COMMUNITY): Payer: Self-pay

## 2020-08-31 ENCOUNTER — Other Ambulatory Visit (HOSPITAL_COMMUNITY): Payer: Self-pay

## 2020-08-31 MED ORDER — DEXCOM G6 TRANSMITTER MISC
3 refills | Status: DC
  Filled 2020-08-31: qty 1, 90d supply, fill #0
  Filled 2020-12-18: qty 1, 90d supply, fill #1

## 2020-09-12 DIAGNOSIS — G4733 Obstructive sleep apnea (adult) (pediatric): Secondary | ICD-10-CM | POA: Diagnosis not present

## 2020-09-20 ENCOUNTER — Other Ambulatory Visit (HOSPITAL_COMMUNITY): Payer: Self-pay

## 2020-09-20 MED ORDER — ALLOPURINOL 300 MG PO TABS
300.0000 mg | ORAL_TABLET | Freq: Every day | ORAL | 0 refills | Status: DC
  Filled 2020-09-20: qty 90, 90d supply, fill #0

## 2020-09-20 MED ORDER — HYDROCHLOROTHIAZIDE 12.5 MG PO TABS
12.5000 mg | ORAL_TABLET | Freq: Every day | ORAL | 0 refills | Status: DC
  Filled 2020-12-18: qty 90, 90d supply, fill #0

## 2020-09-20 MED ORDER — ALLOPURINOL 300 MG PO TABS: 300.0000 mg | ORAL_TABLET | Freq: Every day | ORAL | 0 refills | Status: DC

## 2020-09-20 MED ORDER — HYDROCHLOROTHIAZIDE 12.5 MG PO TABS
12.5000 mg | ORAL_TABLET | Freq: Every day | ORAL | 0 refills | Status: DC
  Filled 2020-09-20: qty 90, 90d supply, fill #0

## 2020-09-20 MED FILL — Continuous Glucose System Sensor: 30 days supply | Qty: 3 | Fill #2 | Status: AC

## 2020-09-20 MED FILL — Semaglutide Soln Pen-inj 1 MG/DOSE (4 MG/3ML): SUBCUTANEOUS | 28 days supply | Qty: 3 | Fill #0 | Status: AC

## 2020-09-20 MED FILL — Pravastatin Sodium Tab 20 MG: ORAL | 90 days supply | Qty: 90 | Fill #1 | Status: AC

## 2020-09-21 ENCOUNTER — Other Ambulatory Visit (HOSPITAL_COMMUNITY): Payer: Self-pay

## 2020-09-24 ENCOUNTER — Other Ambulatory Visit (HOSPITAL_COMMUNITY): Payer: Self-pay

## 2020-10-04 ENCOUNTER — Other Ambulatory Visit: Payer: Self-pay

## 2020-10-04 ENCOUNTER — Encounter: Payer: Self-pay | Admitting: Adult Health

## 2020-10-04 ENCOUNTER — Ambulatory Visit (INDEPENDENT_AMBULATORY_CARE_PROVIDER_SITE_OTHER): Payer: 59 | Admitting: Adult Health

## 2020-10-04 ENCOUNTER — Other Ambulatory Visit (HOSPITAL_COMMUNITY)
Admission: RE | Admit: 2020-10-04 | Discharge: 2020-10-04 | Disposition: A | Discharge status: Home / Self Care | Payer: 59 | Source: Ambulatory Visit | Attending: Adult Health | Admitting: Adult Health

## 2020-10-04 VITALS — BP 120/84 | HR 68 | Ht 69.0 in | Wt 276.0 lb

## 2020-10-04 DIAGNOSIS — N938 Other specified abnormal uterine and vaginal bleeding: Secondary | ICD-10-CM | POA: Diagnosis not present

## 2020-10-04 DIAGNOSIS — Z01419 Encounter for gynecological examination (general) (routine) without abnormal findings: Secondary | ICD-10-CM | POA: Insufficient documentation

## 2020-10-04 DIAGNOSIS — E282 Polycystic ovarian syndrome: Secondary | ICD-10-CM | POA: Diagnosis not present

## 2020-10-04 DIAGNOSIS — Z30432 Encounter for removal of intrauterine contraceptive device: Secondary | ICD-10-CM | POA: Insufficient documentation

## 2020-10-04 NOTE — Addendum Note (Signed)
Addended by: Janece Canterbury on: 10/04/2020 04:54 PM   Modules accepted: Orders

## 2020-10-04 NOTE — Progress Notes (Signed)
Patient ID: Cheryl Black, female   DOB: 1985/09/28, 35 y.o.   MRN: [id number] History of Present Illness: Cheryl Black is a 35 year old white female,married, G0P0, in for a well woman gyn exam and pap, and wants IUD removed, it was placed in July 2021, and has not helped with bleeding. PCP is Dr Nevada Crane.  Lab Results  Component Value Date   DIAGPAP  03/03/2016    NEGATIVE FOR INTRAEPITHELIAL LESIONS OR MALIGNANCY.   HPV NOT DETECTED 03/03/2016    Current Medications, Allergies, Past Medical History, Past Surgical History, Family History and Social History were reviewed in [employer] record.     Review of Systems: Patient denies any headaches, hearing loss, fatigue, blurred vision, shortness of breath, chest pain, abdominal pain, problems with bowel movements, urination, or intercourse. No joint pain or mood swings.  +Bleeding    Physical Exam:BP 120/84 (BP Location: Left Arm, Patient Position: Sitting, Cuff Size: Normal)   Pulse 68   Ht 5' 9"  (1.753 m)   Wt 276 lb (125.2 kg)   LMP 10/02/2020   BMI 40.76 kg/m   General:  Well developed, well nourished, no acute distress Skin:  Warm and dry Neck:  Midline trachea, normal thyroid, good ROM, no lymphadenopathy Lungs; Clear to auscultation bilaterally Breast:  No dominant palpable mass, retraction, or nipple discharge Cardiovascular: Regular rate and rhythm Abdomen:  Soft, non tender, no hepatosplenomegaly Pelvic:  External genitalia is normal in appearance, no lesions.Has skin tag left inner thigh.  The vagina is normal in appearance.+period blood, dark Urethra has no lesions or masses. The cervix is smooth, pap with HR HPV genotyping performed, IUD strings seen and grasped with forceps and pt asked to cough and IUD easily removed.  Uterus is felt to be normal size, shape, and contour.  No adnexal masses or tenderness noted.Bladder is non tender, no masses felt. Extremities/musculoskeletal:  No swelling or varicosities  noted, no clubbing or cyanosis Psych:  No mood changes, alert and cooperative,seems happy AA is 0 Fall risk is low Depression screen [facility] 2/9 10/04/2020 03/03/2016  Decreased Interest 0 0  Down, Depressed, Hopeless 0 0  PHQ - 2 Score 0 0  Altered sleeping 0 -  Tired, decreased energy 0 -  Change in appetite 0 -  Feeling bad or failure about yourself  0 -  Trouble concentrating 0 -  Moving slowly or fidgety/restless 0 -  Suicidal thoughts 0 -  PHQ-9 Score 0 -    GAD 7 : Generalized Anxiety Score 10/04/2020  Nervous, Anxious, on Edge 0  Control/stop worrying 0  Worry too much - different things 0  Trouble relaxing 0  Restless 0  Easily annoyed or irritable 0  Afraid - awful might happen 0  Total GAD 7 Score 0      Upstream - 10/04/20 1554       Pregnancy Intention Screening   Does the patient want to become pregnant in the next year? No    Does the patient's partner want to become pregnant in the next year? No    Would the patient like to discuss contraceptive options today? Yes      Contraception Wrap Up   Current Method IUD or IUS    End Method No Method - Other Reason    Contraception Counseling Provided Yes            Examination chaperoned by Tish RN.   Impression and Plan: 1. Encounter for gynecological examination with Papanicolaou  smear of cervix Pap sent Physical in 1 year Pap in 3 if normal Labs with PCP and endocrinologist  Mammogram at 39  2. Encounter for IUD removal   3. DUB (dysfunctional uterine bleeding) Will see how bleeding is with IUD, given handout on endometrial ablation  4. PCOS (polycystic ovarian syndrome)

## 2020-10-09 LAB — CYTOLOGY - PAP
Comment: NEGATIVE
Diagnosis: NEGATIVE
High risk HPV: NEGATIVE

## 2020-10-29 ENCOUNTER — Other Ambulatory Visit (HOSPITAL_COMMUNITY): Payer: Self-pay

## 2020-10-29 MED FILL — Continuous Glucose System Sensor: 30 days supply | Qty: 3 | Fill #3 | Status: AC

## 2020-11-01 ENCOUNTER — Encounter: Payer: Self-pay | Admitting: Adult Health

## 2020-11-01 ENCOUNTER — Ambulatory Visit: Payer: 59 | Admitting: Adult Health

## 2020-11-01 ENCOUNTER — Other Ambulatory Visit: Payer: Self-pay

## 2020-11-01 VITALS — BP 120/76 | HR 93 | Ht 69.0 in | Wt 275.0 lb

## 2020-11-01 DIAGNOSIS — N938 Other specified abnormal uterine and vaginal bleeding: Secondary | ICD-10-CM

## 2020-11-01 NOTE — Progress Notes (Signed)
  Subjective:     Patient ID: Cheryl Black, female   DOB: 1985/06/28, 35 y.o.   MRN: [id number]  HPI Cheryl Black is a 35 year old white female, married, G0P0, back in follow up on bleeding after removing IUD and bleeding stopped.  PCP is Dr Nevada Crane. Lab Results  Component Value Date   DIAGPAP  10/04/2020    - Negative for intraepithelial lesion or malignancy (NILM)   HPV NOT DETECTED 03/03/2016   [place] Negative 10/04/2020    Review of Systems No bleeding since IUD removed Reviewed past medical,surgical, social and family history. Reviewed medications and allergies.     Objective:   Physical Exam BP 120/76 (BP Location: Right Arm, Patient Position: Sitting, Cuff Size: Normal)   Pulse 93   Ht 5' 9"  (1.753 m)   Wt 275 lb (124.7 kg)   LMP 10/02/2020   BMI 40.61 kg/m     Skin warm and dry. Lungs: clear to ausculation bilaterally. Cardiovascular: regular rate and rhythm. Fall risk is low Depression screen [facility] 2/9 11/01/2020 10/04/2020 03/03/2016  Decreased Interest 0 0 0  Down, Depressed, Hopeless 0 0 0  PHQ - 2 Score 0 0 0  Altered sleeping - 0 -  Tired, decreased energy - 0 -  Change in appetite - 0 -  Feeling bad or failure about yourself  - 0 -  Trouble concentrating - 0 -  Moving slowly or fidgety/restless - 0 -  Suicidal thoughts - 0 -  PHQ-9 Score - 0 -     Upstream - 11/01/20 1521       Pregnancy Intention Screening   Does the patient want to become pregnant in the next year? Unsure    Does the patient's partner want to become pregnant in the next year? No    Would the patient like to discuss contraceptive options today? No      Contraception Wrap Up   Current Method No Method - Other Reason    End Method Female Condom    Contraception Counseling Provided No             Assessment:     1. DUB (dysfunctional uterine bleeding),resolved  Plan:    Do not go over 3 months without a period  Follow up in 3 months

## 2020-11-02 DIAGNOSIS — E1165 Type 2 diabetes mellitus with hyperglycemia: Secondary | ICD-10-CM | POA: Diagnosis not present

## 2020-11-02 DIAGNOSIS — I10 Essential (primary) hypertension: Secondary | ICD-10-CM | POA: Diagnosis not present

## 2020-11-12 ENCOUNTER — Other Ambulatory Visit (HOSPITAL_COMMUNITY): Payer: Self-pay

## 2020-11-12 DIAGNOSIS — E782 Mixed hyperlipidemia: Secondary | ICD-10-CM | POA: Diagnosis not present

## 2020-11-12 DIAGNOSIS — R0600 Dyspnea, unspecified: Secondary | ICD-10-CM | POA: Diagnosis not present

## 2020-11-12 DIAGNOSIS — J302 Other seasonal allergic rhinitis: Secondary | ICD-10-CM | POA: Diagnosis not present

## 2020-11-12 DIAGNOSIS — K219 Gastro-esophageal reflux disease without esophagitis: Secondary | ICD-10-CM | POA: Diagnosis not present

## 2020-11-12 DIAGNOSIS — D751 Secondary polycythemia: Secondary | ICD-10-CM | POA: Diagnosis not present

## 2020-11-12 DIAGNOSIS — M109 Gout, unspecified: Secondary | ICD-10-CM | POA: Diagnosis not present

## 2020-11-12 DIAGNOSIS — I10 Essential (primary) hypertension: Secondary | ICD-10-CM | POA: Diagnosis not present

## 2020-11-12 DIAGNOSIS — E1165 Type 2 diabetes mellitus with hyperglycemia: Secondary | ICD-10-CM | POA: Diagnosis not present

## 2020-11-12 DIAGNOSIS — R7401 Elevation of levels of liver transaminase levels: Secondary | ICD-10-CM | POA: Diagnosis not present

## 2020-11-12 MED ORDER — OZEMPIC (1 MG/DOSE) 4 MG/3ML ~~LOC~~ SOPN
PEN_INJECTOR | SUBCUTANEOUS | 2 refills | Status: DC
Start: 2020-11-12 — End: 2021-03-15
  Filled 2020-11-12: qty 3, 28d supply, fill #0
  Filled 2020-11-20 – 2020-12-18 (×2): qty 3, 28d supply, fill #1
  Filled 2021-02-06 (×3): qty 3, 28d supply, fill #2

## 2020-11-12 MED ORDER — FEXOFENADINE HCL 180 MG PO TABS
ORAL_TABLET | ORAL | 2 refills | Status: DC
Start: 2020-11-12 — End: 2022-04-15
  Filled 2020-11-12 – 2020-12-18 (×2): qty 90, 90d supply, fill #0

## 2020-11-12 MED ORDER — HYDROCHLOROTHIAZIDE 12.5 MG PO TABS
12.5000 mg | ORAL_TABLET | Freq: Every day | ORAL | 2 refills | Status: DC
  Filled 2020-11-12 – 2021-03-18 (×3): qty 90, 90d supply, fill #0

## 2020-11-12 MED ORDER — METFORMIN HCL ER 500 MG PO TB24
1000.0000 mg | ORAL_TABLET | Freq: Every day | ORAL | 2 refills | Status: DC
  Filled 2020-11-12 – 2020-12-18 (×2): qty 180, 90d supply, fill #0

## 2020-11-12 MED ORDER — ALLOPURINOL 300 MG PO TABS
300.0000 mg | ORAL_TABLET | Freq: Every day | ORAL | 2 refills | Status: DC
  Filled 2020-11-12 – 2020-12-18 (×2): qty 90, 90d supply, fill #0
  Filled 2021-03-18: qty 90, 90d supply, fill #1

## 2020-11-12 MED ORDER — ALBUTEROL SULFATE HFA 108 (90 BASE) MCG/ACT IN AERS
INHALATION_SPRAY | RESPIRATORY_TRACT | 2 refills | Status: DC
  Filled 2020-11-12: qty 18, 30d supply, fill #0
  Filled 2021-07-17: qty 18, 16d supply, fill #1
  Filled 2021-08-02: qty 6.7, 16d supply, fill #2

## 2020-11-12 MED ORDER — PANTOPRAZOLE SODIUM 40 MG PO TBEC
DELAYED_RELEASE_TABLET | ORAL | 2 refills | Status: DC
  Filled 2020-11-12 – 2020-12-18 (×2): qty 180, 90d supply, fill #0
  Filled 2021-03-18: qty 180, 90d supply, fill #1

## 2020-11-12 MED ORDER — PRAVASTATIN SODIUM 20 MG PO TABS
ORAL_TABLET | ORAL | 2 refills | Status: DC
  Filled 2020-11-12 – 2021-03-18 (×2): qty 90, 90d supply, fill #0

## 2020-11-12 MED ORDER — FARXIGA 10 MG PO TABS
ORAL_TABLET | ORAL | 2 refills | Status: DC
  Filled 2020-11-12 – 2020-12-18 (×2): qty 90, 90d supply, fill #0

## 2020-11-20 ENCOUNTER — Other Ambulatory Visit (HOSPITAL_COMMUNITY): Payer: Self-pay

## 2020-12-18 ENCOUNTER — Other Ambulatory Visit (HOSPITAL_COMMUNITY): Payer: Self-pay

## 2020-12-19 ENCOUNTER — Other Ambulatory Visit (HOSPITAL_COMMUNITY): Payer: Self-pay

## 2020-12-19 MED ORDER — DEXCOM G6 TRANSMITTER MISC
6 refills | Status: DC
  Filled 2020-12-19 – 2021-08-02 (×2): qty 1, 90d supply, fill #0
  Filled 2021-11-25: qty 3, 90d supply, fill #0

## 2020-12-21 ENCOUNTER — Other Ambulatory Visit (HOSPITAL_COMMUNITY): Payer: Self-pay

## 2020-12-24 ENCOUNTER — Other Ambulatory Visit (HOSPITAL_COMMUNITY): Payer: Self-pay

## 2020-12-25 ENCOUNTER — Other Ambulatory Visit (HOSPITAL_COMMUNITY): Payer: Self-pay

## 2020-12-25 DIAGNOSIS — G4733 Obstructive sleep apnea (adult) (pediatric): Secondary | ICD-10-CM | POA: Diagnosis not present

## 2020-12-25 MED ORDER — DEXCOM G6 SENSOR MISC
6 refills | Status: DC
  Filled 2020-12-25: qty 3, 30d supply, fill #0
  Filled 2021-02-07: qty 3, 30d supply, fill #1
  Filled 2021-03-18: qty 3, 30d supply, fill #2
  Filled 2021-05-03: qty 3, 30d supply, fill #3
  Filled 2021-07-17: qty 3, 30d supply, fill #4
  Filled 2021-11-25: qty 3, 30d supply, fill #5

## 2020-12-27 DIAGNOSIS — K7581 Nonalcoholic steatohepatitis (NASH): Secondary | ICD-10-CM | POA: Diagnosis not present

## 2020-12-27 DIAGNOSIS — Z8616 Personal history of COVID-19: Secondary | ICD-10-CM | POA: Diagnosis not present

## 2020-12-27 DIAGNOSIS — E785 Hyperlipidemia, unspecified: Secondary | ICD-10-CM | POA: Diagnosis not present

## 2020-12-27 DIAGNOSIS — I10 Essential (primary) hypertension: Secondary | ICD-10-CM | POA: Diagnosis not present

## 2020-12-27 DIAGNOSIS — E1165 Type 2 diabetes mellitus with hyperglycemia: Secondary | ICD-10-CM | POA: Diagnosis not present

## 2020-12-27 DIAGNOSIS — Z6841 Body Mass Index (BMI) 40.0 and over, adult: Secondary | ICD-10-CM | POA: Diagnosis not present

## 2021-01-31 ENCOUNTER — Encounter: Payer: Self-pay | Admitting: Adult Health

## 2021-01-31 ENCOUNTER — Ambulatory Visit: Payer: 59 | Admitting: Adult Health

## 2021-01-31 ENCOUNTER — Encounter (HOSPITAL_COMMUNITY): Payer: Self-pay | Admitting: Emergency Medicine

## 2021-01-31 ENCOUNTER — Other Ambulatory Visit: Payer: Self-pay

## 2021-01-31 ENCOUNTER — Emergency Department (HOSPITAL_COMMUNITY)
Admission: EM | Admit: 2021-01-31 | Discharge: 2021-01-31 | Disposition: A | Discharge status: Home / Self Care | Payer: 59 | Attending: Emergency Medicine | Admitting: Emergency Medicine

## 2021-01-31 ENCOUNTER — Emergency Department (HOSPITAL_COMMUNITY): Payer: 59

## 2021-01-31 VITALS — BP 94/72 | HR 67 | Ht 69.0 in | Wt 273.0 lb

## 2021-01-31 DIAGNOSIS — R109 Unspecified abdominal pain: Secondary | ICD-10-CM | POA: Diagnosis not present

## 2021-01-31 DIAGNOSIS — E876 Hypokalemia: Secondary | ICD-10-CM | POA: Insufficient documentation

## 2021-01-31 DIAGNOSIS — E119 Type 2 diabetes mellitus without complications: Secondary | ICD-10-CM | POA: Diagnosis not present

## 2021-01-31 DIAGNOSIS — Z7984 Long term (current) use of oral hypoglycemic drugs: Secondary | ICD-10-CM | POA: Insufficient documentation

## 2021-01-31 DIAGNOSIS — R112 Nausea with vomiting, unspecified: Secondary | ICD-10-CM | POA: Diagnosis present

## 2021-01-31 DIAGNOSIS — Z20822 Contact with and (suspected) exposure to covid-19: Secondary | ICD-10-CM | POA: Insufficient documentation

## 2021-01-31 DIAGNOSIS — K529 Noninfective gastroenteritis and colitis, unspecified: Secondary | ICD-10-CM | POA: Insufficient documentation

## 2021-01-31 DIAGNOSIS — N911 Secondary amenorrhea: Secondary | ICD-10-CM

## 2021-01-31 DIAGNOSIS — R1110 Vomiting, unspecified: Secondary | ICD-10-CM | POA: Diagnosis not present

## 2021-01-31 DIAGNOSIS — Z79899 Other long term (current) drug therapy: Secondary | ICD-10-CM | POA: Diagnosis not present

## 2021-01-31 DIAGNOSIS — N7011 Chronic salpingitis: Secondary | ICD-10-CM | POA: Diagnosis not present

## 2021-01-31 LAB — RESP PANEL BY RT-PCR (FLU A&B, COVID) ARPGX2
Influenza A by PCR: NEGATIVE
Influenza B by PCR: NEGATIVE
SARS Coronavirus 2 by RT PCR: NEGATIVE

## 2021-01-31 LAB — CBC WITH DIFFERENTIAL/PLATELET
Abs Immature Granulocytes: 0.04 10*3/uL (ref 0.00–0.07)
Basophils Absolute: 0.1 10*3/uL (ref 0.0–0.1)
Basophils Relative: 1 %
Eosinophils Absolute: 0.2 10*3/uL (ref 0.0–0.5)
Eosinophils Relative: 1 %
HCT: 54.3 % — ABNORMAL HIGH (ref 36.0–46.0)
Hemoglobin: 17.7 g/dL — ABNORMAL HIGH (ref 12.0–15.0)
Immature Granulocytes: 0 %
Lymphocytes Relative: 27 %
Lymphs Abs: 3.7 10*3/uL (ref 0.7–4.0)
MCH: 29.8 pg (ref 26.0–34.0)
MCHC: 32.6 g/dL (ref 30.0–36.0)
MCV: 91.4 fL (ref 80.0–100.0)
Monocytes Absolute: 0.8 10*3/uL (ref 0.1–1.0)
Monocytes Relative: 6 %
Neutro Abs: 9 10*3/uL — ABNORMAL HIGH (ref 1.7–7.7)
Neutrophils Relative %: 65 %
Platelets: 347 10*3/uL (ref 150–400)
RBC: 5.94 MIL/uL — ABNORMAL HIGH (ref 3.87–5.11)
RDW: 13.4 % (ref 11.5–15.5)
WBC: 13.8 10*3/uL — ABNORMAL HIGH (ref 4.0–10.5)
nRBC: 0 % (ref 0.0–0.2)

## 2021-01-31 LAB — URINALYSIS, ROUTINE W REFLEX MICROSCOPIC
Bilirubin Urine: NEGATIVE
Glucose, UA: >=500 mg/dL — AB
Hgb urine dipstick: NEGATIVE
Ketones, ur: NEGATIVE mg/dL
Leukocytes,Ua: NEGATIVE
Nitrite: NEGATIVE
Protein, ur: NEGATIVE mg/dL
Specific Gravity, Urine: 1.02 (ref 1.005–1.030)
pH: 5 (ref 5.0–8.0)

## 2021-01-31 LAB — COMPREHENSIVE METABOLIC PANEL
ALT: 74 U/L — ABNORMAL HIGH (ref 0–44)
AST: 50 U/L — ABNORMAL HIGH (ref 15–41)
Albumin: 4.5 g/dL (ref 3.5–5.0)
Alkaline Phosphatase: 81 U/L (ref 38–126)
Anion gap: 11 (ref 5–15)
BUN: 9 mg/dL (ref 6–20)
CO2: 28 mmol/L (ref 22–32)
Calcium: 9 mg/dL (ref 8.9–10.3)
Chloride: 96 mmol/L — ABNORMAL LOW (ref 98–111)
Creatinine, Ser: 0.8 mg/dL (ref 0.44–1.00)
GFR, Estimated: >60 mL/min (ref >60)
Glucose, Bld: 118 mg/dL — ABNORMAL HIGH (ref 70–99)
Potassium: 3.1 mmol/L — ABNORMAL LOW (ref 3.5–5.1)
Sodium: 135 mmol/L (ref 135–145)
Total Bilirubin: 0.7 mg/dL (ref 0.3–1.2)
Total Protein: 8.4 g/dL — ABNORMAL HIGH (ref 6.5–8.1)

## 2021-01-31 LAB — LIPASE, BLOOD: Lipase: 29 U/L (ref 11–51)

## 2021-01-31 LAB — HCG, QUANTITATIVE, PREGNANCY: hCG, Beta Chain, Quant, S: <1 m[IU]/mL (ref <5)

## 2021-01-31 MED ORDER — PROMETHAZINE HCL 25 MG PO TABS: 25.0000 mg | ORAL_TABLET | Freq: Four times a day (QID) | ORAL | 1 refills | Status: DC | PRN

## 2021-01-31 MED ORDER — POTASSIUM CHLORIDE 10 MEQ/100ML IV SOLN
10.0000 meq | Freq: Once | INTRAVENOUS | Status: AC
  Administered 2021-01-31: 10 meq via INTRAVENOUS
  Filled 2021-01-31: qty 100

## 2021-01-31 MED ORDER — POTASSIUM CHLORIDE CRYS ER 20 MEQ PO TBCR: 40.0000 meq | EXTENDED_RELEASE_TABLET | Freq: Every day | ORAL | 0 refills | Status: DC

## 2021-01-31 MED ORDER — SODIUM CHLORIDE 0.9 % IV SOLN
25.0000 mg | Freq: Four times a day (QID) | INTRAVENOUS | Status: DC | PRN
  Administered 2021-01-31: 25 mg via INTRAVENOUS
  Filled 2021-01-31: qty 1

## 2021-01-31 MED ORDER — METOCLOPRAMIDE HCL 10 MG PO TABS: 10.0000 mg | ORAL_TABLET | Freq: Four times a day (QID) | ORAL | 0 refills | Status: DC | PRN

## 2021-01-31 MED ORDER — IOHEXOL 300 MG/ML  SOLN
100.0000 mL | Freq: Once | INTRAMUSCULAR | Status: AC | PRN
  Administered 2021-01-31: 100 mL via INTRAVENOUS

## 2021-01-31 MED ORDER — ONDANSETRON HCL 4 MG/2ML IJ SOLN
4.0000 mg | Freq: Once | INTRAMUSCULAR | Status: AC
  Administered 2021-01-31: 4 mg via INTRAVENOUS
  Filled 2021-01-31: qty 2

## 2021-01-31 MED ORDER — PROMETHAZINE HCL 25 MG/ML IJ SOLN
INTRAMUSCULAR | Status: AC
  Filled 2021-01-31: qty 1

## 2021-01-31 MED ORDER — MEDROXYPROGESTERONE ACETATE 10 MG PO TABS: ORAL_TABLET | ORAL | 0 refills | Status: DC

## 2021-01-31 MED ORDER — SODIUM CHLORIDE 0.9 % IV BOLUS
1000.0000 mL | Freq: Once | INTRAVENOUS | Status: AC
  Administered 2021-01-31: 1000 mL via INTRAVENOUS

## 2021-01-31 NOTE — ED Provider Notes (Signed)
[facility] EMERGENCY DEPARTMENT Provider Note   CSN: [id number] Arrival date & time: 01/31/21  0429     History  Chief Complaint  Patient presents with   Abdominal Pain    AMORE HOFLAND is a 36 y.o. female.  HPI  This is a 36 year old female who works as an Health visitor here at [employer] who presents with nausea, vomiting, diarrhea.  Patient reports approximately 9 days ago she had what she thought was a viral illness.  She had nausea, vomiting, and diarrhea.  She clinically improved and was able to work over the weekend.  However on Monday she had recurrence of symptoms.  She had vomiting and diarrhea during the day on Monday.  Since that time she has taken Kaopectate and Zofran.  She reports persistent nausea and a burning sensation in her upper abdomen.  She reports an 8 pound weight loss during this timeframe.  She states she has been unable to eat much of anything and has had anorexia.  No fevers.  No known sick contacts.  Denies chest pain or shortness of breath.    Home Medications Prior to Admission medications   Medication Sig Start Date End Date Taking? Authorizing Provider  metoCLOPramide (REGLAN) 10 MG tablet Take 1 tablet (10 mg total) by mouth every 6 (six) hours as needed for nausea or vomiting. 01/31/21  Yes Elhadji Pecore, Barbette Hair, MD  potassium chloride SA (KLOR-CON M) 20 MEQ tablet Take 2 tablets (40 mEq total) by mouth daily. 01/31/21  Yes Zivah Mayr, Barbette Hair, MD  acetaminophen (TYLENOL) 500 MG tablet Take 1,000 mg by mouth every 6 (six) hours as needed (for pain/headaches.).     [provider]  albuterol (VENTOLIN HFA) 108 (90 Base) MCG/ACT inhaler INHALE 2 PUFFS BY MOUTH EVERY 4 TO 6 HOURS AS NEEDED FOR SHORTNESS OF BREATH 09/15/19 09/14/20  Volney American, FNP  albuterol (VENTOLIN HFA) 108 (90 Base) MCG/ACT inhaler INHALE 2 PUFFS BY MOUTH EVERY 4 TO 6 HOURS AS NEEDED FOR SHORTNESS OF BREATH 11/12/20     allopurinol (ZYLOPRIM) 300 MG tablet Take 300 mg by mouth daily.      [provider]  allopurinol (ZYLOPRIM) 300 MG tablet TAKE 1 TABLET BY MOUTH ONCE DAILY 11/12/20     butalbital-acetaminophen-caffeine (FIORICET, ESGIC) 50-325-40 MG tablet Take 1 tablet by mouth daily as needed (headaches.).  Patient not taking: No sig reported 01/21/18   [provider]  cetirizine-pseudoephedrine (ZYRTEC-D) 5-120 MG tablet Take 1 tablet by mouth daily. 06/28/20   Wurst, [place], PA-C  Continuous Blood Gluc Sensor (DEXCOM G6 SENSOR) MISC CHANGE SENSOR EVERY 10 DAYS. 12/25/20     Continuous Blood Gluc Transmit (DEXCOM G6 TRANSMITTER) MISC Use as directed with Dexcom sensor. 08/31/20     Continuous Blood Gluc Transmit (DEXCOM G6 TRANSMITTER) MISC Use as directed every 10 days 12/19/20     dapagliflozin propanediol (FARXIGA) 10 MG TABS tablet Take 1 tablet by mouth once daily 06/21/20     dapagliflozin propanediol (FARXIGA) 10 MG TABS tablet TAKE 1 TABLET BY MOUTH ONCE A DAY 11/12/20     famotidine (PEPCID) 40 MG tablet Take 1 tablet (40 mg total) by mouth daily. 12/10/18   Mahala Menghini, PA-C  fexofenadine [facility] ALLERGY) 180 MG tablet Take 1 tablet by mouth daily 11/12/20     fluticasone (FLONASE) 50 MCG/ACT nasal spray Place 2 sprays into both nostrils daily.     [provider]  hydrochlorothiazide (HYDRODIURIL) 12.5 MG tablet TAKE 1 TABLET  BY MOUTH DAILY FOR HIGH BLOOD PRESSURE 09/20/20     hydrochlorothiazide (HYDRODIURIL) 12.5 MG tablet TAKE 1 TABLET BY MOUTH DAILY FOR HIGH BLOOD PRESSURE 11/12/20     metFORMIN (GLUCOPHAGE-XR) 500 MG 24 hr tablet TAKE 2 TABLETS BY MOUTH ONCE DAILY 11/24/19 11/23/20  Madelin Rear, MD  metFORMIN (GLUCOPHAGE-XR) 500 MG 24 hr tablet Take 2 tablets by mouth once daily 06/21/20     metFORMIN (GLUCOPHAGE-XR) 500 MG 24 hr tablet TAKE 2 TABLETS BY MOUTH ONCE DAILY 11/12/20     OZEMPIC, 0.25 OR 0.5 MG/DOSE, 2 MG/1.5ML SOPN Inject 0.5 mg into the skin once a week. 05/10/19   [provider]  pantoprazole (PROTONIX)  40 MG tablet Take 40 mg by mouth 2 (two) times daily.    [provider]  pantoprazole (PROTONIX) 40 MG tablet Take 1 tablet by mouth twice a day 11/12/20     pravastatin (PRAVACHOL) 20 MG tablet Take 1 tablet by mouth daily. 09/03/18   [provider]  pravastatin (PRAVACHOL) 20 MG tablet TAKE 1 TABLET BY MOUTH NIGHTLY FOR CHOLESTROL 11/12/20     Probiotic, Lactobacillus, CAPS  11/28/19   [provider]  Semaglutide, 1 MG/DOSE, (OZEMPIC, 1 MG/DOSE,) 4 MG/3ML SOPN Inject as directed under the skin once weekly 06/21/20     Semaglutide, 1 MG/DOSE, (OZEMPIC, 1 MG/DOSE,) 4 MG/3ML SOPN INJECT UNDER THE SKIN ONCE A WEEK 11/12/20         Allergies    Ace inhibitors, Beta adrenergic blockers, Augmentin [amoxicillin-pot clavulanate], Benicar [olmesartan], Doxycycline, Eggs or egg-derived products, Influenza vaccine live, and Invokana [canagliflozin]    Review of Systems   Review of Systems  Constitutional:  Negative for fever.  Gastrointestinal:  Positive for abdominal pain, diarrhea, nausea and vomiting.  All other systems reviewed and are negative.  Physical Exam Updated Vital Signs BP 102/73    Pulse 70    Temp 98 F (36.7 C) (Oral)    Resp 17    Ht 1.753 m (5\' 9" )    Wt 122.5 kg    LMP 10/06/2020 (Approximate)    SpO2 100%    BMI 39.87 kg/m  Physical Exam Vitals and nursing note reviewed.  Constitutional:      Appearance: She is well-developed. She is obese. She is not ill-appearing.  HENT:     Head: Normocephalic and atraumatic.  Eyes:     Pupils: Pupils are equal, round, and reactive to light.  Cardiovascular:     Rate and Rhythm: Normal rate and regular rhythm.     Heart sounds: Normal heart sounds.  Pulmonary:     Effort: Pulmonary effort is normal. No respiratory distress.     Breath sounds: No wheezing.  Abdominal:     General: Bowel sounds are normal.     Palpations: Abdomen is soft.     Tenderness: There is no abdominal tenderness. There is no  guarding or rebound.  Musculoskeletal:     Cervical back: Neck supple.  Skin:    General: Skin is warm and dry.  Neurological:     Mental Status: She is alert and oriented to person, place, and time.  Psychiatric:        Mood and Affect: Mood normal.    ED Results / Procedures / Treatments   Labs (all labs ordered are listed, but only abnormal results are displayed) Labs Reviewed  CBC WITH DIFFERENTIAL/PLATELET - Abnormal; Notable for the following components:      Result Value   WBC 13.8 (*)  RBC 5.94 (*)    Hemoglobin 17.7 (*)    HCT 54.3 (*)    Neutro Abs 9.0 (*)    All other components within normal limits  COMPREHENSIVE METABOLIC PANEL - Abnormal; Notable for the following components:   Potassium 3.1 (*)    Chloride 96 (*)    Glucose, Bld 118 (*)    Total Protein 8.4 (*)    AST 50 (*)    ALT 74 (*)    All other components within normal limits  URINALYSIS, ROUTINE W REFLEX MICROSCOPIC - Abnormal; Notable for the following components:   APPearance HAZY (*)    Glucose, UA >=500 (*)    Bacteria, UA RARE (*)    All other components within normal limits  RESP PANEL BY RT-PCR (FLU A&B, COVID) ARPGX2  LIPASE, BLOOD  HCG, QUANTITATIVE, PREGNANCY    EKG None  Radiology CT ABDOMEN PELVIS W CONTRAST  Result Date: 01/31/2021 CLINICAL DATA:  Nausea, vomiting and abdominal pain. EXAM: CT ABDOMEN AND PELVIS WITH CONTRAST TECHNIQUE: Multidetector CT imaging of the abdomen and pelvis was performed using the standard protocol following bolus administration of intravenous contrast. RADIATION DOSE REDUCTION: This exam was performed according to the departmental dose-optimization program which includes automated exposure control, adjustment of the mA and/or kV according to patient size and/or use of iterative reconstruction technique. CONTRAST:  167mL OMNIPAQUE IOHEXOL 300 MG/ML  SOLN COMPARISON:  CTs with IV contrast 09/25/2017, 01/31/2012. FINDINGS: Lower chest: No acute  abnormality. Hepatobiliary: 19 cm in length mildly steatotic liver. No mass enhancement. Gallbladder is absent with no biliary dilatation. Pancreas: Unremarkable pancreas and peripancreatic fat. Spleen: 13.1 cm coronal splenic diameter. No mass enhancement. 13.4 cm in length. Adrenals/Urinary Tract: No adrenal or renal cortical mass. No urinary stones or obstruction. Unremarkable bladder. Ureters are decompressed. Stomach/Bowel: Stomach distended with fluid and food products, with fold thickening of the stomach and left abdominal small bowel. There is no small bowel obstruction or inflammation. The appendix is normal. Mild stool retention ascending and transverse colon. No evidence of colitis or diverticulitis. Vascular/Lymphatic: No significant vascular findings are present. No enlarged abdominal or pelvic lymph nodes. Reproductive: The uterus is intact. The ovaries are follicular and not grossly enlarged, but there appears to have developed a hydrosalpinx on the left. No inflammatory changes are seen and no air-fluid levels. Other: Small umbilical fat hernia. No incarcerated hernia. No free air, hemorrhage or fluid. Scattered pelvic phleboliths. Musculoskeletal: Mild degenerative changes lumbar spine. No concerning regional skeletal lesions. IMPRESSION: 1. CT findings of gastroenteritis. No bowel obstruction or inflammatory change. 2. Stomach increasingly distended with fluid and food products which could be due to recent ingestion or impaired gastric emptying. 3. Constipation and diverticulosis. 4. She appears to have developed a left-sided hydrosalpinx but there are no inflammatory changes or air-fluid levels associated with this. If there are referable symptoms, ultrasound may be helpful for further study. 5. Fatty liver, with mild hepatosplenomegaly. 6. Remaining findings described above. Electronically Signed   By: Telford Nab M.D.   On: 01/31/2021 06:44    Procedures Procedures    Medications  Ordered in ED Medications  promethazine (PHENERGAN) 25 mg in sodium chloride 0.9 % 50 mL IVPB (25 mg Intravenous New Bag/Given 01/31/21 0655)  sodium chloride 0.9 % bolus 1,000 mL (0 mLs Intravenous Stopped 01/31/21 0636)  ondansetron (ZOFRAN) injection 4 mg (4 mg Intravenous Given 01/31/21 0450)  potassium chloride 10 mEq in 100 mL IVPB (0 mEq Intravenous Stopped 01/31/21 0636)  iohexol (  OMNIPAQUE) 300 MG/ML solution 100 mL (100 mLs Intravenous Contrast Given 01/31/21 [id number])    ED Course/ Medical Decision Making/ A&P Clinical Course as of 01/31/21 0659  Thu Jan 31, 2021  0608 Reviewed labs.  Discussed with patient.  Suspect that she may have some ongoing gastritis related to recent viral illness.  However, given her surgical history, partial bowel obstruction is not ruled out.  Given that she is having persistent nausea, will obtain imaging to rule out. [CH]    Clinical Course User Index [CH] Shanitha Twining, Barbette Hair, MD                           Medical Decision Making Amount and/or Complexity of Data Reviewed Labs: ordered. Radiology: ordered.  Risk Prescription drug management.   This patient presents to the ED for concern of nausea, vomiting, diarrhea, this involves an extensive number of treatment options, and is a complaint that carries with it a high risk of complications and morbidity.  The differential diagnosis includes gastroenteritis, gastroparesis, partial small bowel obstruction  MDM:    36 year old female who presents with persistent nausea and vomiting status post likely viral illness 1 week ago.  She is nontoxic-appearing and vital signs are reassuring.  She reports persistent nausea and weight loss secondary to nausea.  Her abdomen is largely nontender.  Highly suspect viral etiology.  She was given pain and nausea medication as well as fluids.  Potassium noted to be 3.1.  This was replaced IV.  Labs only notable for a white count of 13.8 but her CBC appears hemoconcentrated.   We discussed possible imaging.  The only thing that imaging would potentially rule out would be a partial SBO.  Given persistence of her symptoms we decided to proceed with CT and shared decision making.  CT shows evidence of gastroenteritis.  Also has some evidence of delayed gastric emptying.  She does have a longstanding history of diabetes but no prior history of gastroparesis.  We will trial Reglan.  She has a few incidental findings including hydrosalpinx.  She was informed of these findings and will follow-up with her OB/GYN. (Labs, imaging)  Labs: I Ordered, and personally interpreted labs.  The pertinent results include: CBC, BMP, COVID and influenza testing  Imaging Studies ordered: I ordered imaging studies including CT abdomen I independently visualized and interpreted imaging. I agree with the radiologist interpretation  Additional history obtained from patient.  External records from outside source obtained and reviewed including chart review  Critical Interventions: IV potassium, normal saline bolus  Consultations: I requested consultation with the none,  and discussed lab and imaging findings as well as pertinent plan - they recommend: None  Cardiac Monitoring: The patient was maintained on a cardiac monitor.  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, ED nurse  Disposition: Discharge  Co morbidities that complicate the patient evaluation  Past Medical History:  Diagnosis Date   Acid reflux    Asthma    Clostridium difficile infection    in the past   Diabetes mellitus    Fatty liver    Fibrocystic breast disease    Gout    Hidradenitis    History of kidney stones  Hyperlipemia    Obesity    Polycystic ovary syndrome    PONV (postoperative nausea and vomiting)    Sleep apnea    Smoker      Medicines Meds ordered this encounter  Medications   sodium chloride 0.9 % bolus 1,000 mL   ondansetron (ZOFRAN) injection 4 mg   potassium chloride 10 mEq in 100 mL IVPB   iohexol (OMNIPAQUE) 300 MG/ML solution 100 mL   promethazine (PHENERGAN) 25 mg in sodium chloride 0.9 % 50 mL IVPB   metoCLOPramide (REGLAN) 10 MG tablet    Sig: Take 1 tablet (10 mg total) by mouth every 6 (six) hours as needed for nausea or vomiting.    Dispense:  30 tablet    Refill:  0   potassium chloride SA (KLOR-CON M) 20 MEQ tablet    Sig: Take 2 tablets (40 mEq total) by mouth daily.    Dispense:  10 tablet    Refill:  0    I have reviewed the patients home medicines and have made adjustments as needed  Problem List / ED Course: Problem List Items Addressed This Visit       Other   Hypokalemia   Other Visit Diagnoses     Gastroenteritis    -  Primary                   Final Clinical Impression(s) / ED Diagnoses Final diagnoses:  Gastroenteritis  Hypokalemia    Rx / DC Orders ED Discharge Orders          Ordered    metoCLOPramide (REGLAN) 10 MG tablet  Every 6 hours PRN        01/31/21 0657    potassium chloride SA (KLOR-CON M) 20 MEQ tablet  Daily        01/31/21 0657              Merryl Hacker, MD 01/31/21 [phone]

## 2021-01-31 NOTE — Discharge Instructions (Addendum)
You were seen today for abdominal pain and nausea.  You may have some gastroparesis related to recent gastroenteritis.  Trial Reglan.  Make sure that you are taking potassium as well and follow-up with your primary physician for recheck of potassium.

## 2021-01-31 NOTE — ED Triage Notes (Signed)
Pt c/o upper abd pain with N/V/D since Tuesday.

## 2021-01-31 NOTE — Progress Notes (Signed)
°  Subjective:     Patient ID: Cheryl Black, female   DOB: 05-Jan-1986, 36 y.o.   MRN: [id number]  HPI Cheryl Black is a 36 year old white female,married, G0P0, no period since IUD removed end of September, seen in ER today for nausea,vomiting and diarrhea. HCG negative, and CT showing left hydrosalpinx, gastroenteritis , fatty liver, constipation and diverticulosis. She has GI appt in May.  Lab Results  Component Value Date   DIAGPAP  10/04/2020    - Negative for intraepithelial lesion or malignancy (NILM)   DIAGPAP  03/03/2016    NEGATIVE FOR INTRAEPITHELIAL LESIONS OR MALIGNANCY.   HPV NOT DETECTED 03/03/2016   [place] Negative 10/04/2020    Review of Systems Pain in pelvic area No period since IUD removed, spotted 01/21/21 when wiped +nausea Has lost weight about 8 lbs  Reviewed past medical,surgical, social and family history. Reviewed medications and allergies.     Objective:   Physical Exam BP 94/72 (BP Location: Left Arm, Patient Position: Sitting, Cuff Size: Large)    Pulse 67    Ht 5\' 9"  (1.753 m)    Wt 273 lb (123.8 kg)    LMP 01/21/2021 (Exact Date)    BMI 40.32 kg/m     Skin warm and dry. Lungs: clear to ausculation bilaterally. Cardiovascular: regular rate and rhythm.  Fall risk is low.   Upstream - 01/31/21 1529       Pregnancy Intention Screening   Does the patient want to become pregnant in the next year? No    Does the patient's partner want to become pregnant in the next year? No    Would the patient like to discuss contraceptive options today? Yes      Contraception Wrap Up   Current Method No Contraceptive Precautions    End Method No Contraception Precautions    Contraception Counseling Provided No             Assessment:     1. Amenorrhea, secondary Will rx provera 10 mg 1 daily for 10 days Meds ordered this encounter  Medications   medroxyPROGESTERone (PROVERA) 10 MG tablet    Sig: Take 1 daily for 10 days    Dispense:  10 tablet    Refill:  0     Order Specific Question:   Supervising Provider    Answer:   Tania Ade H [[id number]]   promethazine (PHENERGAN) 25 MG tablet    Sig: Take 1 tablet (25 mg total) by mouth every 6 (six) hours as needed for nausea or vomiting.    Dispense:  30 tablet    Refill:  1    Order Specific Question:   Supervising Provider    Answer:   Elonda Husky, LUTHER H [[id number]]     2. Hydrosalpinx Will get [name] in about 3 weeks in follow up     Plan:     Follow up with me in 3 weeks to see if has a period, and will order follow up US then

## 2021-02-06 ENCOUNTER — Other Ambulatory Visit (HOSPITAL_COMMUNITY): Payer: Self-pay

## 2021-02-06 MED ORDER — OZEMPIC (1 MG/DOSE) 4 MG/3ML ~~LOC~~ SOPN
PEN_INJECTOR | SUBCUTANEOUS | 0 refills | Status: DC
  Filled 2021-02-06: qty 9, 84d supply, fill #0

## 2021-02-06 MED ORDER — DEXCOM G6 TRANSMITTER MISC
3 refills | Status: DC
  Filled 2021-02-06 – 2021-02-27 (×2): qty 1, 90d supply, fill #0

## 2021-02-07 ENCOUNTER — Other Ambulatory Visit (HOSPITAL_COMMUNITY): Payer: Self-pay

## 2021-02-08 ENCOUNTER — Other Ambulatory Visit (HOSPITAL_COMMUNITY): Payer: Self-pay

## 2021-02-08 DIAGNOSIS — R112 Nausea with vomiting, unspecified: Secondary | ICD-10-CM | POA: Diagnosis not present

## 2021-02-08 DIAGNOSIS — R197 Diarrhea, unspecified: Secondary | ICD-10-CM | POA: Diagnosis not present

## 2021-02-08 DIAGNOSIS — K529 Noninfective gastroenteritis and colitis, unspecified: Secondary | ICD-10-CM | POA: Diagnosis not present

## 2021-02-08 MED ORDER — METOCLOPRAMIDE HCL 10 MG PO TABS
10.0000 mg | ORAL_TABLET | Freq: Four times a day (QID) | ORAL | 0 refills | Status: DC
  Filled 2021-02-08: qty 120, 30d supply, fill #0

## 2021-02-11 ENCOUNTER — Encounter: Payer: Self-pay | Admitting: Internal Medicine

## 2021-02-18 DIAGNOSIS — G4733 Obstructive sleep apnea (adult) (pediatric): Secondary | ICD-10-CM | POA: Diagnosis not present

## 2021-02-21 ENCOUNTER — Encounter: Payer: Self-pay | Admitting: Gastroenterology

## 2021-02-22 ENCOUNTER — Ambulatory Visit: Payer: 59 | Admitting: Adult Health

## 2021-02-27 ENCOUNTER — Other Ambulatory Visit (HOSPITAL_COMMUNITY): Payer: Self-pay

## 2021-02-28 ENCOUNTER — Other Ambulatory Visit (HOSPITAL_COMMUNITY): Payer: Self-pay

## 2021-03-01 ENCOUNTER — Other Ambulatory Visit: Payer: Self-pay

## 2021-03-01 ENCOUNTER — Ambulatory Visit: Payer: 59 | Admitting: Adult Health

## 2021-03-01 ENCOUNTER — Encounter: Payer: Self-pay | Admitting: Adult Health

## 2021-03-01 VITALS — BP 115/82 | HR 85 | Ht 69.0 in | Wt 278.5 lb

## 2021-03-01 DIAGNOSIS — N911 Secondary amenorrhea: Secondary | ICD-10-CM

## 2021-03-01 DIAGNOSIS — N7011 Chronic salpingitis: Secondary | ICD-10-CM

## 2021-03-01 NOTE — Progress Notes (Signed)
°  Subjective:     Patient ID: Cheryl Black, female   DOB: [birth year], 36 y.o.   MRN: [id number]  HPI Cheryl Black is a 36 year old white female, married, G0P0, back in follow up after taking provera 10 mg for 10 days to get withdrawal bled, where had not had periods since end of September when had IUD removed. And she did start 02/15/21  and bled for 8 days.  She had, had nausea,vomiting and and diarrhea and seen in ER 01/31/21 and CT showed left hydrosalpinx,gastroenteritis,fatty liver, constipation and diverticulosis. She has made appt with Dr Myrtie Neither with [facility] 03/15/21,since could not see [facility] til May, and has stopped ozempic in last 2 weeks and feels better.  Lab Results  Component Value Date   DIAGPAP  10/04/2020    - Negative for intraepithelial lesion or malignancy (NILM)   HPV NOT DETECTED 03/03/2016   [place]PV[place] Negative 10/04/2020    Review of Systems Had period after provera Reviewed past medical,surgical, social and family history. Reviewed medications and allergies.     Objective:   Physical Exam BP 115/82 (BP Location: Left Arm, Patient Position: Sitting, Cuff Size: Large)    Pulse 85    Ht 5\' 9"  (1.753 m)    Wt 278 lb 8 oz (126.3 kg)    LMP 02/15/2021    BMI 41.13 kg/m     Skin warm and dry. Lungs: clear to ausculation bilaterally. Cardiovascular: regular rate and rhythm.  Fall risk is moderate  Upstream - 03/01/21 0914       Pregnancy Intention Screening   Does the patient want to become pregnant in the next year? Ok Either Way    Does the patient's partner want to become pregnant in the next year? Ok Either Way    Would the patient like to discuss contraceptive options today? No      Contraception Wrap Up   Current Method No Method - Other Reason    End Method No Method - Other Reason    Contraception Counseling Provided No             Assessment:     1. Amenorrhea, secondary She had period after provera Keep period log and if no period in May, call me will cycle  with provera 10 mg for 10 days every 3 months  2. Hydrosalpinx Return 03/04/21 for pelvic 03/06/21 to assess resolve/stability     Plan:     Will talk when [name] results back    Keep GI appt 03/15/21

## 2021-03-04 ENCOUNTER — Ambulatory Visit (INDEPENDENT_AMBULATORY_CARE_PROVIDER_SITE_OTHER): Payer: 59

## 2021-03-04 ENCOUNTER — Other Ambulatory Visit: Payer: Self-pay

## 2021-03-04 DIAGNOSIS — N7011 Chronic salpingitis: Secondary | ICD-10-CM | POA: Diagnosis not present

## 2021-03-04 NOTE — Progress Notes (Signed)
PELVIC US TA/TV: homogeneous anteverted uterus,WNL,simple left ovarian cyst 3.4 X 3.3 X 2.7 cm,normal right ovary, EEC 7 mm,no pain during ultrasound,no free fluid

## 2021-03-12 ENCOUNTER — Telehealth: Payer: Self-pay | Admitting: Adult Health

## 2021-03-12 NOTE — Telephone Encounter (Signed)
Pt aware that US showed normal uterus and right ovary, has simple cyst on the left ovary 3.4 x 3.3 x 2.7 cm and NO hydrosalpinx seen  ?

## 2021-03-15 ENCOUNTER — Encounter: Payer: Self-pay | Admitting: Gastroenterology

## 2021-03-15 ENCOUNTER — Ambulatory Visit: Payer: 59 | Admitting: Gastroenterology

## 2021-03-15 VITALS — BP 110/70 | HR 83 | Ht 68.0 in | Wt 279.0 lb

## 2021-03-15 DIAGNOSIS — K7581 Nonalcoholic steatohepatitis (NASH): Secondary | ICD-10-CM | POA: Diagnosis not present

## 2021-03-15 DIAGNOSIS — R112 Nausea with vomiting, unspecified: Secondary | ICD-10-CM

## 2021-03-15 DIAGNOSIS — R933 Abnormal findings on diagnostic imaging of other parts of digestive tract: Secondary | ICD-10-CM

## 2021-03-15 NOTE — Patient Instructions (Signed)
If you are age 37 or older, your body mass index should be between 23-30. Your Body mass index is 42.42 kg/m?Marland Kitchen If this is out of the aforementioned range listed, please consider follow up with your Primary Care Provider. ? ?If you are age 68 or younger, your body mass index should be between 19-25. Your Body mass index is 42.42 kg/m?Marland Kitchen If this is out of the aformentioned range listed, please consider follow up with your Primary Care Provider.  ? ?________________________________________________________ ? ?The  GI providers would like to encourage you to use [facility] to communicate with providers for non-urgent requests or questions.  Due to long hold times on the telephone, sending your provider a message by [facility] may be a faster and more efficient way to get a response.  Please allow 48 business hours for a response.  Please remember that this is for non-urgent requests.  ?_______________________________________________________ ? ?Follow up as needed.  ? ?It was a pleasure to see you today! ? ?Thank you for trusting me with your gastrointestinal care!   ? ? ?

## 2021-03-15 NOTE — Progress Notes (Signed)
[facility] Consult Note:  History: Cheryl Black 03/15/2021  Referring provider: [name], MD  Reason for consult/chief complaint: Nausea (And vomiting, hasn't been sick since mid Feb. Except one time After she stopped ozempic on Feb 8th. 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 She has been a longtime patient of the [facility], last seen June 2021 including the following in that note: "HPI: STORY CONTI is a 36 y.o. female here for follow-up of GERD and fatty liver.  Transaminitis likely due to steatohepatitis.  Liver biopsy in 2013 with mild early steatohepatitis.  History of chronic fleeting and positional right upper quadrant pain felt to be due to musculoskeletal versus postcholecystectomy syndrome.  She had EGD in September 2020 showing mild erosive reflux esophagitis.  Esophagus was dilated for her history of dysphagia.  She has been on pantoprazole 40 mg twice daily.  Added Pepcid 40 mg daily.   Patient is doing well.  Weight is down 15 pounds in the past year.  She is now back on statin for her cholesterol.  She goes for labs today.  Recently diagnosed with endometriosis.  Denies any significant reflux symptoms.  Some vague dysphagia symptoms but overall improved since esophageal dilation last year.  No nausea or vomiting.  No upper abdominal pain.  Some crampy abdominal pain with menstrual cycles.  Bowel movements regular.  No melena or rectal bleeding." ___________ Cheryl Black has been experiencing upper digestive symptoms for at least several weeks and contacted Dr. Orvan Falconer office, but was told they cannot see her until May.  After the ED visit her primary care office also sent an urgent referral and she contacted them but was again told she cannot be seen till May.  She recalls developing  an acute onset illness with nausea vomiting and diarrhea about 6 weeks prior to visit to the ED on January 26.  Symptoms would then improved but she would have recurrence of belching nausea early satiety and sour smelling belching.  Diarrhea resolved, and some stool infectious testing by primary care was negative as noted below. After the ED visit, there was concern for gastroparesis and she was started on metoclopramide 10 mg 3-4 times a day.  She stopped doing that about 2 weeks after the visit because she was feeling better, and also after doing some research decided to stop her Ozempic about 4 weeks ago.  She had been on it a few years without upper digestive symptoms, and said it has been tremendously helpful controlling her diabetes and helping her lose about 40 pounds.  She sees an endocrinologist and knows that her hemoglobin A1c was 5.7 about 3 months ago.  At this point, she has no nausea vomiting early satiety or belching and is really feeling back to normal.  She has no further diarrhea, and is wondering what she should do with her medicines or whether she needs further testing for gastroparesis.  ROS:  Review of Systems  Constitutional:  Negative for appetite change and unexpected weight change.  HENT:  Negative for mouth sores and voice change.   Eyes:  Negative for pain and redness.  Respiratory:  Negative for cough and shortness of breath.   Cardiovascular:  Negative for chest pain and palpitations.  Genitourinary:  Negative for dysuria and hematuria.  Musculoskeletal:  Negative for arthralgias and myalgias.  Skin:  Negative for pallor and rash.  Neurological:  Negative for weakness and headaches.  Hematological:  Negative for adenopathy.    Past Medical History: Past Medical History:  Diagnosis Date   Acid reflux    Asthma    Clostridium difficile infection    in the past   Diabetes mellitus    Fatty liver    Fibrocystic breast disease    Gout    Hidradenitis     History of kidney stones    Hyperlipemia    Obesity    Polycystic ovary syndrome    PONV (postoperative nausea and vomiting)    Sleep apnea    Smoker      Past Surgical History: Past Surgical History:  Procedure Laterality Date   CHOLECYSTECTOMY  01/10/2011   Procedure: LAPAROSCOPIC CHOLECYSTECTOMY;  Surgeon: Jamesetta So;  Location: [facility];  Service: General;  Laterality: N/A;   ESOPHAGOGASTRODUODENOSCOPY (EGD) WITH PROPOFOL N/A 09/09/2018   RMR: Mild erosive reflux esophagitis.  Esophagus dilated for history of dysphagia.   HYDRADENITIS EXCISION Right 06/18/2012   Procedure: EXCISION HYDRIADENITIS SUPRATIVA  RIGHT AXILLA;  Surgeon: Scherry Ran, MD;  Location: [facility];  Service: General;  Laterality: Right;   LIVER BIOPSY  01/10/2011   Procedure: LIVER BIOPSY;  Surgeon: Jamesetta So;  Location: [facility];  Service: General;;   MALONEY DILATION N/A 09/09/2018   Procedure: Venia Minks DILATION;  Surgeon: Daneil Dolin, MD;  Location: [facility];  Service: Endoscopy;  Laterality: N/A;   SKIN SPLIT GRAFT Right 06/18/2012   Procedure: SKIN GRAFT SPLIT THICKNESS RIGHT AXILLA(WITH TWO STANDARD SKIN BOARDS 3"x 8" )  DONOR SITE RIGHT AND LEFT THIGHS;  Surgeon: Scherry Ran, MD;  Location: [facility];  Service: General;  Laterality: Right;     Family History: Family History  Problem Relation Age of Onset   Stroke Other        paternal great grandfather   Diabetes Father    Hypertension Father    Cancer Maternal Grandfather        adenocarcinoma   Emphysema Maternal Grandfather        smoked   Heart attack Paternal Grandfather    Hypertension Paternal Grandmother    Diabetes Paternal Grandmother    Gout Paternal Grandmother    Hypertension Maternal Grandmother    Other Mother        pre-cancerous cells; had hyst   Emphysema Mother        smoker   Gout Brother    Cancer Sister        cervical; had hyst   Asthma Maternal Uncle    Colon cancer Neg Hx    Liver disease Neg Hx     Liver cancer Neg Hx     Social History: Social History   Socioeconomic History   Marital status: Married    Spouse name: [name]   Number of children: 0   Years of education: Not on file   Highest education level: Not on file  Occupational History   Occupation: nurse  Tobacco Use   Smoking status: Former    Packs/day: 1.00    Years: 10.00    Pack years: 10.00    Types: Cigarettes    Quit date: 08/23/2017    Years since quitting: 3.5   Smokeless tobacco: Never  Vaping Use   Vaping Use: Never used  Substance and Sexual Activity   Alcohol use: Not Currently  Comment: occas   Drug use: No   Sexual activity: Yes    Birth control/protection: None  Other Topics Concern   Not on file  Social History Narrative   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   Social Determinants of Health   Financial Resource Strain: Low Risk    Difficulty of Paying Living Expenses: Not hard at all  Food Insecurity: No Food Insecurity   Worried About Charity fundraiser in the Last Year: Never true   [place] in the Last Year: Never true  Transportation Needs: No Transportation Needs   Lack of Transportation (Medical): No   Lack of Transportation (Non-Medical): No  Physical Activity: Sufficiently Active   Days of Exercise per Week: 4 days   Minutes of Exercise per Session: 60 min  Stress: No Stress Concern Present   Feeling of Stress : Only a little  Social Connections: Socially Isolated   Frequency of Communication with Friends and Family: Once a week   Frequency of Social Gatherings with Friends and Family: Once a week   Attends Religious Services: Never   Marine scientist or Organizations: No   Attends Archivist Meetings: Never   Marital Status: Married    Allergies: Allergies  Allergen Reactions   Ace Inhibitors Anaphylaxis   Beta Adrenergic Blockers Other (See Comments)    Angioedema  Other reaction(s): Angioedema   Augmentin [Amoxicillin-Pot Clavulanate] Other  (See Comments)    Has had C.diff in the past Did it involve swelling of the face/tongue/throat, SOB, or low BP? No Did it involve sudden or severe rash/hives, skin peeling, or any reaction on the inside of your mouth or nose? No Did you need to seek medical attention at a hospital or doctor's office? No When did it last happen?Unknown If all above answers are NO, may proceed with cephalosporin use.    Benicar [Olmesartan] Other (See Comments)    Facial edema   Doxycycline Other (See Comments)    Makes feet and hands red   Eggs Or Egg-Derived Products     Local reaction with injectable meds that contain eggs   Influenza Vaccine Live Swelling    Arm    Influenza Virus Vaccine Hives   Invokana [Canagliflozin] Other (See Comments)    Dehydration    Liraglutide Nausea And Vomiting    Outpatient Meds: Current Outpatient Medications  Medication Sig Dispense Refill   acetaminophen (TYLENOL) 500 MG tablet Take 1,000 mg by mouth every 6 (six) hours as needed (for pain/headaches.).      albuterol (VENTOLIN HFA) 108 (90 Base) MCG/ACT inhaler INHALE 2 PUFFS BY MOUTH EVERY 4 TO 6 HOURS AS NEEDED FOR SHORTNESS OF BREATH 18 g 2   allopurinol (ZYLOPRIM) 300 MG tablet TAKE 1 TABLET BY MOUTH ONCE DAILY 90 tablet 2   butalbital-acetaminophen-caffeine (FIORICET, ESGIC) 50-325-40 MG tablet Take 1 tablet by mouth daily as needed (headaches.).     cetirizine-pseudoephedrine (ZYRTEC-D) 5-120 MG tablet Take 1 tablet by mouth daily. 30 tablet 0   Continuous Blood Gluc Sensor (DEXCOM G6 SENSOR) MISC CHANGE SENSOR EVERY 10 DAYS. 3 each 6   Continuous Blood Gluc Transmit (DEXCOM G6 TRANSMITTER) MISC Use as directed every 10 days 3 each 6   Continuous Blood Gluc Transmit (DEXCOM G6 TRANSMITTER) MISC Use as directed with Dexcom sensor, replace every 90 days 1 each 3   dapagliflozin propanediol (FARXIGA) 10 MG TABS tablet Take 1 tablet by mouth once daily 90 tablet 1  fexofenadine (ALLEGRA ALLERGY) 180 MG  tablet Take 1 tablet by mouth daily 90 tablet 2   fluticasone (FLONASE) 50 MCG/ACT nasal spray Place 2 sprays into both nostrils daily.      hydrochlorothiazide (HYDRODIURIL) 12.5 MG tablet TAKE 1 TABLET BY MOUTH DAILY FOR HIGH BLOOD PRESSURE 90 tablet 2   metFORMIN (GLUCOPHAGE-XR) 500 MG 24 hr tablet Take 2 tablets by mouth once daily 180 tablet 3   metoCLOPramide (REGLAN) 10 MG tablet Take 1 tablet (10 mg total) by mouth every 6 (six) hours as needed for nausea or vomiting. 30 tablet 0   metoCLOPramide (REGLAN) 10 MG tablet Take 1 tablet by mouth every 6 (six) hours. 120 tablet 0   pantoprazole (PROTONIX) 40 MG tablet Take 1 tablet by mouth twice a day 180 tablet 2   potassium chloride SA (KLOR-CON M) 20 MEQ tablet Take 2 tablets (40 mEq total) by mouth daily. 10 tablet 0   pravastatin (PRAVACHOL) 20 MG tablet TAKE 1 TABLET BY MOUTH NIGHTLY FOR CHOLESTROL 90 tablet 2   Probiotic, Lactobacillus, CAPS      promethazine (PHENERGAN) 25 MG tablet Take 1 tablet (25 mg total) by mouth every 6 (six) hours as needed for nausea or vomiting. 30 tablet 1   No current facility-administered medications for this visit.      ___________________________________________________________________ Objective   Exam:  BP 110/70    Pulse 83    Ht 5' 8"  (1.727 m)    Wt 279 lb (126.6 kg)    LMP 02/15/2021    BMI 42.42 kg/m  Wt Readings from Last 3 Encounters:  03/15/21 279 lb (126.6 kg)  03/01/21 278 lb 8 oz (126.3 kg)  01/31/21 273 lb (123.8 kg)    General: Well-appearing Eyes: sclera anicteric, no redness ENT: oral mucosa moist without lesions, no cervical or supraclavicular lymphadenopathy CV: RRR without murmur, S1/S2, no JVD, no peripheral edema Resp: clear to auscultation bilaterally, normal RR and effort noted GI: soft, no tenderness, with active bowel sounds. No guarding or palpable organomegaly noted.  Glucose monitor upper abdominal wall Skin; warm and dry, no rash or jaundice noted Neuro:  awake, alert and oriented x 3. Normal gross motor function and fluent speech  Labs:  Iron/TIBC/Ferritin/ %Sat    Component Value Date/Time   IRON 92 11/12/2017 1318   TIBC 369 11/12/2017 1318   FERRITIN 344 (H) 11/12/2017 1318   IRONPCTSAT 25 11/12/2017 1318     CBC Latest Ref Rng & Units 01/31/2021 06/06/2019 09/07/2018  WBC 4.0 - 10.5 K/uL 13.8(H) 12.0(H) 12.7(H)  Hemoglobin 12.0 - 15.0 g/dL 17.7(H) 16.3(H) 14.8  Hematocrit 36.0 - 46.0 % 54.3(H) 48.9(H) 45.6  Platelets 150 - 400 K/uL 347 263 303   CMP Latest Ref Rng & Units 01/31/2021 06/06/2019 09/07/2018  Glucose 70 - 99 mg/dL 118(H) 164(H) 200(H)  BUN 6 - 20 mg/dL 9 10 10   Creatinine 0.44 - 1.00 mg/dL 0.80 0.75 0.56  Sodium 135 - 145 mmol/L 135 138 135  Potassium 3.5 - 5.1 mmol/L 3.1(L) 3.4(L) 3.4(L)  Chloride 98 - 111 mmol/L 96(L) 106 103  CO2 22 - 32 mmol/L 28 23 23   Calcium 8.9 - 10.3 mg/dL 9.0 8.9 8.7(L)  Total Protein 6.5 - 8.1 g/dL 8.4(H) 6.4(L) 7.0  Total Bilirubin 0.3 - 1.2 mg/dL 0.7 0.6 0.3  Alkaline Phos 38 - 126 U/L 81 70 92  AST 15 - 41 U/L 50(H) 25 49(H)  ALT 0 - 44 U/L 74(H) 38 88(H)   Last hemoglobin  A1c on file in this EMR was 8.9 in March 2020  Primary care ordered some stool studies dated 02/08/2021, and consist of negative ova and parasite testing, negative Yersinia and vibrio as well as negative fecal WBC  Negative urea breath test, normal amylase and lipase  AST 41, ALT 51, alkaline phosphatase 76, total bilirubin 0.5  Radiologic Studies:  CLINICAL DATA:  Nausea, vomiting and abdominal pain.   EXAM: CT ABDOMEN AND PELVIS WITH CONTRAST   TECHNIQUE: Multidetector CT imaging of the abdomen and pelvis was performed using the standard protocol following bolus administration of intravenous contrast.   RADIATION DOSE REDUCTION: This exam was performed according to the departmental dose-optimization program which includes automated exposure control, adjustment of the mA and/or kV according to patient  size and/or use of iterative reconstruction technique.   CONTRAST:  148m OMNIPAQUE IOHEXOL 300 MG/ML  SOLN   COMPARISON:  CTs with IV contrast 09/25/2017, 01/31/2012.   FINDINGS: Lower chest: No acute abnormality.   Hepatobiliary: 19 cm in length mildly steatotic liver. No mass enhancement. Gallbladder is absent with no biliary dilatation.   Pancreas: Unremarkable pancreas and peripancreatic fat.   Spleen: 13.1 cm coronal splenic diameter. No mass enhancement. 13.4 cm in length.   Adrenals/Urinary Tract: No adrenal or renal cortical mass. No urinary stones or obstruction. Unremarkable bladder. Ureters are decompressed.   Stomach/Bowel: Stomach distended with fluid and food products, with fold thickening of the stomach and left abdominal small bowel. There is no small bowel obstruction or inflammation. The appendix is normal. Mild stool retention ascending and transverse colon. No evidence of colitis or diverticulitis.   Vascular/Lymphatic: No significant vascular findings are present. No enlarged abdominal or pelvic lymph nodes.   Reproductive: The uterus is intact. The ovaries are follicular and not grossly enlarged, but there appears to have developed a hydrosalpinx on the left. No inflammatory changes are seen and no air-fluid levels.   Other: Small umbilical fat hernia. No incarcerated hernia. No free air, hemorrhage or fluid. Scattered pelvic phleboliths.   Musculoskeletal: Mild degenerative changes lumbar spine. No concerning regional skeletal lesions.   IMPRESSION: 1. CT findings of gastroenteritis. No bowel obstruction or inflammatory change. 2. Stomach increasingly distended with fluid and food products which could be due to recent ingestion or impaired gastric emptying. 3. Constipation and diverticulosis. 4. She appears to have developed a left-sided hydrosalpinx but there are no inflammatory changes or air-fluid levels associated with this. If there are  referable symptoms, ultrasound may be helpful for further study. 5. Fatty liver, with mild hepatosplenomegaly. 6. Remaining findings described above.     Electronically Signed   By: KTelford NabM.D.   On: 01/31/2021 06:44  CT images personally reviewed - HD  Assessment: Encounter Diagnoses  Name Primary?   Nausea and vomiting in adult Yes   Abnormal finding on GI tract imaging    NASH (nonalcoholic steatohepatitis)     This is a somewhat puzzling picture that has raised clinical concerns for gastroparesis.  However, it began as an acute onset illness especially recalls, with waxing and waning symptoms over weeks until the ED visit. She has no other diabetic endorgan damage, which would make it unusual for isolated gastroparesis to occur, especially with her excellent control of diabetes lately.  I suspect she had an acute infectious gastroenteritis with a postinfectious syndrome causing gastric dysmotility and gastroparesis like clinical picture and radiographic findings. Some evidence of this is that she is now considerably improved off metoclopramide, but also  off Ozempic.  However, she tolerated Ozempic well without digestive side effects for years, making it unlikely for that to have caused all of this.  Nyeisha has also had excellent results from the [facility], so we do not want to permanently discontinue it without very good reason.  Her underlying NASH is all the more reason to continue that medicine if possible  Regarding her steatohepatitis, this also needs long-term follow-up with her primary GI practice when she can reestablish care with them.  No CT findings suggestive of portal hypertension.  This needs ongoing excellent control of diabetes, lipids and weight.  Incidentally, she has a chronically elevated hemoglobin.  Not withstanding that she was volume depleted during the ED visit based on overall clinical picture and lab results, she had an elevated hemoglobin on routine  labs in May 2021 as well.  Her last iron studies on file are not consistent with hemochromatosis (most likely mild secondary iron overload from NASH) , though I recommended she reconvene with primary care and have those checked with her next scheduled lab testing. If still not consistent with hemochromatosis, lab testing to rule out polycythemia vera.  Plan:  My advice to her at this point is resume Ozempic at her previous dose and reassess for recurrence of any upper digestive symptoms such as belching, nausea, early satiety and vomiting. If that occurs, she will alert me, at which time we would most likely have her hold the Ozempic again and do a gastric emptying study.  Thank you for the courtesy of this consult.  Please call me with any questions or concerns.  Nelida Meuse III  CC: Referring provider noted above

## 2021-03-18 ENCOUNTER — Other Ambulatory Visit (HOSPITAL_COMMUNITY): Payer: Self-pay

## 2021-03-19 DIAGNOSIS — G4733 Obstructive sleep apnea (adult) (pediatric): Secondary | ICD-10-CM | POA: Diagnosis not present

## 2021-04-19 ENCOUNTER — Ambulatory Visit
Admission: EM | Admit: 2021-04-19 | Discharge: 2021-04-19 | Disposition: A | Discharge status: Home / Self Care | Payer: 59 | Attending: Urgent Care | Admitting: Urgent Care

## 2021-04-19 DIAGNOSIS — R350 Frequency of micturition: Secondary | ICD-10-CM | POA: Diagnosis not present

## 2021-04-19 DIAGNOSIS — E119 Type 2 diabetes mellitus without complications: Secondary | ICD-10-CM | POA: Insufficient documentation

## 2021-04-19 DIAGNOSIS — N3001 Acute cystitis with hematuria: Secondary | ICD-10-CM | POA: Diagnosis not present

## 2021-04-19 DIAGNOSIS — E118 Type 2 diabetes mellitus with unspecified complications: Secondary | ICD-10-CM

## 2021-04-19 LAB — POCT URINALYSIS DIP (MANUAL ENTRY)
Bilirubin, UA: NEGATIVE
Glucose, UA: >=1000 mg/dL — AB
Nitrite, UA: NEGATIVE
Protein Ur, POC: 100 mg/dL — AB
Spec Grav, UA: 1.02 (ref 1.010–1.025)
Urobilinogen, UA: 0.2 E.U./dL
pH, UA: 6 (ref 5.0–8.0)

## 2021-04-19 MED ORDER — CEPHALEXIN 500 MG PO CAPS: 500.0000 mg | ORAL_CAPSULE | Freq: Two times a day (BID) | ORAL | 0 refills | Status: DC

## 2021-04-19 NOTE — ED Provider Notes (Signed)
?[place] ? ? ?MRN: [id number] DOB: 11/11/1985 ? ?Subjective:  ? ?Cheryl Black is a 36 y.o. female presenting for 2 to 3-day history of acute onset dysuria, hematuria, urinary frequency, low back pain.  Patient hydrates well with water.  However she also drinks about 20 to 32 ounces of sweet tea daily. ? ?No current facility-administered medications for this encounter. ? ?Current Outpatient Medications:  ?  acetaminophen (TYLENOL) 500 MG tablet, Take 1,000 mg by mouth every 6 (six) hours as needed (for pain/headaches.). , Disp: , Rfl:  ?  albuterol (VENTOLIN HFA) 108 (90 Base) MCG/ACT inhaler, INHALE 2 PUFFS BY MOUTH EVERY 4 TO 6 HOURS AS NEEDED FOR SHORTNESS OF BREATH, Disp: 18 g, Rfl: 2 ?  allopurinol (ZYLOPRIM) 300 MG tablet, TAKE 1 TABLET BY MOUTH ONCE DAILY, Disp: 90 tablet, Rfl: 2 ?  butalbital-acetaminophen-caffeine (FIORICET, ESGIC) 50-325-40 MG tablet, Take 1 tablet by mouth daily as needed (headaches.)., Disp: , Rfl:  ?  cetirizine-pseudoephedrine (ZYRTEC-D) 5-120 MG tablet, Take 1 tablet by mouth daily., Disp: 30 tablet, Rfl: 0 ?  Continuous Blood Gluc Sensor (DEXCOM G6 SENSOR) MISC, CHANGE SENSOR EVERY 10 DAYS., Disp: 3 each, Rfl: 6 ?  Continuous Blood Gluc Transmit (DEXCOM G6 TRANSMITTER) MISC, Use as directed every 10 days, Disp: 3 each, Rfl: 6 ?  Continuous Blood Gluc Transmit (DEXCOM G6 TRANSMITTER) MISC, Use as directed with Dexcom sensor, replace every 90 days, Disp: 1 each, Rfl: 3 ?  dapagliflozin propanediol (FARXIGA) 10 MG TABS tablet, Take 1 tablet by mouth once daily, Disp: 90 tablet, Rfl: 1 ?  fexofenadine (ALLEGRA ALLERGY) 180 MG tablet, Take 1 tablet by mouth daily, Disp: 90 tablet, Rfl: 2 ?  fluticasone (FLONASE) 50 MCG/ACT nasal spray, Place 2 sprays into both nostrils daily. , Disp: , Rfl:  ?  hydrochlorothiazide (HYDRODIURIL) 12.5 MG tablet, TAKE 1 TABLET BY MOUTH DAILY FOR HIGH BLOOD PRESSURE, Disp: 90 tablet, Rfl: 2 ?  metFORMIN (GLUCOPHAGE-XR) 500 MG 24 hr  tablet, Take 2 tablets by mouth once daily, Disp: 180 tablet, Rfl: 3 ?  metoCLOPramide (REGLAN) 10 MG tablet, Take 1 tablet (10 mg total) by mouth every 6 (six) hours as needed for nausea or vomiting., Disp: 30 tablet, Rfl: 0 ?  metoCLOPramide (REGLAN) 10 MG tablet, Take 1 tablet by mouth every 6 (six) hours., Disp: 120 tablet, Rfl: 0 ?  pantoprazole (PROTONIX) 40 MG tablet, Take 1 tablet by mouth twice a day, Disp: 180 tablet, Rfl: 2 ?  potassium chloride SA (KLOR-CON M) 20 MEQ tablet, Take 2 tablets (40 mEq total) by mouth daily., Disp: 10 tablet, Rfl: 0 ?  pravastatin (PRAVACHOL) 20 MG tablet, TAKE 1 TABLET BY MOUTH NIGHTLY FOR CHOLESTROL, Disp: 90 tablet, Rfl: 2 ?  Probiotic, Lactobacillus, CAPS, , Disp: , Rfl:  ?  promethazine (PHENERGAN) 25 MG tablet, Take 1 tablet (25 mg total) by mouth every 6 (six) hours as needed for nausea or vomiting., Disp: 30 tablet, Rfl: 1  ? ?Allergies  ?Allergen Reactions  ? Ace Inhibitors Anaphylaxis  ? Beta Adrenergic Blockers Other (See Comments)  ?  Angioedema ? ?Other reaction(s): Angioedema  ? Augmentin [Amoxicillin-Pot Clavulanate] Other (See Comments)  ?  Has had C.diff in the past ?Did it involve swelling of the face/tongue/throat, SOB, or low BP? No ?Did it involve sudden or severe rash/hives, skin peeling, or any reaction on the inside of your mouth or nose? No ?Did you need to seek medical attention at a hospital or doctor's office? No ?  When did it last happen?Unknown ?If all above answers are ?NO?, may proceed with cephalosporin use. ?  ? Benicar [Olmesartan] Other (See Comments)  ?  Facial edema  ? Doxycycline Other (See Comments)  ?  Makes feet and hands red  ? Eggs Or Egg-Derived Products   ?  Local reaction with injectable meds that contain eggs  ? Influenza Vaccine Live Swelling  ?  Arm   ? Influenza Virus Vaccine Hives  ? Invokana [Canagliflozin] Other (See Comments)  ?  Dehydration ?  ? Liraglutide Nausea And Vomiting  ? ? ?Past Medical History:  ?Diagnosis Date   ? Acid reflux   ? Asthma   ? Clostridium difficile infection   ? in the past  ? Diabetes mellitus   ? Fatty liver   ? Fibrocystic breast disease   ? Gout   ? Hidradenitis   ? History of kidney stones   ? Hyperlipemia   ? Obesity   ? Polycystic ovary syndrome   ? PONV (postoperative nausea and vomiting)   ? Sleep apnea   ? Smoker   ?  ? ?Past Surgical History:  ?Procedure Laterality Date  ? CHOLECYSTECTOMY  01/10/2011  ? Procedure: LAPAROSCOPIC CHOLECYSTECTOMY;  Surgeon: Jamesetta So;  Location: [facility];  Service: General;  Laterality: N/A;  ? ESOPHAGOGASTRODUODENOSCOPY (EGD) WITH PROPOFOL N/A 09/09/2018  ? RMR: Mild erosive reflux esophagitis.  Esophagus dilated for history of dysphagia.  ? HYDRADENITIS EXCISION Right 06/18/2012  ? Procedure: EXCISION HYDRIADENITIS SUPRATIVA  RIGHT AXILLA;  Surgeon: Scherry Ran, MD;  Location: [facility];  Service: General;  Laterality: Right;  ? LIVER BIOPSY  01/10/2011  ? Procedure: LIVER BIOPSY;  Surgeon: Jamesetta So;  Location: [facility];  Service: General;;  ? [place] N/A 09/09/2018  ? Procedure: MALONEY DILATION;  Surgeon: Daneil Dolin, MD;  Location: [facility];  Service: Endoscopy;  Laterality: N/A;  ? SKIN SPLIT GRAFT Right 06/18/2012  ? Procedure: SKIN GRAFT SPLIT THICKNESS RIGHT AXILLA(WITH TWO STANDARD SKIN BOARDS 3"x 8" )  DONOR SITE RIGHT AND LEFT THIGHS;  Surgeon: Scherry Ran, MD;  Location: [facility];  Service: General;  Laterality: Right;  ? ? ?Family History  ?Problem Relation Age of Onset  ? Stroke Other   ?     paternal great grandfather  ? Diabetes Father   ? Hypertension Father   ? Cancer Maternal Grandfather   ?     adenocarcinoma  ? Emphysema Maternal Grandfather   ?     smoked  ? Heart attack Paternal Grandfather   ? Hypertension Paternal Grandmother   ? Diabetes Paternal Grandmother   ? Gout Paternal Grandmother   ? Hypertension Maternal Grandmother   ? Other Mother   ?     pre-cancerous cells; had hyst  ? Emphysema Mother   ?     smoker  ?  Gout Brother   ? Cancer Sister   ?     cervical; had hyst  ? Asthma Maternal Uncle   ? Colon cancer Neg Hx   ? Liver disease Neg Hx   ? Liver cancer Neg Hx   ? ? ?Social History  ? ?Tobacco Use  ? Smoking status: Former  ?  Packs/day: 1.00  ?  Years: 10.00  ?  Pack years: 10.00  ?  Types: Cigarettes  ?  Quit date: 08/23/2017  ?  Years since quitting: 3.6  ? Smokeless tobacco: Never  ?Vaping Use  ? Vaping Use:  Never used  ?Substance Use Topics  ? Alcohol use: Not Currently  ?  Comment: occas  ? Drug use: No  ? ? ?ROS ? ? ?Objective:  ? ?Vitals: ?BP 123/83   Pulse 84   Temp 98.2 ?F (36.8 ?C)   Resp 20   LMP 02/15/2021   SpO2 98%  ? ?Physical Exam ?Constitutional:   ?   General: She is not in acute distress. ?   Appearance: Normal appearance. She is well-developed. She is obese. She is not ill-appearing, toxic-appearing or diaphoretic.  ?HENT:  ?   Head: Normocephalic and atraumatic.  ?   Nose: Nose normal.  ?   Mouth/Throat:  ?   Mouth: Mucous membranes are moist.  ?Eyes:  ?   General: No scleral icterus.    ?   Right eye: No discharge.     ?   Left eye: No discharge.  ?   Extraocular Movements: Extraocular movements intact.  ?Cardiovascular:  ?   Rate and Rhythm: Normal rate.  ?   Heart sounds: No murmur heard. ?  No friction rub. No gallop.  ?Pulmonary:  ?   Effort: Pulmonary effort is normal. No respiratory distress.  ?   Breath sounds: No stridor. No wheezing, rhonchi or rales.  ?Chest:  ?   Chest wall: No tenderness.  ?Skin: ?   General: Skin is warm and dry.  ?Neurological:  ?   General: No focal deficit present.  ?   Mental Status: She is alert and oriented to person, place, and time.  ?Psychiatric:     ?   Mood and Affect: Mood normal.     ?   Behavior: Behavior normal.  ? ?Results for orders placed or performed during the hospital encounter of 04/19/21 (from the past 24 hour(s))  ?POCT urinalysis dipstick     Status: Abnormal  ? Collection Time: 04/19/21  8:35 AM  ?Result Value Ref Range  ? Color, UA  straw (A) yellow  ? Clarity, UA cloudy (A) clear  ? Glucose, UA >=1,000 (A) negative mg/dL  ? Bilirubin, UA negative negative  ? Ketones, POC UA trace (5) (A) negative mg/dL  ? Spec Grav, UA 1.020 1.010 - 1.025  ? Bl

## 2021-04-19 NOTE — ED Triage Notes (Signed)
Pt presents with dysuria, hematuria and low back pain that began a few days ago ?

## 2021-04-19 NOTE — Discharge Instructions (Addendum)

## 2021-04-22 LAB — URINE CULTURE: Culture: >=100000 — AB

## 2021-05-04 ENCOUNTER — Other Ambulatory Visit (HOSPITAL_COMMUNITY): Payer: Self-pay

## 2021-05-13 ENCOUNTER — Other Ambulatory Visit (HOSPITAL_COMMUNITY): Payer: Self-pay

## 2021-05-13 MED ORDER — OZEMPIC (1 MG/DOSE) 4 MG/3ML ~~LOC~~ SOPN
1.0000 mg | PEN_INJECTOR | SUBCUTANEOUS | 0 refills | Status: DC
  Filled 2021-05-13: qty 3, 28d supply, fill #0
  Filled 2021-08-02: qty 3, 28d supply, fill #1

## 2021-05-17 DIAGNOSIS — E782 Mixed hyperlipidemia: Secondary | ICD-10-CM | POA: Diagnosis not present

## 2021-05-17 DIAGNOSIS — I10 Essential (primary) hypertension: Secondary | ICD-10-CM | POA: Diagnosis not present

## 2021-05-17 DIAGNOSIS — E1165 Type 2 diabetes mellitus with hyperglycemia: Secondary | ICD-10-CM | POA: Diagnosis not present

## 2021-05-27 ENCOUNTER — Ambulatory Visit: Payer: 59 | Admitting: Gastroenterology

## 2021-05-30 ENCOUNTER — Ambulatory Visit: Payer: 59 | Admitting: Gastroenterology

## 2021-06-10 ENCOUNTER — Other Ambulatory Visit (HOSPITAL_COMMUNITY): Payer: Self-pay

## 2021-06-10 DIAGNOSIS — Z803 Family history of malignant neoplasm of breast: Secondary | ICD-10-CM | POA: Diagnosis not present

## 2021-06-10 DIAGNOSIS — E1165 Type 2 diabetes mellitus with hyperglycemia: Secondary | ICD-10-CM | POA: Diagnosis not present

## 2021-06-10 DIAGNOSIS — J302 Other seasonal allergic rhinitis: Secondary | ICD-10-CM | POA: Diagnosis not present

## 2021-06-10 DIAGNOSIS — K760 Fatty (change of) liver, not elsewhere classified: Secondary | ICD-10-CM | POA: Diagnosis not present

## 2021-06-10 DIAGNOSIS — K219 Gastro-esophageal reflux disease without esophagitis: Secondary | ICD-10-CM | POA: Diagnosis not present

## 2021-06-10 DIAGNOSIS — I10 Essential (primary) hypertension: Secondary | ICD-10-CM | POA: Diagnosis not present

## 2021-06-10 DIAGNOSIS — E782 Mixed hyperlipidemia: Secondary | ICD-10-CM | POA: Diagnosis not present

## 2021-06-10 DIAGNOSIS — D751 Secondary polycythemia: Secondary | ICD-10-CM | POA: Diagnosis not present

## 2021-06-10 DIAGNOSIS — Z0001 Encounter for general adult medical examination with abnormal findings: Secondary | ICD-10-CM | POA: Diagnosis not present

## 2021-06-10 MED ORDER — ALLOPURINOL 300 MG PO TABS
300.0000 mg | ORAL_TABLET | Freq: Every day | ORAL | 2 refills | Status: DC
  Filled 2021-06-10: qty 90, 90d supply, fill #0
  Filled 2021-09-13: qty 90, 90d supply, fill #1
  Filled 2022-01-10: qty 90, 90d supply, fill #2

## 2021-06-10 MED ORDER — PANTOPRAZOLE SODIUM 40 MG PO TBEC
DELAYED_RELEASE_TABLET | ORAL | 2 refills | Status: DC
  Filled 2021-06-10: qty 180, 90d supply, fill #0
  Filled 2021-09-13: qty 180, 90d supply, fill #1
  Filled 2022-01-10: qty 180, 90d supply, fill #2

## 2021-06-10 MED ORDER — METFORMIN HCL ER 500 MG PO TB24
1000.0000 mg | ORAL_TABLET | Freq: Every day | ORAL | 2 refills | Status: DC
  Filled 2021-06-10: qty 180, 90d supply, fill #0
  Filled 2021-09-13: qty 180, 90d supply, fill #1
  Filled 2022-01-10: qty 180, 90d supply, fill #2

## 2021-06-10 MED ORDER — FARXIGA 10 MG PO TABS
ORAL_TABLET | ORAL | 2 refills | Status: DC
  Filled 2021-06-10: qty 90, 90d supply, fill #0

## 2021-06-10 MED ORDER — HYDROCHLOROTHIAZIDE 12.5 MG PO TABS
12.5000 mg | ORAL_TABLET | Freq: Every day | ORAL | 2 refills | Status: DC
  Filled 2021-06-10: qty 90, 90d supply, fill #0
  Filled 2021-09-13: qty 90, 90d supply, fill #1
  Filled 2022-01-10: qty 90, 90d supply, fill #2

## 2021-06-10 MED ORDER — FEXOFENADINE HCL 180 MG PO TABS
ORAL_TABLET | ORAL | 2 refills | Status: DC
  Filled 2021-06-10: qty 90, 90d supply, fill #0
  Filled 2021-09-13: qty 90, 90d supply, fill #1
  Filled 2022-01-10: qty 90, 90d supply, fill #2

## 2021-06-10 MED ORDER — PRAVASTATIN SODIUM 20 MG PO TABS
ORAL_TABLET | ORAL | 2 refills | Status: DC
  Filled 2021-06-10: qty 90, 90d supply, fill #0
  Filled 2021-09-13: qty 90, 90d supply, fill #1
  Filled 2022-01-10: qty 90, 90d supply, fill #2

## 2021-06-11 ENCOUNTER — Other Ambulatory Visit (HOSPITAL_COMMUNITY): Payer: Self-pay

## 2021-06-11 DIAGNOSIS — G4733 Obstructive sleep apnea (adult) (pediatric): Secondary | ICD-10-CM | POA: Diagnosis not present

## 2021-06-27 ENCOUNTER — Other Ambulatory Visit (HOSPITAL_COMMUNITY): Payer: Self-pay

## 2021-06-27 DIAGNOSIS — E1165 Type 2 diabetes mellitus with hyperglycemia: Secondary | ICD-10-CM | POA: Diagnosis not present

## 2021-06-27 DIAGNOSIS — I10 Essential (primary) hypertension: Secondary | ICD-10-CM | POA: Diagnosis not present

## 2021-06-27 DIAGNOSIS — E785 Hyperlipidemia, unspecified: Secondary | ICD-10-CM | POA: Diagnosis not present

## 2021-06-27 MED ORDER — GUARDIAN TRANSMITTER MISC
4 refills | Status: DC
  Filled 2021-06-27: qty 1, 90d supply, fill #0
  Filled 2021-09-13: qty 1, 90d supply, fill #1

## 2021-06-27 MED ORDER — FARXIGA 10 MG PO TABS
ORAL_TABLET | ORAL | 1 refills | Status: DC
  Filled 2021-06-27 – 2021-09-13 (×2): qty 90, 90d supply, fill #0
  Filled 2022-01-10: qty 90, 90d supply, fill #1

## 2021-06-27 MED ORDER — DEXCOM G6 SENSOR MISC
7 refills | Status: DC
  Filled 2021-06-27: qty 3, 30d supply, fill #0
  Filled 2021-08-02 – 2021-08-12 (×2): qty 3, 30d supply, fill #1
  Filled 2021-09-13: qty 3, 30d supply, fill #2

## 2021-06-27 MED ORDER — METFORMIN HCL ER 500 MG PO TB24
ORAL_TABLET | ORAL | 3 refills | Status: DC
  Filled 2021-06-27 – 2021-09-13 (×3): qty 180, 90d supply, fill #0

## 2021-06-27 MED ORDER — OZEMPIC (1 MG/DOSE) 4 MG/3ML ~~LOC~~ SOPN
PEN_INJECTOR | SUBCUTANEOUS | 3 refills | Status: DC
  Filled 2021-06-27: qty 9, 84d supply, fill #0
  Filled 2021-09-13: qty 3, 28d supply, fill #1
  Filled 2021-09-27: qty 9, 84d supply, fill #1

## 2021-06-29 ENCOUNTER — Encounter: Payer: Self-pay | Admitting: Emergency Medicine

## 2021-06-29 ENCOUNTER — Other Ambulatory Visit: Payer: Self-pay

## 2021-06-29 ENCOUNTER — Ambulatory Visit
Admission: EM | Admit: 2021-06-29 | Discharge: 2021-06-29 | Disposition: A | Discharge status: Home / Self Care | Payer: 59 | Attending: Nurse Practitioner | Admitting: Nurse Practitioner

## 2021-06-29 DIAGNOSIS — J4531 Mild persistent asthma with (acute) exacerbation: Secondary | ICD-10-CM

## 2021-06-29 DIAGNOSIS — J069 Acute upper respiratory infection, unspecified: Secondary | ICD-10-CM

## 2021-06-29 HISTORY — DX: Respiratory failure, unspecified, unspecified whether with hypoxia or hypercapnia: J96.90

## 2021-06-29 MED ORDER — PREDNISONE 20 MG PO TABS: 40.0000 mg | ORAL_TABLET | Freq: Every day | ORAL | 0 refills | Status: AC

## 2021-06-29 MED ORDER — PROMETHAZINE-DM 6.25-15 MG/5ML PO SYRP: 5.0000 mL | ORAL_SOLUTION | Freq: Four times a day (QID) | ORAL | 0 refills | Status: DC | PRN

## 2021-06-29 MED ORDER — AZITHROMYCIN 250 MG PO TABS: 250.0000 mg | ORAL_TABLET | Freq: Every day | ORAL | 0 refills | Status: DC

## 2021-06-29 NOTE — ED Provider Notes (Signed)
[facility]    CSN: [id number] Arrival date & time: 06/29/21  0865      History   Chief Complaint Chief Complaint  Patient presents with   Shortness of Breath   Cough    HPI Cheryl Black is a 36 y.o. female.    Shortness of Breath Associated symptoms: cough   Cough Associated symptoms: shortness of breath     Patient presents for respiratory symptoms that been present for the past 6 days.  She complains of shortness of breath, cough that is productive, wheezing, nasal congestion, sore throat, and runny nose.  Patient states that when her symptoms started she was given a prescription for prednisone that initially helped, but after completing the prescription her symptoms returned.  She endorses shortness of breath and wheezing with coughing and with activity. She has also been taking Mucinex, albuterol, and Sudafed.  She states that she does have underlying history of asthma, and has used her inhaler 5-6 times over the past 24 hours, but that her asthma is usually well controlled.  Patient has a history of diabetes that she states is well controlled.  Past Medical History:  Diagnosis Date   Acid reflux    Asthma    Clostridium difficile infection    in the past   Diabetes mellitus    Fatty liver    Fibrocystic breast disease    Gout    Hidradenitis    History of kidney stones    Hyperlipemia    Obesity    Polycystic ovary syndrome    PONV (postoperative nausea and vomiting)    Respiratory failure (HCC)    "2018 and 2019"   Sleep apnea    Smoker     Patient Active Problem List   Diagnosis Date Noted   Hydrosalpinx 01/31/2021   Amenorrhea, secondary 01/31/2021   PCOS (polycystic ovarian syndrome) 10/04/2020   Encounter for gynecological examination with Papanicolaou smear of cervix 10/04/2020   Encounter for IUD removal 10/04/2020   DUB (dysfunctional uterine bleeding) 10/04/2020   GERD (gastroesophageal reflux disease) 12/10/2018   Acute  respiratory failure with hypoxia (HCC) 03/18/2018   Uncontrolled type 2 diabetes mellitus with hyperglycemia (HCC) 03/18/2018   Respiratory distress    Thickened endometrium 01/28/2018   Endometrial mass 01/28/2018   Abdominal pain 10/09/2017   Nausea & vomiting 09/24/2017   DOE (dyspnea on exertion) 09/16/2017   SOB (shortness of breath) 08/25/2017   Bronchospasm, acute 08/25/2017   Acute respiratory failure with hypoxemia (HCC) 08/25/2017   C. difficile enteritis 02/01/2012   Hyponatremia 01/07/2011   Hypokalemia 01/07/2011   Elevated LFTs 01/07/2011   Hypomagnesemia 01/07/2011   Diabetes mellitus type 2 in obese (HCC) 01/06/2011   Morbid obesity (HCC) 01/06/2011   Polycystic ovary disease 01/06/2011   Tobacco abuse 01/06/2011   Steatohepatitis 06/08/2008   RUQ pain 06/08/2008   TRANSAMINASES, SERUM, ELEVATED 06/08/2008    Past Surgical History:  Procedure Laterality Date   CHOLECYSTECTOMY  01/10/2011   Procedure: LAPAROSCOPIC CHOLECYSTECTOMY;  Surgeon: Dalia Heading;  Location: [facility];  Service: General;  Laterality: N/A;   ESOPHAGOGASTRODUODENOSCOPY (EGD) WITH PROPOFOL N/A 09/09/2018   RMR: Mild erosive reflux esophagitis.  Esophagus dilated for history of dysphagia.   HYDRADENITIS EXCISION Right 06/18/2012   Procedure: EXCISION HYDRIADENITIS SUPRATIVA  RIGHT AXILLA;  Surgeon: Marlane Hatcher, MD;  Location: [facility];  Service: General;  Laterality: Right;   LIVER BIOPSY  01/10/2011   Procedure: LIVER BIOPSY;  Surgeon: Redge Gainer  Lovell Sheehan;  Location: [facility];  Service: General;;   MALONEY DILATION N/A 09/09/2018   Procedure: Elease Hashimoto DILATION;  Surgeon: Corbin Ade, MD;  Location: [facility];  Service: Endoscopy;  Laterality: N/A;   SKIN SPLIT GRAFT Right 06/18/2012   Procedure: SKIN GRAFT SPLIT THICKNESS RIGHT AXILLA(WITH TWO STANDARD SKIN BOARDS 3"x 8" )  DONOR SITE RIGHT AND LEFT THIGHS;  Surgeon: Marlane Hatcher, MD;  Location: [facility];  Service: General;  Laterality: Right;     OB History     Gravida  0   Para  0   Term  0   Preterm  0   AB  0   Living  0      SAB  0   IAB  0   Ectopic  0   Multiple  0   Live Births  0            Home Medications    Prior to Admission medications   Medication Sig Start Date End Date Taking? Authorizing Provider  albuterol (VENTOLIN HFA) 108 (90 Base) MCG/ACT inhaler INHALE 2 PUFFS BY MOUTH EVERY 4 TO 6 HOURS AS NEEDED FOR SHORTNESS OF BREATH 11/12/20  Yes   allopurinol (ZYLOPRIM) 300 MG tablet TAKE 1 TABLET BY MOUTH ONCE DAILY 11/12/20  Yes   azithromycin (ZITHROMAX) 250 MG tablet Take 1 tablet (250 mg total) by mouth daily. Take first 2 tablets together, then 1 every day until finished. 06/29/21  Yes Leath-Warren, Sadie Haber, NP  dapagliflozin propanediol (FARXIGA) 10 MG TABS tablet Take 1 tablet by mouth once a day 06/27/21  Yes   fexofenadine (ALLEGRA ALLERGY) 180 MG tablet Take 1 tablet by mouth daily 11/12/20  Yes   fluticasone (FLONASE) 50 MCG/ACT nasal spray Place 2 sprays into both nostrils daily.    Yes [provider]  hydrochlorothiazide (HYDRODIURIL) 12.5 MG tablet TAKE 1 TABLET BY MOUTH DAILY FOR HIGH BLOOD PRESSURE 11/12/20  Yes   metFORMIN (GLUCOPHAGE-XR) 500 MG 24 hr tablet Take 2 tablets by mouth once daily Patient taking differently: Take 1,000 mg by mouth. 06/21/20  Yes   pantoprazole (PROTONIX) 40 MG tablet Take 1 tablet by mouth twice a day 11/12/20  Yes   pravastatin (PRAVACHOL) 20 MG tablet TAKE 1 TABLET BY MOUTH NIGHTLY FOR CHOLESTROL 11/12/20  Yes   predniSONE (DELTASONE) 20 MG tablet Take 2 tablets (40 mg total) by mouth daily with breakfast for 5 days. 06/29/21 07/04/21 Yes Leath-Warren, Sadie Haber, NP  Probiotic, Lactobacillus, CAPS  11/28/19  Yes [provider]  promethazine-dextromethorphan (PROMETHAZINE-DM) 6.25-15 MG/5ML syrup Take 5 mLs by mouth 4 (four) times daily as needed for cough. 06/29/21  Yes Leath-Warren, Sadie Haber, NP  Semaglutide, 1 MG/DOSE,  (OZEMPIC, 1 MG/DOSE,) 4 MG/3ML SOPN Inject 1 mg into the skin once a week 05/13/21  Yes   acetaminophen (TYLENOL) 500 MG tablet Take 1,000 mg by mouth every 6 (six) hours as needed (for pain/headaches.).     [provider]  allopurinol (ZYLOPRIM) 300 MG tablet TAKE 1 TABLET BY MOUTH ONCE DAILY 06/10/21     butalbital-acetaminophen-caffeine (FIORICET, ESGIC) 50-325-40 MG tablet Take 1 tablet by mouth daily as needed (headaches.). 01/21/18   [provider]  cephALEXin (KEFLEX) 500 MG capsule Take 1 capsule (500 mg total) by mouth 2 (two) times daily. 04/19/21   Wallis Bamberg, PA-C  cetirizine-pseudoephedrine (ZYRTEC-D) 5-120 MG tablet Take 1 tablet by mouth daily. 06/28/20   Wurst, [place], PA-C  Continuous Blood Gluc Sensor (  DEXCOM G6 SENSOR) MISC CHANGE SENSOR EVERY 10 DAYS. 12/25/20     Continuous Blood Gluc Sensor (DEXCOM G6 SENSOR) MISC Use as directed change every 10 days 06/27/21     Continuous Blood Gluc Transmit (DEXCOM G6 TRANSMITTER) MISC Use as directed every 10 days 12/19/20     Continuous Blood Gluc Transmit (DEXCOM G6 TRANSMITTER) MISC Use as directed with Dexcom sensor, replace every 90 days 02/06/21     Continuous Blood Gluc Transmit (GUARDIAN TRANSMITTER) MISC Use as directed 06/27/21     dapagliflozin propanediol (FARXIGA) 10 MG TABS tablet Take 1 tablet by mouth once daily 06/10/21     fexofenadine (ALLEGRA) 180 MG tablet Take 1 tablet by mouth once every day 06/10/21     hydrochlorothiazide (HYDRODIURIL) 12.5 MG tablet TAKE 1 TABLET BY MOUTH DAILY FOR HIGH BLOOD PRESSURE 06/10/21     metFORMIN (GLUCOPHAGE-XR) 500 MG 24 hr tablet TAKE 2 TABLETS BY MOUTH ONCE DAILY 06/10/21     metFORMIN (GLUCOPHAGE-XR) 500 MG 24 hr tablet Take 2 tablets by mouth once a day 06/27/21     metoCLOPramide (REGLAN) 10 MG tablet Take 1 tablet (10 mg total) by mouth every 6 (six) hours as needed for nausea or vomiting. 01/31/21   Horton, Mayer Masker, MD  metoCLOPramide (REGLAN) 10 MG tablet Take 1 tablet by  mouth every 6 (six) hours. 02/08/21     pantoprazole (PROTONIX) 40 MG tablet Take 1 tablet by mouth twice a day as directed. 06/10/21     potassium chloride SA (KLOR-CON M) 20 MEQ tablet Take 2 tablets (40 mEq total) by mouth daily. 01/31/21   Horton, Mayer Masker, MD  pravastatin (PRAVACHOL) 20 MG tablet Take 1 tablet by mouth once every night for cholesterol 06/10/21     promethazine (PHENERGAN) 25 MG tablet Take 1 tablet (25 mg total) by mouth every 6 (six) hours as needed for nausea or vomiting. 01/31/21   Adline Potter, NP  Semaglutide, 1 MG/DOSE, (OZEMPIC, 1 MG/DOSE,) 4 MG/3ML SOPN Use as directed into the skin Once a week 90 days 06/27/21       Family History Family History  Problem Relation Age of Onset   Stroke Other        paternal great grandfather   Diabetes Father    Hypertension Father    Cancer Maternal Grandfather        adenocarcinoma   Emphysema Maternal Grandfather        smoked   Heart attack Paternal Grandfather    Hypertension Paternal Grandmother    Diabetes Paternal Grandmother    Gout Paternal Grandmother    Hypertension Maternal Grandmother    Other Mother        pre-cancerous cells; had hyst   Emphysema Mother        smoker   Gout Brother    Cancer Sister        cervical; had hyst   Asthma Maternal Uncle    Colon cancer Neg Hx    Liver disease Neg Hx    Liver cancer Neg Hx     Social History Social History   Tobacco Use   Smoking status: Former    Packs/day: 1.00    Years: 10.00    Total pack years: 10.00    Types: Cigarettes    Quit date: 08/23/2017    Years since quitting: 3.8   Smokeless tobacco: Never  Vaping Use   Vaping Use: Never used  Substance Use Topics   Alcohol use: Not Currently  Comment: occas   Drug use: No     Allergies   Ace inhibitors, Beta adrenergic blockers, Augmentin [amoxicillin-pot clavulanate], Benicar [olmesartan], Doxycycline, Eggs or egg-derived products, Influenza vaccine live, Influenza virus vaccine,  Invokana [canagliflozin], and Liraglutide   Review of Systems Review of Systems  Respiratory:  Positive for cough and shortness of breath.    Per HPI  Physical Exam Triage Vital Signs ED Triage Vitals  Enc Vitals Group     BP 06/29/21 0853 119/84     Pulse Rate 06/29/21 0853 (!) 58     Resp 06/29/21 0853 14     Temp 06/29/21 0853 97.8 F (36.6 C)     Temp Source 06/29/21 0853 Oral     SpO2 06/29/21 0853 96 %     Weight --      Height --      Head Circumference --      Peak Flow --      Pain Score 06/29/21 0858 6     Pain Loc --      Pain Edu? --      Excl. in GC? --    No data found.  Updated Vital Signs BP 119/84 (BP Location: Right Arm)   Pulse (!) 58   Temp 97.8 F (36.6 C) (Oral)   Resp 14   LMP  (LMP Unknown)   SpO2 96%   Visual Acuity Right Eye Distance:   Left Eye Distance:   Bilateral Distance:    Right Eye Near:   Left Eye Near:    Bilateral Near:     Physical Exam Vitals and nursing note reviewed.  Constitutional:      General: She is not in acute distress.    Appearance: She is well-developed.  HENT:     Head: Normocephalic.     Right Ear: Tympanic membrane, ear canal and external ear normal.     Left Ear: Tympanic membrane, ear canal and external ear normal.     Nose: Congestion present.     Mouth/Throat:     Mouth: Mucous membranes are moist.     Pharynx: Posterior oropharyngeal erythema present. No oropharyngeal exudate.  Eyes:     Extraocular Movements: Extraocular movements intact.     Conjunctiva/sclera: Conjunctivae normal.     Pupils: Pupils are equal, round, and reactive to light.  Cardiovascular:     Rate and Rhythm: Normal rate and regular rhythm.  Pulmonary:     Effort: Pulmonary effort is normal.     Breath sounds: Normal breath sounds. No wheezing, rhonchi or rales.  Musculoskeletal:     Cervical back: Normal range of motion.  Lymphadenopathy:     Cervical: No cervical adenopathy.  Skin:    General: Skin is warm and  dry.  Neurological:     General: No focal deficit present.     Mental Status: She is alert and oriented to person, place, and time.  Psychiatric:        Mood and Affect: Mood normal.        Behavior: Behavior normal.      UC Treatments / Results  Labs (all labs ordered are listed, but only abnormal results are displayed) Labs Reviewed - No data to display  EKG   Radiology No results found.  Procedures Procedures (including critical care time)  Medications Ordered in UC Medications - No data to display  Initial Impression / Assessment and Plan / UC Course  I have reviewed the triage vital signs and the  nursing notes.  Pertinent labs & imaging results that were available during my care of the patient were reviewed by me and considered in my medical decision making (see chart for details).  She presents with respiratory symptoms that been present for the past 6 days.  On exam, patient's lung sounds are clear throughout, she has no wheezing.  She does have moderate nasal congestion.  COVID/flu testing is not warranted given the duration of her symptoms.  Will start patient on azithromycin for 5 days.  We will also prescribe another 5 days of prednisone and Promethazine DM to help with her cough at bedtime.  Patient was advised to continue use of the albuterol inhaler as needed for wheezing or shortness of breath.  Patient was also advised to continue her current allergy medication regimen.  Supportive care recommendations were provided to the patient.  Patient advised to follow-up if her symptoms do not improve after this course of treatment.  Would consider chest x-ray at that time if her symptoms continue to persist. Final Clinical Impressions(s) / UC Diagnoses   Final diagnoses:  Acute upper respiratory infection  Mild persistent asthma with acute exacerbation     Discharge Instructions      Take medication as directed. Continue your current allergy medication  regimen. Increase fluids and get plenty of rest. May take over-the-counter ibuprofen or Tylenol as needed for pain, fever, or general discomfort. Recommend normal saline nasal spray to help with nasal congestion throughout the day. For your cough, it may be helpful to use a humidifier at bedtime during sleep. If your symptoms fail to improve within the next 7 to 10 days, please follow-up in our clinic or with your PCP.      ED Prescriptions     Medication Sig Dispense Auth. Provider   predniSONE (DELTASONE) 20 MG tablet Take 2 tablets (40 mg total) by mouth daily with breakfast for 5 days. 10 tablet Leath-Warren, Sadie Haber, NP   promethazine-dextromethorphan (PROMETHAZINE-DM) 6.25-15 MG/5ML syrup Take 5 mLs by mouth 4 (four) times daily as needed for cough. 140 mL Leath-Warren, Sadie Haber, NP   azithromycin (ZITHROMAX) 250 MG tablet Take 1 tablet (250 mg total) by mouth daily. Take first 2 tablets together, then 1 every day until finished. 6 tablet Leath-Warren, Sadie Haber, NP      PDMP not reviewed this encounter.   Abran Cantor, NP 06/29/21 [id number]

## 2021-07-18 ENCOUNTER — Other Ambulatory Visit (HOSPITAL_COMMUNITY): Payer: Self-pay

## 2021-07-22 ENCOUNTER — Other Ambulatory Visit (HOSPITAL_COMMUNITY): Payer: Self-pay

## 2021-08-02 ENCOUNTER — Other Ambulatory Visit (HOSPITAL_COMMUNITY): Payer: Self-pay

## 2021-08-02 MED ORDER — METOCLOPRAMIDE HCL 10 MG PO TABS
ORAL_TABLET | ORAL | 0 refills | Status: DC
  Filled 2021-08-02: qty 120, 30d supply, fill #0

## 2021-08-12 ENCOUNTER — Other Ambulatory Visit (HOSPITAL_COMMUNITY): Payer: Self-pay

## 2021-08-14 ENCOUNTER — Other Ambulatory Visit (HOSPITAL_COMMUNITY): Payer: Self-pay

## 2021-08-19 DIAGNOSIS — M62830 Muscle spasm of back: Secondary | ICD-10-CM | POA: Diagnosis not present

## 2021-08-19 DIAGNOSIS — M9902 Segmental and somatic dysfunction of thoracic region: Secondary | ICD-10-CM | POA: Diagnosis not present

## 2021-08-19 DIAGNOSIS — M9903 Segmental and somatic dysfunction of lumbar region: Secondary | ICD-10-CM | POA: Diagnosis not present

## 2021-08-19 DIAGNOSIS — M9905 Segmental and somatic dysfunction of pelvic region: Secondary | ICD-10-CM | POA: Diagnosis not present

## 2021-08-23 DIAGNOSIS — M9903 Segmental and somatic dysfunction of lumbar region: Secondary | ICD-10-CM | POA: Diagnosis not present

## 2021-08-23 DIAGNOSIS — M62830 Muscle spasm of back: Secondary | ICD-10-CM | POA: Diagnosis not present

## 2021-08-23 DIAGNOSIS — M9905 Segmental and somatic dysfunction of pelvic region: Secondary | ICD-10-CM | POA: Diagnosis not present

## 2021-08-23 DIAGNOSIS — M9902 Segmental and somatic dysfunction of thoracic region: Secondary | ICD-10-CM | POA: Diagnosis not present

## 2021-08-26 DIAGNOSIS — M62830 Muscle spasm of back: Secondary | ICD-10-CM | POA: Diagnosis not present

## 2021-08-26 DIAGNOSIS — M9903 Segmental and somatic dysfunction of lumbar region: Secondary | ICD-10-CM | POA: Diagnosis not present

## 2021-08-26 DIAGNOSIS — M9905 Segmental and somatic dysfunction of pelvic region: Secondary | ICD-10-CM | POA: Diagnosis not present

## 2021-08-26 DIAGNOSIS — M9902 Segmental and somatic dysfunction of thoracic region: Secondary | ICD-10-CM | POA: Diagnosis not present

## 2021-08-30 DIAGNOSIS — M62830 Muscle spasm of back: Secondary | ICD-10-CM | POA: Diagnosis not present

## 2021-08-30 DIAGNOSIS — M9903 Segmental and somatic dysfunction of lumbar region: Secondary | ICD-10-CM | POA: Diagnosis not present

## 2021-08-30 DIAGNOSIS — M9902 Segmental and somatic dysfunction of thoracic region: Secondary | ICD-10-CM | POA: Diagnosis not present

## 2021-08-30 DIAGNOSIS — M9905 Segmental and somatic dysfunction of pelvic region: Secondary | ICD-10-CM | POA: Diagnosis not present

## 2021-09-03 DIAGNOSIS — G4733 Obstructive sleep apnea (adult) (pediatric): Secondary | ICD-10-CM | POA: Diagnosis not present

## 2021-09-06 DIAGNOSIS — M9905 Segmental and somatic dysfunction of pelvic region: Secondary | ICD-10-CM | POA: Diagnosis not present

## 2021-09-06 DIAGNOSIS — M9902 Segmental and somatic dysfunction of thoracic region: Secondary | ICD-10-CM | POA: Diagnosis not present

## 2021-09-06 DIAGNOSIS — M9903 Segmental and somatic dysfunction of lumbar region: Secondary | ICD-10-CM | POA: Diagnosis not present

## 2021-09-06 DIAGNOSIS — M62830 Muscle spasm of back: Secondary | ICD-10-CM | POA: Diagnosis not present

## 2021-09-13 ENCOUNTER — Other Ambulatory Visit (HOSPITAL_COMMUNITY): Payer: Self-pay

## 2021-09-13 DIAGNOSIS — M62830 Muscle spasm of back: Secondary | ICD-10-CM | POA: Diagnosis not present

## 2021-09-13 DIAGNOSIS — M9902 Segmental and somatic dysfunction of thoracic region: Secondary | ICD-10-CM | POA: Diagnosis not present

## 2021-09-13 DIAGNOSIS — M9905 Segmental and somatic dysfunction of pelvic region: Secondary | ICD-10-CM | POA: Diagnosis not present

## 2021-09-13 DIAGNOSIS — M9903 Segmental and somatic dysfunction of lumbar region: Secondary | ICD-10-CM | POA: Diagnosis not present

## 2021-09-20 ENCOUNTER — Other Ambulatory Visit (HOSPITAL_COMMUNITY): Payer: Self-pay

## 2021-09-20 DIAGNOSIS — M9905 Segmental and somatic dysfunction of pelvic region: Secondary | ICD-10-CM | POA: Diagnosis not present

## 2021-09-20 DIAGNOSIS — M9902 Segmental and somatic dysfunction of thoracic region: Secondary | ICD-10-CM | POA: Diagnosis not present

## 2021-09-20 DIAGNOSIS — M9903 Segmental and somatic dysfunction of lumbar region: Secondary | ICD-10-CM | POA: Diagnosis not present

## 2021-09-20 DIAGNOSIS — M62830 Muscle spasm of back: Secondary | ICD-10-CM | POA: Diagnosis not present

## 2021-09-21 ENCOUNTER — Other Ambulatory Visit (HOSPITAL_COMMUNITY): Payer: Self-pay

## 2021-09-27 ENCOUNTER — Other Ambulatory Visit (HOSPITAL_COMMUNITY): Payer: Self-pay

## 2021-09-27 DIAGNOSIS — M62830 Muscle spasm of back: Secondary | ICD-10-CM | POA: Diagnosis not present

## 2021-09-27 DIAGNOSIS — M9902 Segmental and somatic dysfunction of thoracic region: Secondary | ICD-10-CM | POA: Diagnosis not present

## 2021-09-27 DIAGNOSIS — M9905 Segmental and somatic dysfunction of pelvic region: Secondary | ICD-10-CM | POA: Diagnosis not present

## 2021-09-27 DIAGNOSIS — M9903 Segmental and somatic dysfunction of lumbar region: Secondary | ICD-10-CM | POA: Diagnosis not present

## 2021-09-30 ENCOUNTER — Other Ambulatory Visit (HOSPITAL_COMMUNITY): Payer: Self-pay

## 2021-11-22 ENCOUNTER — Other Ambulatory Visit (HOSPITAL_COMMUNITY): Payer: Self-pay

## 2021-11-25 ENCOUNTER — Other Ambulatory Visit (HOSPITAL_COMMUNITY): Payer: Self-pay

## 2021-11-29 DIAGNOSIS — G4733 Obstructive sleep apnea (adult) (pediatric): Secondary | ICD-10-CM | POA: Diagnosis not present

## 2021-12-06 ENCOUNTER — Other Ambulatory Visit (HOSPITAL_COMMUNITY): Payer: Self-pay

## 2021-12-06 DIAGNOSIS — E785 Hyperlipidemia, unspecified: Secondary | ICD-10-CM | POA: Diagnosis not present

## 2021-12-06 DIAGNOSIS — E1165 Type 2 diabetes mellitus with hyperglycemia: Secondary | ICD-10-CM | POA: Diagnosis not present

## 2021-12-06 DIAGNOSIS — I10 Essential (primary) hypertension: Secondary | ICD-10-CM | POA: Diagnosis not present

## 2021-12-06 MED ORDER — DEXCOM G7 SENSOR MISC
11 refills | Status: DC
  Filled 2021-12-06 – 2022-01-10 (×3): qty 3, 30d supply, fill #0
  Filled 2022-01-31: qty 3, 30d supply, fill #1
  Filled 2022-03-06: qty 3, 30d supply, fill #2
  Filled 2022-04-05: qty 3, 30d supply, fill #3
  Filled 2022-05-04: qty 3, 30d supply, fill #4
  Filled 2022-06-01: qty 3, 30d supply, fill #5
  Filled 2022-07-02: qty 3, 30d supply, fill #6
  Filled 2022-07-29: qty 3, 30d supply, fill #7
  Filled 2022-08-27: qty 3, 30d supply, fill #8
  Filled 2022-09-27: qty 3, 30d supply, fill #9
  Filled 2022-10-22 – 2022-10-23 (×2): qty 3, 30d supply, fill #10
  Filled 2022-11-21 – 2022-11-26 (×2): qty 3, 30d supply, fill #11

## 2021-12-09 DIAGNOSIS — I10 Essential (primary) hypertension: Secondary | ICD-10-CM | POA: Diagnosis not present

## 2021-12-09 DIAGNOSIS — E1165 Type 2 diabetes mellitus with hyperglycemia: Secondary | ICD-10-CM | POA: Diagnosis not present

## 2021-12-09 DIAGNOSIS — E782 Mixed hyperlipidemia: Secondary | ICD-10-CM | POA: Diagnosis not present

## 2021-12-11 ENCOUNTER — Ambulatory Visit
Admission: EM | Admit: 2021-12-11 | Discharge: 2021-12-11 | Disposition: A | Discharge status: Home / Self Care | Payer: 59 | Attending: Family Medicine | Admitting: Family Medicine

## 2021-12-11 ENCOUNTER — Encounter: Payer: Self-pay | Admitting: Emergency Medicine

## 2021-12-11 DIAGNOSIS — R059 Cough, unspecified: Secondary | ICD-10-CM | POA: Diagnosis not present

## 2021-12-11 DIAGNOSIS — Z79899 Other long term (current) drug therapy: Secondary | ICD-10-CM | POA: Diagnosis not present

## 2021-12-11 DIAGNOSIS — J069 Acute upper respiratory infection, unspecified: Secondary | ICD-10-CM | POA: Diagnosis not present

## 2021-12-11 DIAGNOSIS — Z1152 Encounter for screening for COVID-19: Secondary | ICD-10-CM | POA: Insufficient documentation

## 2021-12-11 LAB — RESP PANEL BY RT-PCR (FLU A&B, COVID) ARPGX2
Influenza A by PCR: NEGATIVE
Influenza B by PCR: NEGATIVE
SARS Coronavirus 2 by RT PCR: NEGATIVE

## 2021-12-11 MED ORDER — OSELTAMIVIR PHOSPHATE 75 MG PO CAPS: 75.0000 mg | ORAL_CAPSULE | Freq: Two times a day (BID) | ORAL | 0 refills | Status: DC

## 2021-12-11 MED ORDER — PROMETHAZINE-DM 6.25-15 MG/5ML PO SYRP: 5.0000 mL | ORAL_SOLUTION | Freq: Four times a day (QID) | ORAL | 0 refills | Status: DC | PRN

## 2021-12-11 NOTE — ED Triage Notes (Signed)
Sore throat that started Saturday.  body aches and fever with sinus pressure that started last night.

## 2021-12-11 NOTE — ED Provider Notes (Signed)
[facility]    CSN: [id number] Arrival date & time: 12/11/21  1607      History   Chief Complaint No chief complaint on file.   HPI Cheryl Black is a 36 y.o. female.   Patient presenting today with 4-day history of sore throat, fever, chills, body aches, congestion, cough, headaches.  Denies chest pain, shortness of breath, abdominal pain, nausea vomiting or diarrhea.  So far trying over-the-counter cold and congestion medications minimal relief.  Numerous sick contacts with influenza at work recently.    Past Medical History:  Diagnosis Date   Acid reflux    Asthma    Clostridium difficile infection    in the past   Diabetes mellitus    Fatty liver    Fibrocystic breast disease    Gout    Hidradenitis    History of kidney stones    Hyperlipemia    Obesity    Polycystic ovary syndrome    PONV (postoperative nausea and vomiting)    Respiratory failure ([place])    "2018 and 2019"   Sleep apnea    Smoker     Patient Active Problem List   Diagnosis Date Noted   Hydrosalpinx 01/31/2021   Amenorrhea, secondary 01/31/2021   PCOS (polycystic ovarian syndrome) 10/04/2020   Encounter for gynecological examination with Papanicolaou smear of cervix 10/04/2020   Encounter for IUD removal 10/04/2020   DUB (dysfunctional uterine bleeding) 10/04/2020   GERD (gastroesophageal reflux disease) 12/10/2018   Acute respiratory failure with hypoxia ([place]) 03/18/2018   Uncontrolled type 2 diabetes mellitus with hyperglycemia ([place]) 03/18/2018   Respiratory distress    Thickened endometrium 01/28/2018   Endometrial mass 01/28/2018   Abdominal pain 10/09/2017   Nausea & vomiting 09/24/2017   DOE (dyspnea on exertion) 09/16/2017   SOB (shortness of breath) 08/25/2017   Bronchospasm, acute 08/25/2017   Acute respiratory failure with hypoxemia ([place]) 08/25/2017   C. difficile enteritis 02/01/2012   Hyponatremia 01/07/2011   Hypokalemia 01/07/2011   Elevated LFTs  01/07/2011   Hypomagnesemia 01/07/2011   Diabetes mellitus type 2 in obese ([place]) 01/06/2011   Morbid obesity ([place]) 01/06/2011   Polycystic ovary disease 01/06/2011   Tobacco abuse 01/06/2011   Steatohepatitis 06/08/2008   RUQ pain 06/08/2008   TRANSAMINASES, SERUM, ELEVATED 06/08/2008    Past Surgical History:  Procedure Laterality Date   CHOLECYSTECTOMY  01/10/2011   Procedure: LAPAROSCOPIC CHOLECYSTECTOMY;  Surgeon: Jamesetta So;  Location: [facility];  Service: General;  Laterality: N/A;   ESOPHAGOGASTRODUODENOSCOPY (EGD) WITH PROPOFOL N/A 09/09/2018   RMR: Mild erosive reflux esophagitis.  Esophagus dilated for history of dysphagia.   HYDRADENITIS EXCISION Right 06/18/2012   Procedure: EXCISION HYDRIADENITIS SUPRATIVA  RIGHT AXILLA;  Surgeon: Scherry Ran, MD;  Location: [facility];  Service: General;  Laterality: Right;   LIVER BIOPSY  01/10/2011   Procedure: LIVER BIOPSY;  Surgeon: Jamesetta So;  Location: [facility];  Service: General;;   MALONEY DILATION N/A 09/09/2018   Procedure: Venia Minks DILATION;  Surgeon: Daneil Dolin, MD;  Location: [facility];  Service: Endoscopy;  Laterality: N/A;   SKIN SPLIT GRAFT Right 06/18/2012   Procedure: SKIN GRAFT SPLIT THICKNESS RIGHT AXILLA(WITH TWO STANDARD SKIN BOARDS 3"x 8" )  DONOR SITE RIGHT AND LEFT THIGHS;  Surgeon: Scherry Ran, MD;  Location: [facility];  Service: General;  Laterality: Right;    OB History     Gravida  0   Para  0   Term  0   Preterm  0   AB  0   Living  0      SAB  0   IAB  0   Ectopic  0   Multiple  0   Live Births  0            Home Medications    Prior to Admission medications   Medication Sig Start Date End Date Taking? Authorizing Provider  oseltamivir (TAMIFLU) 75 MG capsule Take 1 capsule (75 mg total) by mouth every 12 (twelve) hours. 12/11/21  Yes Volney American, PA-C  promethazine-dextromethorphan (PROMETHAZINE-DM) 6.25-15 MG/5ML syrup Take 5 mLs by mouth 4 (four) times  daily as needed. 12/11/21  Yes Volney American, PA-C  acetaminophen (TYLENOL) 500 MG tablet Take 1,000 mg by mouth every 6 (six) hours as needed (for pain/headaches.).     [provider]  albuterol (VENTOLIN HFA) 108 (90 Base) MCG/ACT inhaler INHALE 2 PUFFS BY MOUTH EVERY 4 TO 6 HOURS AS NEEDED FOR SHORTNESS OF BREATH 11/12/20     allopurinol (ZYLOPRIM) 300 MG tablet TAKE 1 TABLET BY MOUTH ONCE DAILY 11/12/20     allopurinol (ZYLOPRIM) 300 MG tablet TAKE 1 TABLET BY MOUTH ONCE DAILY 06/10/21     azithromycin (ZITHROMAX) 250 MG tablet Take 1 tablet (250 mg total) by mouth daily. Take first 2 tablets together, then 1 every day until finished. 06/29/21   Leath-Warren, Alda Lea, NP  butalbital-acetaminophen-caffeine (FIORICET, ESGIC) 50-325-40 MG tablet Take 1 tablet by mouth daily as needed (headaches.). 01/21/18   [provider]  cephALEXin (KEFLEX) 500 MG capsule Take 1 capsule (500 mg total) by mouth 2 (two) times daily. 04/19/21   Jaynee Eagles, PA-C  cetirizine-pseudoephedrine (ZYRTEC-D) 5-120 MG tablet Take 1 tablet by mouth daily. 06/28/20   Wurst, [place], PA-C  Continuous Blood Gluc Sensor (DEXCOM G6 SENSOR) MISC CHANGE SENSOR EVERY 10 DAYS. 12/25/20     Continuous Blood Gluc Sensor (DEXCOM G6 SENSOR) MISC Use as directed change every 10 days 06/27/21     Continuous Blood Gluc Sensor (DEXCOM G7 SENSOR) MISC Use as directed for blood glucose monitoring, replace every 10 days. 12/06/21     Continuous Blood Gluc Transmit (DEXCOM G6 TRANSMITTER) MISC Use as directed every 90 days 12/19/20     Continuous Blood Gluc Transmit (DEXCOM G6 TRANSMITTER) MISC Use as directed with Dexcom sensor, replace every 90 days 02/06/21     Continuous Blood Gluc Transmit (GUARDIAN TRANSMITTER) MISC Use as directed 06/27/21     dapagliflozin propanediol (FARXIGA) 10 MG TABS tablet Take 1 tablet by mouth once daily 06/10/21     dapagliflozin propanediol (FARXIGA) 10 MG TABS tablet Take 1 tablet by mouth once  a day 06/27/21     fexofenadine (ALLEGRA ALLERGY) 180 MG tablet Take 1 tablet by mouth daily 11/12/20     fexofenadine (ALLEGRA) 180 MG tablet Take 1 tablet by mouth once every day 06/10/21     fluticasone (FLONASE) 50 MCG/ACT nasal spray Place 2 sprays into both nostrils daily.     [provider]  hydrochlorothiazide (HYDRODIURIL) 12.5 MG tablet TAKE 1 TABLET BY MOUTH DAILY FOR HIGH BLOOD PRESSURE 11/12/20     hydrochlorothiazide (HYDRODIURIL) 12.5 MG tablet TAKE 1 TABLET BY MOUTH DAILY FOR HIGH BLOOD PRESSURE 06/10/21     metFORMIN (GLUCOPHAGE-XR) 500 MG 24 hr tablet Take 2 tablets by mouth once daily Patient taking differently: Take 1,000 mg by mouth. 06/21/20     metFORMIN (GLUCOPHAGE-XR) 500 MG 24  hr tablet TAKE 2 TABLETS BY MOUTH ONCE DAILY 06/10/21     metFORMIN (GLUCOPHAGE-XR) 500 MG 24 hr tablet Take 2 tablets by mouth once a day 06/27/21     metoCLOPramide (REGLAN) 10 MG tablet Take 1 tablet (10 mg total) by mouth every 6 (six) hours as needed for nausea or vomiting. 01/31/21   Horton, Barbette Hair, MD  metoCLOPramide (REGLAN) 10 MG tablet Take 1 tablet by mouth every 6 (six) hours. 08/02/21     pantoprazole (PROTONIX) 40 MG tablet Take 1 tablet by mouth twice a day 11/12/20     pantoprazole (PROTONIX) 40 MG tablet Take 1 tablet by mouth twice a day as directed. 06/10/21     potassium chloride SA (KLOR-CON M) 20 MEQ tablet Take 2 tablets (40 mEq total) by mouth daily. 01/31/21   Horton, Barbette Hair, MD  pravastatin (PRAVACHOL) 20 MG tablet TAKE 1 TABLET BY MOUTH NIGHTLY FOR CHOLESTROL 11/12/20     pravastatin (PRAVACHOL) 20 MG tablet Take 1 tablet by mouth once every night for cholesterol 06/10/21     Probiotic, Lactobacillus, CAPS  11/28/19   [provider]  promethazine (PHENERGAN) 25 MG tablet Take 1 tablet (25 mg total) by mouth every 6 (six) hours as needed for nausea or vomiting. 01/31/21   Estill Dooms, NP  promethazine-dextromethorphan (PROMETHAZINE-DM) 6.25-15 MG/5ML syrup  Take 5 mLs by mouth 4 (four) times daily as needed for cough. 06/29/21   Leath-Warren, Alda Lea, NP  Semaglutide, 1 MG/DOSE, (OZEMPIC, 1 MG/DOSE,) 4 MG/3ML SOPN Inject 1 mg into the skin once a week 05/13/21     Semaglutide, 1 MG/DOSE, (OZEMPIC, 1 MG/DOSE,) 4 MG/3ML SOPN Use as directed into the skin Once a week 06/27/21       Family History Family History  Problem Relation Age of Onset   Stroke Other        paternal great grandfather   Diabetes Father    Hypertension Father    Cancer Maternal Grandfather        adenocarcinoma   Emphysema Maternal Grandfather        smoked   Heart attack Paternal Grandfather    Hypertension Paternal Grandmother    Diabetes Paternal Grandmother    Gout Paternal Grandmother    Hypertension Maternal Grandmother    Other Mother        pre-cancerous cells; had hyst   Emphysema Mother        smoker   Gout Brother    Cancer Sister        cervical; had hyst   Asthma Maternal Uncle    Colon cancer Neg Hx    Liver disease Neg Hx    Liver cancer Neg Hx     Social History Social History   Tobacco Use   Smoking status: Former    Packs/day: 1.00    Years: 10.00    Total pack years: 10.00    Types: Cigarettes    Quit date: 08/23/2017    Years since quitting: 4.3   Smokeless tobacco: Never  Vaping Use   Vaping Use: Never used  Substance Use Topics   Alcohol use: Not Currently    Comment: occas   Drug use: No     Allergies   Ace inhibitors, Beta adrenergic blockers, Augmentin [amoxicillin-pot clavulanate], Benicar [olmesartan], Doxycycline, Eggs or egg-derived products, Influenza vaccine live, Influenza virus vaccine, Invokana [canagliflozin], and Liraglutide   Review of Systems Review of Systems HPI  Physical Exam Triage Vital Signs ED Triage  Vitals  Enc Vitals Group     BP 12/11/21 1744 (!) 137/93     Pulse Rate 12/11/21 1744 93     Resp 12/11/21 1744 18     Temp 12/11/21 1744 98 F (36.7 C)     Temp Source 12/11/21 1744 Oral      SpO2 12/11/21 1744 95 %     Weight --      Height --      Head Circumference --      Peak Flow --      Pain Score 12/11/21 1745 5     Pain Loc --      Pain Edu? --      Excl. in [place]? --    No data found.  Updated Vital Signs BP (!) 137/93 (BP Location: Right Arm)   Pulse 93   Temp 98 F (36.7 C) (Oral)   Resp 18   SpO2 95%   Visual Acuity Right Eye Distance:   Left Eye Distance:   Bilateral Distance:    Right Eye Near:   Left Eye Near:    Bilateral Near:     Physical Exam Vitals and nursing note reviewed.  Constitutional:      Appearance: Normal appearance.  HENT:     Head: Atraumatic.     Right Ear: Tympanic membrane and external ear normal.     Left Ear: Tympanic membrane and external ear normal.     Nose: Rhinorrhea present.     Mouth/Throat:     Mouth: Mucous membranes are moist.     Pharynx: Posterior oropharyngeal erythema present.  Eyes:     Extraocular Movements: Extraocular movements intact.     Conjunctiva/sclera: Conjunctivae normal.  Cardiovascular:     Rate and Rhythm: Normal rate and regular rhythm.     Heart sounds: Normal heart sounds.  Pulmonary:     Effort: Pulmonary effort is normal.     Breath sounds: Normal breath sounds. No wheezing or rales.  Musculoskeletal:        General: Normal range of motion.     Cervical back: Normal range of motion and neck supple.  Skin:    General: Skin is warm and dry.  Neurological:     Mental Status: She is alert and oriented to person, place, and time.  Psychiatric:        Mood and Affect: Mood normal.        Thought Content: Thought content normal.      UC Treatments / Results  Labs (all labs ordered are listed, but only abnormal results are displayed) Labs Reviewed  RESP PANEL BY RT-PCR (FLU A&B, COVID) [id number]    EKG   Radiology No results found.  Procedures Procedures (including critical care time)  Medications Ordered in UC Medications - No data to display  Initial Impression /  Assessment and Plan / UC Course  I have reviewed the triage vital signs and the nursing notes.  Pertinent labs & imaging results that were available during my care of the patient were reviewed by me and considered in my medical decision making (see chart for details).     Vital signs and exam overall reassuring today, suspect influenza-like illness.  Respiratory panel pending, start Tamiflu, Phenergan DM, support over-the-counter medications and home care.  Adjust if needed based on results.  Work note given.  Final Clinical Impressions(s) / UC Diagnoses   Final diagnoses:  Viral URI with cough   Discharge Instructions   None  ED Prescriptions     Medication Sig Dispense Auth. Provider   oseltamivir (TAMIFLU) 75 MG capsule Take 1 capsule (75 mg total) by mouth every 12 (twelve) hours. 10 capsule Volney American, [place]   promethazine-dextromethorphan (PROMETHAZINE-DM) 6.25-15 MG/5ML syrup Take 5 mLs by mouth 4 (four) times daily as needed. 100 mL Volney American, [place]      PDMP not reviewed this encounter.   Volney American, [place] 12/11/21 1830

## 2021-12-16 DIAGNOSIS — E782 Mixed hyperlipidemia: Secondary | ICD-10-CM | POA: Diagnosis not present

## 2021-12-16 DIAGNOSIS — K760 Fatty (change of) liver, not elsewhere classified: Secondary | ICD-10-CM | POA: Diagnosis not present

## 2021-12-16 DIAGNOSIS — I10 Essential (primary) hypertension: Secondary | ICD-10-CM | POA: Diagnosis not present

## 2021-12-16 DIAGNOSIS — E1165 Type 2 diabetes mellitus with hyperglycemia: Secondary | ICD-10-CM | POA: Diagnosis not present

## 2021-12-16 DIAGNOSIS — M109 Gout, unspecified: Secondary | ICD-10-CM | POA: Diagnosis not present

## 2021-12-16 DIAGNOSIS — K219 Gastro-esophageal reflux disease without esophagitis: Secondary | ICD-10-CM | POA: Diagnosis not present

## 2021-12-16 DIAGNOSIS — L659 Nonscarring hair loss, unspecified: Secondary | ICD-10-CM | POA: Diagnosis not present

## 2021-12-16 DIAGNOSIS — J302 Other seasonal allergic rhinitis: Secondary | ICD-10-CM | POA: Diagnosis not present

## 2021-12-16 DIAGNOSIS — Z803 Family history of malignant neoplasm of breast: Secondary | ICD-10-CM | POA: Diagnosis not present

## 2021-12-22 ENCOUNTER — Ambulatory Visit
Admission: EM | Admit: 2021-12-22 | Discharge: 2021-12-22 | Disposition: A | Discharge status: Home / Self Care | Payer: 59 | Attending: Family Medicine | Admitting: Family Medicine

## 2021-12-22 DIAGNOSIS — Z20822 Contact with and (suspected) exposure to covid-19: Secondary | ICD-10-CM | POA: Diagnosis not present

## 2021-12-22 DIAGNOSIS — J069 Acute upper respiratory infection, unspecified: Secondary | ICD-10-CM

## 2021-12-22 LAB — RESP PANEL BY RT-PCR (FLU A&B, COVID) ARPGX2
Influenza A by PCR: NEGATIVE
Influenza B by PCR: NEGATIVE
SARS Coronavirus 2 by RT PCR: NEGATIVE

## 2021-12-22 MED ORDER — FLUTICASONE PROPIONATE 50 MCG/ACT NA SUSP: 1.0000 | Freq: Two times a day (BID) | NASAL | 2 refills | Status: DC

## 2021-12-22 MED ORDER — PROMETHAZINE-DM 6.25-15 MG/5ML PO SYRP: 5.0000 mL | ORAL_SOLUTION | Freq: Four times a day (QID) | ORAL | 0 refills | Status: DC | PRN

## 2021-12-22 NOTE — ED Provider Notes (Signed)
[facility]    CSN: [id number] Arrival date & time: 12/22/21  0807      History   Chief Complaint Chief Complaint  Patient presents with   Cough   Headache   Nasal Congestion    HPI Cheryl Black is a 36 y.o. female.   Presenting today with 1 day history of cough, sneezing, sinus pressure, congestion, headache, fatigue.  Denies chest pain, shortness of breath, abdominal pain, nausea vomiting diarrhea, fever, body aches.  So far not trying anything over-the-counter for symptoms.  Recent exposure to COVID.  Spouse sick with similar symptoms.  Does have a history of asthma on albuterol as needed.    Past Medical History:  Diagnosis Date   Acid reflux    Asthma    Clostridium difficile infection    in the past   Diabetes mellitus    Fatty liver    Fibrocystic breast disease    Gout    Hidradenitis    History of kidney stones    Hyperlipemia    Obesity    Polycystic ovary syndrome    PONV (postoperative nausea and vomiting)    Respiratory failure ([place])    "2018 and 2019"   Sleep apnea    Smoker     Patient Active Problem List   Diagnosis Date Noted   Hydrosalpinx 01/31/2021   Amenorrhea, secondary 01/31/2021   PCOS (polycystic ovarian syndrome) 10/04/2020   Encounter for gynecological examination with Papanicolaou smear of cervix 10/04/2020   Encounter for IUD removal 10/04/2020   DUB (dysfunctional uterine bleeding) 10/04/2020   GERD (gastroesophageal reflux disease) 12/10/2018   Acute respiratory failure with hypoxia ([place]) 03/18/2018   Uncontrolled type 2 diabetes mellitus with hyperglycemia ([place]) 03/18/2018   Respiratory distress    Thickened endometrium 01/28/2018   Endometrial mass 01/28/2018   Abdominal pain 10/09/2017   Nausea & vomiting 09/24/2017   DOE (dyspnea on exertion) 09/16/2017   SOB (shortness of breath) 08/25/2017   Bronchospasm, acute 08/25/2017   Acute respiratory failure with hypoxemia ([place]) 08/25/2017   C. difficile  enteritis 02/01/2012   Hyponatremia 01/07/2011   Hypokalemia 01/07/2011   Elevated LFTs 01/07/2011   Hypomagnesemia 01/07/2011   Diabetes mellitus type 2 in obese ([place]) 01/06/2011   Morbid obesity ([place]) 01/06/2011   Polycystic ovary disease 01/06/2011   Tobacco abuse 01/06/2011   Steatohepatitis 06/08/2008   RUQ pain 06/08/2008   TRANSAMINASES, SERUM, ELEVATED 06/08/2008    Past Surgical History:  Procedure Laterality Date   CHOLECYSTECTOMY  01/10/2011   Procedure: LAPAROSCOPIC CHOLECYSTECTOMY;  Surgeon: Jamesetta So;  Location: [facility];  Service: General;  Laterality: N/A;   ESOPHAGOGASTRODUODENOSCOPY (EGD) WITH PROPOFOL N/A 09/09/2018   RMR: Mild erosive reflux esophagitis.  Esophagus dilated for history of dysphagia.   HYDRADENITIS EXCISION Right 06/18/2012   Procedure: EXCISION HYDRIADENITIS SUPRATIVA  RIGHT AXILLA;  Surgeon: Scherry Ran, MD;  Location: [facility];  Service: General;  Laterality: Right;   LIVER BIOPSY  01/10/2011   Procedure: LIVER BIOPSY;  Surgeon: Jamesetta So;  Location: [facility];  Service: General;;   MALONEY DILATION N/A 09/09/2018   Procedure: Venia Minks DILATION;  Surgeon: Daneil Dolin, MD;  Location: [facility];  Service: Endoscopy;  Laterality: N/A;   SKIN SPLIT GRAFT Right 06/18/2012   Procedure: SKIN GRAFT SPLIT THICKNESS RIGHT AXILLA(WITH TWO STANDARD SKIN BOARDS 3"x 8" )  DONOR SITE RIGHT AND LEFT THIGHS;  Surgeon: Scherry Ran, MD;  Location: [facility];  Service: General;  Laterality: Right;    OB History     Gravida  0   Para  0   Term  0   Preterm  0   AB  0   Living  0      SAB  0   IAB  0   Ectopic  0   Multiple  0   Live Births  0            Home Medications    Prior to Admission medications   Medication Sig Start Date End Date Taking? Authorizing Provider  acetaminophen (TYLENOL) 500 MG tablet Take 1,000 mg by mouth every 6 (six) hours as needed (for pain/headaches.).    Yes [provider]  albuterol  (VENTOLIN HFA) 108 (90 Base) MCG/ACT inhaler INHALE 2 PUFFS BY MOUTH EVERY 4 TO 6 HOURS AS NEEDED FOR SHORTNESS OF BREATH 11/12/20  Yes   allopurinol (ZYLOPRIM) 300 MG tablet TAKE 1 TABLET BY MOUTH ONCE DAILY 11/12/20  Yes   allopurinol (ZYLOPRIM) 300 MG tablet TAKE 1 TABLET BY MOUTH ONCE DAILY 06/10/21  Yes   butalbital-acetaminophen-caffeine (FIORICET, ESGIC) 50-325-40 MG tablet Take 1 tablet by mouth daily as needed (headaches.). 01/21/18  Yes [provider]  Continuous Blood Gluc Sensor (DEXCOM G6 SENSOR) MISC CHANGE SENSOR EVERY 10 DAYS. 12/25/20  Yes   Continuous Blood Gluc Sensor (DEXCOM G6 SENSOR) MISC Use as directed change every 10 days 06/27/21  Yes   Continuous Blood Gluc Sensor (DEXCOM G7 SENSOR) MISC Use as directed for blood glucose monitoring, replace every 10 days. 12/06/21  Yes   Continuous Blood Gluc Transmit (DEXCOM G6 TRANSMITTER) MISC Use as directed every 90 days 12/19/20  Yes   dapagliflozin propanediol (FARXIGA) 10 MG TABS tablet Take 1 tablet by mouth once daily 06/10/21  Yes   dapagliflozin propanediol (FARXIGA) 10 MG TABS tablet Take 1 tablet by mouth once a day 06/27/21  Yes   fexofenadine (ALLEGRA ALLERGY) 180 MG tablet Take 1 tablet by mouth daily 11/12/20  Yes   fexofenadine (ALLEGRA) 180 MG tablet Take 1 tablet by mouth once every day 06/10/21  Yes   fluticasone (FLONASE) 50 MCG/ACT nasal spray Place 1 spray into both nostrils 2 (two) times daily. 12/22/21  Yes Volney American, PA-C  hydrochlorothiazide (HYDRODIURIL) 12.5 MG tablet TAKE 1 TABLET BY MOUTH DAILY FOR HIGH BLOOD PRESSURE 06/10/21  Yes   metFORMIN (GLUCOPHAGE-XR) 500 MG 24 hr tablet Take 2 tablets by mouth once daily Patient taking differently: Take 1,000 mg by mouth. 06/21/20  Yes   metFORMIN (GLUCOPHAGE-XR) 500 MG 24 hr tablet TAKE 2 TABLETS BY MOUTH ONCE DAILY 06/10/21  Yes   metFORMIN (GLUCOPHAGE-XR) 500 MG 24 hr tablet Take 2 tablets by mouth once a day 06/27/21  Yes   metoCLOPramide (REGLAN) 10 MG  tablet Take 1 tablet by mouth every 6 (six) hours. 08/02/21  Yes   pantoprazole (PROTONIX) 40 MG tablet Take 1 tablet by mouth twice a day 11/12/20  Yes   pantoprazole (PROTONIX) 40 MG tablet Take 1 tablet by mouth twice a day as directed. 06/10/21  Yes   pravastatin (PRAVACHOL) 20 MG tablet TAKE 1 TABLET BY MOUTH NIGHTLY FOR CHOLESTROL 11/12/20  Yes   pravastatin (PRAVACHOL) 20 MG tablet Take 1 tablet by mouth once every night for cholesterol 06/10/21  Yes   Probiotic, Lactobacillus, CAPS  11/28/19  Yes [provider]  promethazine (PHENERGAN) 25 MG tablet Take 1 tablet (25 mg total) by mouth every 6 (six)  hours as needed for nausea or vomiting. 01/31/21  Yes Estill Dooms, NP  promethazine-dextromethorphan (PROMETHAZINE-DM) 6.25-15 MG/5ML syrup Take 5 mLs by mouth 4 (four) times daily as needed. 12/22/21  Yes Volney American, PA-C  azithromycin (ZITHROMAX) 250 MG tablet Take 1 tablet (250 mg total) by mouth daily. Take first 2 tablets together, then 1 every day until finished. 06/29/21   Leath-Warren, Alda Lea, NP  cephALEXin (KEFLEX) 500 MG capsule Take 1 capsule (500 mg total) by mouth 2 (two) times daily. 04/19/21   Jaynee Eagles, PA-C  cetirizine-pseudoephedrine (ZYRTEC-D) 5-120 MG tablet Take 1 tablet by mouth daily. 06/28/20   Wurst, [place], PA-C  Continuous Blood Gluc Transmit (DEXCOM G6 TRANSMITTER) MISC Use as directed with Dexcom sensor, replace every 90 days 02/06/21     Continuous Blood Gluc Transmit (GUARDIAN TRANSMITTER) MISC Use as directed 06/27/21     fluticasone (FLONASE) 50 MCG/ACT nasal spray Place 2 sprays into both nostrils daily.     [provider]  hydrochlorothiazide (HYDRODIURIL) 12.5 MG tablet TAKE 1 TABLET BY MOUTH DAILY FOR HIGH BLOOD PRESSURE 11/12/20     metoCLOPramide (REGLAN) 10 MG tablet Take 1 tablet (10 mg total) by mouth every 6 (six) hours as needed for nausea or vomiting. 01/31/21   Horton, Barbette Hair, MD  oseltamivir (TAMIFLU) 75 MG capsule  Take 1 capsule (75 mg total) by mouth every 12 (twelve) hours. 12/11/21   Volney American, PA-C  potassium chloride SA (KLOR-CON M) 20 MEQ tablet Take 2 tablets (40 mEq total) by mouth daily. 01/31/21   Horton, Barbette Hair, MD  promethazine-dextromethorphan (PROMETHAZINE-DM) 6.25-15 MG/5ML syrup Take 5 mLs by mouth 4 (four) times daily as needed for cough. 06/29/21   Leath-Warren, Alda Lea, NP  promethazine-dextromethorphan (PROMETHAZINE-DM) 6.25-15 MG/5ML syrup Take 5 mLs by mouth 4 (four) times daily as needed. 12/11/21   Volney American, PA-C  Semaglutide, 1 MG/DOSE, (OZEMPIC, 1 MG/DOSE,) 4 MG/3ML SOPN Inject 1 mg into the skin once a week 05/13/21     Semaglutide, 1 MG/DOSE, (OZEMPIC, 1 MG/DOSE,) 4 MG/3ML SOPN Use as directed into the skin Once a week 06/27/21       Family History Family History  Problem Relation Age of Onset   Stroke Other        paternal great grandfather   Diabetes Father    Hypertension Father    Cancer Maternal Grandfather        adenocarcinoma   Emphysema Maternal Grandfather        smoked   Heart attack Paternal Grandfather    Hypertension Paternal Grandmother    Diabetes Paternal Grandmother    Gout Paternal Grandmother    Hypertension Maternal Grandmother    Other Mother        pre-cancerous cells; had hyst   Emphysema Mother        smoker   Gout Brother    Cancer Sister        cervical; had hyst   Asthma Maternal Uncle    Colon cancer Neg Hx    Liver disease Neg Hx    Liver cancer Neg Hx     Social History Social History   Tobacco Use   Smoking status: Former    Packs/day: 1.00    Years: 10.00    Total pack years: 10.00    Types: Cigarettes    Quit date: 08/23/2017    Years since quitting: 4.3   Smokeless tobacco: Never  Vaping Use   Vaping Use:  Never used  Substance Use Topics   Alcohol use: Not Currently    Comment: occas   Drug use: No     Allergies   Ace inhibitors, Beta adrenergic blockers, Augmentin  [amoxicillin-pot clavulanate], Benicar [olmesartan], Doxycycline, Eggs or egg-derived products, Influenza vaccine live, Influenza virus vaccine, Invokana [canagliflozin], and Liraglutide   Review of Systems Review of Systems Per HPI  Physical Exam Triage Vital Signs ED Triage Vitals  Enc Vitals Group     BP 12/22/21 0823 92/66     Pulse Rate 12/22/21 0820 65     Resp 12/22/21 0820 18     Temp 12/22/21 0820 97.7 F (36.5 C)     Temp Source 12/22/21 0820 Oral     SpO2 12/22/21 0820 97 %     Weight --      Height --      Head Circumference --      Peak Flow --      Pain Score 12/22/21 0824 3     Pain Loc --      Pain Edu? --      Excl. in [place]? --    No data found.  Updated Vital Signs BP 92/66   Pulse 65   Temp 97.7 F (36.5 C) (Oral)   Resp 18   LMP 12/12/2021 (Exact Date)   SpO2 97%   Visual Acuity Right Eye Distance:   Left Eye Distance:   Bilateral Distance:    Right Eye Near:   Left Eye Near:    Bilateral Near:     Physical Exam Vitals and nursing note reviewed.  Constitutional:      Appearance: Normal appearance.  HENT:     Head: Atraumatic.     Right Ear: Tympanic membrane and external ear normal.     Left Ear: Tympanic membrane and external ear normal.     Nose: Rhinorrhea present.     Mouth/Throat:     Mouth: Mucous membranes are moist.     Pharynx: Posterior oropharyngeal erythema present.  Eyes:     Extraocular Movements: Extraocular movements intact.     Conjunctiva/sclera: Conjunctivae normal.  Cardiovascular:     Rate and Rhythm: Normal rate and regular rhythm.     Heart sounds: Normal heart sounds.  Pulmonary:     Effort: Pulmonary effort is normal.     Breath sounds: Normal breath sounds. No wheezing.  Musculoskeletal:        General: Normal range of motion.     Cervical back: Normal range of motion and neck supple.  Skin:    General: Skin is warm and dry.  Neurological:     Mental Status: She is alert and oriented to person,  place, and time.  Psychiatric:        Mood and Affect: Mood normal.        Thought Content: Thought content normal.      UC Treatments / Results  Labs (all labs ordered are listed, but only abnormal results are displayed) Labs Reviewed  RESP PANEL BY RT-PCR (FLU A&B, COVID) [id number]    EKG   Radiology No results found.  Procedures Procedures (including critical care time)  Medications Ordered in UC Medications - No data to display  Initial Impression / Assessment and Plan / UC Course  I have reviewed the triage vital signs and the nursing notes.  Pertinent labs & imaging results that were available during my care of the patient were reviewed by me and considered in my  medical decision making (see chart for details).     Vital signs and exam reassuring and suggestive of a viral upper respiratory infection.  Respiratory panel pending, treat with Phenergan DM, Flonase and good candidate for antiviral therapy if positive for either COVID or flu.  Return for worsening symptoms.  Final Clinical Impressions(s) / UC Diagnoses   Final diagnoses:  Viral URI with cough  Exposure to COVID-19 virus   Discharge Instructions   None    ED Prescriptions     Medication Sig Dispense Auth. Provider   promethazine-dextromethorphan (PROMETHAZINE-DM) 6.25-15 MG/5ML syrup Take 5 mLs by mouth 4 (four) times daily as needed. 100 mL Volney American, PA-C   fluticasone [facility]) 50 MCG/ACT nasal spray Place 1 spray into both nostrils 2 (two) times daily. 16 g Volney American, [place]      PDMP not reviewed this encounter.   Merrie Roof [place], [place] 12/22/21 [phone]

## 2021-12-22 NOTE — ED Triage Notes (Signed)
Cough, sneezing, nasal congestion, headache that started last night recently around family member who now is positive for covid.

## 2022-01-10 ENCOUNTER — Other Ambulatory Visit (HOSPITAL_COMMUNITY): Payer: Self-pay

## 2022-01-10 ENCOUNTER — Other Ambulatory Visit: Payer: Self-pay

## 2022-01-10 DIAGNOSIS — E1165 Type 2 diabetes mellitus with hyperglycemia: Secondary | ICD-10-CM | POA: Diagnosis not present

## 2022-01-10 DIAGNOSIS — I10 Essential (primary) hypertension: Secondary | ICD-10-CM | POA: Diagnosis not present

## 2022-01-10 DIAGNOSIS — E785 Hyperlipidemia, unspecified: Secondary | ICD-10-CM | POA: Diagnosis not present

## 2022-01-10 MED ORDER — NOVOLOG FLEXPEN 100 UNIT/ML ~~LOC~~ SOPN
2.0000 [IU] | PEN_INJECTOR | Freq: Three times a day (TID) | SUBCUTANEOUS | 1 refills | Status: DC
  Filled 2022-01-10: qty 9, 33d supply, fill #0

## 2022-01-10 MED ORDER — FREESTYLE LITE W/DEVICE KIT
PACK | 0 refills | Status: AC
  Filled 2022-01-10 – 2022-01-14 (×2): qty 1, 30d supply, fill #0

## 2022-01-10 MED ORDER — INSULIN DEGLUDEC 100 UNIT/ML ~~LOC~~ SOLN
6.0000 [IU] | Freq: Every evening | SUBCUTANEOUS | 1 refills | Status: DC
  Filled 2022-01-10: qty 10, 28d supply, fill #0

## 2022-01-10 MED ORDER — INSULIN PEN NEEDLE 32G X 4 MM MISC
3 refills | Status: AC
  Filled 2022-01-10 (×2): qty 100, 25d supply, fill #0
  Filled 2022-03-03: qty 100, 25d supply, fill #1

## 2022-01-10 MED ORDER — ONETOUCH DELICA PLUS LANCET33G MISC
3 refills | Status: AC
  Filled 2022-01-10 – 2022-01-14 (×2): qty 100, 33d supply, fill #0

## 2022-01-10 MED ORDER — INSULIN LISPRO (1 UNIT DIAL) 100 UNIT/ML (KWIKPEN)
2.0000 [IU] | PEN_INJECTOR | Freq: Three times a day (TID) | SUBCUTANEOUS | 3 refills | Status: DC
  Filled 2022-01-10: qty 9, 90d supply, fill #0

## 2022-01-10 MED ORDER — GLUCOSE BLOOD VI STRP
ORAL_STRIP | 1 refills | Status: AC
  Filled 2022-01-10 – 2022-01-14 (×2): qty 100, 33d supply, fill #0

## 2022-01-10 MED ORDER — ALBUTEROL SULFATE HFA 108 (90 BASE) MCG/ACT IN AERS
2.0000 | INHALATION_SPRAY | RESPIRATORY_TRACT | 2 refills | Status: DC | PRN
  Filled 2022-01-10: qty 18, 17d supply, fill #0
  Filled 2022-04-05: qty 6.7, 17d supply, fill #1
  Filled 2022-06-01: qty 6.7, 17d supply, fill #2

## 2022-01-10 MED ORDER — TRESIBA FLEXTOUCH 200 UNIT/ML ~~LOC~~ SOPN
6.0000 [IU] | PEN_INJECTOR | Freq: Every evening | SUBCUTANEOUS | 3 refills | Status: DC
  Filled 2022-01-10: qty 3, 56d supply, fill #0

## 2022-01-11 ENCOUNTER — Other Ambulatory Visit (HOSPITAL_COMMUNITY): Payer: Self-pay

## 2022-01-14 ENCOUNTER — Other Ambulatory Visit: Payer: Self-pay

## 2022-01-14 ENCOUNTER — Other Ambulatory Visit (HOSPITAL_COMMUNITY): Payer: Self-pay

## 2022-01-31 ENCOUNTER — Other Ambulatory Visit (HOSPITAL_COMMUNITY): Payer: Self-pay

## 2022-01-31 ENCOUNTER — Encounter: Payer: Self-pay | Admitting: Pharmacist

## 2022-01-31 ENCOUNTER — Other Ambulatory Visit: Payer: Self-pay

## 2022-01-31 DIAGNOSIS — E1165 Type 2 diabetes mellitus with hyperglycemia: Secondary | ICD-10-CM | POA: Diagnosis not present

## 2022-01-31 DIAGNOSIS — I10 Essential (primary) hypertension: Secondary | ICD-10-CM | POA: Diagnosis not present

## 2022-01-31 MED ORDER — GVOKE HYPOPEN 2-PACK 1 MG/0.2ML ~~LOC~~ SOAJ
SUBCUTANEOUS | 1 refills | Status: AC
  Filled 2022-01-31: qty 0.4, 7d supply, fill #0

## 2022-01-31 MED ORDER — TRESIBA FLEXTOUCH 200 UNIT/ML ~~LOC~~ SOPN
14.0000 [IU] | PEN_INJECTOR | Freq: Every evening | SUBCUTANEOUS | 3 refills | Status: DC
  Filled 2022-01-31: qty 6, 80d supply, fill #0
  Filled 2022-02-07: qty 6, 84d supply, fill #0
  Filled 2022-03-03: qty 3, 42d supply, fill #0

## 2022-01-31 MED ORDER — INSULIN LISPRO (1 UNIT DIAL) 100 UNIT/ML (KWIKPEN)
14.0000 [IU] | PEN_INJECTOR | Freq: Three times a day (TID) | SUBCUTANEOUS | 3 refills | Status: DC | PRN
  Filled 2022-01-31: qty 15, 25d supply, fill #0
  Filled 2022-02-07: qty 15, 15d supply, fill #0

## 2022-01-31 MED ORDER — MOUNJARO 2.5 MG/0.5ML ~~LOC~~ SOAJ
2.5000 mg | SUBCUTANEOUS | 0 refills | Status: DC
  Filled 2022-01-31: qty 2, 28d supply, fill #0

## 2022-02-04 ENCOUNTER — Other Ambulatory Visit: Payer: Self-pay

## 2022-02-07 ENCOUNTER — Other Ambulatory Visit (HOSPITAL_COMMUNITY): Payer: Self-pay

## 2022-02-28 ENCOUNTER — Other Ambulatory Visit (HOSPITAL_COMMUNITY): Payer: Self-pay

## 2022-02-28 DIAGNOSIS — E1165 Type 2 diabetes mellitus with hyperglycemia: Secondary | ICD-10-CM | POA: Diagnosis not present

## 2022-02-28 MED ORDER — MOUNJARO 5 MG/0.5ML ~~LOC~~ SOAJ
SUBCUTANEOUS | 3 refills | Status: DC
  Filled 2022-02-28: qty 2, 28d supply, fill #0
  Filled 2022-03-27: qty 2, 28d supply, fill #1
  Filled 2022-04-23: qty 2, 28d supply, fill #2
  Filled 2022-06-01: qty 2, 28d supply, fill #3

## 2022-03-02 DIAGNOSIS — G4733 Obstructive sleep apnea (adult) (pediatric): Secondary | ICD-10-CM | POA: Diagnosis not present

## 2022-03-03 ENCOUNTER — Other Ambulatory Visit: Payer: Self-pay

## 2022-03-03 ENCOUNTER — Encounter (HOSPITAL_COMMUNITY): Payer: Self-pay

## 2022-03-03 ENCOUNTER — Other Ambulatory Visit (HOSPITAL_COMMUNITY): Payer: Self-pay

## 2022-03-04 ENCOUNTER — Other Ambulatory Visit: Payer: Self-pay

## 2022-03-06 ENCOUNTER — Other Ambulatory Visit: Payer: Self-pay

## 2022-03-25 ENCOUNTER — Inpatient Hospital Stay (HOSPITAL_COMMUNITY)
Admission: EM | Admit: 2022-03-25 | Discharge: 2022-03-27 | DRG: 202 | Disposition: A | Discharge status: Home / Self Care | Payer: Commercial Managed Care - PPO | Attending: Internal Medicine | Admitting: Internal Medicine

## 2022-03-25 DIAGNOSIS — E782 Mixed hyperlipidemia: Secondary | ICD-10-CM | POA: Insufficient documentation

## 2022-03-25 DIAGNOSIS — Z7985 Long-term (current) use of injectable non-insulin antidiabetic drugs: Secondary | ICD-10-CM

## 2022-03-25 DIAGNOSIS — Z794 Long term (current) use of insulin: Secondary | ICD-10-CM

## 2022-03-25 DIAGNOSIS — K7581 Nonalcoholic steatohepatitis (NASH): Secondary | ICD-10-CM | POA: Diagnosis present

## 2022-03-25 DIAGNOSIS — Z833 Family history of diabetes mellitus: Secondary | ICD-10-CM

## 2022-03-25 DIAGNOSIS — Z91012 Allergy to eggs: Secondary | ICD-10-CM

## 2022-03-25 DIAGNOSIS — Z881 Allergy status to other antibiotic agents status: Secondary | ICD-10-CM

## 2022-03-25 DIAGNOSIS — J45901 Unspecified asthma with (acute) exacerbation: Secondary | ICD-10-CM | POA: Diagnosis not present

## 2022-03-25 DIAGNOSIS — E119 Type 2 diabetes mellitus without complications: Secondary | ICD-10-CM | POA: Diagnosis present

## 2022-03-25 DIAGNOSIS — Z823 Family history of stroke: Secondary | ICD-10-CM

## 2022-03-25 DIAGNOSIS — Z880 Allergy status to penicillin: Secondary | ICD-10-CM

## 2022-03-25 DIAGNOSIS — R000 Tachycardia, unspecified: Secondary | ICD-10-CM | POA: Diagnosis not present

## 2022-03-25 DIAGNOSIS — J4541 Moderate persistent asthma with (acute) exacerbation: Secondary | ICD-10-CM | POA: Diagnosis not present

## 2022-03-25 DIAGNOSIS — E785 Hyperlipidemia, unspecified: Secondary | ICD-10-CM | POA: Insufficient documentation

## 2022-03-25 DIAGNOSIS — E1169 Type 2 diabetes mellitus with other specified complication: Secondary | ICD-10-CM | POA: Diagnosis present

## 2022-03-25 DIAGNOSIS — Z8049 Family history of malignant neoplasm of other genital organs: Secondary | ICD-10-CM

## 2022-03-25 DIAGNOSIS — Z825 Family history of asthma and other chronic lower respiratory diseases: Secondary | ICD-10-CM

## 2022-03-25 DIAGNOSIS — Z6841 Body Mass Index (BMI) 40.0 and over, adult: Secondary | ICD-10-CM

## 2022-03-25 DIAGNOSIS — Z79899 Other long term (current) drug therapy: Secondary | ICD-10-CM

## 2022-03-25 DIAGNOSIS — E282 Polycystic ovarian syndrome: Secondary | ICD-10-CM | POA: Diagnosis present

## 2022-03-25 DIAGNOSIS — E876 Hypokalemia: Secondary | ICD-10-CM | POA: Diagnosis present

## 2022-03-25 DIAGNOSIS — Z7984 Long term (current) use of oral hypoglycemic drugs: Secondary | ICD-10-CM

## 2022-03-25 DIAGNOSIS — Z8249 Family history of ischemic heart disease and other diseases of the circulatory system: Secondary | ICD-10-CM

## 2022-03-25 DIAGNOSIS — G4730 Sleep apnea, unspecified: Secondary | ICD-10-CM | POA: Diagnosis present

## 2022-03-25 DIAGNOSIS — K219 Gastro-esophageal reflux disease without esophagitis: Secondary | ICD-10-CM | POA: Diagnosis present

## 2022-03-25 DIAGNOSIS — Z87891 Personal history of nicotine dependence: Secondary | ICD-10-CM

## 2022-03-25 DIAGNOSIS — E669 Obesity, unspecified: Secondary | ICD-10-CM | POA: Diagnosis present

## 2022-03-25 DIAGNOSIS — E1165 Type 2 diabetes mellitus with hyperglycemia: Secondary | ICD-10-CM

## 2022-03-25 DIAGNOSIS — R9431 Abnormal electrocardiogram [ECG] [EKG]: Secondary | ICD-10-CM | POA: Insufficient documentation

## 2022-03-25 DIAGNOSIS — Z888 Allergy status to other drugs, medicaments and biological substances status: Secondary | ICD-10-CM

## 2022-03-25 DIAGNOSIS — M109 Gout, unspecified: Secondary | ICD-10-CM | POA: Diagnosis present

## 2022-03-25 MED ORDER — LORAZEPAM 2 MG/ML PO CONC: 1.0000 mg | Freq: Once | ORAL | Status: DC

## 2022-03-25 MED ORDER — IPRATROPIUM-ALBUTEROL 0.5-2.5 (3) MG/3ML IN SOLN
RESPIRATORY_TRACT | Status: AC
  Filled 2022-03-25: qty 3

## 2022-03-25 MED ORDER — METHYLPREDNISOLONE SODIUM SUCC 125 MG IJ SOLR
125.0000 mg | Freq: Once | INTRAMUSCULAR | Status: AC
  Administered 2022-03-25: 125 mg via INTRAVENOUS
  Filled 2022-03-25: qty 2

## 2022-03-25 MED ORDER — MAGNESIUM SULFATE 2 GM/50ML IV SOLN
2.0000 g | Freq: Once | INTRAVENOUS | Status: AC
  Administered 2022-03-26: 2 g via INTRAVENOUS
  Filled 2022-03-25: qty 50

## 2022-03-26 ENCOUNTER — Encounter (HOSPITAL_COMMUNITY): Payer: Self-pay | Admitting: Family Medicine

## 2022-03-26 ENCOUNTER — Other Ambulatory Visit (HOSPITAL_COMMUNITY): Payer: Commercial Managed Care - PPO

## 2022-03-26 ENCOUNTER — Other Ambulatory Visit: Payer: Self-pay

## 2022-03-26 ENCOUNTER — Emergency Department (HOSPITAL_COMMUNITY): Payer: Commercial Managed Care - PPO

## 2022-03-26 DIAGNOSIS — Z823 Family history of stroke: Secondary | ICD-10-CM | POA: Diagnosis not present

## 2022-03-26 DIAGNOSIS — Z794 Long term (current) use of insulin: Secondary | ICD-10-CM | POA: Diagnosis not present

## 2022-03-26 DIAGNOSIS — R9431 Abnormal electrocardiogram [ECG] [EKG]: Secondary | ICD-10-CM | POA: Insufficient documentation

## 2022-03-26 DIAGNOSIS — K7581 Nonalcoholic steatohepatitis (NASH): Secondary | ICD-10-CM | POA: Diagnosis not present

## 2022-03-26 DIAGNOSIS — Z880 Allergy status to penicillin: Secondary | ICD-10-CM | POA: Diagnosis not present

## 2022-03-26 DIAGNOSIS — E119 Type 2 diabetes mellitus without complications: Secondary | ICD-10-CM | POA: Diagnosis not present

## 2022-03-26 DIAGNOSIS — Z833 Family history of diabetes mellitus: Secondary | ICD-10-CM | POA: Diagnosis not present

## 2022-03-26 DIAGNOSIS — E876 Hypokalemia: Secondary | ICD-10-CM | POA: Diagnosis not present

## 2022-03-26 DIAGNOSIS — J45901 Unspecified asthma with (acute) exacerbation: Secondary | ICD-10-CM | POA: Diagnosis present

## 2022-03-26 DIAGNOSIS — M109 Gout, unspecified: Secondary | ICD-10-CM | POA: Diagnosis present

## 2022-03-26 DIAGNOSIS — Z6841 Body Mass Index (BMI) 40.0 and over, adult: Secondary | ICD-10-CM | POA: Diagnosis not present

## 2022-03-26 DIAGNOSIS — E1169 Type 2 diabetes mellitus with other specified complication: Secondary | ICD-10-CM | POA: Diagnosis not present

## 2022-03-26 DIAGNOSIS — E785 Hyperlipidemia, unspecified: Secondary | ICD-10-CM | POA: Diagnosis not present

## 2022-03-26 DIAGNOSIS — E282 Polycystic ovarian syndrome: Secondary | ICD-10-CM | POA: Diagnosis present

## 2022-03-26 DIAGNOSIS — E1165 Type 2 diabetes mellitus with hyperglycemia: Secondary | ICD-10-CM | POA: Diagnosis not present

## 2022-03-26 DIAGNOSIS — Z8249 Family history of ischemic heart disease and other diseases of the circulatory system: Secondary | ICD-10-CM | POA: Diagnosis not present

## 2022-03-26 DIAGNOSIS — Z825 Family history of asthma and other chronic lower respiratory diseases: Secondary | ICD-10-CM | POA: Diagnosis not present

## 2022-03-26 DIAGNOSIS — Z91012 Allergy to eggs: Secondary | ICD-10-CM | POA: Diagnosis not present

## 2022-03-26 DIAGNOSIS — E782 Mixed hyperlipidemia: Secondary | ICD-10-CM | POA: Diagnosis not present

## 2022-03-26 DIAGNOSIS — Z8049 Family history of malignant neoplasm of other genital organs: Secondary | ICD-10-CM | POA: Diagnosis not present

## 2022-03-26 DIAGNOSIS — K219 Gastro-esophageal reflux disease without esophagitis: Secondary | ICD-10-CM | POA: Diagnosis not present

## 2022-03-26 DIAGNOSIS — Z79899 Other long term (current) drug therapy: Secondary | ICD-10-CM | POA: Diagnosis not present

## 2022-03-26 DIAGNOSIS — E669 Obesity, unspecified: Secondary | ICD-10-CM | POA: Diagnosis not present

## 2022-03-26 DIAGNOSIS — J4541 Moderate persistent asthma with (acute) exacerbation: Secondary | ICD-10-CM

## 2022-03-26 DIAGNOSIS — G4730 Sleep apnea, unspecified: Secondary | ICD-10-CM | POA: Diagnosis present

## 2022-03-26 DIAGNOSIS — R0600 Dyspnea, unspecified: Secondary | ICD-10-CM | POA: Diagnosis not present

## 2022-03-26 DIAGNOSIS — Z87891 Personal history of nicotine dependence: Secondary | ICD-10-CM | POA: Diagnosis not present

## 2022-03-26 DIAGNOSIS — Z888 Allergy status to other drugs, medicaments and biological substances status: Secondary | ICD-10-CM | POA: Diagnosis not present

## 2022-03-26 DIAGNOSIS — Z881 Allergy status to other antibiotic agents status: Secondary | ICD-10-CM | POA: Diagnosis not present

## 2022-03-26 LAB — CBC WITH DIFFERENTIAL/PLATELET
Abs Immature Granulocytes: 0.02 10*3/uL (ref 0.00–0.07)
Basophils Absolute: 0 10*3/uL (ref 0.0–0.1)
Basophils Relative: 0 %
Eosinophils Absolute: 0 10*3/uL (ref 0.0–0.5)
Eosinophils Relative: 0 %
HCT: 43.2 % (ref 36.0–46.0)
Hemoglobin: 14.9 g/dL (ref 12.0–15.0)
Immature Granulocytes: 0 %
Lymphocytes Relative: 8 %
Lymphs Abs: 0.5 10*3/uL — ABNORMAL LOW (ref 0.7–4.0)
MCH: 30.8 pg (ref 26.0–34.0)
MCHC: 34.5 g/dL (ref 30.0–36.0)
MCV: 89.4 fL (ref 80.0–100.0)
Monocytes Absolute: 0.1 10*3/uL (ref 0.1–1.0)
Monocytes Relative: 1 %
Neutro Abs: 5.6 10*3/uL (ref 1.7–7.7)
Neutrophils Relative %: 91 %
Platelets: 225 10*3/uL (ref 150–400)
RBC: 4.83 MIL/uL (ref 3.87–5.11)
RDW: 13.2 % (ref 11.5–15.5)
WBC: 6.3 10*3/uL (ref 4.0–10.5)
nRBC: 0 % (ref 0.0–0.2)

## 2022-03-26 LAB — CBC
HCT: 47.2 % — ABNORMAL HIGH (ref 36.0–46.0)
Hemoglobin: 16.3 g/dL — ABNORMAL HIGH (ref 12.0–15.0)
MCH: 30.6 pg (ref 26.0–34.0)
MCHC: 34.5 g/dL (ref 30.0–36.0)
MCV: 88.7 fL (ref 80.0–100.0)
Platelets: 223 10*3/uL (ref 150–400)
RBC: 5.32 MIL/uL — ABNORMAL HIGH (ref 3.87–5.11)
RDW: 13.3 % (ref 11.5–15.5)
WBC: 7.1 10*3/uL (ref 4.0–10.5)
nRBC: 0 % (ref 0.0–0.2)

## 2022-03-26 LAB — BASIC METABOLIC PANEL
Anion gap: 10 (ref 5–15)
Anion gap: 13 (ref 5–15)
BUN: 6 mg/dL (ref 6–20)
BUN: 7 mg/dL (ref 6–20)
CO2: 20 mmol/L — ABNORMAL LOW (ref 22–32)
CO2: 21 mmol/L — ABNORMAL LOW (ref 22–32)
Calcium: 8.3 mg/dL — ABNORMAL LOW (ref 8.9–10.3)
Calcium: 8.9 mg/dL (ref 8.9–10.3)
Chloride: 102 mmol/L (ref 98–111)
Chloride: 99 mmol/L (ref 98–111)
Creatinine, Ser: 0.66 mg/dL (ref 0.44–1.00)
Creatinine, Ser: 0.77 mg/dL (ref 0.44–1.00)
GFR, Estimated: >60 mL/min (ref >60)
GFR, Estimated: >60 mL/min (ref >60)
Glucose, Bld: 201 mg/dL — ABNORMAL HIGH (ref 70–99)
Glucose, Bld: 247 mg/dL — ABNORMAL HIGH (ref 70–99)
Potassium: 2.6 mmol/L — CL (ref 3.5–5.1)
Potassium: 3.8 mmol/L (ref 3.5–5.1)
Sodium: 132 mmol/L — ABNORMAL LOW (ref 135–145)
Sodium: 133 mmol/L — ABNORMAL LOW (ref 135–145)

## 2022-03-26 LAB — COMPREHENSIVE METABOLIC PANEL
ALT: 110 U/L — ABNORMAL HIGH (ref 0–44)
AST: 87 U/L — ABNORMAL HIGH (ref 15–41)
Albumin: 3.5 g/dL (ref 3.5–5.0)
Alkaline Phosphatase: 70 U/L (ref 38–126)
Anion gap: 15 (ref 5–15)
BUN: 7 mg/dL (ref 6–20)
CO2: 18 mmol/L — ABNORMAL LOW (ref 22–32)
Calcium: 8.4 mg/dL — ABNORMAL LOW (ref 8.9–10.3)
Chloride: 100 mmol/L (ref 98–111)
Creatinine, Ser: 0.74 mg/dL (ref 0.44–1.00)
GFR, Estimated: >60 mL/min (ref >60)
Glucose, Bld: 308 mg/dL — ABNORMAL HIGH (ref 70–99)
Potassium: 2.9 mmol/L — ABNORMAL LOW (ref 3.5–5.1)
Sodium: 133 mmol/L — ABNORMAL LOW (ref 135–145)
Total Bilirubin: 0.5 mg/dL (ref 0.3–1.2)
Total Protein: 7.1 g/dL (ref 6.5–8.1)

## 2022-03-26 LAB — GLUCOSE, CAPILLARY
Glucose-Capillary: 311 mg/dL — ABNORMAL HIGH (ref 70–99)
Glucose-Capillary: 344 mg/dL — ABNORMAL HIGH (ref 70–99)
Glucose-Capillary: 258 mg/dL — ABNORMAL HIGH (ref 70–99)
Glucose-Capillary: 277 mg/dL — ABNORMAL HIGH (ref 70–99)
Glucose-Capillary: 357 mg/dL — ABNORMAL HIGH (ref 70–99)

## 2022-03-26 LAB — HEMOGLOBIN A1C
Hgb A1c MFr Bld: 7.2 % — ABNORMAL HIGH (ref 4.8–5.6)
Mean Plasma Glucose: 160 mg/dL

## 2022-03-26 LAB — TROPONIN I (HIGH SENSITIVITY)
Troponin I (High Sensitivity): <2 ng/L (ref <18)
Troponin I (High Sensitivity): 7 ng/L (ref <18)

## 2022-03-26 LAB — BRAIN NATRIURETIC PEPTIDE: B Natriuretic Peptide: 10 pg/mL (ref 0.0–100.0)

## 2022-03-26 LAB — MAGNESIUM: Magnesium: 1.8 mg/dL (ref 1.7–2.4)

## 2022-03-26 LAB — HIV ANTIBODY (ROUTINE TESTING W REFLEX): HIV Screen 4th Generation wRfx: NONREACTIVE

## 2022-03-26 MED ORDER — ENOXAPARIN SODIUM 40 MG/0.4ML IJ SOSY
40.0000 mg | PREFILLED_SYRINGE | INTRAMUSCULAR | Status: DC
  Filled 2022-03-26 (×2): qty 0.4

## 2022-03-26 MED ORDER — PROMETHAZINE HCL 12.5 MG PO TABS: 25.0000 mg | ORAL_TABLET | Freq: Four times a day (QID) | ORAL | Status: DC | PRN

## 2022-03-26 MED ORDER — LORAZEPAM 2 MG/ML IJ SOLN
1.0000 mg | Freq: Once | INTRAMUSCULAR | Status: AC
  Administered 2022-03-26: 1 mg via INTRAVENOUS
  Filled 2022-03-26: qty 1

## 2022-03-26 MED ORDER — PRAVASTATIN SODIUM 40 MG PO TABS
20.0000 mg | ORAL_TABLET | Freq: Every day | ORAL | Status: DC
  Administered 2022-03-26: 20 mg via ORAL
  Filled 2022-03-26: qty 1

## 2022-03-26 MED ORDER — POTASSIUM CHLORIDE CRYS ER 20 MEQ PO TBCR
40.0000 meq | EXTENDED_RELEASE_TABLET | Freq: Once | ORAL | Status: AC
  Administered 2022-03-26: 40 meq via ORAL
  Filled 2022-03-26: qty 2

## 2022-03-26 MED ORDER — MAGNESIUM SULFATE 2 GM/50ML IV SOLN
2.0000 g | Freq: Once | INTRAVENOUS | Status: AC
  Administered 2022-03-26: 2 g via INTRAVENOUS
  Filled 2022-03-26: qty 50

## 2022-03-26 MED ORDER — ACETAMINOPHEN 325 MG PO TABS: 650.0000 mg | ORAL_TABLET | Freq: Four times a day (QID) | ORAL | Status: DC | PRN

## 2022-03-26 MED ORDER — POTASSIUM CHLORIDE 20 MEQ PO PACK
40.0000 meq | PACK | Freq: Once | ORAL | Status: AC
  Administered 2022-03-26: 40 meq via ORAL
  Filled 2022-03-26: qty 2

## 2022-03-26 MED ORDER — INSULIN ASPART 100 UNIT/ML IJ SOLN
0.0000 [IU] | INTRAMUSCULAR | Status: DC
  Administered 2022-03-26 (×2): 11 [IU] via SUBCUTANEOUS

## 2022-03-26 MED ORDER — INSULIN ASPART 100 UNIT/ML IJ SOLN
0.0000 [IU] | Freq: Every day | INTRAMUSCULAR | Status: DC
  Administered 2022-03-26: 5 [IU] via SUBCUTANEOUS

## 2022-03-26 MED ORDER — ALBUTEROL SULFATE (2.5 MG/3ML) 0.083% IN NEBU: 2.5000 mg | INHALATION_SOLUTION | RESPIRATORY_TRACT | Status: DC | PRN

## 2022-03-26 MED ORDER — INSULIN GLARGINE-YFGN 100 UNIT/ML ~~LOC~~ SOLN
15.0000 [IU] | Freq: Every day | SUBCUTANEOUS | Status: DC
  Administered 2022-03-26 – 2022-03-27 (×2): 15 [IU] via SUBCUTANEOUS
  Filled 2022-03-26 (×3): qty 0.15

## 2022-03-26 MED ORDER — POTASSIUM CHLORIDE 10 MEQ/100ML IV SOLN
10.0000 meq | Freq: Once | INTRAVENOUS | Status: AC
  Administered 2022-03-26: 10 meq via INTRAVENOUS
  Filled 2022-03-26: qty 100

## 2022-03-26 MED ORDER — BUTALBITAL-APAP-CAFFEINE 50-325-40 MG PO TABS: 1.0000 | ORAL_TABLET | Freq: Every day | ORAL | Status: DC | PRN

## 2022-03-26 MED ORDER — HYDROCHLOROTHIAZIDE 12.5 MG PO TABS
12.5000 mg | ORAL_TABLET | Freq: Every day | ORAL | Status: DC
  Administered 2022-03-26 – 2022-03-27 (×2): 12.5 mg via ORAL
  Filled 2022-03-26 (×2): qty 1

## 2022-03-26 MED ORDER — IPRATROPIUM-ALBUTEROL 0.5-2.5 (3) MG/3ML IN SOLN
3.0000 mL | Freq: Four times a day (QID) | RESPIRATORY_TRACT | Status: DC
  Administered 2022-03-26 – 2022-03-27 (×5): 3 mL via RESPIRATORY_TRACT
  Filled 2022-03-26 (×5): qty 3

## 2022-03-26 MED ORDER — INSULIN ASPART 100 UNIT/ML IJ SOLN
0.0000 [IU] | Freq: Three times a day (TID) | INTRAMUSCULAR | Status: DC
  Administered 2022-03-26 (×2): 8 [IU] via SUBCUTANEOUS
  Administered 2022-03-27: 3 [IU] via SUBCUTANEOUS

## 2022-03-26 MED ORDER — METHYLPREDNISOLONE SODIUM SUCC 125 MG IJ SOLR
125.0000 mg | Freq: Every day | INTRAMUSCULAR | Status: DC
  Administered 2022-03-26 – 2022-03-27 (×2): 125 mg via INTRAVENOUS
  Filled 2022-03-26 (×2): qty 2

## 2022-03-26 MED ORDER — ORAL CARE MOUTH RINSE: 15.0000 mL | OROMUCOSAL | Status: DC | PRN

## 2022-03-26 MED ORDER — OXYCODONE HCL 5 MG PO TABS: 5.0000 mg | ORAL_TABLET | ORAL | Status: DC | PRN

## 2022-03-26 MED ORDER — PANTOPRAZOLE SODIUM 40 MG PO TBEC
40.0000 mg | DELAYED_RELEASE_TABLET | Freq: Two times a day (BID) | ORAL | Status: DC
  Administered 2022-03-26 – 2022-03-27 (×3): 40 mg via ORAL
  Filled 2022-03-26 (×3): qty 1

## 2022-03-26 MED ORDER — ACETAMINOPHEN 650 MG RE SUPP: 650.0000 mg | Freq: Four times a day (QID) | RECTAL | Status: DC | PRN

## 2022-03-26 NOTE — Progress Notes (Signed)
Patient admitted after midnight, please see H&P.  Here with asthma exacerbation.  Overall improved since IV steroids-- continue management.  Home in 24 hours? Eulogio Bear DO

## 2022-03-26 NOTE — ED Notes (Signed)
Hospitalist at bedside 

## 2022-03-26 NOTE — Inpatient Diabetes Management (Signed)
Inpatient Diabetes Program Recommendations  AACE/ADA: New Consensus Statement on Inpatient Glycemic Control (2015)  Target Ranges:  Prepandial:   less than 140 mg/dL      Peak postprandial:   less than 180 mg/dL (1-2 hours)      Critically ill patients:  140 - 180 mg/dL   Lab Results  Component Value Date   GLUCAP 344 (H) 03/26/2022   HGBA1C 8.9 (H) 03/18/2018    Review of Glycemic Control  Diabetes history: DM2 Outpatient Diabetes medications:  Tresiba 14 units am, 6 units pm Humalog 14-20 units tid meal coverage Farxiga 10 mg qd Mounjaro 5 mg q week Current orders for Inpatient glycemic control: Novolog 0-15 units q 4 hrs., Solumedrol 125 mg qd  Inpatient Diabetes Program Recommendations:   Please consider: -Add Semglee 20 units now and qd (0.15 units/kg x 129.2 kg = 19.2 units)  Thank you, Bethena Roys E. Anesha Hackert, RN, MSN, CDE  Diabetes Coordinator Inpatient Glycemic Control Team Team Pager [phone] (8am-5pm) 03/26/2022 10:39 AM

## 2022-03-26 NOTE — Assessment & Plan Note (Signed)
-   Currently only on Mounjaro at home - Given steroids for asthma exacerbation, anticipating elevated glucose - Sliding scale coverage - Every 4 hours CBG as patient reports she has had labile glucose with hypoglycemic episodes - Continue to monitor

## 2022-03-26 NOTE — Assessment & Plan Note (Signed)
-   Likely related to hypokalemia - Monitor on telemetry - Avoid QT prolonging agents when possible - Continue to monitor

## 2022-03-26 NOTE — ED Provider Notes (Signed)
[place] EMERGENCY DEPARTMENT AT [facility] Provider Note   CSN: [id number] Arrival date & time: 03/25/22  2352     History  No chief complaint on file.   Cheryl Black is a 37 y.o. female.  Patient is a 37 year old female with past medical history of polycystic ovaries, type 2 diabetes, acute bronchospasm with respiratory failure requiring intubation.  Patient presenting with shortness of breath/respiratory distress.  She started having cough several days ago, then worsened acutely this evening.  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  She was given a breathing treatment with little relief.  She then became much more short of breath and developed respiratory distress.  She denies to me she is having any fevers or chills.  She denies chest pain or leg swelling.  The history is provided by the patient.       Home Medications Prior to Admission medications   Medication Sig Start Date End Date Taking? Authorizing Provider  acetaminophen (TYLENOL) 500 MG tablet Take 1,000 mg by mouth every 6 (six) hours as needed (for pain/headaches.).     [provider]  albuterol (VENTOLIN HFA) 108 (90 Base) MCG/ACT inhaler Inhale 2 puffs into the lungs every 4 (four) - 6 (six)  hours as needed for shortness of breath. 01/10/22     allopurinol (ZYLOPRIM) 300 MG tablet TAKE 1 TABLET BY MOUTH ONCE DAILY 11/12/20     allopurinol (ZYLOPRIM) 300 MG tablet TAKE 1 TABLET BY MOUTH ONCE DAILY 06/10/21     azithromycin (ZITHROMAX) 250 MG tablet Take 1 tablet (250 mg total) by mouth daily. Take first 2 tablets together, then 1 every day until finished. 06/29/21   Leath-Warren, Sadie Haber, NP  Blood Glucose Monitoring Suppl (FREESTYLE LITE) w/Device KIT Use to test blood sugar 01/10/22     butalbital-acetaminophen-caffeine (FIORICET, ESGIC) 50-325-40 MG tablet Take 1 tablet by mouth daily as needed (headaches.). 01/21/18   [provider]  cephALEXin (KEFLEX)  500 MG capsule Take 1 capsule (500 mg total) by mouth 2 (two) times daily. 04/19/21   Wallis Bamberg, PA-C  cetirizine-pseudoephedrine (ZYRTEC-D) 5-120 MG tablet Take 1 tablet by mouth daily. 06/28/20   Wurst, [place], PA-C  Continuous Blood Gluc Sensor (DEXCOM G6 SENSOR) MISC CHANGE SENSOR EVERY 10 DAYS. 12/25/20     Continuous Blood Gluc Sensor (DEXCOM G6 SENSOR) MISC Use as directed change every 10 days 06/27/21     Continuous Blood Gluc Sensor (DEXCOM G7 SENSOR) MISC Use as directed for blood glucose monitoring, replace every 10 days. 12/06/21     Continuous Blood Gluc Transmit (DEXCOM G6 TRANSMITTER) MISC Use as directed every 90 days 12/19/20     Continuous Blood Gluc Transmit (DEXCOM G6 TRANSMITTER) MISC Use as directed with Dexcom sensor, replace every 90 days 02/06/21     Continuous Blood Gluc Transmit (GUARDIAN TRANSMITTER) MISC Use as directed 06/27/21     dapagliflozin propanediol (FARXIGA) 10 MG TABS tablet Take 1 tablet by mouth once daily 06/10/21     dapagliflozin propanediol (FARXIGA) 10 MG TABS tablet Take 1 tablet by mouth once a day 06/27/21     fexofenadine (ALLEGRA ALLERGY) 180 MG tablet Take 1 tablet by mouth daily 11/12/20     fexofenadine (ALLEGRA) 180 MG tablet Take 1 tablet by mouth once every day 06/10/21     fluticasone (FLONASE) 50 MCG/ACT nasal spray Place 2 sprays into both nostrils daily.     [provider]  fluticasone (  FLONASE) 50 MCG/ACT nasal spray Place 1 spray into both nostrils 2 (two) times daily. 12/22/21   Particia Nearing, PA-C  Glucagon (GVOKE HYPOPEN 2-PACK) 1 MG/0.2ML SOAJ use under the skin for hypoglycemia as needed 01/31/22     glucose blood test strip Use 2-3 times daily to test blood sugar 01/10/22     hydrochlorothiazide (HYDRODIURIL) 12.5 MG tablet TAKE 1 TABLET BY MOUTH DAILY FOR HIGH BLOOD PRESSURE 11/12/20     hydrochlorothiazide (HYDRODIURIL) 12.5 MG tablet TAKE 1 TABLET BY MOUTH DAILY FOR HIGH BLOOD PRESSURE 06/10/21     insulin aspart (NOVOLOG  FLEXPEN) 100 UNIT/ML FlexPen Inject 2-3 Units into the skin 3 (three) times daily with meals. 01/10/22     insulin degludec (TRESIBA FLEXTOUCH) 200 UNIT/ML FlexTouch Pen Inject 14 Units into the skin every evening. 01/31/22     Insulin Degludec (TRESIBA) 100 UNIT/ML SOLN Inject 6 Units into the skin every evening. 01/10/22     insulin lispro (HUMALOG KWIKPEN) 100 UNIT/ML KwikPen Inject 14-20 Units + correctional insulin into the skin 3 (three) times daily with meals. Max daily dose of 100 units. 01/31/22     Insulin Pen Needle 32G X 4 MM MISC Use as directed to inject insulin 4 times a day 01/10/22     Lancets (ONETOUCH DELICA PLUS LANCET33G) MISC Use to test blood sugar 2-3 times daily 01/10/22     metFORMIN (GLUCOPHAGE-XR) 500 MG 24 hr tablet Take 2 tablets by mouth once daily Patient taking differently: Take 1,000 mg by mouth. 06/21/20     metFORMIN (GLUCOPHAGE-XR) 500 MG 24 hr tablet TAKE 2 TABLETS BY MOUTH ONCE DAILY 06/10/21     metFORMIN (GLUCOPHAGE-XR) 500 MG 24 hr tablet Take 2 tablets by mouth once a day 06/27/21     metoCLOPramide (REGLAN) 10 MG tablet Take 1 tablet (10 mg total) by mouth every 6 (six) hours as needed for nausea or vomiting. 01/31/21   Horton, Mayer Masker, MD  metoCLOPramide (REGLAN) 10 MG tablet Take 1 tablet by mouth every 6 (six) hours. 08/02/21     oseltamivir (TAMIFLU) 75 MG capsule Take 1 capsule (75 mg total) by mouth every 12 (twelve) hours. 12/11/21   Particia Nearing, PA-C  pantoprazole (PROTONIX) 40 MG tablet Take 1 tablet by mouth twice a day 11/12/20     pantoprazole (PROTONIX) 40 MG tablet Take 1 tablet by mouth twice a day as directed. 06/10/21     potassium chloride SA (KLOR-CON M) 20 MEQ tablet Take 2 tablets (40 mEq total) by mouth daily. 01/31/21   Horton, Mayer Masker, MD  pravastatin (PRAVACHOL) 20 MG tablet TAKE 1 TABLET BY MOUTH NIGHTLY FOR CHOLESTROL 11/12/20     pravastatin (PRAVACHOL) 20 MG tablet Take 1 tablet by mouth once every night for cholesterol 06/10/21      Probiotic, Lactobacillus, CAPS  11/28/19   [provider]  promethazine (PHENERGAN) 25 MG tablet Take 1 tablet (25 mg total) by mouth every 6 (six) hours as needed for nausea or vomiting. 01/31/21   Adline Potter, NP  promethazine-dextromethorphan (PROMETHAZINE-DM) 6.25-15 MG/5ML syrup Take 5 mLs by mouth 4 (four) times daily as needed for cough. 06/29/21   Leath-Warren, Sadie Haber, NP  promethazine-dextromethorphan (PROMETHAZINE-DM) 6.25-15 MG/5ML syrup Take 5 mLs by mouth 4 (four) times daily as needed. 12/11/21   Particia Nearing, PA-C  promethazine-dextromethorphan (PROMETHAZINE-DM) 6.25-15 MG/5ML syrup Take 5 mLs by mouth 4 (four) times daily as needed. 12/22/21   Particia Nearing, PA-C  Semaglutide,  1 MG/DOSE, (OZEMPIC, 1 MG/DOSE,) 4 MG/3ML SOPN Inject 1 mg into the skin once a week 05/13/21     Semaglutide, 1 MG/DOSE, (OZEMPIC, 1 MG/DOSE,) 4 MG/3ML SOPN Use as directed into the skin Once a week 06/27/21     tirzepatide (MOUNJARO) 2.5 MG/0.5ML Pen Inject 2.5 mg into the skin every 7 (seven) days. 01/31/22     tirzepatide (MOUNJARO) 5 MG/0.5ML Pen Inject 5 mg subcutaneous once weekly for 30 days. 02/28/22         Allergies    Ace inhibitors, Beta adrenergic blockers, Augmentin [amoxicillin-pot clavulanate], Benicar [olmesartan], Doxycycline, Egg-derived products, Influenza vaccine live, Influenza virus vaccine, Invokana [canagliflozin], and Liraglutide    Review of Systems   Review of Systems  All other systems reviewed and are negative.   Physical Exam Updated Vital Signs BP (!) 147/82   Pulse (!) 137   Resp (!) 22   SpO2 100%  Physical Exam Vitals and nursing note reviewed.  Constitutional:      General: She is not in acute distress.    Appearance: She is well-developed. She is not diaphoretic.  HENT:     Head: Normocephalic and atraumatic.  Cardiovascular:     Rate and Rhythm: Regular rhythm. Tachycardia present.     Heart sounds: No murmur heard.     No friction rub. No gallop.  Pulmonary:     Effort: No respiratory distress.     Breath sounds: Wheezing present.     Comments: Patient arrives here in moderate respiratory distress with tachypnea, audible wheezing, and accessory muscle use. Abdominal:     General: Bowel sounds are normal. There is no distension.     Palpations: Abdomen is soft.     Tenderness: There is no abdominal tenderness.  Musculoskeletal:        General: Normal range of motion.     Cervical back: Normal range of motion and neck supple.     Right lower leg: No edema.     Left lower leg: No edema.  Skin:    General: Skin is warm and dry.  Neurological:     General: No focal deficit present.     Mental Status: She is alert and oriented to person, place, and time.     ED Results / Procedures / Treatments   Labs (all labs ordered are listed, but only abnormal results are displayed) Labs Reviewed  BASIC METABOLIC PANEL  CBC    EKG EKG Interpretation  Date/Time:  Tuesday March 25 2022 23:53:34 EDT Ventricular Rate:  138 PR Interval:  165 QRS Duration: 163 QT Interval:  423 QTC Calculation: 637 R Axis:   -48 Text Interpretation: Sinus tachycardia Multiple premature complexes, vent & supraven Nonspecific IVCD with LAD Baseline wander in lead(s) V1 V2 V3 V4 V5 V6 Confirmed by Geoffery Lyons ([id number]) on 03/26/2022 12:03:49 AM  Radiology No results found.  Procedures Procedures    Medications Ordered in ED Medications  LORazepam (ATIVAN) 2 MG/ML concentrated solution 1 mg (has no administration in time range)  magnesium sulfate IVPB 2 g 50 mL (2 g Intravenous New Bag/Given 03/26/22 0001)  ipratropium-albuterol (DUONEB) 0.5-2.5 (3) MG/3ML nebulizer solution (has no administration in time range)  methylPREDNISolone sodium succinate (SOLU-MEDROL) 125 mg/2 mL injection 125 mg (125 mg Intravenous Given 03/25/22 2359)    ED Course/ Medical Decision Making/ A&P  Patient is a 37 year old female with history  of PCOS, type 2 diabetes, and presumed asthma.  She has experienced prior bronchospasm and is actually  required intubation in the past.  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  I was asked to see the patient on an emergent basis.  When I arrived to the room, she was then significant respiratory distress with audible wheezing, tachypnea, and accessory muscle use.   Workup initiated including CBC, BMP, troponin, and BNP.  All studies essentially unremarkable.  Chest x-ray shows no acute process.  Patient has been given albuterol followed by a DuoNeb followed by racemic epinephrine.  She was also given Solu-Medrol and magnesium.  Her status has improved somewhat, but feel as though she will require admission.  She remains tachypneic, but not hypoxic.  I have spoken with the hospitalist who agrees to admit.  Final Clinical Impression(s) / ED Diagnoses Final diagnoses:  None    Rx / DC Orders ED Discharge Orders     None         Geoffery Lyons, MD 03/26/22 [phone]

## 2022-03-26 NOTE — Assessment & Plan Note (Signed)
Continue statin. 

## 2022-03-26 NOTE — Assessment & Plan Note (Signed)
-   With acute onset of wheezing, dyspnea - In the setting of recent viral URI symptoms - Received 3 breathing treatments in the ED - Continue as needed albuterol, scheduled DuoNeb, steroids - Currently maintaining oxygen sats on room air as long as she does not exert herself - No asymmetrical peripheral edema, no history of blood clot, no pleuritic pain - Continue to monitor

## 2022-03-26 NOTE — Assessment & Plan Note (Signed)
-   Likely related to HCTZ - Potassium 2.6 - Replace with 50 mEq - Recheck in the a.m., will likely need further replacement - Check magnesium in the a.m. as well - Continue to monitor

## 2022-03-26 NOTE — Progress Notes (Signed)
  Transition of Care [facility]) Screening Note   Patient Details  Name: Cheryl Black Date of Birth: 1985/01/14   Transition of Care [facility]) CM/SW Contact:    Iona Beard, [place] Phone Number: 03/26/2022, 9:57 AM    Transition of Care Department [facility]) has reviewed patient and no TOC needs have been identified at this time. We will continue to monitor patient advancement through interdisciplinary progression rounds. If new patient transition needs arise, please place a TOC consult.

## 2022-03-26 NOTE — ED Triage Notes (Signed)
Pt with sudden onset of SOB, HR in 140s, diaphoretic, c/o chest tightness. Hx of asthma. Dr. Stark Jock and respiratory at bedside

## 2022-03-26 NOTE — H&P (Signed)
History and Physical    Patient: Cheryl Black VPX:[id number] DOB: 03-02-1985 DOA: 03/25/2022 DOS: the patient was seen and examined on 03/26/2022 PCP: [name], MD  Patient coming from: Home  Chief Complaint: Dyspnea  HPI: Cheryl Black is a 37 y.o. female with medical history significant of GERD, diabetes mellitus type 2, NASH, hyperlipidemia, PCOS, asthma, and more presents the ED with a chief complaint of dyspnea.  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  She was working on shift, and had symptoms of a viral URI with some dyspnea that was controlled with her rescue inhaler at the beginning of shift.  During the show she became acutely short of breath with wheezing, diaphoresis, chest tightness.  Patient reports she had palpitations.  Her heart rate was up to 140.  She reports diffuse chest tightness.  She was coughing with productive of yellow sputum yesterday, but she was not able to get up any sputum today.  Colleagues had to grab a hold of her to keep her from collapsing to the floor because she was so short of breath and lightheaded.  She was given breathing treatment and felt better, but then whenever she would move even to go to the bathroom her respiratory rate would jump up, her heart rate would elevate, and her symptoms would basically recur.  She was given a total of 3 breathing treatments while in the ED.  Patient reports that she has been having the symptoms for few days.  She did have a fever at home and took Tylenol and ibuprofen for it.  Tylenol did help the fever, but did not relieve any of her other symptoms.  She had lab drawn a COVID, flu, RSV on her yesterday which was negative.  Patient reports she has been using her rescue inhaler regularly, except for on her last that she forgot to bring it to work so she can use it that day.  This is the first time that she has had lightheadedness and near syncope with this illness.  She reports every time she is getting up since  the acute episode she is felt dizzy.  Patient has had asthma exacerbations in the past requiring BiPAP.  She has not been intubated.  She has no other complaints at this time.  Patient does not smoke, does not drink.  She is vaccinated for COVID and flu.  Patient is full code. Review of Systems: As mentioned in the history of present illness. All other systems reviewed and are negative. Past Medical History:  Diagnosis Date   Acid reflux    Asthma    Clostridium difficile infection    in the past   Diabetes mellitus    Fatty liver    Fibrocystic breast disease    Gout    Hidradenitis    History of kidney stones    Hyperlipemia    Obesity    Polycystic ovary syndrome    PONV (postoperative nausea and vomiting)    Respiratory failure ([place])    "2018 and 2019"   Sleep apnea    Smoker    Past Surgical History:  Procedure Laterality Date   CHOLECYSTECTOMY  01/10/2011   Procedure: LAPAROSCOPIC CHOLECYSTECTOMY;  Surgeon: Jamesetta So;  Location: [facility];  Service: General;  Laterality: N/A;   ESOPHAGOGASTRODUODENOSCOPY (EGD) WITH PROPOFOL N/A 09/09/2018   RMR: Mild erosive reflux esophagitis.  Esophagus dilated for history of dysphagia.   HYDRADENITIS EXCISION Right 06/18/2012   Procedure: EXCISION HYDRIADENITIS  [place];  Surgeon: Scherry Ran, MD;  Location: [facility];  Service: General;  Laterality: Right;   LIVER BIOPSY  01/10/2011   Procedure: LIVER BIOPSY;  Surgeon: Jamesetta So;  Location: [facility];  Service: General;;   MALONEY DILATION N/A 09/09/2018   Procedure: Venia Minks DILATION;  Surgeon: Daneil Dolin, MD;  Location: [facility];  Service: Endoscopy;  Laterality: N/A;   SKIN SPLIT GRAFT Right 06/18/2012   Procedure: SKIN GRAFT SPLIT THICKNESS RIGHT AXILLA(WITH TWO STANDARD SKIN BOARDS 3"x 8" )  DONOR SITE RIGHT AND LEFT THIGHS;  Surgeon: Scherry Ran, MD;  Location: [facility];  Service: General;  Laterality: Right;   Social History:  reports that she quit  smoking about 4 years ago. Her smoking use included cigarettes. She has a 10.00 pack-year smoking history. She has never used smokeless tobacco. She reports that she does not currently use alcohol. She reports that she does not use drugs.  Allergies  Allergen Reactions   Ace Inhibitors Anaphylaxis   Beta Adrenergic Blockers Other (See Comments)    Angioedema  Other reaction(s): Angioedema   Augmentin [Amoxicillin-Pot Clavulanate] Other (See Comments)    Has had C.diff in the past Did it involve swelling of the face/tongue/throat, SOB, or low BP? No Did it involve sudden or severe rash/hives, skin peeling, or any reaction on the inside of your mouth or nose? No Did you need to seek medical attention at a hospital or doctor's office? No When did it last happen?Unknown If all above answers are "NO", may proceed with cephalosporin use.    Benicar [Olmesartan] Other (See Comments)    Facial edema   Doxycycline Other (See Comments)    Makes feet and hands red   Egg-Derived Products     Local reaction with injectable meds that contain eggs   Influenza Vaccine Live Swelling    Arm    Influenza Virus Vaccine Hives   Invokana [Canagliflozin] Other (See Comments)    Dehydration    Liraglutide Nausea And Vomiting    Family History  Problem Relation Age of Onset   Stroke Other        paternal great grandfather   Diabetes Father    Hypertension Father    Cancer Maternal Grandfather        adenocarcinoma   Emphysema Maternal Grandfather        smoked   Heart attack Paternal Grandfather    Hypertension Paternal Grandmother    Diabetes Paternal Grandmother    Gout Paternal Grandmother    Hypertension Maternal Grandmother    Other Mother        pre-cancerous cells; had hyst   Emphysema Mother        smoker   Gout Brother    Cancer Sister        cervical; had hyst   Asthma Maternal Uncle    Colon cancer Neg Hx    Liver disease Neg Hx    Liver cancer Neg Hx     Prior to  Admission medications   Medication Sig Start Date End Date Taking? Authorizing Provider  acetaminophen (TYLENOL) 500 MG tablet Take 1,000 mg by mouth every 6 (six) hours as needed (for pain/headaches.).     [provider]  albuterol (VENTOLIN HFA) 108 (90 Base) MCG/ACT inhaler Inhale 2 puffs into the lungs every 4 (four) - 6 (six)  hours as needed for shortness of breath. 01/10/22     allopurinol (ZYLOPRIM) 300 MG tablet  TAKE 1 TABLET BY MOUTH ONCE DAILY 11/12/20     allopurinol (ZYLOPRIM) 300 MG tablet TAKE 1 TABLET BY MOUTH ONCE DAILY 06/10/21     azithromycin (ZITHROMAX) 250 MG tablet Take 1 tablet (250 mg total) by mouth daily. Take first 2 tablets together, then 1 every day until finished. 06/29/21   Leath-Warren, Alda Lea, NP  Blood Glucose Monitoring Suppl (FREESTYLE LITE) w/Device KIT Use to test blood sugar 01/10/22     butalbital-acetaminophen-caffeine (FIORICET, ESGIC) 50-325-40 MG tablet Take 1 tablet by mouth daily as needed (headaches.). 01/21/18   [provider]  cephALEXin (KEFLEX) 500 MG capsule Take 1 capsule (500 mg total) by mouth 2 (two) times daily. 04/19/21   Jaynee Eagles, PA-C  cetirizine-pseudoephedrine (ZYRTEC-D) 5-120 MG tablet Take 1 tablet by mouth daily. 06/28/20   Wurst, [place], PA-C  Continuous Blood Gluc Sensor (DEXCOM G6 SENSOR) MISC CHANGE SENSOR EVERY 10 DAYS. 12/25/20     Continuous Blood Gluc Sensor (DEXCOM G6 SENSOR) MISC Use as directed change every 10 days 06/27/21     Continuous Blood Gluc Sensor (DEXCOM G7 SENSOR) MISC Use as directed for blood glucose monitoring, replace every 10 days. 12/06/21     Continuous Blood Gluc Transmit (DEXCOM G6 TRANSMITTER) MISC Use as directed every 90 days 12/19/20     Continuous Blood Gluc Transmit (DEXCOM G6 TRANSMITTER) MISC Use as directed with Dexcom sensor, replace every 90 days 02/06/21     Continuous Blood Gluc Transmit (GUARDIAN TRANSMITTER) MISC Use as directed 06/27/21     dapagliflozin propanediol (FARXIGA)  10 MG TABS tablet Take 1 tablet by mouth once daily 06/10/21     dapagliflozin propanediol (FARXIGA) 10 MG TABS tablet Take 1 tablet by mouth once a day 06/27/21     fexofenadine (ALLEGRA ALLERGY) 180 MG tablet Take 1 tablet by mouth daily 11/12/20     fexofenadine (ALLEGRA) 180 MG tablet Take 1 tablet by mouth once every day 06/10/21     fluticasone (FLONASE) 50 MCG/ACT nasal spray Place 2 sprays into both nostrils daily.     [provider]  fluticasone (FLONASE) 50 MCG/ACT nasal spray Place 1 spray into both nostrils 2 (two) times daily. 12/22/21   Volney American, PA-C  Glucagon (GVOKE HYPOPEN 2-PACK) 1 MG/0.2ML SOAJ use under the skin for hypoglycemia as needed 01/31/22     glucose blood test strip Use 2-3 times daily to test blood sugar 01/10/22     hydrochlorothiazide (HYDRODIURIL) 12.5 MG tablet TAKE 1 TABLET BY MOUTH DAILY FOR HIGH BLOOD PRESSURE 11/12/20     hydrochlorothiazide (HYDRODIURIL) 12.5 MG tablet TAKE 1 TABLET BY MOUTH DAILY FOR HIGH BLOOD PRESSURE 06/10/21     insulin aspart (NOVOLOG FLEXPEN) 100 UNIT/ML FlexPen Inject 2-3 Units into the skin 3 (three) times daily with meals. 01/10/22     insulin degludec (TRESIBA FLEXTOUCH) 200 UNIT/ML FlexTouch Pen Inject 14 Units into the skin every evening. 01/31/22     Insulin Degludec (TRESIBA) 100 UNIT/ML SOLN Inject 6 Units into the skin every evening. 01/10/22     insulin lispro (HUMALOG KWIKPEN) 100 UNIT/ML KwikPen Inject 14-20 Units + correctional insulin into the skin 3 (three) times daily with meals. Max daily dose of 100 units. 01/31/22     Insulin Pen Needle 32G X 4 MM MISC Use as directed to inject insulin 4 times a day 01/10/22     Lancets (ONETOUCH DELICA PLUS 123XX123) MISC Use to test blood sugar 2-3 times daily 01/10/22     metFORMIN (  GLUCOPHAGE-XR) 500 MG 24 hr tablet Take 2 tablets by mouth once daily Patient taking differently: Take 1,000 mg by mouth. 06/21/20     metFORMIN (GLUCOPHAGE-XR) 500 MG 24 hr tablet TAKE 2 TABLETS BY  MOUTH ONCE DAILY 06/10/21     metFORMIN (GLUCOPHAGE-XR) 500 MG 24 hr tablet Take 2 tablets by mouth once a day 06/27/21     metoCLOPramide (REGLAN) 10 MG tablet Take 1 tablet by mouth every 6 (six) hours. 08/02/21     oseltamivir (TAMIFLU) 75 MG capsule Take 1 capsule (75 mg total) by mouth every 12 (twelve) hours. 12/11/21   Volney American, PA-C  pantoprazole (PROTONIX) 40 MG tablet Take 1 tablet by mouth twice a day 11/12/20     potassium chloride SA (KLOR-CON M) 20 MEQ tablet Take 2 tablets (40 mEq total) by mouth daily. 01/31/21   Horton, Barbette Hair, MD  pravastatin (PRAVACHOL) 20 MG tablet TAKE 1 TABLET BY MOUTH NIGHTLY FOR CHOLESTROL 11/12/20     pravastatin (PRAVACHOL) 20 MG tablet Take 1 tablet by mouth once every night for cholesterol 06/10/21     Probiotic, Lactobacillus, CAPS  11/28/19   [provider]  promethazine (PHENERGAN) 25 MG tablet Take 1 tablet (25 mg total) by mouth every 6 (six) hours as needed for nausea or vomiting. 01/31/21   Estill Dooms, NP  promethazine-dextromethorphan (PROMETHAZINE-DM) 6.25-15 MG/5ML syrup Take 5 mLs by mouth 4 (four) times daily as needed for cough. 06/29/21   Leath-Warren, Alda Lea, NP  promethazine-dextromethorphan (PROMETHAZINE-DM) 6.25-15 MG/5ML syrup Take 5 mLs by mouth 4 (four) times daily as needed. 12/11/21   Volney American, PA-C  promethazine-dextromethorphan (PROMETHAZINE-DM) 6.25-15 MG/5ML syrup Take 5 mLs by mouth 4 (four) times daily as needed. 12/22/21   Volney American, PA-C  Semaglutide, 1 MG/DOSE, (OZEMPIC, 1 MG/DOSE,) 4 MG/3ML SOPN Inject 1 mg into the skin once a week 05/13/21     Semaglutide, 1 MG/DOSE, (OZEMPIC, 1 MG/DOSE,) 4 MG/3ML SOPN Use as directed into the skin Once a week 06/27/21     tirzepatide [facility]) 2.5 MG/0.5ML Pen Inject 2.5 mg into the skin every 7 (seven) days. 01/31/22     tirzepatide (MOUNJARO) 5 MG/0.5ML Pen Inject 5 mg subcutaneous once weekly for 30 days. 02/28/22       Physical  Exam: Vitals:   03/26/22 0355 03/26/22 0400 03/26/22 0442 03/26/22 0444  BP: 107/62 113/66 110/64   Pulse: (!) 114 (!) 115 (!) 112   Resp: 16 (!) 23 (!) 22   Temp: 98.8 F (37.1 C)  97.8 F (36.6 C)   TempSrc: Oral  Oral   SpO2: 95% 95% 94%   Weight:    129.2 kg  Height:    5\' 8"  (1.727 m)   1.  General: Patient lying supine in bed,  no acute distress   2. Psychiatric: Alert and oriented x 3, mood and behavior normal for situation, pleasant and cooperative with exam   3. Neurologic: Speech and language are normal, face is symmetric, moves all 4 extremities voluntarily, at baseline without acute deficits on limited exam   4. HEENMT:  Head is atraumatic, normocephalic, pupils reactive to light, neck is supple, trachea is midline, mucous membranes are moist   5. Respiratory : Lungs are clear to auscultation bilaterally without wheezing, rhonchi, rales, no cyanosis, no increase in work of breathing or accessory muscle use   6. Cardiovascular : Heart rate minimally tachycardic, rhythm is regular, no murmurs, rubs or gallops, trace peripheral edema,  peripheral pulses palpated   7. Gastrointestinal:  Abdomen is soft, nondistended, nontender to palpation bowel sounds active, no masses or organomegaly palpated   8. Skin:  Skin is warm, dry and intact without rashes, acute lesions, or ulcers on limited exam   9.Musculoskeletal:  No acute deformities or trauma, no asymmetry in tone, trace peripheral edema, peripheral pulses palpated, no tenderness to palpation in the extremities  Data Reviewed: In the ED Temp 98.3-98.5, heart rate 111-141, respiratory rate 15-31, blood pressure 121/82-147/87, satting 93-100% Patient did require 2 L nasal cannula briefly, but is on room air at the time of my exam No leukocytosis with white blood cell count of 7.1, hemoglobin 16.3, platelets 223 Chemistry reveals a hypokalemia at 2.6, and a hyperglycemia at 201 BNP 10 Troponin less than 2 Chest  x-ray is not showing no active disease EKG shows a heart rate of 138, sinus tach, QTc is prolonged at 637 Patient was given Ativan, mag sulfate, Solu-Medrol, and 10 mEq of potassium in the ED Patient received 3 breathing treatments in the ED as well Admission was requested for acute asthma exacerbation Assessment and Plan: * Asthma exacerbation - With acute onset of wheezing, dyspnea - In the setting of recent viral URI symptoms - Received 3 breathing treatments in the ED - Continue as needed albuterol, scheduled DuoNeb, steroids - Currently maintaining oxygen sats on room air as long as she does not exert herself - No asymmetrical peripheral edema, no history of blood clot, no pleuritic pain - Continue to monitor  Hyperlipidemia - Continue statin  Prolonged QT interval - Likely related to hypokalemia - Monitor on telemetry - Avoid QT prolonging agents when possible - Continue to monitor  GERD (gastroesophageal reflux disease) - Continue Protonix  Hypokalemia - Likely related to HCTZ - Potassium 2.6 - Replace with 50 mEq - Recheck in the a.m., will likely need further replacement - Check magnesium in the a.m. as well - Continue to monitor  Diabetes mellitus type 2 in obese (HCC) - Currently only on Mounjaro at home - Given steroids for asthma exacerbation, anticipating elevated glucose - Sliding scale coverage - Every 4 hours CBG as patient reports she has had labile glucose with hypoglycemic episodes - Continue to monitor      Advance Care Planning:   Code Status: Full Code  Consults: None at this time  Family Communication: Husband at bedside  Severity of Illness: The appropriate patient status for this patient is INPATIENT. Inpatient status is judged to be reasonable and necessary in order to provide the required intensity of service to ensure the patient's safety. The patient's presenting symptoms, physical exam findings, and initial radiographic and laboratory  data in the context of their chronic comorbidities is felt to place them at high risk for further clinical deterioration. Furthermore, it is not anticipated that the patient will be medically stable for discharge from the hospital within 2 midnights of admission.   * I certify that at the point of admission it is my clinical judgment that the patient will require inpatient hospital care spanning beyond 2 midnights from the point of admission due to high intensity of service, high risk for further deterioration and high frequency of surveillance required.*  Author: Rolla Plate, DO 03/26/2022 5:12 AM  For on call review [email or web address].

## 2022-03-26 NOTE — Assessment & Plan Note (Signed)
Continue Protonix °

## 2022-03-27 ENCOUNTER — Other Ambulatory Visit: Payer: Self-pay

## 2022-03-27 ENCOUNTER — Other Ambulatory Visit (HOSPITAL_COMMUNITY): Payer: Self-pay

## 2022-03-27 DIAGNOSIS — J4541 Moderate persistent asthma with (acute) exacerbation: Secondary | ICD-10-CM | POA: Diagnosis not present

## 2022-03-27 DIAGNOSIS — E1165 Type 2 diabetes mellitus with hyperglycemia: Secondary | ICD-10-CM

## 2022-03-27 LAB — EXPECTORATED SPUTUM ASSESSMENT W GRAM STAIN, RFLX TO RESP C

## 2022-03-27 LAB — BASIC METABOLIC PANEL
Anion gap: 9 (ref 5–15)
BUN: 11 mg/dL (ref 6–20)
CO2: 25 mmol/L (ref 22–32)
Calcium: 8.4 mg/dL — ABNORMAL LOW (ref 8.9–10.3)
Chloride: 102 mmol/L (ref 98–111)
Creatinine, Ser: 0.64 mg/dL (ref 0.44–1.00)
GFR, Estimated: >60 mL/min (ref >60)
Glucose, Bld: 243 mg/dL — ABNORMAL HIGH (ref 70–99)
Potassium: 4 mmol/L (ref 3.5–5.1)
Sodium: 136 mmol/L (ref 135–145)

## 2022-03-27 LAB — GLUCOSE, CAPILLARY
Glucose-Capillary: 176 mg/dL — ABNORMAL HIGH (ref 70–99)
Glucose-Capillary: 262 mg/dL — ABNORMAL HIGH (ref 70–99)
Glucose-Capillary: 272 mg/dL — ABNORMAL HIGH (ref 70–99)

## 2022-03-27 MED ORDER — GUAIFENESIN ER 600 MG PO TB12
600.0000 mg | ORAL_TABLET | Freq: Two times a day (BID) | ORAL | Status: DC
  Administered 2022-03-27: 600 mg via ORAL
  Filled 2022-03-27: qty 1

## 2022-03-27 MED ORDER — PREDNISONE 20 MG PO TABS
40.0000 mg | ORAL_TABLET | Freq: Every day | ORAL | 0 refills | Status: AC
  Filled 2022-03-27: qty 8, 4d supply, fill #0

## 2022-03-27 MED ORDER — IPRATROPIUM-ALBUTEROL 0.5-2.5 (3) MG/3ML IN SOLN
3.0000 mL | Freq: Four times a day (QID) | RESPIRATORY_TRACT | 1 refills | Status: DC
  Filled 2022-03-27: qty 360, 30d supply, fill #0

## 2022-03-27 MED ORDER — GUAIFENESIN ER 600 MG PO TB12: 600.0000 mg | ORAL_TABLET | Freq: Two times a day (BID) | ORAL | Status: DC

## 2022-03-27 MED ORDER — ALBUTEROL SULFATE (2.5 MG/3ML) 0.083% IN NEBU
2.5000 mg | INHALATION_SOLUTION | RESPIRATORY_TRACT | 1 refills | Status: DC | PRN
  Filled 2022-03-27: qty 90, 3d supply, fill #0
  Filled 2022-05-04: qty 90, 3d supply, fill #1

## 2022-03-27 MED ORDER — TRESIBA FLEXTOUCH 200 UNIT/ML ~~LOC~~ SOPN: 10.0000 [IU] | PEN_INJECTOR | Freq: Every evening | SUBCUTANEOUS | 3 refills | Status: DC

## 2022-03-27 MED ORDER — HYDROXYZINE HCL 10 MG PO TABS
10.0000 mg | ORAL_TABLET | Freq: Once | ORAL | Status: AC | PRN
  Administered 2022-03-27: 10 mg via ORAL
  Filled 2022-03-27: qty 1

## 2022-03-27 NOTE — Plan of Care (Signed)

## 2022-03-27 NOTE — Progress Notes (Signed)
Called Resp to administered breathing treatment. Pt sates " I don't fell right",lungs clear, pt slightly flushed and intermittent cough, breathing treatment started and tolerating. SP, RN

## 2022-03-27 NOTE — Discharge Summary (Signed)
Physician Discharge Summary  Cheryl Black [id number] DOB: 1985/03/25 DOA: 03/25/2022  PCP: [name], MD  Admit date: 03/25/2022 Discharge date: 03/27/2022  Admitted From: home Discharge disposition: home   Recommendations for Outpatient Follow-Up:   Close follow up with PCP to ensure improvement BMP re: K   Discharge Diagnosis:   Principal Problem:   Asthma exacerbation Active Problems:   Diabetes mellitus type 2 in obese ([place])   Hypokalemia   GERD (gastroesophageal reflux disease)   Prolonged QT interval   Hyperlipidemia    Discharge Condition: Improved.  Diet recommendation: Carbohydrate-modified  Wound care: None.  Code status: Full.   History of Present Illness:    Cheryl Black is a 37 y.o. female with medical history significant of GERD, diabetes mellitus type 2, NASH, hyperlipidemia, PCOS, asthma, and more presents the ED with a chief complaint of dyspnea.  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  She was working on shift, and had symptoms of a viral URI with some dyspnea that was controlled with her rescue inhaler at the beginning of shift.  During the show she became acutely short of breath with wheezing, diaphoresis, chest tightness.  Patient reports she had palpitations.  Her heart rate was up to 140.  She reports diffuse chest tightness.  She was coughing with productive of yellow sputum yesterday, but she was not able to get up any sputum today.  Colleagues had to grab a hold of her to keep her from collapsing to the floor because she was so short of breath and lightheaded.  She was given breathing treatment and felt better, but then whenever she would move even to go to the bathroom her respiratory rate would jump up, her heart rate would elevate, and her symptoms would basically recur.  She was given a total of 3 breathing treatments while in the ED.  Patient reports that she has been having the symptoms for few days.  She did have a  fever at home and took Tylenol and ibuprofen for it.  Tylenol did help the fever, but did not relieve any of her other symptoms.  She had lab drawn a COVID, flu, RSV on her yesterday which was negative.  Patient reports she has been using her rescue inhaler regularly, except for on her last that she forgot to bring it to work so she can use it that day.  This is the first time that she has had lightheadedness and near syncope with this illness.  She reports every time she is getting up since the acute episode she is felt dizzy.  Patient has had asthma exacerbations in the past requiring BiPAP.  She has not been intubated.  She has no other complaints at this time.    Hospital Course by Problem:    Asthma exacerbation vs reactive airway disease - With acute onset of wheezing, dyspnea - In the setting of recent viral URI symptoms - Received 3 breathing treatments in the ED - Continue as needed albuterol, scheduled DuoNeb, steroid burst -- close outpatient follow up   Hyperlipidemia - Continue statin   Prolonged QT interval - Likely related to hypokalemia -replaced   GERD (gastroesophageal reflux disease) - Continue Protonix   Hypokalemia - Likely related to HCTZ -replaced-- defer to PCP if needs to stop HCTZ   Diabetes mellitus type 2 in obese ([facility]) - Currently only on Mounjaro at home - Given steroids for asthma exacerbation, anticipating elevated glucose -  will resume prior long acting and SSI which she has at home already while on the steroids    Medical Consultants:      Discharge Exam:   Vitals:   03/27/22 0419 03/27/22 0745  BP: 110/66   Pulse: 94   Resp: 20   Temp: 98.5 F (36.9 C)   SpO2: 95% 96%   Vitals:   03/27/22 0007 03/27/22 0028 03/27/22 0419 03/27/22 0745  BP: 116/71  110/66   Pulse: 98  94   Resp: (!) 22  20   Temp: 98.2 F (36.8 C)  98.5 F (36.9 C)   TempSrc: Oral  Oral   SpO2: 96% 95% 95% 96%  Weight:      Height:        General exam:  Appears calm and comfortable   The results of significant diagnostics from this hospitalization (including imaging, microbiology, ancillary and laboratory) are listed below for reference.     Procedures and Diagnostic Studies:   DG Chest Portable 1 View  Result Date: 03/26/2022 CLINICAL DATA:  Dyspnea EXAM: PORTABLE CHEST 1 VIEW COMPARISON:  03/20/2018 FINDINGS: The heart size and mediastinal contours are within normal limits. Both lungs are clear. The visualized skeletal structures are unremarkable. IMPRESSION: No active disease. Electronically Signed   By: Fidela Salisbury M.D.   On: 03/26/2022 00:14     Labs:   Basic Metabolic Panel: Recent Labs  Lab 03/25/22 2355 03/26/22 0455 03/26/22 1302 03/27/22 0455  NA 133* 133* 132* 136  K 2.6* 2.9* 3.8 4.0  CL 99 100 102 102  CO2 21* 18* 20* 25  GLUCOSE 201* 308* 247* 243*  BUN 7 7 6 11   CREATININE 0.77 0.74 0.66 0.64  CALCIUM 8.9 8.4* 8.3* 8.4*  MG  --  1.8  --   --    GFR Estimated Creatinine Clearance: 135.5 mL/min (by C-G formula based on SCr of 0.64 mg/dL). Liver Function Tests: Recent Labs  Lab 03/26/22 0455  AST 87*  ALT 110*  ALKPHOS 70  BILITOT 0.5  PROT 7.1  ALBUMIN 3.5   No results for input(s): "LIPASE", "AMYLASE" in the last 168 hours. No results for input(s): "AMMONIA" in the last 168 hours. Coagulation profile No results for input(s): "INR", "PROTIME" in the last 168 hours.  CBC: Recent Labs  Lab 03/25/22 2355 03/26/22 0455  WBC 7.1 6.3  NEUTROABS  --  5.6  HGB 16.3* 14.9  HCT 47.2* 43.2  MCV 88.7 89.4  PLT 223 225   Cardiac Enzymes: No results for input(s): "CKTOTAL", "CKMB", "CKMBINDEX", "TROPONINI" in the last 168 hours. BNP: Invalid input(s): "POCBNP" CBG: Recent Labs  Lab 03/26/22 1600 03/26/22 2014 03/27/22 0009 03/27/22 0420 03/27/22 0715  GLUCAP 277* 357* 272* 262* 176*   D-Dimer No results for input(s): "DDIMER" in the last 72 hours. Hgb A1c Recent Labs     03/26/22 0506  HGBA1C 7.2*   Lipid Profile No results for input(s): "CHOL", "HDL", "LDLCALC", "TRIG", "CHOLHDL", "LDLDIRECT" in the last 72 hours. Thyroid function studies No results for input(s): "TSH", "T4TOTAL", "T3FREE", "THYROIDAB" in the last 72 hours.  Invalid input(s): "FREET3" Anemia work up No results for input(s): "VITAMINB12", "FOLATE", "FERRITIN", "TIBC", "IRON", "RETICCTPCT" in the last 72 hours. Microbiology Recent Results (from the past 240 hour(s))  Expectorated Sputum Assessment w Gram Stain, Rflx to Resp Cult     Status: None   Collection Time: 03/26/22  2:15 AM   Specimen: Expectorated Sputum  Result Value Ref Range Status  Specimen Description EXPECTORATED SPUTUM  Final   Special Requests NONE  Final   Sputum evaluation   Final    Sputum specimen not acceptable for testing.  Please recollect.   RESULT CALLED TO, READ BACK BY AND VERIFIED WITH: SOPHIA PICKETT AT 0357 03/27/2022 BY T KENNEDY Performed at [facility], [address], [place], [place] [address]    Report Status 03/27/2022 FINAL  Final     Discharge Instructions:   Discharge Instructions     Diet - low sodium heart healthy   Complete by: As directed    Diet Carb Modified   Complete by: As directed    Discharge instructions   Complete by: As directed    Adjust insulin while on steroids as your blood sugar will be higher   Increase activity slowly   Complete by: As directed       Allergies as of 03/27/2022       Reactions   Ace Inhibitors Anaphylaxis   Beta Adrenergic Blockers Other (See Comments)   Angioedema Other reaction(s): Angioedema   Augmentin [amoxicillin-pot Clavulanate] Other (See Comments)   Has had C.diff in the past Did it involve swelling of the face/tongue/throat, SOB, or low BP? No Did it involve sudden or severe rash/hives, skin peeling, or any reaction on the inside of your mouth or nose? No Did you need to seek medical attention at a hospital or doctor's  office? No When did it last happen?Unknown If all above answers are "NO", may proceed with cephalosporin use.   Benicar [olmesartan] Other (See Comments)   Facial edema   Doxycycline Other (See Comments)   Makes feet and hands red   Egg-derived Products    Local reaction with injectable meds that contain eggs   Influenza Vaccine Live Swelling   Arm    Influenza Virus Vaccine Hives   Invokana [canagliflozin] Other (See Comments)   Dehydration   Liraglutide Nausea And Vomiting        Medication List     STOP taking these medications    Farxiga 10 MG Tabs tablet Generic drug: dapagliflozin propanediol   NovoLOG FlexPen 100 UNIT/ML FlexPen Generic drug: insulin aspart   Ozempic (1 MG/DOSE) 4 MG/3ML Sopn Generic drug: Semaglutide (1 MG/DOSE)       TAKE these medications    acetaminophen 500 MG tablet Commonly known as: TYLENOL Take 1,000 mg by mouth every 6 (six) hours as needed (for pain/headaches.).   albuterol 108 (90 Base) MCG/ACT inhaler Commonly known as: Ventolin HFA Inhale 2 puffs into the lungs every 4 (four) - 6 (six)  hours as needed for shortness of breath. What changed: Another medication with the same name was added. Make sure you understand how and when to take each.   albuterol (2.5 MG/3ML) 0.083% nebulizer solution Commonly known as: PROVENTIL Take 3 mLs (2.5 mg total) by nebulization every 2 (two) hours as needed for wheezing or shortness of breath. What changed: You were already taking a medication with the same name, and this prescription was added. Make sure you understand how and when to take each.   butalbital-acetaminophen-caffeine 50-325-40 MG tablet Commonly known as: FIORICET Take 1 tablet by mouth daily as needed (headaches.).   Dexcom G6 Sensor Misc CHANGE SENSOR EVERY 10 DAYS.   Dexcom G6 Sensor Misc Use as directed change every 10 days   Dexcom G7 Sensor Misc Use as directed for blood glucose monitoring, replace every 10 days.    Dexcom G6 Transmitter Misc Use as  directed every 90 days   Dexcom G6 Transmitter Misc Use as directed with Dexcom sensor, replace every 90 days   Dexcom G6 Transmitter Misc Use as directed   fexofenadine 180 MG tablet Commonly known as: Allegra Allergy Take 1 tablet by mouth daily   fluticasone 50 MCG/ACT nasal spray Commonly known as: FLONASE Place 2 sprays into both nostrils daily. What changed: Another medication with the same name was removed. Continue taking this medication, and follow the directions you see here.   freestyle lancets Use to test blood sugar 2-3 times daily   FREESTYLE LITE test strip Generic drug: glucose blood Use 2-3 times daily to test blood sugar   FreeStyle Lite w/Device Kit Use to test blood sugar   guaiFENesin 600 MG 12 hr tablet Commonly known as: MUCINEX Take 1 tablet (600 mg total) by mouth 2 (two) times daily.   Gvoke HypoPen 2-Pack 1 MG/0.2ML Soaj Generic drug: Glucagon use under the skin for hypoglycemia as needed   hydrochlorothiazide 12.5 MG tablet Commonly known as: HYDRODIURIL TAKE 1 TABLET BY MOUTH DAILY FOR HIGH BLOOD PRESSURE What changed: Another medication with the same name was removed. Continue taking this medication, and follow the directions you see here.   insulin lispro 100 UNIT/ML KwikPen Commonly known as: HumaLOG KwikPen Inject 14-20 Units + correctional insulin into the skin 3 (three) times daily with meals. Max daily dose of 100 units.   ipratropium-albuterol 0.5-2.5 (3) MG/3ML Soln Commonly known as: DUONEB Take 3 mLs by nebulization every 6 (six) hours.   metFORMIN 500 MG 24 hr tablet Commonly known as: GLUCOPHAGE-XR Take 2 tablets by mouth once daily What changed:  how much to take Another medication with the same name was removed. Continue taking this medication, and follow the directions you see here.   metoCLOPramide 10 MG tablet Commonly known as: REGLAN Take 1 tablet by mouth every 6 (six)  hours.   Mounjaro 5 MG/0.5ML Pen Generic drug: tirzepatide Inject 5 mg subcutaneous once weekly for 30 days. What changed: Another medication with the same name was removed. Continue taking this medication, and follow the directions you see here.   pantoprazole 40 MG tablet Commonly known as: Protonix Take 1 tablet by mouth twice a day   potassium chloride SA 20 MEQ tablet Commonly known as: KLOR-CON M Take 2 tablets (40 mEq total) by mouth daily.   pravastatin 20 MG tablet Commonly known as: PRAVACHOL Take 1 tablet by mouth once every night for cholesterol What changed:  how much to take how to take this when to take this additional instructions   predniSONE 20 MG tablet Commonly known as: DELTASONE Take 2 tablets (40 mg total) by mouth daily for 4 days. Start taking on: March 28, 2022   Probiotic (Lactobacillus) Caps   promethazine 25 MG tablet Commonly known as: PHENERGAN Take 1 tablet (25 mg total) by mouth every 6 (six) hours as needed for nausea or vomiting.   TechLite Pen Needles 32G X 4 MM Misc Generic drug: Insulin Pen Needle Use as directed to inject insulin 4 times a day   Tresiba FlexTouch 200 UNIT/ML FlexTouch Pen Generic drug: insulin degludec Inject 10 Units into the skin every evening. What changed:  how much to take Another medication with the same name was removed. Continue taking this medication, and follow the directions you see here.          Time coordinating discharge: 45 min  Signed:  Geradine Girt DO  [facility]  03/27/2022, 9:32 AM

## 2022-03-27 NOTE — Plan of Care (Signed)
  Problem: Education: Goal: Knowledge of General Education information will improve Description: Including pain rating scale, medication(s)/side effects and non-pharmacologic comfort measures 03/27/2022 1027 by Santa Lighter, RN Outcome: Adequate for Discharge 03/27/2022 1026 by Santa Lighter, RN Outcome: Progressing   Problem: Health Behavior/Discharge Planning: Goal: Ability to manage health-related needs will improve 03/27/2022 1027 by Santa Lighter, RN Outcome: Adequate for Discharge 03/27/2022 1026 by Santa Lighter, RN Outcome: Progressing   Problem: Clinical Measurements: Goal: Ability to maintain clinical measurements within normal limits will improve 03/27/2022 1027 by Santa Lighter, RN Outcome: Adequate for Discharge 03/27/2022 1026 by Santa Lighter, RN Outcome: Progressing Goal: Will remain free from infection Outcome: Adequate for Discharge Goal: Diagnostic test results will improve Outcome: Adequate for Discharge Goal: Respiratory complications will improve Outcome: Adequate for Discharge Goal: Cardiovascular complication will be avoided Outcome: Adequate for Discharge   Problem: Activity: Goal: Risk for activity intolerance will decrease Outcome: Adequate for Discharge   Problem: Nutrition: Goal: Adequate nutrition will be maintained Outcome: Adequate for Discharge   Problem: Coping: Goal: Level of anxiety will decrease Outcome: Adequate for Discharge   Problem: Elimination: Goal: Will not experience complications related to bowel motility Outcome: Adequate for Discharge Goal: Will not experience complications related to urinary retention Outcome: Adequate for Discharge   Problem: Pain Managment: Goal: General experience of comfort will improve Outcome: Adequate for Discharge   Problem: Safety: Goal: Ability to remain free from injury will improve Outcome: Adequate for Discharge   Problem: Skin Integrity: Goal: Risk for impaired skin integrity  will decrease Outcome: Adequate for Discharge   Problem: Education: Goal: Ability to describe self-care measures that may prevent or decrease complications (Diabetes Survival Skills Education) will improve Outcome: Adequate for Discharge Goal: Individualized Educational Video(s) Outcome: Adequate for Discharge   Problem: Coping: Goal: Ability to adjust to condition or change in health will improve Outcome: Adequate for Discharge   Problem: Fluid Volume: Goal: Ability to maintain a balanced intake and output will improve Outcome: Adequate for Discharge   Problem: Health Behavior/Discharge Planning: Goal: Ability to identify and utilize available resources and services will improve Outcome: Adequate for Discharge Goal: Ability to manage health-related needs will improve Outcome: Adequate for Discharge   Problem: Metabolic: Goal: Ability to maintain appropriate glucose levels will improve Outcome: Adequate for Discharge   Problem: Nutritional: Goal: Maintenance of adequate nutrition will improve Outcome: Adequate for Discharge Goal: Progress toward achieving an optimal weight will improve Outcome: Adequate for Discharge   Problem: Skin Integrity: Goal: Risk for impaired skin integrity will decrease Outcome: Adequate for Discharge   Problem: Tissue Perfusion: Goal: Adequacy of tissue perfusion will improve Outcome: Adequate for Discharge

## 2022-03-27 NOTE — Progress Notes (Signed)
Called to patient's room by RN, patient was having some "tightness", is what she described.  Patient's sat was good and BS were clear.  Went ahead and gave Duoneb treatment early.

## 2022-03-27 NOTE — Inpatient Diabetes Management (Signed)
Inpatient Diabetes Program Recommendations  AACE/ADA: New Consensus Statement on Inpatient Glycemic Control (2015)  Target Ranges:  Prepandial:   less than 140 mg/dL      Peak postprandial:   less than 180 mg/dL (1-2 hours)      Critically ill patients:  140 - 180 mg/dL   Lab Results  Component Value Date   GLUCAP 176 (H) 03/27/2022   HGBA1C 7.2 (H) 03/26/2022    Latest Reference Range & Units 03/26/22 07:31 03/26/22 11:11 03/26/22 16:00 03/26/22 20:14 03/27/22 00:09 03/27/22 04:20 03/27/22 07:15  Glucose-Capillary 70 - 99 mg/dL 344 (H) 258 (H) 277 (H) 357 (H) 272 (H) 262 (H) 176 (H)  (H): Data is abnormally high  Diabetes history: DM2 Outpatient Diabetes medications:  Tresiba 14 units am, 6 units pm Humalog 14-20 units tid meal coverage Farxiga 10 mg qd Mounjaro 5 mg q week Current orders for Inpatient glycemic control: Semglee 15 units qd, Novolog 0-15 units tid, 0-5 units hs, Solumedrol 125 mg qd  Inpatient Diabetes Program Recommendations:   Please consider: -Add Novolog 5 units tid meal coverage if eats 50%  Thank you, Bethena Roys E. Crystie Yanko, RN, MSN, CDE  Diabetes Coordinator Inpatient Glycemic Control Team Team Pager [phone] (8am-5pm) 03/27/2022 8:58 AM

## 2022-03-27 NOTE — Progress Notes (Signed)
Removed IV-CDI. Reviewed d/c paperwork with patient. Answered questions. Wheeled stable patient and belongings, including work note) to entrance to d/c to home

## 2022-03-27 NOTE — Progress Notes (Signed)
Pt coughed up large mucous plug, specimen sent to lab. Pt voiced relief and improved air exchange. SRP, RN

## 2022-03-31 ENCOUNTER — Other Ambulatory Visit: Payer: Self-pay

## 2022-03-31 ENCOUNTER — Other Ambulatory Visit (HOSPITAL_COMMUNITY): Payer: Self-pay

## 2022-03-31 DIAGNOSIS — G4733 Obstructive sleep apnea (adult) (pediatric): Secondary | ICD-10-CM | POA: Diagnosis not present

## 2022-03-31 DIAGNOSIS — E876 Hypokalemia: Secondary | ICD-10-CM | POA: Diagnosis not present

## 2022-03-31 MED ORDER — AZITHROMYCIN 250 MG PO TABS
ORAL_TABLET | ORAL | 0 refills | Status: DC
  Filled 2022-03-31: qty 6, 5d supply, fill #0

## 2022-03-31 MED ORDER — BUSPIRONE HCL 5 MG PO TABS
5.0000 mg | ORAL_TABLET | Freq: Every day | ORAL | 0 refills | Status: DC | PRN
  Filled 2022-03-31 (×2): qty 30, 30d supply, fill #0

## 2022-04-05 ENCOUNTER — Other Ambulatory Visit (HOSPITAL_COMMUNITY): Payer: Self-pay

## 2022-04-07 ENCOUNTER — Other Ambulatory Visit (HOSPITAL_COMMUNITY): Payer: Self-pay

## 2022-04-07 ENCOUNTER — Other Ambulatory Visit: Payer: Self-pay

## 2022-04-07 MED ORDER — METFORMIN HCL ER 500 MG PO TB24
1000.0000 mg | ORAL_TABLET | Freq: Every day | ORAL | 1 refills | Status: DC
  Filled 2022-04-07: qty 60, 30d supply, fill #0

## 2022-04-09 ENCOUNTER — Other Ambulatory Visit (HOSPITAL_COMMUNITY): Payer: Self-pay

## 2022-04-09 ENCOUNTER — Other Ambulatory Visit: Payer: Self-pay

## 2022-04-09 MED ORDER — PANTOPRAZOLE SODIUM 40 MG PO TBEC
40.0000 mg | DELAYED_RELEASE_TABLET | Freq: Two times a day (BID) | ORAL | 0 refills | Status: DC
  Filled 2022-04-09: qty 180, 90d supply, fill #0

## 2022-04-09 MED ORDER — HYDROCHLOROTHIAZIDE 12.5 MG PO TABS
12.5000 mg | ORAL_TABLET | Freq: Every day | ORAL | 0 refills | Status: DC
  Filled 2022-04-09: qty 90, 90d supply, fill #0

## 2022-04-09 MED ORDER — PRAVASTATIN SODIUM 20 MG PO TABS
20.0000 mg | ORAL_TABLET | Freq: Every evening | ORAL | 0 refills | Status: DC
  Filled 2022-04-09: qty 90, 90d supply, fill #0

## 2022-04-15 ENCOUNTER — Ambulatory Visit: Payer: Commercial Managed Care - PPO | Admitting: Internal Medicine

## 2022-04-15 ENCOUNTER — Other Ambulatory Visit (HOSPITAL_COMMUNITY): Payer: Self-pay

## 2022-04-15 ENCOUNTER — Encounter: Payer: Self-pay | Admitting: Internal Medicine

## 2022-04-15 ENCOUNTER — Other Ambulatory Visit: Payer: Self-pay

## 2022-04-15 VITALS — BP 110/70 | HR 101 | Temp 97.9°F | Ht 68.0 in | Wt 282.0 lb

## 2022-04-15 DIAGNOSIS — J301 Allergic rhinitis due to pollen: Secondary | ICD-10-CM | POA: Diagnosis not present

## 2022-04-15 DIAGNOSIS — K219 Gastro-esophageal reflux disease without esophagitis: Secondary | ICD-10-CM | POA: Diagnosis not present

## 2022-04-15 DIAGNOSIS — J4550 Severe persistent asthma, uncomplicated: Secondary | ICD-10-CM | POA: Diagnosis not present

## 2022-04-15 LAB — POCT EXHALED NITRIC OXIDE: FeNO level (ppb): 24

## 2022-04-15 MED ORDER — AZELASTINE-FLUTICASONE 137-50 MCG/ACT NA SUSP
1.0000 | Freq: Two times a day (BID) | NASAL | 5 refills | Status: DC
  Filled 2022-04-15: qty 23, 30d supply, fill #0
  Filled 2022-05-08 – 2022-06-01 (×2): qty 23, 30d supply, fill #1
  Filled 2022-07-02: qty 23, 30d supply, fill #2
  Filled 2022-10-18: qty 23, 30d supply, fill #3
  Filled 2022-11-12 – 2022-11-26 (×3): qty 23, 30d supply, fill #4
  Filled 2022-12-19 – 2023-01-02 (×3): qty 23, 30d supply, fill #5

## 2022-04-15 MED ORDER — LORATADINE 10 MG PO TABS
10.0000 mg | ORAL_TABLET | Freq: Every day | ORAL | 3 refills | Status: DC
  Filled 2022-04-15 – 2022-06-01 (×2): qty 90, 90d supply, fill #0
  Filled 2022-09-27: qty 90, 90d supply, fill #1
  Filled 2022-12-22 – 2023-01-02 (×2): qty 90, 90d supply, fill #2
  Filled 2023-03-26 – 2023-03-27 (×2): qty 90, 90d supply, fill #3

## 2022-04-15 MED ORDER — FLUTICASONE-SALMETEROL 115-21 MCG/ACT IN AERO
2.0000 | INHALATION_SPRAY | Freq: Two times a day (BID) | RESPIRATORY_TRACT | 5 refills | Status: DC
  Filled 2022-04-15: qty 12, 30d supply, fill #0
  Filled 2022-05-08 (×2): qty 12, 30d supply, fill #1
  Filled 2022-06-09: qty 12, 30d supply, fill #2
  Filled 2022-07-02: qty 12, 30d supply, fill #3

## 2022-04-15 NOTE — Progress Notes (Signed)
Cheryl Black    [id number]    May 13, 1985  Primary Care Physician:Hall, Kathleene Hazel, MD  Referring Physician: Benita Stabile, MD [address] [place],  [place] [address] Reason for Consultation: asthma Date of Consultation: 04/15/2022  Chief complaint:   Chief Complaint  Patient presents with   Consult    ED visit for asthma, occ chest tightness, productive cough ( white sputum ) random episodes.      HPI: ANNAELIZABETH Black is a 37 y.o. woman who presents for new patient evaluation for recurrent episodes of respiratory illness concerning for asthma.   No childhood respiratory disease. In 2019 and 2020 she had hypoxemic and hypercapnic respiratory failure related to pneumonia and was discharged on home oxygen. She had essentially no shortness of breath, respiratory symptoms in the interval 4 years. 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 Respiratory virus panel negative. Was given steroids and bronchodilators. Discharged on room air.   She takes albuterol inhaler about twice a week. Triggers come at rest, exertion, nothing specific. She feels over all she can do everything she wants to do from respiratory standpoint. Cannot identify what triggered any of her three episodes requiring hospitalization.    Also has OSA on CPAP, follows by [facility] family medicine. Feels it is well controlled.    Current Regimen: albuterol prn Asthma Triggers: unclear Exacerbations in the last year: 1 requiring hospitalized 3-4 requiring abx/steroids History of hospitalization or intubation: hospitalized 3 times requiring bipap in ICU, but never intubated.  Allergy Testing: yes previously elevated GERD: yes on PPI Allergic Rhinitis: yes allegra and flonase seasonally. ACT:      No data to display         FeNO: 24 ppb   Social history:  Occupation: ED Nurse Exposures: Lives with in laws, spouse, ongoing passive smoke  exposure, one dog.  Smoking history: quit in 2020. 15 pack years x 0/5 ppd - 7.5 pack years  Social History   Occupational History   Occupation: Engineer, civil (consulting)  Tobacco Use   Smoking status: Former    Packs/day: 1.00    Years: 10.00    Additional pack years: 0.00    Total pack years: 10.00    Types: Cigarettes    Quit date: 01/06/2018    Years since quitting: 4.2    Passive exposure: Current   Smokeless tobacco: Never  Vaping Use   Vaping Use: Never used  Substance and Sexual Activity   Alcohol use: Not Currently    Comment: occas   Drug use: No   Sexual activity: Yes    Birth control/protection: None    Relevant family history:  Family History  Problem Relation Age of Onset   Other Mother        pre-cancerous cells; had hyst   Emphysema Mother        smoker   COPD Mother    Diabetes Father    Hypertension Father    Cancer Sister        cervical; had hyst   Gout Brother    Hypertension Maternal Grandmother    Heart attack Maternal Grandfather    Cancer Maternal Grandfather        adenocarcinoma   Emphysema Maternal Grandfather        smoked   Hypertension Paternal Grandmother    Diabetes Paternal Grandmother    Gout Paternal Grandmother  Asthma Maternal Uncle    Stroke Other        paternal great grandfather   Colon cancer Neg Hx    Liver disease Neg Hx    Liver cancer Neg Hx     Past Medical History:  Diagnosis Date   Acid reflux    Asthma    Clostridium difficile infection    in the past   Diabetes mellitus    Fatty liver    Fibrocystic breast disease    Gout    Hidradenitis    History of kidney stones    Hyperlipemia    Obesity    Polycystic ovary syndrome    PONV (postoperative nausea and vomiting)    Respiratory failure    "2018 and 2019"   Sleep apnea    Smoker     Past Surgical History:  Procedure Laterality Date   CHOLECYSTECTOMY  01/10/2011   Procedure: LAPAROSCOPIC CHOLECYSTECTOMY;  Surgeon: Dalia Heading;  Location: [facility];   Service: General;  Laterality: N/A;   ESOPHAGOGASTRODUODENOSCOPY (EGD) WITH PROPOFOL N/A 09/09/2018   RMR: Mild erosive reflux esophagitis.  Esophagus dilated for history of dysphagia.   HYDRADENITIS EXCISION Right 06/18/2012   Procedure: EXCISION HYDRIADENITIS SUPRATIVA  RIGHT AXILLA;  Surgeon: Marlane Hatcher, MD;  Location: [facility];  Service: General;  Laterality: Right;   LIVER BIOPSY  01/10/2011   Procedure: LIVER BIOPSY;  Surgeon: Dalia Heading;  Location: [facility];  Service: General;;   MALONEY DILATION N/A 09/09/2018   Procedure: Elease Hashimoto DILATION;  Surgeon: Corbin Ade, MD;  Location: [facility] SU[facility];  Service: Endoscopy;  Laterality: N/A;   SKIN SPLIT GRAFT Right 06/18/2012   Procedure: SKIN GRAFT SPLIT THICKNESS RIGHT AXILLA(WITH TWO STANDARD SKIN BOARDS 3"x 8" )  DONOR SITE RIGHT AND LEFT THIGHS;  Surgeon: Marlane Hatcher, MD;  Location: [facility];  Service: General;  Laterality: Right;     Physical Exam: Blood pressure 110/70, pulse (!) 101, temperature 97.9 F (36.6 C), temperature source Oral, height 5\' 8"  (1.727 m), weight 282 lb (127.9 kg), SpO2 98 %. Gen:      No acute distress ENT:  cobblestoning in oropharynx, no nasal polyps, mucus membranes moist Lungs:    No increased respiratory effort, symmetric chest wall excursion, clear to auscultation bilaterally, no wheezes or crackles CV:         Regular rate and rhythm; no murmurs, rubs, or gallops.  No pedal edema Abd:      + bowel sounds; soft, non-tender; no distension MSK: no acute synovitis of DIP or PIP joints, no mechanics hands.  Skin:      Warm and dry; no rashes Neuro: normal speech, no focal facial asymmetry Psych: alert and oriented x3, normal mood and affect   Data Reviewed/Medical Decision Making:  Independent interpretation of tests: Imaging:  Review of patient's chest xray  images March 2024 revealed no acute process. The patient's images have been independently reviewed by me.    PFTs: I have personally  reviewed the patient's PFTs and normal pulmonary function in 2019    Latest Ref Rng & Units 09/23/2017    8:53 AM  PFT Results  FVC-Pre L 4.19   FVC-Predicted Pre % 98   FVC-Post L 4.43   FVC-Predicted Post % 104   Pre FEV1/FVC % % 84   Post FEV1/FCV % % 85   FEV1-Pre L 3.54   FEV1-Predicted Pre % 100   FEV1-Post L 3.78   DLCO uncorrected ml/min/mmHg  29.19   DLCO UNC% % 98   DLVA Predicted % 99   TLC L 6.60   TLC % Predicted % 116   RV % Predicted % 141     Labs:  Lab Results  Component Value Date   NA 136 03/27/2022   K 4.0 03/27/2022   CO2 25 03/27/2022   GLUCOSE 243 (H) 03/27/2022   BUN 11 03/27/2022   CREATININE 0.64 03/27/2022   CALCIUM 8.4 (L) 03/27/2022   GFRNONAA >60 03/27/2022   Lab Results  Component Value Date   WBC 6.3 03/26/2022   HGB 14.9 03/26/2022   HCT 43.2 03/26/2022   MCV 89.4 03/26/2022   PLT 225 03/26/2022   Absolute eosinophil count 190 in 2019 Region 2 allergy panel shows sensitivity to cat and dog dander, cockroach and timothy grass   Immunization status:  Immunization History  Administered Date(s) Administered   Moderna Sars-Covid-2 Vaccination 02/01/2019, 03/01/2019   Pneumococcal Polysaccharide-23 08/26/2017     I reviewed prior external note(s) from pulmonary, pcp, ED  I reviewed the result(s) of the labs and imaging as noted above.   I have ordered FeNO   Assessment:  Severe persistent asthma, not well controlled Allergic rhinitis, not well controlled GERD, controlled OSA on CPAP  Plan/Recommendations: FeNO obtained today 24 ppb  Start advair inhaler 2 puffs in the morning, 2 puffs at night, gargle after use.   Switch allegra to claritin once daily.  Switch flonase to dymista 1 spray each side of your nose. Continue PPI for GERD  We discussed disease management and progression at length today.    Return to Care: Return in about 3 months (around 07/15/2022).  Durel SaltsNikita Jeyla Bulger, MD Pulmonary and Critical Care  Medicine [facility] Office:[phone]  CC: Benita StabileHall, John Z, MD

## 2022-04-15 NOTE — Patient Instructions (Addendum)
Please schedule follow up scheduled with myself in 3 months.  If my schedule is not open yet, we will contact you with a reminder closer to that time. Please call [phone] if you haven't heard from [name] a month before.   Start advair inhaler 2 puffs in the morning, 2 puffs at night, gargle after use.   Switch allegra to claritin once daily.  Switch flonase to dymista 1 spray each side of your nose.  Dymista - 1 spray on each side of your nose twice a day for first week, then 1 spray on each side.   Instructions for use: If you also use a saline nasal spray or rinse, use that first. Position the head with the chin slightly tucked. Use the right hand to spray into the left nostril and the right hand to spray into the left nostril.   Point the bottle away from the septum of your nose (cartilage that divides the two sides of your nose).  Hold the nostril closed on the opposite side from where you will spray Spray once and gently sniff to pull the medicine into the higher parts of your nose.  Don't sniff too hard as the medicine will drain down the back of your throat instead. Repeat with a second spray on the same side if prescribed. Repeat on the other side of your nose.  By learning about asthma and how it can be controlled, you take an important step toward managing this disease. Work closely with your asthma care team to learn all you can about your asthma, how to avoid triggers, what your medications do, and how to take them correctly. With proper care, you can live free of asthma symptoms and maintain a normal, healthy lifestyle.   What is asthma? Asthma is a chronic disease that affects the airways of the lungs. During normal breathing, the bands of muscle that surround the airways are relaxed and air moves freely. During an asthma episode or "attack," there are three main changes that stop air from moving easily through the airways: The bands of muscle that surround the airways tighten  and make the airways narrow. This tightening is called bronchospasm.  The lining of the airways becomes swollen or inflamed.  The cells that line the airways produce more mucus, which is thicker than normal and clogs the airways.  These three factors - bronchospasm, inflammation, and mucus production - cause symptoms such as difficulty breathing, wheezing, and coughing.  What are the most common symptoms of asthma? Asthma symptoms are not the same for everyone. They can even change from episode to episode in the same person. Also, you may have only one symptom of asthma, such as cough, but another person may have all the symptoms of asthma. It is important to know all the symptoms of asthma and to be aware that your asthma can present in any of these ways at any time. The most common symptoms include: Coughing, especially at night  Shortness of breath  Wheezing  Chest tightness, pain, or pressure   Who is affected by asthma? Asthma affects 22 million Americans; about 6 million of these are children under age 37. People who have a family history of asthma have an increased risk of developing the disease. Asthma is also more common in people who have allergies or who are exposed to tobacco smoke. However, anyone can develop asthma at any time. Some people may have asthma all of their lives, while others may develop it as  adults.  What causes asthma? The airways in a person with asthma are very sensitive and react to many things, or "triggers." Contact with these triggers causes asthma symptoms. One of the most important parts of asthma control is to identify your triggers and then avoid them when possible. The only trigger you do not want to avoid is exercise. Pre-treatment with medicines before exercise can allow you to stay active yet avoid asthma symptoms. Common asthma triggers include: Infections (colds, viruses, flu, sinus infections)  Exercise  Weather (changes in temperature and/or  humidity, cold air)  Tobacco smoke  Allergens (dust mites, pollens, pets, mold spores, cockroaches, and sometimes foods)  Irritants (strong odors from cleaning products, perfume, wood smoke, air pollution)  Strong emotions such as crying or laughing hard  Some medications   How is asthma diagnosed? To diagnose asthma, your doctor will first review your medical history, family history, and symptoms. Your doctor will want to know any past history of breathing problems you may have had, as well as a family history of asthma, allergies, eczema (a bumpy, itchy skin rash caused by allergies), or other lung disease. It is important that you describe your symptoms in detail (cough, wheeze, shortness of breath, chest tightness), including when and how often they occur. The doctor will perform a physical examination and listen to your heart and lungs. He or she may also order breathing tests, allergy tests, blood tests, and chest and sinus X-rays. The tests will find out if you do have asthma and if there are any other conditions that are contributing factors.  How is asthma treated? Asthma can be controlled, but not cured. It is not normal to have frequent symptoms, trouble sleeping, or trouble completing tasks. Appropriate asthma care will prevent symptoms and visits to the emergency room and hospital. Asthma medicines are one of the mainstays of asthma treatment. The drugs used to treat asthma are explained below.  Anti-inflammatories: These are the most important drugs for most people with asthma. Anti-inflammatory drugs reduce swelling and mucus production in the airways. As a result, airways are less sensitive and less likely to react to triggers. These medications need to be taken daily and may need to be taken for several weeks before they begin to control asthma. Anti-inflammatory medicines lead to fewer symptoms, better airflow, less sensitive airways, less airway damage, and fewer asthma attacks. If  taken every day, they CONTROL or prevent asthma symptoms.   Bronchodilators: These drugs relax the muscle bands that tighten around the airways. This action opens the airways, letting more air in and out of the lungs and improving breathing. Bronchodilators also help clear mucus from the lungs. As the airways open, the mucus moves more freely and can be coughed out more easily. In short-acting forms, bronchodilators RELIEVE or stop asthma symptoms by quickly opening the airways and are very helpful during an asthma episode. In long-acting forms, bronchodilators provide CONTROL of asthma symptoms and prevent asthma episodes.  Asthma drugs can be taken in a variety of ways. Inhaling the medications by using a metered dose inhaler, dry powder inhaler, or nebulizer is one way of taking asthma medicines. Oral medicines (pills or liquids you swallow) may also be prescribed.  Asthma severity Asthma is classified as either "intermittent" (comes and goes) or "persistent" (lasting). Persistent asthma is further described as being mild, moderate, or severe. The severity of asthma is based on how often you have symptoms both during the day and night, as well as by the  results of lung function tests and by how well you can perform activities. The "severity" of asthma refers to how "intense" or "strong" your asthma is.  Asthma control Asthma control is the goal of asthma treatment. Regardless of your asthma severity, it may or may not be controlled. Asthma control means: You are able to do everything you want to do at work and home  You have no (or minimal) asthma symptoms  You do not wake up from your sleep or earlier than usual in the morning due to asthma  You rarely need to use your reliever medicine (inhaler)  Another major part of your treatment is that you are happy with your asthma care and believe your asthma is controlled.  Monitoring symptoms A key part of treatment is keeping track of how well your  lungs are working. Monitoring your symptoms, what they are, how and when they happen, and how severe they are, is an important part of being able to control your asthma.  Sometimes asthma is monitored using a peak flow meter. A peak flow (PF) meter measures how fast the air comes out of your lungs. It can help you know when your asthma is getting worse, sometimes even before you have symptoms. By taking daily peak flow readings, you can learn when to adjust medications to keep asthma under good control. It is also used to create your asthma action plan (see below). Your doctor can use your peak flow readings to adjust your treatment plan in some cases.  Asthma Action Plan Based on your history and asthma severity, you and your doctor will develop a care plan called an "asthma action plan." The asthma action plan describes when and how to use your medicines, actions to take when asthma worsens, and when to seek emergency care. Make sure you understand this plan. If you do not, ask your asthma care provider any questions you may have. Your asthma action plan is one of the keys to controlling asthma. Keep it readily available to remind you of what you need to do every day to control asthma and what you need to do when symptoms occur.  Goals of asthma therapy These are the goals of asthma treatment: Live an active, normal life  Prevent chronic and troublesome symptoms  Attend work or school every day  Perform daily activities without difficulty  Stop urgent visits to the doctor, emergency department, or hospital  Use and adjust medications to control asthma with few or no side effects

## 2022-04-21 DIAGNOSIS — G4733 Obstructive sleep apnea (adult) (pediatric): Secondary | ICD-10-CM | POA: Diagnosis not present

## 2022-04-23 ENCOUNTER — Other Ambulatory Visit: Payer: Self-pay

## 2022-04-24 ENCOUNTER — Other Ambulatory Visit (HOSPITAL_COMMUNITY): Payer: Self-pay

## 2022-04-24 ENCOUNTER — Other Ambulatory Visit: Payer: Self-pay

## 2022-04-25 ENCOUNTER — Ambulatory Visit: Payer: Commercial Managed Care - PPO | Admitting: Medical

## 2022-04-25 ENCOUNTER — Other Ambulatory Visit: Payer: Self-pay

## 2022-04-25 ENCOUNTER — Other Ambulatory Visit (HOSPITAL_COMMUNITY): Payer: Self-pay

## 2022-04-25 ENCOUNTER — Encounter: Payer: Self-pay | Admitting: Medical

## 2022-04-25 VITALS — BP 124/87 | HR 83 | Ht 68.0 in | Wt 282.0 lb

## 2022-04-25 DIAGNOSIS — N939 Abnormal uterine and vaginal bleeding, unspecified: Secondary | ICD-10-CM | POA: Diagnosis not present

## 2022-04-25 MED ORDER — MEGESTROL ACETATE 40 MG PO TABS
40.0000 mg | ORAL_TABLET | Freq: Two times a day (BID) | ORAL | 1 refills | Status: DC
  Filled 2022-04-25: qty 28, 14d supply, fill #0
  Filled 2022-06-03: qty 28, 14d supply, fill #1

## 2022-04-25 NOTE — Progress Notes (Signed)
History:  [name] is a 37 y.o. G0P0000 who presents to clinic today for abnormal uterine bleeding. Patient states history of amenorrhea, PCOS and DM. She has been spotting since February every day. Bleeding is slightly heavier after intercourse, but otherwise noted with wiping. She is currently sexually active with one long-term female partner. She does not use anything for birth control. She denies abnormal discharge, pain, UTI symptoms, weakness, dizziness or fatigue today. She has had prolonged periods in the past that responded to medication. She declines the need for STD testing today.   The following portions of the patient's history were reviewed and updated as appropriate: allergies, current medications, family history, past medical history, social history, past surgical history and problem list.  Review of Systems:  Review of Systems  Constitutional:  Negative for fever and malaise/fatigue.  Gastrointestinal:  Negative for abdominal pain, constipation, diarrhea, nausea and vomiting.  Genitourinary:  Negative for dysuria, frequency and urgency.       Neg -discharge, pelvic pain + vaginal bleeding     Objective:  Physical Exam BP 124/87 (BP Location: Right Arm, Patient Position: Sitting, Cuff Size: Large)   Pulse 83   Ht  (1.727 m)   Wt 282 lb (127.9 kg)   BMI 42.88 kg/m  Physical Exam Vitals and nursing note reviewed. Exam conducted with a chaperone present.  Constitutional:      General: She is not in acute distress.    Appearance: Normal appearance. She is well-developed. She is obese.  HENT:     Head: Normocephalic and atraumatic.  Cardiovascular:     Rate and Rhythm: Normal rate.  Pulmonary:     Effort: Pulmonary effort is normal.  Abdominal:     General: Abdomen is flat. There is no distension.     Palpations: Abdomen is soft. There is no mass.     Tenderness: There is no abdominal tenderness. There is no guarding or rebound.  Genitourinary:     General: Normal vulva.     Vagina: Bleeding (small mix of brown and pink) present. No vaginal discharge, erythema or tenderness.     Cervix: Cervical bleeding (small, mucous) present. No cervical motion tenderness, discharge, friability, lesion or erythema.     Uterus: Not enlarged and not tender.      Adnexa:        Right: No mass or tenderness.         Left: Tenderness (mild) present. No mass.    Skin:    General: Skin is warm and dry.     Findings: No erythema.  Neurological:     Mental Status: She is alert and oriented to person, place, and time.  Psychiatric:        Mood and Affect: Mood normal.     Health Maintenance Due  Topic Date Due   FOOT EXAM  Never done   OPHTHALMOLOGY EXAM  Never done   Diabetic kidney evaluation - Urine ACR  Never done   DTaP/Tdap/Td (1 - Tdap) Never done   COVID-19 Vaccine (3 - 2023-24 season) 09/06/2021    Labs, imaging and previous visits in Epic and Care Everywhere reviewed  Assessment & Plan:  1. Abnormal uterine bleeding (AUB) - megestrol (MEGACE) 40 MG tablet; Take 1 tablet (40 mg total) by mouth 2 (two) times daily.  Dispense: 28 tablet; Refill: 1 - Advised this should stop bleeding and to complete course - May have withdrawal bleed a few weeks after completion - Discussed importance  of having a bleed at least every 3 months to avoid increased risk of endometrial cancer  - Patient will monitor bleeding pattern and report need for provera challenge Rx  - Otherwise return PRN or as planned for routine health maintenance  Return if symptoms worsen or fail to improve.  Marny Lowenstein, PA-C 04/25/2022 10:01 AM

## 2022-04-28 ENCOUNTER — Other Ambulatory Visit (HOSPITAL_COMMUNITY): Payer: Self-pay

## 2022-04-29 ENCOUNTER — Other Ambulatory Visit: Payer: Self-pay

## 2022-05-01 DIAGNOSIS — G4733 Obstructive sleep apnea (adult) (pediatric): Secondary | ICD-10-CM | POA: Diagnosis not present

## 2022-05-04 ENCOUNTER — Other Ambulatory Visit (HOSPITAL_COMMUNITY): Payer: Self-pay

## 2022-05-05 ENCOUNTER — Other Ambulatory Visit: Payer: Self-pay

## 2022-05-05 ENCOUNTER — Other Ambulatory Visit (HOSPITAL_COMMUNITY): Payer: Self-pay

## 2022-05-06 ENCOUNTER — Other Ambulatory Visit (HOSPITAL_COMMUNITY): Payer: Self-pay

## 2022-05-06 ENCOUNTER — Other Ambulatory Visit: Payer: Self-pay

## 2022-05-06 MED ORDER — METFORMIN HCL ER 500 MG PO TB24
1000.0000 mg | ORAL_TABLET | Freq: Every day | ORAL | 1 refills | Status: DC
  Filled 2022-05-06: qty 30, 15d supply, fill #0
  Filled 2022-05-22: qty 30, 15d supply, fill #1

## 2022-05-06 MED ORDER — MOUNJARO 2.5 MG/0.5ML ~~LOC~~ SOAJ
2.5000 mg | SUBCUTANEOUS | 3 refills | Status: DC
  Filled 2022-05-06: qty 3, 42d supply, fill #0
  Filled 2022-06-28 – 2022-10-18 (×2): qty 2, 28d supply, fill #0

## 2022-05-07 ENCOUNTER — Other Ambulatory Visit (HOSPITAL_COMMUNITY): Payer: Self-pay

## 2022-05-07 MED ORDER — FEXOFENADINE HCL 180 MG PO TABS: ORAL_TABLET | ORAL | 2 refills | Status: DC

## 2022-05-08 ENCOUNTER — Other Ambulatory Visit (HOSPITAL_COMMUNITY): Payer: Self-pay

## 2022-05-12 ENCOUNTER — Other Ambulatory Visit (HOSPITAL_COMMUNITY): Payer: Self-pay

## 2022-05-12 ENCOUNTER — Other Ambulatory Visit: Payer: Self-pay

## 2022-05-14 ENCOUNTER — Other Ambulatory Visit (HOSPITAL_COMMUNITY): Payer: Self-pay

## 2022-05-22 ENCOUNTER — Other Ambulatory Visit: Payer: Self-pay

## 2022-06-01 ENCOUNTER — Other Ambulatory Visit (HOSPITAL_COMMUNITY): Payer: Self-pay

## 2022-06-02 ENCOUNTER — Other Ambulatory Visit: Payer: Self-pay

## 2022-06-03 ENCOUNTER — Telehealth: Payer: Self-pay | Admitting: Adult Health

## 2022-06-03 ENCOUNTER — Other Ambulatory Visit: Payer: Self-pay

## 2022-06-03 NOTE — Telephone Encounter (Addendum)
Pt finished Megace on 5/22 and started bleeding on 5/25. Please advise. Thanks! JSY

## 2022-06-03 NOTE — Telephone Encounter (Signed)
Patient finished megace on 6/22, then 3 days later on 6/25 started her period. Please advise.

## 2022-06-03 NOTE — Telephone Encounter (Signed)
Pt advised to restart Megace. Pt voiced understanding. JSY

## 2022-06-04 ENCOUNTER — Other Ambulatory Visit (HOSPITAL_COMMUNITY): Payer: Self-pay

## 2022-06-04 MED ORDER — METFORMIN HCL ER 500 MG PO TB24
1000.0000 mg | ORAL_TABLET | Freq: Every day | ORAL | 1 refills | Status: DC
  Filled 2022-06-04: qty 30, 15d supply, fill #0
  Filled 2022-07-02: qty 30, 15d supply, fill #1

## 2022-06-06 ENCOUNTER — Other Ambulatory Visit (HOSPITAL_COMMUNITY): Payer: Self-pay

## 2022-06-06 ENCOUNTER — Other Ambulatory Visit: Payer: Self-pay

## 2022-06-06 DIAGNOSIS — I10 Essential (primary) hypertension: Secondary | ICD-10-CM | POA: Diagnosis not present

## 2022-06-06 DIAGNOSIS — E1165 Type 2 diabetes mellitus with hyperglycemia: Secondary | ICD-10-CM | POA: Diagnosis not present

## 2022-06-06 DIAGNOSIS — E785 Hyperlipidemia, unspecified: Secondary | ICD-10-CM | POA: Diagnosis not present

## 2022-06-06 MED ORDER — DAPAGLIFLOZIN PROPANEDIOL 5 MG PO TABS
5.0000 mg | ORAL_TABLET | Freq: Every day | ORAL | 5 refills | Status: DC
  Filled 2022-06-06: qty 30, 30d supply, fill #0
  Filled 2022-07-02: qty 30, 30d supply, fill #1
  Filled 2022-08-03: qty 30, 30d supply, fill #2
  Filled 2022-09-06: qty 30, 30d supply, fill #3
  Filled 2022-10-04: qty 30, 30d supply, fill #4
  Filled 2022-11-01 – 2022-11-26 (×3): qty 30, 30d supply, fill #5

## 2022-06-06 MED ORDER — METFORMIN HCL ER 500 MG PO TB24
1000.0000 mg | ORAL_TABLET | Freq: Every day | ORAL | 1 refills | Status: DC
  Filled 2022-06-06: qty 180, 90d supply, fill #0

## 2022-06-09 ENCOUNTER — Other Ambulatory Visit: Payer: Self-pay

## 2022-06-09 ENCOUNTER — Other Ambulatory Visit (HOSPITAL_COMMUNITY): Payer: Self-pay

## 2022-06-26 DIAGNOSIS — N939 Abnormal uterine and vaginal bleeding, unspecified: Secondary | ICD-10-CM

## 2022-06-27 DIAGNOSIS — Z1283 Encounter for screening for malignant neoplasm of skin: Secondary | ICD-10-CM | POA: Diagnosis not present

## 2022-06-27 DIAGNOSIS — D485 Neoplasm of uncertain behavior of skin: Secondary | ICD-10-CM | POA: Diagnosis not present

## 2022-06-27 DIAGNOSIS — D2271 Melanocytic nevi of right lower limb, including hip: Secondary | ICD-10-CM | POA: Diagnosis not present

## 2022-06-27 DIAGNOSIS — D225 Melanocytic nevi of trunk: Secondary | ICD-10-CM | POA: Diagnosis not present

## 2022-06-27 DIAGNOSIS — L732 Hidradenitis suppurativa: Secondary | ICD-10-CM | POA: Diagnosis not present

## 2022-06-27 MED ORDER — MEGESTROL ACETATE 40 MG PO TABS
120.0000 mg | ORAL_TABLET | Freq: Every day | ORAL | 5 refills | Status: DC
Start: 2022-06-27 — End: 2022-06-29

## 2022-06-28 ENCOUNTER — Other Ambulatory Visit (HOSPITAL_COMMUNITY): Payer: Self-pay

## 2022-06-29 MED ORDER — MEGESTROL ACETATE 40 MG PO TABS
120.0000 mg | ORAL_TABLET | Freq: Every day | ORAL | 5 refills | Status: DC
Start: 2022-06-29 — End: 2022-09-05
  Filled 2022-06-29 – 2022-07-25 (×2): qty 72, 24d supply, fill #0

## 2022-06-29 NOTE — Addendum Note (Signed)
Addended by: Lazaro Arms on: 06/29/2022 09:34 PM   Modules accepted: Orders

## 2022-06-30 ENCOUNTER — Other Ambulatory Visit: Payer: Self-pay

## 2022-06-30 ENCOUNTER — Other Ambulatory Visit (HOSPITAL_COMMUNITY): Payer: Self-pay

## 2022-06-30 DIAGNOSIS — I10 Essential (primary) hypertension: Secondary | ICD-10-CM | POA: Diagnosis not present

## 2022-06-30 DIAGNOSIS — E1165 Type 2 diabetes mellitus with hyperglycemia: Secondary | ICD-10-CM | POA: Diagnosis not present

## 2022-06-30 MED ORDER — ALLOPURINOL 300 MG PO TABS
300.0000 mg | ORAL_TABLET | Freq: Every day | ORAL | 2 refills | Status: DC
  Filled 2022-06-30: qty 90, 90d supply, fill #0
  Filled 2022-09-27: qty 90, 90d supply, fill #1
  Filled 2022-12-22 – 2023-01-02 (×2): qty 90, 90d supply, fill #2

## 2022-06-30 MED ORDER — COLCHICINE 0.6 MG PO TABS
ORAL_TABLET | ORAL | 1 refills | Status: DC
  Filled 2022-06-30: qty 3, 1d supply, fill #0
  Filled 2022-10-18: qty 3, 1d supply, fill #1

## 2022-07-01 ENCOUNTER — Other Ambulatory Visit: Payer: Self-pay

## 2022-07-01 DIAGNOSIS — D485 Neoplasm of uncertain behavior of skin: Secondary | ICD-10-CM | POA: Diagnosis not present

## 2022-07-02 ENCOUNTER — Other Ambulatory Visit (HOSPITAL_COMMUNITY): Payer: Self-pay

## 2022-07-02 ENCOUNTER — Other Ambulatory Visit: Payer: Self-pay

## 2022-07-02 MED ORDER — HYDROCHLOROTHIAZIDE 12.5 MG PO TABS
12.5000 mg | ORAL_TABLET | Freq: Every day | ORAL | 0 refills | Status: DC
  Filled 2022-07-02: qty 90, 90d supply, fill #0

## 2022-07-02 MED ORDER — PANTOPRAZOLE SODIUM 40 MG PO TBEC
40.0000 mg | DELAYED_RELEASE_TABLET | Freq: Two times a day (BID) | ORAL | 0 refills | Status: DC
  Filled 2022-07-02: qty 180, 90d supply, fill #0

## 2022-07-03 ENCOUNTER — Other Ambulatory Visit (HOSPITAL_COMMUNITY): Payer: Self-pay

## 2022-07-03 ENCOUNTER — Other Ambulatory Visit: Payer: Self-pay

## 2022-07-04 ENCOUNTER — Other Ambulatory Visit (HOSPITAL_COMMUNITY): Payer: Self-pay

## 2022-07-04 ENCOUNTER — Other Ambulatory Visit: Payer: Self-pay

## 2022-07-04 MED ORDER — PRAVASTATIN SODIUM 20 MG PO TABS
20.0000 mg | ORAL_TABLET | Freq: Every evening | ORAL | 2 refills | Status: DC
  Filled 2022-07-04: qty 90, 90d supply, fill #0
  Filled 2022-10-18: qty 90, 90d supply, fill #1
  Filled 2023-01-12: qty 90, 90d supply, fill #2

## 2022-07-04 MED ORDER — PANTOPRAZOLE SODIUM 40 MG PO TBEC
40.0000 mg | DELAYED_RELEASE_TABLET | Freq: Two times a day (BID) | ORAL | 2 refills | Status: DC
  Filled 2022-10-02: qty 180, 90d supply, fill #0
  Filled 2022-12-29 – 2023-01-02 (×2): qty 180, 90d supply, fill #1
  Filled 2023-03-26 – 2023-03-27 (×2): qty 180, 90d supply, fill #2

## 2022-07-04 MED ORDER — ALLOPURINOL 300 MG PO TABS: 300.0000 mg | ORAL_TABLET | Freq: Every day | ORAL | 2 refills | Status: DC

## 2022-07-04 MED ORDER — HYDROCHLOROTHIAZIDE 12.5 MG PO TABS
12.5000 mg | ORAL_TABLET | Freq: Every day | ORAL | 2 refills | Status: DC
  Filled 2022-10-02: qty 90, 90d supply, fill #0
  Filled 2022-12-29 – 2023-01-02 (×2): qty 90, 90d supply, fill #1

## 2022-07-04 MED ORDER — POTASSIUM CHLORIDE ER 10 MEQ PO TBCR
10.0000 meq | EXTENDED_RELEASE_TABLET | Freq: Every day | ORAL | 2 refills | Status: DC
  Filled 2022-07-04: qty 90, 90d supply, fill #0
  Filled 2022-10-04: qty 90, 90d supply, fill #1
  Filled 2022-12-29 – 2023-01-02 (×2): qty 90, 90d supply, fill #2

## 2022-07-07 ENCOUNTER — Other Ambulatory Visit (HOSPITAL_COMMUNITY): Payer: Self-pay

## 2022-07-08 ENCOUNTER — Encounter (HOSPITAL_COMMUNITY): Payer: Self-pay | Admitting: *Deleted

## 2022-07-08 ENCOUNTER — Other Ambulatory Visit (HOSPITAL_COMMUNITY): Payer: Self-pay

## 2022-07-08 ENCOUNTER — Other Ambulatory Visit: Payer: Self-pay

## 2022-07-08 ENCOUNTER — Emergency Department (HOSPITAL_COMMUNITY): Payer: Commercial Managed Care - PPO

## 2022-07-08 ENCOUNTER — Emergency Department (HOSPITAL_COMMUNITY)
Admission: EM | Admit: 2022-07-08 | Discharge: 2022-07-08 | Disposition: A | Discharge status: Home / Self Care | Payer: Commercial Managed Care - PPO | Source: Home / Self Care | Attending: Emergency Medicine | Admitting: Emergency Medicine

## 2022-07-08 DIAGNOSIS — Z7984 Long term (current) use of oral hypoglycemic drugs: Secondary | ICD-10-CM | POA: Insufficient documentation

## 2022-07-08 DIAGNOSIS — R112 Nausea with vomiting, unspecified: Secondary | ICD-10-CM | POA: Diagnosis not present

## 2022-07-08 DIAGNOSIS — Z794 Long term (current) use of insulin: Secondary | ICD-10-CM | POA: Insufficient documentation

## 2022-07-08 DIAGNOSIS — E119 Type 2 diabetes mellitus without complications: Secondary | ICD-10-CM | POA: Insufficient documentation

## 2022-07-08 DIAGNOSIS — A419 Sepsis, unspecified organism: Secondary | ICD-10-CM | POA: Diagnosis not present

## 2022-07-08 DIAGNOSIS — L03112 Cellulitis of left axilla: Secondary | ICD-10-CM | POA: Insufficient documentation

## 2022-07-08 DIAGNOSIS — L732 Hidradenitis suppurativa: Secondary | ICD-10-CM | POA: Insufficient documentation

## 2022-07-08 DIAGNOSIS — M7989 Other specified soft tissue disorders: Secondary | ICD-10-CM | POA: Diagnosis not present

## 2022-07-08 LAB — BASIC METABOLIC PANEL
Anion gap: 9 (ref 5–15)
BUN: 7 mg/dL (ref 6–20)
CO2: 23 mmol/L (ref 22–32)
Calcium: 8.9 mg/dL (ref 8.9–10.3)
Chloride: 104 mmol/L (ref 98–111)
Creatinine, Ser: 0.82 mg/dL (ref 0.44–1.00)
GFR, Estimated: >60 mL/min (ref >60)
Glucose, Bld: 164 mg/dL — ABNORMAL HIGH (ref 70–99)
Potassium: 3.4 mmol/L — ABNORMAL LOW (ref 3.5–5.1)
Sodium: 136 mmol/L (ref 135–145)

## 2022-07-08 LAB — CBC WITH DIFFERENTIAL/PLATELET
Abs Immature Granulocytes: 0.02 10*3/uL (ref 0.00–0.07)
Basophils Absolute: 0.1 10*3/uL (ref 0.0–0.1)
Basophils Relative: 1 %
Eosinophils Absolute: 0.3 10*3/uL (ref 0.0–0.5)
Eosinophils Relative: 3 %
HCT: 46.6 % — ABNORMAL HIGH (ref 36.0–46.0)
Hemoglobin: 15.8 g/dL — ABNORMAL HIGH (ref 12.0–15.0)
Immature Granulocytes: 0 %
Lymphocytes Relative: 23 %
Lymphs Abs: 2.3 10*3/uL (ref 0.7–4.0)
MCH: 30.6 pg (ref 26.0–34.0)
MCHC: 33.9 g/dL (ref 30.0–36.0)
MCV: 90.3 fL (ref 80.0–100.0)
Monocytes Absolute: 0.6 10*3/uL (ref 0.1–1.0)
Monocytes Relative: 6 %
Neutro Abs: 6.8 10*3/uL (ref 1.7–7.7)
Neutrophils Relative %: 67 %
Platelets: 264 10*3/uL (ref 150–400)
RBC: 5.16 MIL/uL — ABNORMAL HIGH (ref 3.87–5.11)
RDW: 12.8 % (ref 11.5–15.5)
WBC: 10 10*3/uL (ref 4.0–10.5)
nRBC: 0 % (ref 0.0–0.2)

## 2022-07-08 MED ORDER — TRANEXAMIC ACID 650 MG PO TABS
1300.0000 mg | ORAL_TABLET | Freq: Three times a day (TID) | ORAL | 0 refills | Status: DC
  Filled 2022-07-08: qty 30, 5d supply, fill #0

## 2022-07-08 MED ORDER — VANCOMYCIN HCL IN DEXTROSE 1-5 GM/200ML-% IV SOLN
1000.0000 mg | Freq: Once | INTRAVENOUS | Status: AC
  Administered 2022-07-08: 1000 mg via INTRAVENOUS
  Filled 2022-07-08: qty 200

## 2022-07-08 MED ORDER — DEXTROSE 5 % IV SOLN
1500.0000 mg | Freq: Once | INTRAVENOUS | Status: AC
  Administered 2022-07-08: 1500 mg via INTRAVENOUS
  Filled 2022-07-08: qty 75

## 2022-07-08 MED ORDER — MOUNJARO 5 MG/0.5ML ~~LOC~~ SOAJ
5.0000 mg | SUBCUTANEOUS | 3 refills | Status: DC
  Filled 2022-07-08: qty 2, 28d supply, fill #0
  Filled 2022-07-29: qty 2, 28d supply, fill #1
  Filled 2022-09-06: qty 2, 28d supply, fill #2
  Filled 2022-10-02: qty 2, 28d supply, fill #3

## 2022-07-08 NOTE — ED Triage Notes (Signed)
Pt has been using an antibiotic cream to left axillary, stopped Saturday due to concern for having a reaction to the cream. Redness  to area and has increased. Chills last night. Pt with hx hidradenitis.

## 2022-07-08 NOTE — Discharge Instructions (Signed)
Continue your Bactrim as directed.  Warm wet compresses, follow-up with your primary care provider for recheck or with general surgery as listed.  Return emergency department for any new or worsening symptoms.

## 2022-07-08 NOTE — ED Provider Notes (Signed)
[place] EMERGENCY DEPARTMENT AT [facility] Provider Note   CSN: [id number] Arrival date & time: 07/08/22  1316     History {Add pertinent medical, surgical, social history, OB history to HPI:1} Chief Complaint  Patient presents with   Abscess    MATA KILGOUR is a 37 y.o. female.   Abscess      KENLYNN DELGARDO is a 37 y.o. female who presents to the Emergency Department complaining of   Home Medications Prior to Admission medications   Medication Sig Start Date End Date Taking? Authorizing Provider  acetaminophen (TYLENOL) 500 MG tablet Take 1,000 mg by mouth every 6 (six) hours as needed (for pain/headaches.).     [provider]  albuterol (PROVENTIL) (2.5 MG/3ML) 0.083% nebulizer solution Inhale 3 mLs (2.5 mg total) by nebulization every 2 (two) hours as needed for wheezing or shortness of breath. 03/27/22   Joseph Art, DO  albuterol (VENTOLIN HFA) 108 (90 Base) MCG/ACT inhaler Inhale 2 puffs into the lungs every 4 (four) - 6 (six)  hours as needed for shortness of breath. 01/10/22     allopurinol (ZYLOPRIM) 300 MG tablet Take 300 mg by mouth daily.    [provider]  allopurinol (ZYLOPRIM) 300 MG tablet Take 1 tablet (300 mg total) by mouth daily. 06/30/22     allopurinol (ZYLOPRIM) 300 MG tablet Take 1 tablet (300 mg total) by mouth daily. 07/04/22     Azelastine-Fluticasone 137-50 MCG/ACT SUSP Place 1 spray into the nose in the morning and at bedtime. 04/15/22   Charlott Holler, MD  BIOTIN PO Take by mouth.    [provider]  Blood Glucose Monitoring Suppl (FREESTYLE LITE) w/Device KIT Use to test blood sugar 01/10/22     butalbital-acetaminophen-caffeine (FIORICET, ESGIC) 50-325-40 MG tablet Take 1 tablet by mouth daily as needed (headaches.). 01/21/18   [provider]  colchicine 0.6 MG tablet Take 2 tablets by mouth then take 1 tablet by mouth 6 hours later as directed. 06/30/22     Continuous Blood Gluc Sensor (DEXCOM G6  SENSOR) MISC Use as directed change every 10 days 06/27/21     Continuous Blood Gluc Transmit (DEXCOM G6 TRANSMITTER) MISC Use as directed every 90 days 12/19/20     Continuous Blood Gluc Transmit (DEXCOM G6 TRANSMITTER) MISC Use as directed with Dexcom sensor, replace every 90 days 02/06/21     Continuous Glucose Sensor (DEXCOM G7 SENSOR) MISC Use as directed for blood glucose monitoring, replace every 10 days. 12/06/21     dapagliflozin propanediol (FARXIGA) 5 MG TABS tablet Take 1 tablet (5 mg total) by mouth daily. 06/06/22     fexofenadine (ALLEGRA) 180 MG tablet Take 1 tablet(s) every day by oral route. 05/07/22     fluticasone-salmeterol (ADVAIR HFA) 115-21 MCG/ACT inhaler Inhale 2 puffs into the lungs 2 (two) times daily. 04/15/22   Charlott Holler, MD  Glucagon (GVOKE HYPOPEN 2-PACK) 1 MG/0.2ML SOAJ use under the skin for hypoglycemia as needed 01/31/22     glucose blood test strip Use 2-3 times daily to test blood sugar 01/10/22     hydrochlorothiazide (HYDRODIURIL) 12.5 MG tablet Take 1 tablet (12.5 mg total) by mouth daily for high blood pressure. 07/04/22     insulin degludec (TRESIBA FLEXTOUCH) 200 UNIT/ML FlexTouch Pen Inject 10 Units into the skin every evening. Patient taking differently: Inject 14 Units into the skin every evening. 03/27/22   Marlin Canary U, DO  insulin lispro (HUMALOG KWIKPEN) 100  UNIT/ML KwikPen Inject 14-20 Units + correctional insulin into the skin 3 (three) times daily with meals. Max daily dose of 100 units. Patient not taking: Reported on 03/26/2022 01/31/22     Insulin Pen Needle 32G X 4 MM MISC Use as directed to inject insulin 4 times a day 01/10/22     Lancets (ONETOUCH DELICA PLUS LANCET33G) MISC Use to test blood sugar 2-3 times daily 01/10/22     loratadine (CLARITIN) 10 MG tablet Take 1 tablet (10 mg total) by mouth daily. 04/15/22   Charlott Holler, MD  megestrol (MEGACE) 40 MG tablet Take 3 tablets (120 mg total) by mouth daily. 120 mg daily for 24 days then 4 days  off.  Repeat the cycle 06/29/22   Lazaro Arms, MD  metFORMIN (GLUCOPHAGE-XR) 500 MG 24 hr tablet Take 2 tablets (1,000 mg total) by mouth daily. 06/04/22     metFORMIN (GLUCOPHAGE-XR) 500 MG 24 hr tablet Take 2 tablets (1,000 mg total) by mouth daily. 06/06/22     metoCLOPramide (REGLAN) 10 MG tablet Take 1 tablet by mouth every 6 (six) hours. 08/02/21     pantoprazole (PROTONIX) 40 MG tablet Take 1 tablet (40 mg total) by mouth 2 (two) times daily. 07/04/22     potassium chloride (KLOR-CON) 10 MEQ tablet Take 1 tablet (10 mEq total) by mouth daily. 07/04/22     pravastatin (PRAVACHOL) 20 MG tablet Take 1 tablet (20 mg total) by mouth Nightly for cholesterol 07/04/22     Probiotic, Lactobacillus, CAPS  11/28/19   [provider]  tirzepatide Greggory Keen) 2.5 MG/0.5ML Pen Inject 2.5 mg into the skin once a week. 05/06/22     tirzepatide (MOUNJARO) 5 MG/0.5ML Pen Inject 5 mg into the skin once a week. 07/07/22     tranexamic acid (LYSTEDA) 650 MG TABS tablet Take 2 tablets (1,300 mg total) by mouth 3 (three) times daily. For 5 days 07/08/22   Lazaro Arms, MD  VITAMIN D PO Take by mouth.    [provider]      Allergies    Ace inhibitors, Beta adrenergic blockers, Augmentin [amoxicillin-pot clavulanate], Benicar [olmesartan], Doxycycline, Egg-derived products, Influenza vaccine live, Influenza virus vaccine, Invokana [canagliflozin], and Liraglutide    Review of Systems   Review of Systems  Physical Exam Updated Vital Signs BP (!) 141/78 (BP Location: Right Arm)   Pulse 94   Temp 98.7 F (37.1 C) (Oral)   Resp 16   Ht 5\' 8"  (1.727 m)   Wt 127.9 kg   SpO2 98%   BMI 42.88 kg/m  Physical Exam  ED Results / Procedures / Treatments   Labs (all labs ordered are listed, but only abnormal results are displayed) Labs Reviewed  CBC WITH DIFFERENTIAL/PLATELET - Abnormal; Notable for the following components:      Result Value   RBC 5.16 (*)    Hemoglobin 15.8 (*)    HCT 46.6  (*)    All other components within normal limits  BASIC METABOLIC PANEL - Abnormal; Notable for the following components:   Potassium 3.4 (*)    Glucose, Bld 164 (*)    All other components within normal limits    EKG None  Radiology  Ultrasound left axilla  Radiology results did not cross into epic.  Newly reviewed by me, no evidence of abscess or fluid collection seen on ultrasound  Procedures Procedures  {Document cardiac monitor, telemetry assessment procedure when appropriate:1}  Medications Ordered in ED Medications  dalbavancin (  DALVANCE) 1,500 mg in dextrose 5 % 500 mL IVPB (1,500 mg Intravenous New Bag/Given 07/08/22 1735)  vancomycin (VANCOCIN) IVPB 1000 mg/200 mL premix (0 mg Intravenous Stopped 07/08/22 1525)    ED Course/ Medical Decision Making/ A&P   {   Click here for ABCD2, HEART and other calculatorsREFRESH Note before signing :1}                          Medical Decision Making Amount and/or Complexity of Data Reviewed Labs: ordered. Radiology: ordered.  Risk Prescription drug management.     {Document critical care time when appropriate:1} {Document review of labs and clinical decision tools ie heart score, Chads2Vasc2 etc:1}  {Document your independent review of radiology images, and any outside records:1} {Document your discussion with family members, caretakers, and with consultants:1} {Document social determinants of health affecting pt's care:1} {Document your decision making why or why not admission, treatments were needed:1} Final Clinical Impression(s) / ED Diagnoses Final diagnoses:  None    Rx / DC Orders ED Discharge Orders     None

## 2022-07-08 NOTE — Addendum Note (Signed)
Addended by: Lazaro Arms on: 07/08/2022 11:45 AM   Modules accepted: Orders

## 2022-07-09 ENCOUNTER — Inpatient Hospital Stay (HOSPITAL_COMMUNITY)
Admission: EM | Admit: 2022-07-09 | Discharge: 2022-07-11 | DRG: 872 | Disposition: A | Discharge status: Home / Self Care | Payer: Commercial Managed Care - PPO | Attending: Internal Medicine | Admitting: Internal Medicine

## 2022-07-09 ENCOUNTER — Emergency Department (HOSPITAL_COMMUNITY): Payer: Commercial Managed Care - PPO

## 2022-07-09 ENCOUNTER — Inpatient Hospital Stay (HOSPITAL_COMMUNITY): Payer: Commercial Managed Care - PPO

## 2022-07-09 ENCOUNTER — Other Ambulatory Visit: Payer: Self-pay

## 2022-07-09 DIAGNOSIS — K219 Gastro-esophageal reflux disease without esophagitis: Secondary | ICD-10-CM | POA: Diagnosis not present

## 2022-07-09 DIAGNOSIS — Z87891 Personal history of nicotine dependence: Secondary | ICD-10-CM | POA: Diagnosis not present

## 2022-07-09 DIAGNOSIS — Z1152 Encounter for screening for COVID-19: Secondary | ICD-10-CM

## 2022-07-09 DIAGNOSIS — E785 Hyperlipidemia, unspecified: Secondary | ICD-10-CM | POA: Diagnosis not present

## 2022-07-09 DIAGNOSIS — A419 Sepsis, unspecified organism: Secondary | ICD-10-CM | POA: Diagnosis not present

## 2022-07-09 DIAGNOSIS — Z8249 Family history of ischemic heart disease and other diseases of the circulatory system: Secondary | ICD-10-CM

## 2022-07-09 DIAGNOSIS — L03112 Cellulitis of left axilla: Secondary | ICD-10-CM | POA: Diagnosis present

## 2022-07-09 DIAGNOSIS — K760 Fatty (change of) liver, not elsewhere classified: Secondary | ICD-10-CM | POA: Diagnosis present

## 2022-07-09 DIAGNOSIS — N921 Excessive and frequent menstruation with irregular cycle: Secondary | ICD-10-CM | POA: Diagnosis not present

## 2022-07-09 DIAGNOSIS — L732 Hidradenitis suppurativa: Secondary | ICD-10-CM | POA: Diagnosis present

## 2022-07-09 DIAGNOSIS — E1169 Type 2 diabetes mellitus with other specified complication: Secondary | ICD-10-CM | POA: Diagnosis present

## 2022-07-09 DIAGNOSIS — Z833 Family history of diabetes mellitus: Secondary | ICD-10-CM | POA: Diagnosis not present

## 2022-07-09 DIAGNOSIS — L0390 Cellulitis, unspecified: Secondary | ICD-10-CM | POA: Diagnosis not present

## 2022-07-09 DIAGNOSIS — N179 Acute kidney failure, unspecified: Secondary | ICD-10-CM

## 2022-07-09 DIAGNOSIS — E876 Hypokalemia: Secondary | ICD-10-CM | POA: Diagnosis present

## 2022-07-09 DIAGNOSIS — Z6841 Body Mass Index (BMI) 40.0 and over, adult: Secondary | ICD-10-CM | POA: Diagnosis not present

## 2022-07-09 DIAGNOSIS — E66813 Obesity, class 3: Secondary | ICD-10-CM | POA: Diagnosis present

## 2022-07-09 DIAGNOSIS — E860 Dehydration: Secondary | ICD-10-CM | POA: Diagnosis not present

## 2022-07-09 DIAGNOSIS — R112 Nausea with vomiting, unspecified: Secondary | ICD-10-CM | POA: Diagnosis not present

## 2022-07-09 DIAGNOSIS — Z794 Long term (current) use of insulin: Secondary | ICD-10-CM

## 2022-07-09 DIAGNOSIS — E871 Hypo-osmolality and hyponatremia: Secondary | ICD-10-CM | POA: Diagnosis not present

## 2022-07-09 DIAGNOSIS — Z888 Allergy status to other drugs, medicaments and biological substances status: Secondary | ICD-10-CM

## 2022-07-09 DIAGNOSIS — Z8619 Personal history of other infectious and parasitic diseases: Secondary | ICD-10-CM

## 2022-07-09 DIAGNOSIS — I10 Essential (primary) hypertension: Secondary | ICD-10-CM

## 2022-07-09 DIAGNOSIS — J45909 Unspecified asthma, uncomplicated: Secondary | ICD-10-CM

## 2022-07-09 DIAGNOSIS — E872 Acidosis, unspecified: Secondary | ICD-10-CM | POA: Diagnosis present

## 2022-07-09 DIAGNOSIS — E282 Polycystic ovarian syndrome: Secondary | ICD-10-CM | POA: Diagnosis present

## 2022-07-09 DIAGNOSIS — N920 Excessive and frequent menstruation with regular cycle: Secondary | ICD-10-CM | POA: Diagnosis not present

## 2022-07-09 DIAGNOSIS — N6019 Diffuse cystic mastopathy of unspecified breast: Secondary | ICD-10-CM | POA: Diagnosis present

## 2022-07-09 DIAGNOSIS — Z825 Family history of asthma and other chronic lower respiratory diseases: Secondary | ICD-10-CM

## 2022-07-09 DIAGNOSIS — Z881 Allergy status to other antibiotic agents status: Secondary | ICD-10-CM

## 2022-07-09 DIAGNOSIS — Z7985 Long-term (current) use of injectable non-insulin antidiabetic drugs: Secondary | ICD-10-CM

## 2022-07-09 DIAGNOSIS — I119 Hypertensive heart disease without heart failure: Secondary | ICD-10-CM | POA: Diagnosis not present

## 2022-07-09 DIAGNOSIS — Z0389 Encounter for observation for other suspected diseases and conditions ruled out: Secondary | ICD-10-CM | POA: Diagnosis not present

## 2022-07-09 DIAGNOSIS — R21 Rash and other nonspecific skin eruption: Secondary | ICD-10-CM | POA: Diagnosis present

## 2022-07-09 DIAGNOSIS — Z9049 Acquired absence of other specified parts of digestive tract: Secondary | ICD-10-CM

## 2022-07-09 DIAGNOSIS — Z7984 Long term (current) use of oral hypoglycemic drugs: Secondary | ICD-10-CM

## 2022-07-09 DIAGNOSIS — N839 Noninflammatory disorder of ovary, fallopian tube and broad ligament, unspecified: Secondary | ICD-10-CM | POA: Diagnosis not present

## 2022-07-09 DIAGNOSIS — R197 Diarrhea, unspecified: Secondary | ICD-10-CM | POA: Diagnosis not present

## 2022-07-09 DIAGNOSIS — E782 Mixed hyperlipidemia: Secondary | ICD-10-CM | POA: Diagnosis not present

## 2022-07-09 DIAGNOSIS — Z7951 Long term (current) use of inhaled steroids: Secondary | ICD-10-CM

## 2022-07-09 DIAGNOSIS — R6520 Severe sepsis without septic shock: Secondary | ICD-10-CM | POA: Diagnosis present

## 2022-07-09 DIAGNOSIS — E1165 Type 2 diabetes mellitus with hyperglycemia: Secondary | ICD-10-CM | POA: Diagnosis present

## 2022-07-09 DIAGNOSIS — Z91012 Allergy to eggs: Secondary | ICD-10-CM

## 2022-07-09 DIAGNOSIS — Z87442 Personal history of urinary calculi: Secondary | ICD-10-CM

## 2022-07-09 DIAGNOSIS — Z887 Allergy status to serum and vaccine status: Secondary | ICD-10-CM

## 2022-07-09 DIAGNOSIS — R000 Tachycardia, unspecified: Secondary | ICD-10-CM | POA: Diagnosis not present

## 2022-07-09 DIAGNOSIS — G4730 Sleep apnea, unspecified: Secondary | ICD-10-CM | POA: Diagnosis present

## 2022-07-09 DIAGNOSIS — Z880 Allergy status to penicillin: Secondary | ICD-10-CM

## 2022-07-09 DIAGNOSIS — Z79899 Other long term (current) drug therapy: Secondary | ICD-10-CM

## 2022-07-09 LAB — COMPREHENSIVE METABOLIC PANEL
ALT: 93 U/L — ABNORMAL HIGH (ref 0–44)
AST: 74 U/L — ABNORMAL HIGH (ref 15–41)
Albumin: 3.9 g/dL (ref 3.5–5.0)
Alkaline Phosphatase: 60 U/L (ref 38–126)
Anion gap: 13 (ref 5–15)
BUN: 10 mg/dL (ref 6–20)
CO2: 20 mmol/L — ABNORMAL LOW (ref 22–32)
Calcium: 9 mg/dL (ref 8.9–10.3)
Chloride: 100 mmol/L (ref 98–111)
Creatinine, Ser: 1.29 mg/dL — ABNORMAL HIGH (ref 0.44–1.00)
GFR, Estimated: 54 mL/min — ABNORMAL LOW (ref >60)
Glucose, Bld: 332 mg/dL — ABNORMAL HIGH (ref 70–99)
Potassium: 3.9 mmol/L (ref 3.5–5.1)
Sodium: 133 mmol/L — ABNORMAL LOW (ref 135–145)
Total Bilirubin: 1.9 mg/dL — ABNORMAL HIGH (ref 0.3–1.2)
Total Protein: 7.7 g/dL (ref 6.5–8.1)

## 2022-07-09 LAB — PROTIME-INR
INR: 1.1 (ref 0.8–1.2)
Prothrombin Time: 14.7 seconds (ref 11.4–15.2)

## 2022-07-09 LAB — CBC WITH DIFFERENTIAL/PLATELET
Abs Immature Granulocytes: 0.14 10*3/uL — ABNORMAL HIGH (ref 0.00–0.07)
Basophils Absolute: 0.1 10*3/uL (ref 0.0–0.1)
Basophils Relative: 1 %
Eosinophils Absolute: 0.1 10*3/uL (ref 0.0–0.5)
Eosinophils Relative: 0 %
HCT: 49.8 % — ABNORMAL HIGH (ref 36.0–46.0)
Hemoglobin: 16.9 g/dL — ABNORMAL HIGH (ref 12.0–15.0)
Immature Granulocytes: 1 %
Lymphocytes Relative: 1 %
Lymphs Abs: 0.3 10*3/uL — ABNORMAL LOW (ref 0.7–4.0)
MCH: 31.2 pg (ref 26.0–34.0)
MCHC: 33.9 g/dL (ref 30.0–36.0)
MCV: 92.1 fL (ref 80.0–100.0)
Monocytes Absolute: 0.9 10*3/uL (ref 0.1–1.0)
Monocytes Relative: 5 %
Neutro Abs: 18.5 10*3/uL — ABNORMAL HIGH (ref 1.7–7.7)
Neutrophils Relative %: 92 %
Platelets: 266 10*3/uL (ref 150–400)
RBC: 5.41 MIL/uL — ABNORMAL HIGH (ref 3.87–5.11)
RDW: 13.1 % (ref 11.5–15.5)
WBC: 20.1 10*3/uL — ABNORMAL HIGH (ref 4.0–10.5)
nRBC: 0 % (ref 0.0–0.2)

## 2022-07-09 LAB — RESP PANEL BY RT-PCR (RSV, FLU A&B, COVID)  RVPGX2
Influenza A by PCR: NEGATIVE
Influenza B by PCR: NEGATIVE
Resp Syncytial Virus by PCR: NEGATIVE
SARS Coronavirus 2 by RT PCR: NEGATIVE

## 2022-07-09 LAB — MRSA NEXT GEN BY PCR, NASAL: MRSA by PCR Next Gen: NOT DETECTED

## 2022-07-09 LAB — GLUCOSE, CAPILLARY
Glucose-Capillary: 259 mg/dL — ABNORMAL HIGH (ref 70–99)
Glucose-Capillary: 182 mg/dL — ABNORMAL HIGH (ref 70–99)
Glucose-Capillary: 175 mg/dL — ABNORMAL HIGH (ref 70–99)
Glucose-Capillary: 173 mg/dL — ABNORMAL HIGH (ref 70–99)

## 2022-07-09 LAB — CULTURE, BLOOD (ROUTINE X 2)

## 2022-07-09 LAB — APTT: aPTT: 27 seconds (ref 24–36)

## 2022-07-09 LAB — LACTIC ACID, PLASMA
Lactic Acid, Venous: 3.2 mmol/L (ref 0.5–1.9)
Lactic Acid, Venous: 3.3 mmol/L (ref 0.5–1.9)
Lactic Acid, Venous: 2.8 mmol/L (ref 0.5–1.9)

## 2022-07-09 MED ORDER — FAMOTIDINE IN NACL 20-0.9 MG/50ML-% IV SOLN
20.0000 mg | Freq: Once | INTRAVENOUS | Status: AC
  Administered 2022-07-09: 20 mg via INTRAVENOUS
  Filled 2022-07-09: qty 50

## 2022-07-09 MED ORDER — SODIUM CHLORIDE 0.9 % IV BOLUS
1000.0000 mL | Freq: Once | INTRAVENOUS | Status: AC
  Administered 2022-07-09: 1000 mL via INTRAVENOUS

## 2022-07-09 MED ORDER — ONDANSETRON HCL 4 MG PO TABS: 4.0000 mg | ORAL_TABLET | Freq: Four times a day (QID) | ORAL | Status: DC | PRN

## 2022-07-09 MED ORDER — SODIUM CHLORIDE 0.9 % IV SOLN: INTRAVENOUS | Status: AC

## 2022-07-09 MED ORDER — LORAZEPAM 2 MG/ML IJ SOLN
1.0000 mg | Freq: Once | INTRAMUSCULAR | Status: AC
  Administered 2022-07-09: 1 mg via INTRAVENOUS
  Filled 2022-07-09: qty 1

## 2022-07-09 MED ORDER — METOCLOPRAMIDE HCL 10 MG PO TABS: 10.0000 mg | ORAL_TABLET | Freq: Three times a day (TID) | ORAL | Status: DC | PRN

## 2022-07-09 MED ORDER — PIPERACILLIN-TAZOBACTAM 3.375 G IVPB 30 MIN
3.3750 g | Freq: Once | INTRAVENOUS | Status: AC
  Administered 2022-07-09: 3.375 g via INTRAVENOUS
  Filled 2022-07-09: qty 50

## 2022-07-09 MED ORDER — INSULIN ASPART 100 UNIT/ML IJ SOLN
0.0000 [IU] | Freq: Every day | INTRAMUSCULAR | Status: DC
  Administered 2022-07-10: 2 [IU] via SUBCUTANEOUS

## 2022-07-09 MED ORDER — MOMETASONE FURO-FORMOTEROL FUM 200-5 MCG/ACT IN AERO
2.0000 | INHALATION_SPRAY | Freq: Two times a day (BID) | RESPIRATORY_TRACT | Status: DC
  Administered 2022-07-09 – 2022-07-11 (×5): 2 via RESPIRATORY_TRACT
  Filled 2022-07-09: qty 8.8

## 2022-07-09 MED ORDER — CHLORHEXIDINE GLUCONATE CLOTH 2 % EX PADS
6.0000 | MEDICATED_PAD | Freq: Every day | CUTANEOUS | Status: DC
  Administered 2022-07-09 – 2022-07-11 (×3): 6 via TOPICAL

## 2022-07-09 MED ORDER — HYDROMORPHONE HCL 1 MG/ML IJ SOLN
1.0000 mg | Freq: Once | INTRAMUSCULAR | Status: AC
  Administered 2022-07-09: 1 mg via INTRAVENOUS
  Filled 2022-07-09: qty 1

## 2022-07-09 MED ORDER — HYDROCHLOROTHIAZIDE 12.5 MG PO TABS: 12.5000 mg | ORAL_TABLET | Freq: Every day | ORAL | Status: DC

## 2022-07-09 MED ORDER — ALBUTEROL SULFATE (2.5 MG/3ML) 0.083% IN NEBU: 2.5000 mg | INHALATION_SOLUTION | RESPIRATORY_TRACT | Status: DC | PRN

## 2022-07-09 MED ORDER — ONDANSETRON HCL 4 MG/2ML IJ SOLN
4.0000 mg | Freq: Four times a day (QID) | INTRAMUSCULAR | Status: DC | PRN
  Administered 2022-07-09: 4 mg via INTRAVENOUS
  Filled 2022-07-09: qty 2

## 2022-07-09 MED ORDER — OXYCODONE HCL 5 MG PO TABS: 5.0000 mg | ORAL_TABLET | ORAL | Status: DC | PRN

## 2022-07-09 MED ORDER — ACETAMINOPHEN 650 MG RE SUPP: 650.0000 mg | Freq: Four times a day (QID) | RECTAL | Status: DC | PRN

## 2022-07-09 MED ORDER — PANTOPRAZOLE SODIUM 40 MG PO TBEC
40.0000 mg | DELAYED_RELEASE_TABLET | Freq: Two times a day (BID) | ORAL | Status: DC
  Administered 2022-07-09 – 2022-07-11 (×5): 40 mg via ORAL
  Filled 2022-07-09 (×5): qty 1

## 2022-07-09 MED ORDER — MEGESTROL ACETATE 40 MG PO TABS
120.0000 mg | ORAL_TABLET | Freq: Every day | ORAL | Status: DC
  Administered 2022-07-09: 120 mg via ORAL
  Filled 2022-07-09 (×3): qty 3

## 2022-07-09 MED ORDER — MORPHINE SULFATE (PF) 2 MG/ML IV SOLN
2.0000 mg | INTRAVENOUS | Status: DC | PRN
  Administered 2022-07-09: 2 mg via INTRAVENOUS
  Filled 2022-07-09: qty 1

## 2022-07-09 MED ORDER — ONDANSETRON HCL 4 MG/2ML IJ SOLN
4.0000 mg | Freq: Once | INTRAMUSCULAR | Status: AC
  Administered 2022-07-09: 4 mg via INTRAVENOUS
  Filled 2022-07-09: qty 2

## 2022-07-09 MED ORDER — INSULIN GLARGINE-YFGN 100 UNIT/ML ~~LOC~~ SOLN
10.0000 [IU] | Freq: Every day | SUBCUTANEOUS | Status: DC
  Administered 2022-07-09 – 2022-07-10 (×2): 10 [IU] via SUBCUTANEOUS
  Filled 2022-07-09 (×4): qty 0.1

## 2022-07-09 MED ORDER — ACETAMINOPHEN 325 MG PO TABS: 650.0000 mg | ORAL_TABLET | Freq: Four times a day (QID) | ORAL | Status: DC | PRN

## 2022-07-09 MED ORDER — SODIUM CHLORIDE 0.9 % IV BOLUS
500.0000 mL | Freq: Once | INTRAVENOUS | Status: AC
  Administered 2022-07-09: 500 mL via INTRAVENOUS

## 2022-07-09 MED ORDER — INSULIN ASPART 100 UNIT/ML IJ SOLN
0.0000 [IU] | Freq: Three times a day (TID) | INTRAMUSCULAR | Status: DC
  Administered 2022-07-09: 8 [IU] via SUBCUTANEOUS
  Administered 2022-07-09 (×2): 3 [IU] via SUBCUTANEOUS
  Administered 2022-07-10 (×2): 5 [IU] via SUBCUTANEOUS
  Administered 2022-07-10: 3 [IU] via SUBCUTANEOUS
  Administered 2022-07-11: 5 [IU] via SUBCUTANEOUS

## 2022-07-09 MED ORDER — HEPARIN SODIUM (PORCINE) 5000 UNIT/ML IJ SOLN
5000.0000 [IU] | Freq: Three times a day (TID) | INTRAMUSCULAR | Status: DC
  Administered 2022-07-09 – 2022-07-10 (×4): 5000 [IU] via SUBCUTANEOUS
  Filled 2022-07-09 (×3): qty 1

## 2022-07-09 MED ORDER — PIPERACILLIN-TAZOBACTAM 3.375 G IVPB
3.3750 g | Freq: Three times a day (TID) | INTRAVENOUS | Status: DC
  Administered 2022-07-09 – 2022-07-11 (×7): 3.375 g via INTRAVENOUS
  Filled 2022-07-09 (×7): qty 50

## 2022-07-09 MED ORDER — PRAVASTATIN SODIUM 10 MG PO TABS
20.0000 mg | ORAL_TABLET | Freq: Every evening | ORAL | Status: DC
  Administered 2022-07-09: 20 mg via ORAL
  Filled 2022-07-09: qty 2

## 2022-07-09 NOTE — Inpatient Diabetes Management (Signed)
Inpatient Diabetes Program Recommendations  AACE/ADA: New Consensus Statement on Inpatient Glycemic Control   Target Ranges:  Prepandial:   less than 140 mg/dL      Peak postprandial:   less than 180 mg/dL (1-2 hours)      Critically ill patients:  140 - 180 mg/dL    Latest Reference Range & Units 07/09/22 07:20  Glucose-Capillary 70 - 99 mg/dL 161 (H)    Latest Reference Range & Units 07/09/22 02:31  Glucose 70 - 99 mg/dL 096 (H)   Review of Glycemic Control  Diabetes history: DM2 Outpatient Diabetes medications: Farxiga 5 mg daily, Metformin XR 1000 mg daily, Mounjaro 5 mg Qweek Current orders for Inpatient glycemic control: Novolog 0-15 units TID with meals, Novolog 0-5 units QHS  Inpatient Diabetes Program Recommendations:    Insulin: Please consider ordering Semglee 15 units Q24H to start this morning.  NOTE: Patient admitted with sepsis, cellulitis, dehydration, and AKI. Patient has hx of DM2 and per H&P, "She is no longer taking the [place] that is on the PTA medication list. She is on [place] as well as metformin."  In reviewing chart, noted office note by Dennie Maizes, FNP on 06/06/22 which notes patient to continue Mounjaro and Metformin and restart [place]. CBG 259 mg/dl this morning.   Thanks, Orlando Penner, RN, MSN, CDCES Diabetes Coordinator Inpatient Diabetes Program [phone] (Team Pager from 8am to 5pm)

## 2022-07-09 NOTE — Assessment & Plan Note (Deleted)
Hyponatremia, hypokalemia.   Patient had successful volume resuscitation yesterday. Today blood pressure systolic is 100 to 105 mmHg.   Follow up renal function with serum cr at 0,68, K is 3,2 and serum bicarbonate at 22. Na 139, CL 112 and Mg 2.0   Plan to add 40 meq Kcl x 2 doses. Hold on further IV fluids.  Follow up renal function and electrolytes in am, avoid hypotension and nephrotoxic medications.

## 2022-07-09 NOTE — Assessment & Plan Note (Deleted)
-   Lactic acidosis 3.2, AKI - Likely related to sepsis pathology - 2 L given in the ED - Continue 150 mill per hour - Continue to monitor

## 2022-07-09 NOTE — Assessment & Plan Note (Signed)
Calculated BMI 42,9

## 2022-07-09 NOTE — Assessment & Plan Note (Addendum)
--  Continue Protonix and Pepcid 

## 2022-07-09 NOTE — H&P (Signed)
Transition of Care [facility]) - Inpatient Brief Assessment   Patient Details  Name: Cheryl Black MRN: [id number] Date of Birth: 06/04/1985  [facility]) CM/SW Contact:    Annice Needy, LCSW Phone Number: 07/09/2022, 3:09 PM   Clinical Narrative: Transition of Care Department [facility]) has reviewed patient and no TOC needs have been identified at this time. We will continue to monitor patient advancement through interdisciplinary progression rounds. If new patient transition needs arise, please place a TOC consult.   Transition of Care Asessment: Insurance and Status: Insurance coverage has been reviewed Patient has primary care physician: Yes Home environment has been reviewed: yes Prior level of function:: indep Prior/Current Home Services: No current home services Social Determinants of Health Reivew: SDOH reviewed no interventions necessary Readmission risk has been reviewed: Yes Transition of care needs: no transition of care needs at this time

## 2022-07-09 NOTE — Sepsis Progress Note (Signed)
Elink monitoring for the code sepsis protocol.  

## 2022-07-09 NOTE — H&P (Signed)
History and Physical    Patient: Cheryl Black ZOX:[id number] DOB: [birth year] DOA: 07/09/2022 DOS: the patient was seen and examined on 07/09/2022 PCP: Cheryl Stabile, MD  Patient coming from: Home  Chief Complaint:  Chief Complaint  Patient presents with   Emesis   Diarrhea   HPI: Cheryl Black is a 37 y.o. female with medical history significant of GERD, asthma, diabetes mellitus type 2, hidradenitis, hyperlipidemia, PCOS, and more presents the ED with a chief complaint of nausea vomiting and diarrhea.  Patient reports she has been battling cellulitis in her left axilla.  She went to the dermatologist on June 21.  She went there for a left axilla pain.  She describes the pain as a burning pain.  It has been getting progressively worse since it started.  She has erythema in the axilla as well.  They put her on clindamycin lotion and Bactrim DS.  She did that, but then on Saturday when she woke up her armpit over sure it was soaked through.  She thought she had an abscess that ruptured.  She went back to the dermatologist and they told her to go to the ER.  Patient reports she was given Dalvance in the ER.  She thinks she was given vancomycin as well.  Halfway home she stopped to get a burger and upon eating it started having uncontrollable nausea, vomiting, diarrhea.  Patient reports she had 20-25 episodes of nonbloody emesis.  She had 20-25 episodes of nonbloody diarrhea as well.  Patient believes the diarrhea is nonbloody but is hard to tell if she has been having menstrual bleeding since April.  Patient reports dizziness recently.  She has had no chest pain, shortness of breath, paresthesias.  Dizziness could be related to blood loss versus dehydration.  Patient reports that her blood sugars do not normally run as high as they are today.  They usually run 160s.  She is no longer taking the [place] that is on the PTA medication list.  She is on [place] as well as metformin.  Patient only  other complaint is pelvic pressure.  It is better when she passes blood clots.  She reports her vaginal bleeding has been addressed by gynecologist with 3 separate rounds of Megace.  It has not helped her bleeding.  Since being in the ER she has developed some erythema over her right elbow as well.  Patient reports that it is not nearly as painful.  Patient does not smoke, does not drink, does not use illicit drugs.  She is vaccinated for COVID.  Patient is full code. Review of Systems: As mentioned in the history of present illness. All other systems reviewed and are negative. Past Medical History:  Diagnosis Date   Acid reflux    Asthma    Clostridium difficile infection    in the past   Diabetes mellitus    Fatty liver    Fibrocystic breast disease    Gout    Hidradenitis    History of kidney stones    Hyperlipemia    Obesity    Polycystic ovary syndrome    PONV (postoperative nausea and vomiting)    Respiratory failure (HCC)    "2018 and 2019"   Sleep apnea    Smoker    Past Surgical History:  Procedure Laterality Date   CHOLECYSTECTOMY  01/10/2011   Procedure: LAPAROSCOPIC CHOLECYSTECTOMY;  Surgeon: Dalia Heading;  Location: [facility];  Service: General;  Laterality: N/A;  ESOPHAGOGASTRODUODENOSCOPY (EGD) WITH PROPOFOL N/A 09/09/2018   RMR: Mild erosive reflux esophagitis.  Esophagus dilated for history of dysphagia.   HYDRADENITIS EXCISION Right 06/18/2012   Procedure: EXCISION HYDRIADENITIS SUPRATIVA  RIGHT AXILLA;  Surgeon: Marlane Hatcher, MD;  Location: [facility];  Service: General;  Laterality: Right;   LIVER BIOPSY  01/10/2011   Procedure: LIVER BIOPSY;  Surgeon: Dalia Heading;  Location: [facility];  Service: General;;   MALONEY DILATION N/A 09/09/2018   Procedure: Elease Hashimoto DILATION;  Surgeon: Corbin Ade, MD;  Location: [facility];  Service: Endoscopy;  Laterality: N/A;   SKIN SPLIT GRAFT Right 06/18/2012   Procedure: SKIN GRAFT SPLIT THICKNESS RIGHT AXILLA(WITH TWO  STANDARD SKIN BOARDS 3"x 8" )  DONOR SITE RIGHT AND LEFT THIGHS;  Surgeon: Marlane Hatcher, MD;  Location: [facility];  Service: General;  Laterality: Right;   Social History:  reports that she quit smoking about 4 years ago. Her smoking use included cigarettes. She has a 10.00 pack-year smoking history. She has been exposed to tobacco smoke. She has never used smokeless tobacco. She reports that she does not currently use alcohol. She reports that she does not use drugs.  Allergies  Allergen Reactions   Ace Inhibitors Anaphylaxis   Beta Adrenergic Blockers Other (See Comments)    Angioedema  Other reaction(s): Angioedema   Augmentin [Amoxicillin-Pot Clavulanate] Other (See Comments)    Has had C.diff in the past Did it involve swelling of the face/tongue/throat, SOB, or low BP? No Did it involve sudden or severe rash/hives, skin peeling, or any reaction on the inside of your mouth or nose? No Did you need to seek medical attention at a hospital or doctor's office? No When did it last happen?Unknown If all above answers are "NO", may proceed with cephalosporin use.    Benicar [Olmesartan] Other (See Comments)    Facial edema   Doxycycline Other (See Comments)    Makes feet and hands red   Egg-Derived Products     Local reaction with injectable meds that contain eggs   Influenza Vaccine Live Swelling    Arm    Influenza Virus Vaccine Hives   Invokana [Canagliflozin] Other (See Comments)    Dehydration    Liraglutide Nausea And Vomiting    Family History  Problem Relation Age of Onset   Other Mother        pre-cancerous cells; had hyst   Emphysema Mother        smoker   COPD Mother    Diabetes Father    Hypertension Father    Cancer Sister        cervical; had hyst   Gout Brother    Hypertension Maternal Grandmother    Heart attack Maternal Grandfather    Cancer Maternal Grandfather        adenocarcinoma   Emphysema Maternal Grandfather        smoked   Hypertension  Paternal Grandmother    Diabetes Paternal Grandmother    Gout Paternal Grandmother    Asthma Maternal Uncle    Stroke Other        paternal great grandfather   Colon cancer Neg Hx    Liver disease Neg Hx    Liver cancer Neg Hx     Prior to Admission medications   Medication Sig Start Date End Date Taking? Authorizing Provider  acetaminophen (TYLENOL) 500 MG tablet Take 1,000 mg by mouth every 6 (six) hours as needed (for pain/headaches.).  [provider]  albuterol (PROVENTIL) (2.5 MG/3ML) 0.083% nebulizer solution Inhale 3 mLs (2.5 mg total) by nebulization every 2 (two) hours as needed for wheezing or shortness of breath. 03/27/22   Joseph Art, DO  albuterol (VENTOLIN HFA) 108 (90 Base) MCG/ACT inhaler Inhale 2 puffs into the lungs every 4 (four) - 6 (six)  hours as needed for shortness of breath. 01/10/22     allopurinol (ZYLOPRIM) 300 MG tablet Take 300 mg by mouth daily.    [provider]  allopurinol (ZYLOPRIM) 300 MG tablet Take 1 tablet (300 mg total) by mouth daily. 06/30/22     allopurinol (ZYLOPRIM) 300 MG tablet Take 1 tablet (300 mg total) by mouth daily. 07/04/22     Azelastine-Fluticasone 137-50 MCG/ACT SUSP Place 1 spray into the nose in the morning and at bedtime. 04/15/22   Charlott Holler, MD  BIOTIN PO Take by mouth.    [provider]  Blood Glucose Monitoring Suppl (FREESTYLE LITE) w/Device KIT Use to test blood sugar 01/10/22     butalbital-acetaminophen-caffeine (FIORICET, ESGIC) 50-325-40 MG tablet Take 1 tablet by mouth daily as needed (headaches.). 01/21/18   [provider]  colchicine 0.6 MG tablet Take 2 tablets by mouth then take 1 tablet by mouth 6 hours later as directed. 06/30/22     Continuous Blood Gluc Sensor (DEXCOM G6 SENSOR) MISC Use as directed change every 10 days 06/27/21     Continuous Blood Gluc Transmit (DEXCOM G6 TRANSMITTER) MISC Use as directed every 90 days 12/19/20     Continuous Blood Gluc Transmit (DEXCOM  G6 TRANSMITTER) MISC Use as directed with Dexcom sensor, replace every 90 days 02/06/21     Continuous Glucose Sensor (DEXCOM G7 SENSOR) MISC Use as directed for blood glucose monitoring, replace every 10 days. 12/06/21     dapagliflozin propanediol (FARXIGA) 5 MG TABS tablet Take 1 tablet (5 mg total) by mouth daily. 06/06/22     fexofenadine (ALLEGRA) 180 MG tablet Take 1 tablet(s) every day by oral route. 05/07/22     fluticasone-salmeterol (ADVAIR HFA) 115-21 MCG/ACT inhaler Inhale 2 puffs into the lungs 2 (two) times daily. 04/15/22   Charlott Holler, MD  Glucagon (GVOKE HYPOPEN 2-PACK) 1 MG/0.2ML SOAJ use under the skin for hypoglycemia as needed 01/31/22     glucose blood test strip Use 2-3 times daily to test blood sugar 01/10/22     hydrochlorothiazide (HYDRODIURIL) 12.5 MG tablet Take 1 tablet (12.5 mg total) by mouth daily for high blood pressure. 07/04/22     insulin degludec (TRESIBA FLEXTOUCH) 200 UNIT/ML FlexTouch Pen Inject 10 Units into the skin every evening. Patient taking differently: Inject 14 Units into the skin every evening. 03/27/22   Joseph Art, DO  insulin lispro (HUMALOG KWIKPEN) 100 UNIT/ML KwikPen Inject 14-20 Units + correctional insulin into the skin 3 (three) times daily with meals. Max daily dose of 100 units. Patient not taking: Reported on 03/26/2022 01/31/22     Insulin Pen Needle 32G X 4 MM MISC Use as directed to inject insulin 4 times a day 01/10/22     Lancets (ONETOUCH DELICA PLUS LANCET33G) MISC Use to test blood sugar 2-3 times daily 01/10/22     loratadine (CLARITIN) 10 MG tablet Take 1 tablet (10 mg total) by mouth daily. 04/15/22   Charlott Holler, MD  megestrol (MEGACE) 40 MG tablet Take 3 tablets (120 mg total) by mouth daily. 120 mg daily for 24 days then 4 days off.  Repeat the cycle 06/29/22   Lazaro Arms, MD  metFORMIN (GLUCOPHAGE-XR) 500 MG 24 hr tablet Take 2 tablets (1,000 mg total) by mouth daily. 06/04/22     metFORMIN (GLUCOPHAGE-XR) 500 MG 24 hr tablet  Take 2 tablets (1,000 mg total) by mouth daily. 06/06/22     metoCLOPramide (REGLAN) 10 MG tablet Take 1 tablet by mouth every 6 (six) hours. 08/02/21     pantoprazole (PROTONIX) 40 MG tablet Take 1 tablet (40 mg total) by mouth 2 (two) times daily. 07/04/22     potassium chloride (KLOR-CON) 10 MEQ tablet Take 1 tablet (10 mEq total) by mouth daily. 07/04/22     pravastatin (PRAVACHOL) 20 MG tablet Take 1 tablet (20 mg total) by mouth Nightly for cholesterol 07/04/22     Probiotic, Lactobacillus, CAPS  11/28/19   [provider]  tirzepatide Greggory Keen) 2.5 MG/0.5ML Pen Inject 2.5 mg into the skin once a week. 05/06/22     tirzepatide (MOUNJARO) 5 MG/0.5ML Pen Inject 5 mg into the skin once a week. 07/07/22     tranexamic acid (LYSTEDA) 650 MG TABS tablet Take 2 tablets (1,300 mg total) by mouth 3 (three) times daily. For 5 days 07/08/22   Lazaro Arms, MD  VITAMIN D PO Take by mouth.    [provider]    Physical Exam: Vitals:   07/09/22 0509 07/09/22 0553 07/09/22 0555 07/09/22 0600  BP:   135/78 135/87  Pulse:  98 97 (!) 104  Resp:  18 18 20   Temp: 97.8 F (36.6 C) 98.2 F (36.8 C)    TempSrc: Oral Oral    SpO2:  97% 95% 98%  Weight:  128.1 kg    Height:  5\' 8"  (1.727 m)     1.  General: Patient lying supine in bed,  no acute distress   2. Psychiatric: Alert and oriented x 3, mood and behavior normal for situation, pleasant and cooperative with exam   3. Neurologic: Speech and language are normal, face is symmetric, moves all 4 extremities voluntarily, at baseline without acute deficits on limited exam   4. HEENMT:  Head is atraumatic, normocephalic, pupils reactive to light, neck is supple, trachea is midline, mucous membranes are moist   5. Respiratory : Lungs are clear to auscultation bilaterally without wheezing, rhonchi, rales, no cyanosis, no increase in work of breathing or accessory muscle use   6. Cardiovascular : Heart rate tachycardic, rhythm is  regular, no murmurs, rubs or gallops, no peripheral edema, peripheral pulses palpated   7. Gastrointestinal:  Abdomen is soft, nondistended, nontender to palpation bowel sounds active, no masses or organomegaly palpated   8. Skin:  Left axilla is erythematous, moist, edematous, with some hyperpigmentation, no purulent drainage, tender to palpation   9.Musculoskeletal:  No acute deformities or trauma, no asymmetry in tone, no peripheral edema, peripheral pulses palpated, no tenderness to palpation in the extremities  Data Reviewed: In the ED Temp 98.2-98.7, heart rate 86-133, respiratory rate 14-27, blood pressure 98/67-141/99, satting 96-99% on room air Leukocytosis at 20.1, hemoglobin 16.9 Chemistry reveals an AKI with a creatinine of 1.29 and a hyperglycemia at 332 Lactic acid has been elevated x 2, third is pending Patient had negative COVID and flu Blood cultures pending CT chest shows negative for abscess Chest x-ray shows no active disease EKG is shows a heart rate of 128, sinus tachycardia, QTc 440 Imaging done earlier today showed a ultrasound of the left axilla with no appreciable fluid collection  or abscess Pharmacy was consulted and recommended Zosyn given the patient already had Dalvance Admission requested for cellulitis  Assessment and Plan: * Sepsis due to cellulitis (HCC) - With the tachycardia up to 133, tachypnea of 27, leukocytosis at 20.1, AKI, and lactic acidosis - Cellulitis of left axilla - CT confirms no abscess - Patient was recently given Dalvance, so upon pharmacy consult Zosyn was recommended - Zosyn started in the ED - Continue Zosyn - Consider ID consult today? - Blood cultures pending - UA pending - Continue to monitor  Essential hypertension - Holding hydrochlorothiazide in the setting of AKI  Asthma, chronic - Continue Dulera - Continue as needed albuterol  Dehydration - Lactic acidosis 3.2, AKI - Likely related to sepsis pathology - 2  L given in the ED - Continue 150 mill per hour - Continue to monitor  AKI (acute kidney injury) (HCC) - AKI with a creatinine increase to 1.29 from 0.8 - Likely related to dehydration and sepsis pathology - Continue IV hydration - Trend in the a.m.  Menometrorrhagia - Bleeding since April - Heavy bleeding with blood clots reported - Several rounds of Megace has not improved the bleeding - Hemoglobin is stable at 16.9 - Pelvic ultrasound - Monitor CBC in the a.m.  Hyperlipidemia - Continue pravastatin  GERD (gastroesophageal reflux disease) - Continue Protonix and Pepcid  Uncontrolled type 2 diabetes mellitus with hyperglycemia (HCC) - Glucose 332 - Patient is taking Mounjaro and oral hypoglycemics - Holding PTA medications - Monitor CBGs with sliding scale coverage - Bicarb is low but gap is normal at 13 - Continue to monitor      Advance Care Planning:   Code Status: Full Code  Consults: None at this time  Family Communication: Grandmother and mother at bedside  Severity of Illness: The appropriate patient status for this patient is INPATIENT. Inpatient status is judged to be reasonable and necessary in order to provide the required intensity of service to ensure the patient's safety. The patient's presenting symptoms, physical exam findings, and initial radiographic and laboratory data in the context of their chronic comorbidities is felt to place them at high risk for further clinical deterioration. Furthermore, it is not anticipated that the patient will be medically stable for discharge from the hospital within 2 midnights of admission.   * I certify that at the point of admission it is my clinical judgment that the patient will require inpatient hospital care spanning beyond 2 midnights from the point of admission due to high intensity of service, high risk for further deterioration and high frequency of surveillance required.*  Author: Lilyan Gilford,  DO 07/09/2022 6:16 AM  For on call review [email or web address].

## 2022-07-09 NOTE — ED Notes (Signed)
ED Provider at bedside. 

## 2022-07-09 NOTE — Hospital Course (Addendum)
Mrs. Pierman was admitted to the hospital with the working diagnosis of left axilla cellulitis, complicated with sepsis.   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 June 21 she was diagnosed with left axilla cellulitis and was placed on topical clindamycin and oral bactrim for antibiotic therapy.  Apparently she developed a local abscess that drained spontaneously.  She was seen at a local ED and received IV vancomycin and dalbavancin, and discharged home.  Patient developed nausea and diarrhea, prompting her to come to the ED. On her initial physical examination her blood pressure was 135/78, HR 98, RR 18 and 02 saturation 95%, lungs with no wheezing or rales, heart with S1 and S2 present and rhythmic, abdomen with no distention or tenderness, no lower extremity edema, left axilla with edema and erythema, no drainage, but tender to palpation.   Na 133, K 3,9 Cl 100, bicarbonate 20, glucose 332, bun 10 cr 1,29 Lactic acid 3,2- 3,3- 2.8  Wbc 20,1 hgb 16.9 plt 266  Sars covid 19 negative   Chest radiograph with hypoinflation with no effusions or infiltrates, no cardiomegaly.  EKG 128 bpm, normal axis, normal intervals, sinus rhythm with left atrial enlargement, no significant ST segment or T wave changes.   CT chest with no signs of axillary abscess.   Patient has been placed on broad spectrum antibiotics and IV fluids.   07/04 no further hypotension and improvement in her rash as well as wbc.  Transfer to med surg from step down unit. 07/05 continue to improve left axillar rash, she has been hemodynamically stable.  Plan to continue oral antibiotic therapy as outpatient.

## 2022-07-09 NOTE — Assessment & Plan Note (Addendum)
Left axillary cellulitis in the setting of hydradenitis.  Severe sepsis with hypotension.   CT and [name] with no axillary abscess.   Patient was treated with IV fluids and IV antibiotic therapy.  She recovered her blood pressure, remained afebrile and with improving leukocytosis.  At the time of her discharge wbc is 9.0    She had 1,500 mg IV dalbavancin to cover for possible MRSA 07/08/22.  Will continue with Augmenti for 8 more days.   Follow up as outpatient.

## 2022-07-09 NOTE — Progress Notes (Addendum)
Progress Note   Patient: Cheryl Black BJY:[id number] DOB: [birth year] DOA: 07/09/2022     0 DOS: the patient was seen and examined on 07/09/2022   Brief hospital course: Mrs. Poppa was admitted to the hospital with the working diagnosis of left axilla cellulitis, complicated with sepsis.   37 yo female with the past medical history of GERD, asthma, T2DM, hydradenitis, hyperlipidemia, and obesity class 3 who presented with nausea and diarrhea. JUne 21 she was diagnosed with left axilla cellulitis and was placed on topical clindamycin and oral bactrim for antibiotic therapy. Apparently she developed a local abscess that drained spontaneously. She was seen at a local ED and received IV vancomycin, and discharged home. Patient developed nausea and diarrhea, prompting her to come to the ED. On her initial physical examination her blood pressure was 135/78, HR 98, RR 18 and 02 saturation 95%, lungs with no wheezing or rales, heart with S1 and S2 present and rhythmic, abdomen with no distention or tenderness, no lower extremity edema, left axilla with edema and erythema, no drainage, but tender to palpation.   CT chest with no signs of axillary abscess.   Assessment and Plan: * Sepsis due to cellulitis [facility]) Left axillary cellulitis in the setting of hydradenitis.  Patient has been afebrile, wbc is 20.1 Lactate is trending down to 2,8  CT with no abscess. Systolic blood pressure 87 to 93 mmHg.   Plan to continue antibiotic therapy with IV zosyn and add IV vancomycin to cover for possible MRSA. Add bolus NS 1000 ml and then continue at 100 ml per hr.  Close follow up on blood pressure, cell count and temperature curve. Possible volume loss due to gastrointestinal losses.   If recurrent diarrhea will check C diff.   AKI (acute kidney injury) (HCC) Hyponatremia,  Renal function with serum cr at 1,29 with K at 3,9 and serum bicarbonate at 20.  Plan to continue volume resuscitation with NS, follow  up renal function and electrolytes in am.  Avoid hypotension and nephrotoxic medications.  Essential hypertension Continue hooding hydrochlorothiazide in the setting of AKI  Type 2 diabetes mellitus with hyperlipidemia (HCC) Uncontrolled hyperglycemia.   Plan to continue with insulin sliding scale for glucose cover and monitoring. Will add basal insulin, considering her poor oral intake, nausea and vomiting, will do 10 units for now and will adjust as needed.  Continue statin therapy.    GERD (gastroesophageal reflux disease) - Continue Protonix and Pepcid  Menometrorrhagia - Bleeding since April - Heavy bleeding with blood clots reported - Several rounds of Megace has not improved the bleeding - Hemoglobin is stable at 16.9 - Pelvic ultrasound - Monitor CBC in the a.m.  Asthma, chronic - Continue Dulera - Continue as needed albuterol  Class 3 obesity (HCC) Calculated BMI 42,9    Subjective: Patient with no further diarrhea, but having nausea and vomiting. No chest pain, no dyspnea.   Physical Exam: Vitals:   07/09/22 0600 07/09/22 0700 07/09/22 0800 07/09/22 0807  BP: 135/87  (!) 110/58 (!) 110/58  Pulse: (!) 104  95 95  Resp: 20  18 19   Temp:  98.1 F (36.7 C)    TempSrc:  Oral    SpO2: 98%  96% 98%  Weight:      Height:       Neurology awake and alert. Deconditioned ENT with mild pallor Cardiovascular with S1 and S2 present and rhythmic with no gallops, rubs or murmurs Respiratory with no rales or wheezing, no  rhonchi Abdomen with no distention or tenderness No lower extremity edema     Data Reviewed:    Family Communication: I spoke with patient's family at the bedside, we talked in detail about patient's condition, plan of care and prognosis and all questions were addressed.   Disposition: Status is: Inpatient Remains inpatient appropriate because: sepsis IV antibiotics and IV fluids   Planned Discharge Destination: Home    Author: Coralie Keens, MD 07/09/2022 10:34 AM  For on call review [email or web address].

## 2022-07-09 NOTE — ED Provider Notes (Signed)
[facility]-EMERGENCY DEPT [facility] Emergency Department Provider Note MRN:  [id number]  Arrival date & time: 07/09/22     Chief Complaint   Emesis and Diarrhea   History of Present Illness   Cheryl Black is a 37 y.o. year-old female with a history of PCOS, hidradenitis, diabetes presenting to the ED with chief complaint of emesis and diarrhea.  Recent diagnosis of cellulitis to the left axilla, was here in the emergency department and received Dalvance and discharged home.  At home today began having diffuse nausea vomiting and diarrhea.  Review of Systems  A thorough review of systems was obtained and all systems are negative except as noted in the HPI and PMH.   Patient's Health History    Past Medical History:  Diagnosis Date   Acid reflux    Asthma    Clostridium difficile infection    in the past   Diabetes mellitus    Fatty liver    Fibrocystic breast disease    Gout    Hidradenitis    History of kidney stones    Hyperlipemia    Obesity    Polycystic ovary syndrome    PONV (postoperative nausea and vomiting)    Respiratory failure (HCC)    "2018 and 2019"   Sleep apnea    Smoker     Past Surgical History:  Procedure Laterality Date   CHOLECYSTECTOMY  01/10/2011   Procedure: LAPAROSCOPIC CHOLECYSTECTOMY;  Surgeon: Dalia Heading;  Location: [facility] ORS;  Service: General;  Laterality: N/A;   ESOPHAGOGASTRODUODENOSCOPY (EGD) WITH PROPOFOL N/A 09/09/2018   RMR: Mild erosive reflux esophagitis.  Esophagus dilated for history of dysphagia.   HYDRADENITIS EXCISION Right 06/18/2012   Procedure: EXCISION HYDRIADENITIS SUPRATIVA  RIGHT AXILLA;  Surgeon: Marlane Hatcher, MD;  Location: [facility] ORS;  Service: General;  Laterality: Right;   LIVER BIOPSY  01/10/2011   Procedure: LIVER BIOPSY;  Surgeon: Dalia Heading;  Location: [facility] ORS;  Service: General;;   MALONEY DILATION N/A 09/09/2018   Procedure: Elease Hashimoto DILATION;  Surgeon: Corbin Ade, MD;  Location: [facility] ENDO SUITE;   Service: Endoscopy;  Laterality: N/A;   SKIN SPLIT GRAFT Right 06/18/2012   Procedure: SKIN GRAFT SPLIT THICKNESS RIGHT AXILLA(WITH TWO STANDARD SKIN BOARDS 3"x 8" )  DONOR SITE RIGHT AND LEFT THIGHS;  Surgeon: Marlane Hatcher, MD;  Location: [facility] ORS;  Service: General;  Laterality: Right;    Family History  Problem Relation Age of Onset   Other Mother        pre-cancerous cells; had hyst   Emphysema Mother        smoker   COPD Mother    Diabetes Father    Hypertension Father    Cancer Sister        cervical; had hyst   Gout Brother    Hypertension Maternal Grandmother    Heart attack Maternal Grandfather    Cancer Maternal Grandfather        adenocarcinoma   Emphysema Maternal Grandfather        smoked   Hypertension Paternal Grandmother    Diabetes Paternal Grandmother    Gout Paternal Grandmother    Asthma Maternal Uncle    Stroke Other        paternal great grandfather   Colon cancer Neg Hx    Liver disease Neg Hx    Liver cancer Neg Hx     Social History   Socioeconomic History   Marital status: Married  Spouse name: Fayrene Fearing   Number of children: 0   Years of education: Not on file   Highest education level: Not on file  Occupational History   Occupation: nurse  Tobacco Use   Smoking status: Former    Packs/day: 1.00    Years: 10.00    Additional pack years: 0.00    Total pack years: 10.00    Types: Cigarettes    Quit date: 01/06/2018    Years since quitting: 4.5    Passive exposure: Current   Smokeless tobacco: Never  Vaping Use   Vaping Use: Never used  Substance and Sexual Activity   Alcohol use: Not Currently    Comment: occas   Drug use: No   Sexual activity: Yes    Birth control/protection: None  Other Topics Concern   Not on file  Social History Narrative   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   Social Determinants of Health   Financial Resource Strain: Low Risk  (10/04/2020)   Overall Financial Resource Strain (CARDIA)    Difficulty of Paying  Living Expenses: Not hard at all  Food Insecurity: No Food Insecurity (03/26/2022)   Hunger Vital Sign    Worried About Running Out of Food in the Last Year: Never true    Ran Out of Food in the Last Year: Never true  Transportation Needs: No Transportation Needs (03/26/2022)   PRAPARE - Administrator, Civil Service (Medical): No    Lack of Transportation (Non-Medical): No  Physical Activity: Sufficiently Active (10/04/2020)   Exercise Vital Sign    Days of Exercise per Week: 4 days    Minutes of Exercise per Session: 60 min  Stress: No Stress Concern Present (10/04/2020)   [employer] of Occupational Health - Occupational Stress Questionnaire    Feeling of Stress : Only a little  Social Connections: Socially Isolated (10/04/2020)   Social Connection and Isolation Panel [NHANES]    Frequency of Communication with Friends and Family: Once a week    Frequency of Social Gatherings with Friends and Family: Once a week    Attends Religious Services: Never    Database administrator or Organizations: No    Attends Banker Meetings: Never    Marital Status: Married  Catering manager Violence: Not At Risk (03/26/2022)   Humiliation, Afraid, Rape, and Kick questionnaire    Fear of Current or Ex-Partner: No    Emotionally Abused: No    Physically Abused: No    Sexually Abused: No     Physical Exam   Vitals:   07/09/22 0330 07/09/22 0345  BP: 110/88 103/81  Pulse: (!) 120 (!) 115  Resp: 19 (!) 21  Temp:    SpO2: 96% 96%    CONSTITUTIONAL: Well-appearing, NAD NEURO/PSYCH:  Alert and oriented x 3, no focal deficits EYES:  eyes equal and reactive ENT/NECK:  no LAD, no JVD CARDIO: Tachycardic rate, well-perfused, normal S1 and S2 PULM:  CTAB no wheezing or rhonchi GI/GU:  non-distended, non-tender MSK/SPINE:  No gross deformities, no edema SKIN:  no rash, atraumatic   *Additional and/or pertinent findings included in MDM below  Diagnostic and  Interventional Summary    EKG Interpretation Date/Time:  Wednesday July 09 2022 02:26:33 EDT Ventricular Rate:  128 PR Interval:  134 QRS Duration:  74 QT Interval:  301 QTC Calculation: 440 R Axis:   57  Text Interpretation: Sinus tachycardia Anterior infarct, old Confirmed by Kennis Carina [phone]) on 07/09/2022 3:44:52  AM       Labs Reviewed  LACTIC ACID, PLASMA - Abnormal; Notable for the following components:      Result Value   Lactic Acid, Venous 3.2 (*)    All other components within normal limits  COMPREHENSIVE METABOLIC PANEL - Abnormal; Notable for the following components:   Sodium 133 (*)    CO2 20 (*)    Glucose, Bld 332 (*)    Creatinine, Ser 1.29 (*)    AST 74 (*)    ALT 93 (*)    Total Bilirubin 1.9 (*)    GFR, Estimated 54 (*)    All other components within normal limits  CBC WITH DIFFERENTIAL/PLATELET - Abnormal; Notable for the following components:   WBC 20.1 (*)    RBC 5.41 (*)    Hemoglobin 16.9 (*)    HCT 49.8 (*)    Neutro Abs 18.5 (*)    Lymphs Abs 0.3 (*)    Abs Immature Granulocytes 0.14 (*)    All other components within normal limits  RESP PANEL BY RT-PCR (RSV, FLU A&B, COVID)  RVPGX2  CULTURE, BLOOD (ROUTINE X 2)  CULTURE, BLOOD (ROUTINE X 2)  PROTIME-INR  APTT  LACTIC ACID, PLASMA  URINALYSIS, W/ REFLEX TO CULTURE (INFECTION SUSPECTED)  POC URINE PREG, ED    CT Chest Wo Contrast  Final Result    DG Chest Port 1 View  Final Result      Medications  sodium chloride 0.9 % bolus 1,000 mL (0 mLs Intravenous Stopped 07/09/22 0405)  sodium chloride 0.9 % bolus 1,000 mL (0 mLs Intravenous Stopped 07/09/22 0407)  LORazepam (ATIVAN) injection 1 mg (1 mg Intravenous Given 07/09/22 0236)  piperacillin-tazobactam (ZOSYN) IVPB 3.375 g (0 g Intravenous Stopped 07/09/22 0313)  famotidine (PEPCID) IVPB 20 mg premix (0 mg Intravenous Stopped 07/09/22 0405)  HYDROmorphone (DILAUDID) injection 1 mg (1 mg Intravenous Given 07/09/22 0327)  ondansetron  (ZOFRAN) injection 4 mg (4 mg Intravenous Given 07/09/22 0326)     Procedures  /  Critical Care .Critical Care  Performed by: Sabas Sous, MD Authorized by: Sabas Sous, MD   Critical care provider statement:    Critical care time (minutes):  32   Critical care was necessary to treat or prevent imminent or life-threatening deterioration of the following conditions: Concern for sepsis.   Critical care was time spent personally by me on the following activities:  Development of treatment plan with patient or surrogate, discussions with consultants, evaluation of patient's response to treatment, examination of patient, ordering and review of laboratory studies, ordering and review of radiographic studies, ordering and performing treatments and interventions, pulse oximetry, re-evaluation of patient's condition and review of old charts   ED Course and Medical Decision Making  Initial Impression and Ddx Question worsening cellulitis with sepsis and systemic symptoms versus medication side effect.  Doubt anaphylaxis.  With patient's heart rate and leukocytosis, code sepsis initiated.  Discussed with pharmacy, no need for vancomycin coverage given the recent Dalvance, providing Zosyn.  The left axilla looks uncomfortable, question source control issue, also considering necrotizing infection.  Obtaining CT.  Past medical/surgical history that increases complexity of ED encounter: PCOS, diabetes  Interpretation of Diagnostics I personally reviewed the EKG and my interpretation is as follows: Sinus tachycardia  Labs reveal marked leukocytosis, elevated lactic acid, mild AKI.  Patient Reassessment and Ultimate Disposition/Management     CT is without complicating features.  Will admit to medicine.  Patient management required discussion with  the following services or consulting groups:  Hospitalist Service  Complexity of Problems Addressed Acute illness or injury that poses threat of life  of bodily function  Additional Data Reviewed and Analyzed Further history obtained from: Further history from spouse/family member  Additional Factors Impacting ED Encounter Risk Consideration of hospitalization  Elmer Sow. Pilar Plate, MD [facility] Emergency Medicine [facility] [email or web address]  Final Clinical Impressions(s) / ED Diagnoses     ICD-10-CM   1. Nausea vomiting and diarrhea  R11.2    R19.7       ED Discharge Orders     None        Discharge Instructions Discussed with and Provided to Patient:   Discharge Instructions   None      Sabas Sous, MD 07/09/22 [phone]

## 2022-07-09 NOTE — Assessment & Plan Note (Addendum)
Uncontrolled hyperglycemia.   Patient was placed on insulin sliding scale for glucose cover and monitoring.  Basal insulin.  Continue with carb modified diet.   Plan to resume her regular regimen with metformin, GLP1 agonist and SGLT 2 inh.  Will need close follow up as outpatient.   Continue statin therapy.

## 2022-07-09 NOTE — ED Notes (Signed)
Pt transported to CT ?

## 2022-07-09 NOTE — ED Triage Notes (Signed)
Pt began having N/V/D last night. Was seen here yesterday for cellulitis and was given IV antibiotics, has also been taking Bactrim since 7/21. Hx of Cdiff.   25mg  phenergan @0000  PTA

## 2022-07-09 NOTE — Assessment & Plan Note (Addendum)
Blood pressure has improved, plan to resume antihypertensive agents. HZTC

## 2022-07-09 NOTE — ED Notes (Addendum)
New red area to R elbow. Warm, red, itching sensation reported . Marked with skin marker. Area to L axilla also noted to have become larger at upper border. Skin marking updated with time.

## 2022-07-09 NOTE — Assessment & Plan Note (Deleted)
Continue pravastatin 

## 2022-07-09 NOTE — Assessment & Plan Note (Signed)
-   Continue Dulera - Continue as needed albuterol

## 2022-07-09 NOTE — ED Notes (Signed)
Pt has been experiencing vaginal bleeding x47mo, now passing large clots. Experiences pressure in low abd prior to passing a clot each time.

## 2022-07-09 NOTE — Assessment & Plan Note (Addendum)
Hgb is 13.4  Pelvic US with left ovarian 1.5 cm mass indeterminate. Differential possible hemorrhagic cyst or solid mass. Need repeat US in 6 to 12 weeks.   Plan to follow up as outpatient.

## 2022-07-10 ENCOUNTER — Encounter (HOSPITAL_COMMUNITY): Payer: Self-pay | Admitting: Family Medicine

## 2022-07-10 DIAGNOSIS — L0390 Cellulitis, unspecified: Secondary | ICD-10-CM | POA: Diagnosis not present

## 2022-07-10 DIAGNOSIS — I10 Essential (primary) hypertension: Secondary | ICD-10-CM | POA: Diagnosis not present

## 2022-07-10 DIAGNOSIS — N179 Acute kidney failure, unspecified: Secondary | ICD-10-CM | POA: Diagnosis not present

## 2022-07-10 DIAGNOSIS — E785 Hyperlipidemia, unspecified: Secondary | ICD-10-CM

## 2022-07-10 DIAGNOSIS — E1169 Type 2 diabetes mellitus with other specified complication: Secondary | ICD-10-CM | POA: Diagnosis not present

## 2022-07-10 LAB — COMPREHENSIVE METABOLIC PANEL
ALT: 46 U/L — ABNORMAL HIGH (ref 0–44)
AST: 25 U/L (ref 15–41)
Albumin: 2.6 g/dL — ABNORMAL LOW (ref 3.5–5.0)
Alkaline Phosphatase: 37 U/L — ABNORMAL LOW (ref 38–126)
Anion gap: 5 (ref 5–15)
BUN: 8 mg/dL (ref 6–20)
CO2: 22 mmol/L (ref 22–32)
Calcium: 7.8 mg/dL — ABNORMAL LOW (ref 8.9–10.3)
Chloride: 112 mmol/L — ABNORMAL HIGH (ref 98–111)
Creatinine, Ser: 0.68 mg/dL (ref 0.44–1.00)
GFR, Estimated: >60 mL/min (ref >60)
Glucose, Bld: 187 mg/dL — ABNORMAL HIGH (ref 70–99)
Potassium: 3.2 mmol/L — ABNORMAL LOW (ref 3.5–5.1)
Sodium: 139 mmol/L (ref 135–145)
Total Bilirubin: 0.4 mg/dL (ref 0.3–1.2)
Total Protein: 5.5 g/dL — ABNORMAL LOW (ref 6.5–8.1)

## 2022-07-10 LAB — CBC WITH DIFFERENTIAL/PLATELET
Abs Immature Granulocytes: 0.05 10*3/uL (ref 0.00–0.07)
Basophils Absolute: 0 10*3/uL (ref 0.0–0.1)
Basophils Relative: 0 %
Eosinophils Absolute: 0.5 10*3/uL (ref 0.0–0.5)
Eosinophils Relative: 4 %
HCT: 37.9 % (ref 36.0–46.0)
Hemoglobin: 12.6 g/dL (ref 12.0–15.0)
Immature Granulocytes: 0 %
Lymphocytes Relative: 21 %
Lymphs Abs: 2.6 10*3/uL (ref 0.7–4.0)
MCH: 31.2 pg (ref 26.0–34.0)
MCHC: 33.2 g/dL (ref 30.0–36.0)
MCV: 93.8 fL (ref 80.0–100.0)
Monocytes Absolute: 0.6 10*3/uL (ref 0.1–1.0)
Monocytes Relative: 5 %
Neutro Abs: 8.7 10*3/uL — ABNORMAL HIGH (ref 1.7–7.7)
Neutrophils Relative %: 70 %
Platelets: 208 10*3/uL (ref 150–400)
RBC: 4.04 MIL/uL (ref 3.87–5.11)
RDW: 13.2 % (ref 11.5–15.5)
WBC: 12.4 10*3/uL — ABNORMAL HIGH (ref 4.0–10.5)
nRBC: 0 % (ref 0.0–0.2)

## 2022-07-10 LAB — GLUCOSE, CAPILLARY
Glucose-Capillary: 175 mg/dL — ABNORMAL HIGH (ref 70–99)
Glucose-Capillary: 216 mg/dL — ABNORMAL HIGH (ref 70–99)
Glucose-Capillary: 204 mg/dL — ABNORMAL HIGH (ref 70–99)
Glucose-Capillary: 219 mg/dL — ABNORMAL HIGH (ref 70–99)

## 2022-07-10 LAB — CULTURE, BLOOD (ROUTINE X 2): Culture: NO GROWTH

## 2022-07-10 LAB — MAGNESIUM: Magnesium: 2 mg/dL (ref 1.7–2.4)

## 2022-07-10 MED ORDER — ENOXAPARIN SODIUM 60 MG/0.6ML IJ SOSY: 60.0000 mg | PREFILLED_SYRINGE | INTRAMUSCULAR | Status: DC

## 2022-07-10 MED ORDER — ENOXAPARIN SODIUM 60 MG/0.6ML IJ SOSY
60.0000 mg | PREFILLED_SYRINGE | INTRAMUSCULAR | Status: DC
  Administered 2022-07-10: 60 mg via SUBCUTANEOUS
  Filled 2022-07-10: qty 0.6

## 2022-07-10 MED ORDER — POTASSIUM CHLORIDE CRYS ER 20 MEQ PO TBCR
40.0000 meq | EXTENDED_RELEASE_TABLET | ORAL | Status: AC
  Administered 2022-07-10 (×2): 40 meq via ORAL
  Filled 2022-07-10 (×2): qty 2

## 2022-07-10 MED ORDER — ENOXAPARIN SODIUM 40 MG/0.4ML IJ SOSY: 40.0000 mg | PREFILLED_SYRINGE | INTRAMUSCULAR | Status: DC

## 2022-07-10 NOTE — Progress Notes (Signed)
Patient transferred from ICU. Patient is alert, oriented and ambulatory. No complaints of pain or discomfort and no needs at this time. Resting with family at bedside.

## 2022-07-10 NOTE — Progress Notes (Signed)
Progress Note   Patient: Cheryl Black ZOX:[id number] DOB: Dec 14, 1985 DOA: 07/09/2022     1 DOS: the patient was seen and examined on 07/10/2022   Brief hospital course: Cheryl Black was admitted to the hospital with the working diagnosis of left axilla cellulitis, complicated with sepsis.   37 yo female with the past medical history of GERD, asthma, T2DM, hydradenitis, hyperlipidemia, and obesity class 3 who presented with nausea and diarrhea. JUne 21 she was diagnosed with left axilla cellulitis and was placed on topical clindamycin and oral bactrim for antibiotic therapy. Apparently she developed a local abscess that drained spontaneously. She was seen at a local ED and received IV vancomycin, and discharged home. Patient developed nausea and diarrhea, prompting her to come to the ED. On her initial physical examination her blood pressure was 135/78, HR 98, RR 18 and 02 saturation 95%, lungs with no wheezing or rales, heart with S1 and S2 present and rhythmic, abdomen with no distention or tenderness, no lower extremity edema, left axilla with edema and erythema, no drainage, but tender to palpation.   Na 133, K 3,9 Cl 100, bicarbonate 20, glucose 332, bun 10 cr 1,29 Lactic acid 3,2- 3,3- 2.8  Wbc 20,1 hgb 16.9 plt 266  Sars covid 19 negative   Chest radiograph with hypoinflation with no effusions or infiltrates, no cardiomegaly.  EKG 128 bpm, normal axis, normal intervals, sinus rhythm with left atrial enlargement, no significant ST segment or T wave changes.   CT chest with no signs of axillary abscess.   Patient has been placed on broad spectrum antibiotics and IV fluids.   07/04 no further hypotension and improvement in her rash as well as wbc.  Transfer to med surg from step down unit. Possible discharge home tomorrow with oral antibiotic therapy.   Assessment and Plan: * Sepsis due to cellulitis [facility]) Left axillary cellulitis in the setting of hydradenitis.  Severe sepsis with  hypotension.  Patient has been afebrile, wbc is 12.4 CT and [name] with no axillary abscess.  Continue afebrile and hypotension has resolved.   Plan to continue antibiotic therapy with IV zosyn, she had dalbavancin to cover for possible MRSA 07/08/22  Ok to transfer to med surg with no telemetry. If continue to improve, possible discharge home tomorrow with oral antibiotic therapy.  AKI (acute kidney injury) (HCC) Hyponatremia, hypokalemia.   Patient had successful volume resuscitation yesterday. Today blood pressure systolic is 100 to 105 mmHg.   Follow up renal function with serum cr at 0,68, K is 3,2 and serum bicarbonate at 22. Na 139, CL 112 and Mg 2.0   Plan to add 40 meq Kcl x 2 doses. Hold on further IV fluids.  Follow up renal function and electrolytes in am, avoid hypotension and nephrotoxic medications.   Essential hypertension Blood pressure has improved. Plan to continue holding antihypertensive medications.   Type 2 diabetes mellitus with hyperlipidemia (HCC) Uncontrolled hyperglycemia.   Plan to continue with insulin sliding scale for glucose cover and monitoring. Basal insulin 10 units. Fasting glucose today is 187, capillary has been 175, 173 and 175. Patient tolerating po well.  Continue with carb modified diet.   Continue statin therapy.    GERD (gastroesophageal reflux disease) - Continue Protonix and Pepcid  Menometrorrhagia Hgb is 12.9 Plan to follow up as outpatient.   Asthma, chronic - Continue Dulera - Continue as needed albuterol  Class 3 obesity (HCC) Calculated BMI 42,9         Subjective:  Patient is feeling better, no nausea, vomiting or diarrhea, right axillary rash has improved, decreased erythema, edema and tenderness.   Physical Exam: Vitals:   07/10/22 0700 07/10/22 0755 07/10/22 0800 07/10/22 0813  BP: (!) 100/58  105/64   Pulse: 75  87 83  Resp: 12  16 19   Temp:  99.2 F (37.3 C)    TempSrc:  Oral    SpO2: 98%  97% 99%   Weight:      Height:       Neurology awake and alert ENT with no pallor Cardiovascular with S1 and S2 present and rhythmic with no gallops, rubs or murmurs Respiratory with no rales or wheezing, no rhonchi Abdomen with no distention  No lower extremity edema Right axillary rash with significant improvement, now is only mild with no edema.  Data Reviewed:    Family Communication: her family is at the bedside   Disposition: Status is: Inpatient Remains inpatient appropriate because: IV antibiotic therapy   Planned Discharge Destination: Home    Author: Coralie Keens, MD 07/10/2022 9:10 AM  For on call review [email or web address].

## 2022-07-11 DIAGNOSIS — N179 Acute kidney failure, unspecified: Secondary | ICD-10-CM | POA: Diagnosis not present

## 2022-07-11 DIAGNOSIS — E1169 Type 2 diabetes mellitus with other specified complication: Secondary | ICD-10-CM | POA: Diagnosis not present

## 2022-07-11 DIAGNOSIS — I10 Essential (primary) hypertension: Secondary | ICD-10-CM | POA: Diagnosis not present

## 2022-07-11 DIAGNOSIS — L0390 Cellulitis, unspecified: Secondary | ICD-10-CM | POA: Diagnosis not present

## 2022-07-11 LAB — BASIC METABOLIC PANEL
Anion gap: 5 (ref 5–15)
BUN: 8 mg/dL (ref 6–20)
CO2: 21 mmol/L — ABNORMAL LOW (ref 22–32)
Calcium: 8 mg/dL — ABNORMAL LOW (ref 8.9–10.3)
Chloride: 110 mmol/L (ref 98–111)
Creatinine, Ser: 0.58 mg/dL (ref 0.44–1.00)
GFR, Estimated: >60 mL/min (ref >60)
Glucose, Bld: 230 mg/dL — ABNORMAL HIGH (ref 70–99)
Potassium: 3.8 mmol/L (ref 3.5–5.1)
Sodium: 136 mmol/L (ref 135–145)

## 2022-07-11 LAB — GLUCOSE, CAPILLARY
Glucose-Capillary: 218 mg/dL — ABNORMAL HIGH (ref 70–99)
Glucose-Capillary: 244 mg/dL — ABNORMAL HIGH (ref 70–99)

## 2022-07-11 LAB — CBC
HCT: 40.4 % (ref 36.0–46.0)
Hemoglobin: 13.4 g/dL (ref 12.0–15.0)
MCH: 31.4 pg (ref 26.0–34.0)
MCHC: 33.2 g/dL (ref 30.0–36.0)
MCV: 94.6 fL (ref 80.0–100.0)
Platelets: 210 10*3/uL (ref 150–400)
RBC: 4.27 MIL/uL (ref 3.87–5.11)
RDW: 13.1 % (ref 11.5–15.5)
WBC: 9 10*3/uL (ref 4.0–10.5)
nRBC: 0 % (ref 0.0–0.2)

## 2022-07-11 LAB — CULTURE, BLOOD (ROUTINE X 2)
Culture: NO GROWTH
Special Requests: ADEQUATE

## 2022-07-11 MED ORDER — CEPHALEXIN 250 MG PO CAPS: 250.0000 mg | ORAL_CAPSULE | Freq: Four times a day (QID) | ORAL | 0 refills | Status: AC

## 2022-07-11 MED ORDER — CEPHALEXIN 500 MG PO CAPS: 500.0000 mg | ORAL_CAPSULE | Freq: Four times a day (QID) | ORAL | Status: DC

## 2022-07-11 MED ORDER — CEPHALEXIN 250 MG PO CAPS
250.0000 mg | ORAL_CAPSULE | Freq: Four times a day (QID) | ORAL | Status: DC
  Filled 2022-07-11 (×13): qty 1

## 2022-07-11 MED ORDER — METOCLOPRAMIDE HCL 10 MG PO TABS: 10.0000 mg | ORAL_TABLET | Freq: Three times a day (TID) | ORAL | 0 refills | Status: AC | PRN

## 2022-07-11 NOTE — Discharge Summary (Signed)
Physician Discharge Summary   Patient: Cheryl Black MRN: [id number] DOB: 09-18-1985  Admit date:     07/09/2022  Discharge date: 07/11/22  Discharge Physician: York Ram Daphney Hopke   PCP: Benita Stabile, MD   Recommendations at discharge:    Continue oral antibiotic therapy with cephalexin, she had received one dose of IV dalbavacin on 07/02. Follow up as outpatient with Dr Margo Aye. Follow up pelvic US in 6 to 12 weeks to follow up on left ovarian cyst/ mass.   Discharge Diagnoses: Principal Problem:   Sepsis due to cellulitis [facility]) Active Problems:   AKI (acute kidney injury) (HCC)   Essential hypertension   Type 2 diabetes mellitus with hyperlipidemia (HCC)   GERD (gastroesophageal reflux disease)   Menometrorrhagia   Asthma, chronic   Class 3 obesity (HCC)  Resolved Problems:   * No resolved hospital problems. [facility] Course: Cheryl Black was admitted to the hospital with the working diagnosis of left axilla cellulitis, complicated with sepsis.   37 yo female with the past medical history of GERD, asthma, T2DM, hydradenitis, hyperlipidemia, and obesity class 3 who presented with nausea and diarrhea. June 21 she was diagnosed with left axilla cellulitis and was placed on topical clindamycin and oral bactrim for antibiotic therapy.  Apparently she developed a local abscess that drained spontaneously.  She was seen at a local ED and received IV vancomycin and dalbavancin, and discharged home.  Patient developed nausea and diarrhea, prompting her to come to the ED. On her initial physical examination her blood pressure was 135/78, HR 98, RR 18 and 02 saturation 95%, lungs with no wheezing or rales, heart with S1 and S2 present and rhythmic, abdomen with no distention or tenderness, no lower extremity edema, left axilla with edema and erythema, no drainage, but tender to palpation.   Na 133, K 3,9 Cl 100, bicarbonate 20, glucose 332, bun 10 cr 1,29 Lactic acid 3,2- 3,3- 2.8  Wbc  20,1 hgb 16.9 plt 266  Sars covid 19 negative   Chest radiograph with hypoinflation with no effusions or infiltrates, no cardiomegaly.  EKG 128 bpm, normal axis, normal intervals, sinus rhythm with left atrial enlargement, no significant ST segment or T wave changes.   CT chest with no signs of axillary abscess.   Patient has been placed on broad spectrum antibiotics and IV fluids.   07/04 no further hypotension and improvement in her rash as well as wbc.  Transfer to med surg from step down unit. 07/05 continue to improve left axillar rash, she has been hemodynamically stable.  Plan to continue oral antibiotic therapy as outpatient.     Assessment and Plan: * Sepsis due to cellulitis [facility]) Left axillary cellulitis in the setting of hydradenitis.  Severe sepsis with hypotension.   CT and [name] with no axillary abscess.   Patient was treated with IV fluids and IV antibiotic therapy.  She recovered her blood pressure, remained afebrile and with improving leukocytosis.  At the time of her discharge wbc is 9.0    She had 1,500 mg IV dalbavancin to cover for possible MRSA 07/08/22.  Will continue with Augmenti for 8 more days.   Follow up as outpatient.   AKI (acute kidney injury) (HCC) Patient received IV fluids with improvement in her renal function. Electrolytes were corrected.  At the time of her discharge her renal function had a serum cr of 0,58, K is 3,8 and serum bicarbonate at 21. Na 136.   Plan to follow  up renal function as outpatient.   Essential hypertension Blood pressure has improved, plan to resume antihypertensive agents. HZTC  Type 2 diabetes mellitus with hyperlipidemia (HCC) Uncontrolled hyperglycemia.   Patient was placed on insulin sliding scale for glucose cover and monitoring.  Basal insulin.  Continue with carb modified diet.   Plan to resume her regular regimen with metformin, GLP1 agonist and SGLT 2 inh.  Will need close follow up as  outpatient.   Continue statin therapy.    GERD (gastroesophageal reflux disease) Continue antiacid therapy with pantoprazole.   Menometrorrhagia Hgb is 13.4  Pelvic US with left ovarian 1.5 cm mass indeterminate. Differential possible hemorrhagic cyst or solid mass. Need repeat US in 6 to 12 weeks.   Plan to follow up as outpatient.   Asthma, chronic - Continue Dulera - Continue as needed albuterol  Class 3 obesity (HCC) Calculated BMI 42,9          Consultants: none  Procedures performed: none  Disposition: Home Diet recommendation:  Cardiac and Carb modified diet DISCHARGE MEDICATION: Allergies as of 07/11/2022       Reactions   Ace Inhibitors Anaphylaxis   Beta Adrenergic Blockers Other (See Comments)   Angioedema Other reaction(s): Angioedema   Augmentin [amoxicillin-pot Clavulanate] Other (See Comments)   Has had C.diff in the past Did it involve swelling of the face/tongue/throat, SOB, or low BP? No Did it involve sudden or severe rash/hives, skin peeling, or any reaction on the inside of your mouth or nose? No Did you need to seek medical attention at a hospital or doctor's office? No When did it last happen?Unknown If all above answers are "NO", may proceed with cephalosporin use.   Benicar [olmesartan] Other (See Comments)   Facial edema   Doxycycline Other (See Comments)   Makes feet and hands red   Egg-derived Products    Local reaction with injectable meds that contain eggs   Influenza Vaccine Live Swelling   Arm    Influenza Virus Vaccine Hives   Invokana [canagliflozin] Other (See Comments)   Dehydration   Liraglutide Nausea And Vomiting        Medication List     STOP taking these medications    fexofenadine 180 MG tablet Commonly known as: ALLEGRA   insulin lispro 100 UNIT/ML KwikPen Commonly known as: HumaLOG KwikPen   sulfamethoxazole-trimethoprim 800-160 MG tablet Commonly known as: BACTRIM DS   Tresiba FlexTouch 200  UNIT/ML FlexTouch Pen Generic drug: insulin degludec       TAKE these medications    acetaminophen 500 MG tablet Commonly known as: TYLENOL Take 1,000 mg by mouth every 6 (six) hours as needed (for pain/headaches.).   Advair HFA 115-21 MCG/ACT inhaler Generic drug: fluticasone-salmeterol Inhale 2 puffs into the lungs 2 (two) times daily.   albuterol 108 (90 Base) MCG/ACT inhaler Commonly known as: Ventolin HFA Inhale 2 puffs into the lungs every 4 (four) - 6 (six)  hours as needed for shortness of breath.   albuterol (2.5 MG/3ML) 0.083% nebulizer solution Commonly known as: PROVENTIL Inhale 3 mLs (2.5 mg total) by nebulization every 2 (two) hours as needed for wheezing or shortness of breath.   allopurinol 300 MG tablet Commonly known as: ZYLOPRIM Take 1 tablet (300 mg total) by mouth daily.   Azelastine-Fluticasone 137-50 MCG/ACT Susp Place 1 spray into the nose in the morning and at bedtime.   BIOTIN PO Take by mouth.   butalbital-acetaminophen-caffeine 50-325-40 MG tablet Commonly known as: FIORICET Take 1 tablet  by mouth daily as needed (headaches.).   cephALEXin 250 MG capsule Commonly known as: KEFLEX Take 1 capsule (250 mg total) by mouth every 6 (six) hours for 8 days.   colchicine 0.6 MG tablet Take 2 tablets by mouth then take 1 tablet by mouth 6 hours later as directed.   Dexcom G6 Sensor Misc Use as directed change every 10 days   Dexcom G7 Sensor Misc Use as directed for blood glucose monitoring, replace every 10 days.   Dexcom G6 Transmitter Misc Use as directed every 90 days   Dexcom G6 Transmitter Misc Use as directed with Dexcom sensor, replace every 90 days   Farxiga 5 MG Tabs tablet Generic drug: dapagliflozin propanediol Take 1 tablet (5 mg total) by mouth daily.   freestyle lancets Use to test blood sugar 2-3 times daily   FREESTYLE LITE test strip Generic drug: glucose blood Use 2-3 times daily to test blood sugar   FreeStyle  Lite w/Device Kit Use to test blood sugar   Gvoke HypoPen 2-Pack 1 MG/0.2ML Soaj Generic drug: Glucagon use under the skin for hypoglycemia as needed   hydrochlorothiazide 12.5 MG tablet Commonly known as: HYDRODIURIL Take 1 tablet (12.5 mg total) by mouth daily for high blood pressure.   loratadine 10 MG tablet Commonly known as: CLARITIN Take 1 tablet (10 mg total) by mouth daily.   megestrol 40 MG tablet Commonly known as: MEGACE Take 3 tablets (120 mg total) by mouth daily. 120 mg daily for 24 days then 4 days off.  Repeat the cycle   metFORMIN 500 MG 24 hr tablet Commonly known as: GLUCOPHAGE-XR Take 2 tablets (1,000 mg total) by mouth daily.   metoCLOPramide 10 MG tablet Commonly known as: REGLAN Take 1 tablet (10 mg total) by mouth every 8 (eight) hours as needed for nausea. What changed:  how much to take how to take this when to take this reasons to take this   Mounjaro 2.5 MG/0.5ML Pen Generic drug: tirzepatide Inject 2.5 mg into the skin once a week.   Mounjaro 5 MG/0.5ML Pen Generic drug: tirzepatide Inject 5 mg into the skin once a week.   mupirocin ointment 2 % Commonly known as: BACTROBAN Apply 1 Application topically daily.   pantoprazole 40 MG tablet Commonly known as: PROTONIX Take 1 tablet (40 mg total) by mouth 2 (two) times daily.   potassium chloride 10 MEQ tablet Commonly known as: KLOR-CON Take 1 tablet (10 mEq total) by mouth daily.   pravastatin 20 MG tablet Commonly known as: PRAVACHOL Take 1 tablet (20 mg total) by mouth Nightly for cholesterol What changed: additional instructions   Probiotic (Lactobacillus) Caps   TechLite Pen Needles 32G X 4 MM Misc Generic drug: Insulin Pen Needle Use as directed to inject insulin 4 times a day   tranexamic acid 650 MG Tabs tablet Commonly known as: Lysteda Take 2 tablets (1,300 mg total) by mouth 3 (three) times daily. For 5 days   VITAMIN D PO Take by mouth.        Discharge  Exam: Filed Weights   07/09/22 0553  Weight: 128.1 kg   BP 120/76 (BP Location: Right Arm)   Pulse 73   Temp 97.7 F (36.5 C) (Oral)   Resp 20   Ht 5\' 8"  (1.727 m)   Wt 128.1 kg   SpO2 98%   BMI 42.94 kg/m   Patient is feeling better, left axilla with improved rash and pain, no nausea or vomiting, no diarrhea.  Neurology awake and alert ENT with mild pallor with no icterus Cardiovascular with S1 and S2 present and rhythmic Respiratory with no rales or wheezing Abdomen with no distention  No lower extremity edema  Left axilla with significant improvement in edema, and erythema,     Condition at discharge: stable  The results of significant diagnostics from this hospitalization (including imaging, microbiology, ancillary and laboratory) are listed below for reference.   Imaging Studies: US PELVIC COMPLETE WITH TRANSVAGINAL  Result Date: 07/09/2022 CLINICAL DATA:  Menorrhagia and bleeding since April EXAM: TRANSABDOMINAL AND TRANSVAGINAL ULTRASOUND OF PELVIS TECHNIQUE: Both transabdominal and transvaginal ultrasound examinations of the pelvis were performed. Transabdominal technique was performed for global imaging of the pelvis including uterus, ovaries, adnexal regions, and pelvic cul-de-sac. It was necessary to proceed with endovaginal exam following the transabdominal exam to visualize the the uterus, endometrium, and ovaries. COMPARISON:  Pelvic ultrasound March 04, 2021, CT abdomen and pelvis contrast January 31, 2021 FINDINGS: Uterus Measurements: 10.5 x 3.8 x 6.2 cm = volume: 128.1 mL. No fibroids or other mass visualized. Endometrium Thickness: 9 mm.  No focal abnormality visualized. Right ovary Measurements: 3.4 x 2.7 x 2.7 cm = volume: 13.0 mL. Normal appearance/no adnexal mass. Left ovary Measurements: 3.1 x 2.2 x 2.8 cm = volume: 10.2 mL. There is a 1.5 x 1.1 x 1.4 cm hypoechoic mass within the medial aspect of the left ovary, without internal vascularity. Benign  simple cyst measuring 1.6 x 1.2 x 1.5 cm. No follow-up for this is recommended. Other findings No abnormal free fluid. IMPRESSION: Left ovarian 1.5 cm hypoechoic mass is indeterminate. Differential is considerations include hemorrhagic cyst versus solid mass. Recommend repeat pelvic ultrasound in 6-12 weeks for reassessment. Electronically Signed   By: Jacob Moores M.D.   On: 07/09/2022 14:13   CT Chest Wo Contrast  Result Date: 07/09/2022 CLINICAL DATA:  Soft tissue infection suspected, left axillary cellulitis-question abscess. EXAM: CT CHEST WITHOUT CONTRAST TECHNIQUE: Multidetector CT imaging of the chest was performed following the standard protocol without IV contrast. RADIATION DOSE REDUCTION: This exam was performed according to the departmental dose-optimization program which includes automated exposure control, adjustment of the mA and/or kV according to patient size and/or use of iterative reconstruction technique. COMPARISON:  03/21/2018 FINDINGS: Cardiovascular: No significant vascular findings. Normal heart size. No pericardial effusion. Mediastinum/Nodes: No mass or adenopathy Lungs/Pleura: There is no edema, consolidation, effusion, or pneumothorax. Upper Abdomen: Cholecystectomy Musculoskeletal: No acute or aggressive finding. No covered axillary cellulitis and no soft tissue abscess IMPRESSION: Negative chest CT.  No evidence of abscess. Electronically Signed   By: Tiburcio Pea M.D.   On: 07/09/2022 04:17   DG Chest Port 1 View  Result Date: 07/09/2022 CLINICAL DATA:  Possible sepsis EXAM: PORTABLE CHEST 1 VIEW COMPARISON:  03/26/2022 FINDINGS: The heart size and mediastinal contours are within normal limits. Both lungs are clear. The visualized skeletal structures are unremarkable. IMPRESSION: No active disease. Electronically Signed   By: Alcide Clever M.D.   On: 07/09/2022 02:52   [name] AXILLA LEFT  Result Date: 07/08/2022 CLINICAL DATA:  Left axillary redness and swelling. EXAM:  ULTRASOUND OF THE LEFT AXILLA COMPARISON:  None available. FINDINGS: Ultrasound is performed, showing no appreciable fluid collection or abscess. IMPRESSION: No appreciable fluid collection or abscess. Electronically Signed   By: Larose Hires D.O.   On: 07/08/2022 17:11   Microbiology: Results for orders placed or performed during the hospital encounter of 07/09/22  Blood Culture (routine x 2)  Status: None (Preliminary result)   Collection Time: 07/09/22  2:31 AM   Specimen: Right Antecubital; Blood  Result Value Ref Range Status   Specimen Description RIGHT ANTECUBITAL  Final   Special Requests   Final    BOTTLES DRAWN AEROBIC AND ANAEROBIC Blood Culture adequate volume   Culture   Final    NO GROWTH 2 DAYS Performed at [facility], [address], [place], [place] [address]    Report Status PENDING  Incomplete  Resp panel by RT-PCR (RSV, Flu A&B, Covid) Anterior Nasal Swab     Status: None   Collection Time: 07/09/22  2:40 AM   Specimen: Anterior Nasal Swab  Result Value Ref Range Status   SARS Coronavirus 2 by RT PCR NEGATIVE NEGATIVE Final    Comment: (NOTE) SARS-CoV-2 target nucleic acids are NOT DETECTED.  The SARS-CoV-2 RNA is generally detectable in upper respiratory specimens during the acute phase of infection. The lowest concentration of SARS-CoV-2 viral copies this assay can detect is 138 copies/mL. A negative result does not preclude SARS-Cov-2 infection and should not be used as the sole basis for treatment or other patient management decisions. A negative result may occur with  improper specimen collection/handling, submission of specimen other than nasopharyngeal swab, presence of viral mutation(s) within the areas targeted by this assay, and inadequate number of viral copies(<138 copies/mL). A negative result must be combined with clinical observations, patient history, and epidemiological information. The expected result is Negative.  Fact Sheet for  Patients:  BloggerCourse.com  Fact Sheet for Healthcare Providers:  SeriousBroker.it  This test is no t yet approved or cleared by the [place] FDA and  has been authorized for detection and/or diagnosis of SARS-CoV-2 by FDA under an Emergency Use Authorization (EUA). This EUA will remain  in effect (meaning this test can be used) for the duration of the COVID-19 declaration under Section 564(b)(1) of the Act, 21 U.S.C.section 360bbb-3(b)(1), unless the authorization is terminated  or revoked sooner.       Influenza A by PCR NEGATIVE NEGATIVE Final   Influenza B by PCR NEGATIVE NEGATIVE Final    Comment: (NOTE) The Xpert Xpress SARS-CoV-2/FLU/RSV plus assay is intended as an aid in the diagnosis of influenza from Nasopharyngeal swab specimens and should not be used as a sole basis for treatment. Nasal washings and aspirates are unacceptable for Xpert Xpress SARS-CoV-2/FLU/RSV testing.  Fact Sheet for Patients: BloggerCourse.com  Fact Sheet for Healthcare Providers: SeriousBroker.it  This test is not yet approved or cleared by the [place] FDA and has been authorized for detection and/or diagnosis of SARS-CoV-2 by FDA under an Emergency Use Authorization (EUA). This EUA will remain in effect (meaning this test can be used) for the duration of the COVID-19 declaration under Section 564(b)(1) of the Act, 21 U.S.C. section 360bbb-3(b)(1), unless the authorization is terminated or revoked.     Resp Syncytial Virus by PCR NEGATIVE NEGATIVE Final    Comment: (NOTE) Fact Sheet for Patients: BloggerCourse.com  Fact Sheet for Healthcare Providers: SeriousBroker.it  This test is not yet approved or cleared by the [place] FDA and has been authorized for detection and/or diagnosis of SARS-CoV-2 by FDA under an Emergency  Use Authorization (EUA). This EUA will remain in effect (meaning this test can be used) for the duration of the COVID-19 declaration under Section 564(b)(1) of the Act, 21 U.S.C. section 360bbb-3(b)(1), unless the authorization is terminated or revoked.  Performed at [facility], [address] [place], [place]  [address]   Blood Culture (routine x 2)     Status: None (Preliminary result)   Collection Time: 07/09/22  2:40 AM   Specimen: Left Antecubital; Blood  Result Value Ref Range Status   Specimen Description LEFT ANTECUBITAL  Final   Special Requests   Final    BOTTLES DRAWN AEROBIC AND ANAEROBIC Blood Culture adequate volume   Culture   Final    NO GROWTH 2 DAYS Performed at [facility], [address], [place], [place] [address]    Report Status PENDING  Incomplete  MRSA Next Gen by PCR, Nasal     Status: None   Collection Time: 07/09/22  6:14 AM   Specimen: Nasal Mucosa; Nasal Swab  Result Value Ref Range Status   MRSA by PCR Next Gen NOT DETECTED NOT DETECTED Final    Comment: (NOTE) The GeneXpert MRSA Assay (FDA approved for NASAL specimens only), is one component of a comprehensive MRSA colonization surveillance program. It is not intended to diagnose MRSA infection nor to guide or monitor treatment for MRSA infections. Test performance is not FDA approved in patients less than 59 years old. Performed at [facility], [address] [place], [place] [address]     Labs: CBC: Recent Labs  Lab 07/08/22 1405 07/09/22 0231 07/10/22 0520 07/11/22 0514  WBC 10.0 20.1* 12.4* 9.0  NEUTROABS 6.8 18.5* 8.7*  --   HGB 15.8* 16.9* 12.6 13.4  HCT 46.6* 49.8* 37.9 40.4  MCV 90.3 92.1 93.8 94.6  PLT 264 266 208 210   Basic Metabolic Panel: Recent Labs  Lab 07/08/22 1405 07/09/22 0231 07/10/22 0520 07/11/22 0514  NA 136 133* 139 136  K 3.4* 3.9 3.2* 3.8  CL 104 100 112* 110  CO2 23 20* 22 21*  GLUCOSE 164* 332* 187* 230*  BUN 7 10 8 8   CREATININE 0.82 1.29*  0.68 0.58  CALCIUM 8.9 9.0 7.8* 8.0*  MG  --   --  2.0  --    Liver Function Tests: Recent Labs  Lab 07/09/22 0231 07/10/22 0520  AST 74* 25  ALT 93* 46*  ALKPHOS 60 37*  BILITOT 1.9* 0.4  PROT 7.7 5.5*  ALBUMIN 3.9 2.6*   CBG: Recent Labs  Lab 07/10/22 1134 07/10/22 1644 07/10/22 2120 07/11/22 0722 07/11/22 1118  GLUCAP 216* 204* 219* 218* 244*    Discharge time spent: greater than 30 minutes.  Signed: Coralie Keens, MD [facility] 07/11/2022

## 2022-07-11 NOTE — Progress Notes (Signed)
Patient slept through this shift only waking when staff entered the room. Patient did complain of some pain at beginning of shit but denied, needing medication at that time, she stated "I will let you know when I need some." No further complaints this shift. Continued to monitor patient.

## 2022-07-11 NOTE — Inpatient Diabetes Management (Signed)
Inpatient Diabetes Program Recommendations  AACE/ADA: New Consensus Statement on Inpatient Glycemic Control (2015)  Target Ranges:  Prepandial:   less than 140 mg/dL      Peak postprandial:   less than 180 mg/dL (1-2 hours)      Critically ill patients:  140 - 180 mg/dL   Lab Results  Component Value Date   GLUCAP 218 (H) 07/11/2022   HGBA1C 7.2 (H) 03/26/2022    Latest Reference Range & Units 07/10/22 11:34 07/10/22 16:44 07/10/22 21:20 07/11/22 07:22  Glucose-Capillary 70 - 99 mg/dL 161 (H) 096 (H) 045 (H) 218 (H)  (H): Data is abnormally high Review of Glycemic Control  Diabetes history: type 2 Outpatient Diabetes medications: Farxiga 5 mg daily, Metformin XR 1000 mg daily, Mounjaro 5 mg weekly Current orders for Inpatient glycemic control: Semglee 10 units daily, Novolog 0-15 units TID correction scale, Novolog 0-5 units HS scale  Inpatient Diabetes Program Recommendations:   Noted that blood sugars have been greater than 180 mg/dl.   Recommend adding Novolog 3 units TID with meals if patient eats at least 50% of meal and if blood sugars continue to be elevated.   Smith Mince RN BSN CDE Diabetes Coordinator Pager: [phone]  8am-5pm

## 2022-07-11 NOTE — Assessment & Plan Note (Signed)
Patient received IV fluids with improvement in her renal function. Electrolytes were corrected.  At the time of her discharge her renal function had a serum cr of 0,58, K is 3,8 and serum bicarbonate at 21. Na 136.   Plan to follow up renal function as outpatient.

## 2022-07-12 LAB — CULTURE, BLOOD (ROUTINE X 2)

## 2022-07-13 LAB — CULTURE, BLOOD (ROUTINE X 2)

## 2022-07-14 LAB — CULTURE, BLOOD (ROUTINE X 2): Special Requests: ADEQUATE

## 2022-07-17 ENCOUNTER — Encounter: Payer: Self-pay | Admitting: Obstetrics & Gynecology

## 2022-07-22 ENCOUNTER — Other Ambulatory Visit: Payer: Self-pay

## 2022-07-22 ENCOUNTER — Other Ambulatory Visit (HOSPITAL_COMMUNITY): Payer: Self-pay

## 2022-07-22 DIAGNOSIS — R110 Nausea: Secondary | ICD-10-CM | POA: Diagnosis not present

## 2022-07-22 MED ORDER — ONDANSETRON 4 MG PO TBDP
4.0000 mg | ORAL_TABLET | Freq: Two times a day (BID) | ORAL | 0 refills | Status: DC | PRN
  Filled 2022-07-22: qty 14, 7d supply, fill #0

## 2022-07-23 ENCOUNTER — Encounter: Payer: Self-pay | Admitting: Obstetrics & Gynecology

## 2022-07-23 ENCOUNTER — Ambulatory Visit: Payer: Commercial Managed Care - PPO | Admitting: Obstetrics & Gynecology

## 2022-07-23 VITALS — BP 127/90 | HR 86 | Ht 69.0 in | Wt 276.0 lb

## 2022-07-23 DIAGNOSIS — N7011 Chronic salpingitis: Secondary | ICD-10-CM

## 2022-07-23 DIAGNOSIS — E282 Polycystic ovarian syndrome: Secondary | ICD-10-CM

## 2022-07-23 DIAGNOSIS — E669 Obesity, unspecified: Secondary | ICD-10-CM | POA: Diagnosis not present

## 2022-07-23 DIAGNOSIS — N946 Dysmenorrhea, unspecified: Secondary | ICD-10-CM

## 2022-07-23 DIAGNOSIS — E1169 Type 2 diabetes mellitus with other specified complication: Secondary | ICD-10-CM | POA: Diagnosis not present

## 2022-07-23 DIAGNOSIS — N938 Other specified abnormal uterine and vaginal bleeding: Secondary | ICD-10-CM

## 2022-07-23 NOTE — Progress Notes (Signed)
Follow up appointment for results: DUB unresponsive to high dose megace and TXA  Chief Complaint  Patient presents with   Vaginal Bleeding    Blood pressure (!) 127/90, pulse 86, height 5\' 9"  (1.753 m), weight 276 lb (125.2 kg).   CLINICAL DATA:  Menorrhagia and bleeding since April   EXAM: TRANSABDOMINAL AND TRANSVAGINAL ULTRASOUND OF PELVIS   TECHNIQUE: Both transabdominal and transvaginal ultrasound examinations of the pelvis were performed. Transabdominal technique was performed for global imaging of the pelvis including uterus, ovaries, adnexal regions, and pelvic cul-de-sac. It was necessary to proceed with endovaginal exam following the transabdominal exam to visualize the the uterus, endometrium, and ovaries.   COMPARISON:  Pelvic ultrasound March 04, 2021, CT abdomen and pelvis contrast January 31, 2021   FINDINGS: Uterus   Measurements: 10.5 x 3.8 x 6.2 cm = volume: 128.1 mL. No fibroids or other mass visualized.   Endometrium   Thickness: 9 mm.  No focal abnormality visualized.   Right ovary   Measurements: 3.4 x 2.7 x 2.7 cm = volume: 13.0 mL. Normal appearance/no adnexal mass.   Left ovary   Measurements: 3.1 x 2.2 x 2.8 cm = volume: 10.2 mL. There is a 1.5 x 1.1 x 1.4 cm hypoechoic mass within the medial aspect of the left ovary, without internal vascularity. Benign simple cyst measuring 1.6 x 1.2 x 1.5 cm. No follow-up for this is recommended.   Other findings   No abnormal free fluid.   IMPRESSION: Left ovarian 1.5 cm hypoechoic mass is indeterminate. Differential is considerations include hemorrhagic cyst versus solid mass. Recommend repeat pelvic ultrasound in 6-12 weeks for reassessment.     Electronically Signed   By: Jacob Moores M.D.   On: 07/09/2022 14:13   MEDS ordered this encounter: No orders of the defined types were placed in this encounter.   Orders for this encounter: No orders of the defined types were placed  in this encounter.   Impression + Management Plan   ICD-10-CM   1. DUB (dysfunctional uterine bleeding)  N93.8     2. Hydrosalpinx  N70.11      I am seeing Cheryl Black in today for ongoing management of her dysfunctional uterine bleeding and menometrorrhagia that has been unresponsive to high-dose progesterone therapy as well as TXA adjunct This has been a chronic problem essentially from the time she began cycles as an adolescent She certainly has PCOS type presentation and at this point she and her husband have decided they want definitive surgical management We discussed the possibility of GnRH agonist either orally or IM which may have some short-term benefit but certainly not have a long-term fix She is nulliparous which she knows makes me uncomfortable with definitive surgical management However at this point we have short-term management solutions nothing that we will spend the next 14 years of her life which is likely how long is going to be for menopause She is diabetic as well and is at high risk for endometrial cancer given this constellation of clinical findings and symptoms so endometrial ablation I think is not a reasonable alternative She also has a hydrosalpinx which is probably contributing some degree of pelvic pressure and pain which we will remove during definitive surgical management  She is posted for robotic assisted total laparoscopic hysterectomy with bilateral salpingectomy on August 27, 2022 In the meantime she will stay on the 120 mg of megestrol I do not think doing a month of [place] will be of benefit Because of  the increased risk of thromboembolic event I do not think doing high-dose Depo will be of risk-benefit ratio benefit I would have her stop the megestrol for 7 days and then restarted at 120 mg a day and at least see if that gives [name] a reset of her endometrium Encouraged her to once again discussed this definitive management with her husband and make a joint  decision She did share that he is turning 50 next month and they are also ready to turn the page on this chapter of their life I am probably having more problem with it than she is  Follow Up: Return in about 6 weeks (around 09/05/2022) for Post Op, with Dr Despina Hidden.     All questions were answered.  Past Medical History:  Diagnosis Date   Acid reflux    Asthma    Clostridium difficile infection    in the past   Diabetes mellitus    Fatty liver    Fibrocystic breast disease    Gout    Hidradenitis    History of kidney stones    Hyperlipemia    Obesity    Polycystic ovary syndrome    PONV (postoperative nausea and vomiting)    Respiratory failure (HCC)    "2018 and 2019"   Sleep apnea    Smoker     Past Surgical History:  Procedure Laterality Date   CHOLECYSTECTOMY  01/10/2011   Procedure: LAPAROSCOPIC CHOLECYSTECTOMY;  Surgeon: Dalia Heading;  Location: [facility];  Service: General;  Laterality: N/A;   ESOPHAGOGASTRODUODENOSCOPY (EGD) WITH PROPOFOL N/A 09/09/2018   RMR: Mild erosive reflux esophagitis.  Esophagus dilated for history of dysphagia.   HYDRADENITIS EXCISION Right 06/18/2012   Procedure: EXCISION HYDRIADENITIS SUPRATIVA  RIGHT AXILLA;  Surgeon: Marlane Hatcher, MD;  Location: [facility];  Service: General;  Laterality: Right;   LIVER BIOPSY  01/10/2011   Procedure: LIVER BIOPSY;  Surgeon: Dalia Heading;  Location: [facility];  Service: General;;   MALONEY DILATION N/A 09/09/2018   Procedure: Elease Hashimoto DILATION;  Surgeon: Corbin Ade, MD;  Location: [facility];  Service: Endoscopy;  Laterality: N/A;   SKIN SPLIT GRAFT Right 06/18/2012   Procedure: SKIN GRAFT SPLIT THICKNESS RIGHT AXILLA(WITH TWO STANDARD SKIN BOARDS 3"x 8" )  DONOR SITE RIGHT AND LEFT THIGHS;  Surgeon: Marlane Hatcher, MD;  Location: [facility];  Service: General;  Laterality: Right;    OB History     Gravida  0   Para  0   Term  0   Preterm  0   AB  0   Living  0      SAB  0   IAB  0    Ectopic  0   Multiple  0   Live Births  0           Allergies  Allergen Reactions   Ace Inhibitors Anaphylaxis   Beta Adrenergic Blockers Other (See Comments)    Angioedema  Other reaction(s): Angioedema   Augmentin [Amoxicillin-Pot Clavulanate] Other (See Comments)    Has had C.diff in the past Did it involve swelling of the face/tongue/throat, SOB, or low BP? No Did it involve sudden or severe rash/hives, skin peeling, or any reaction on the inside of your mouth or nose? No Did you need to seek medical attention at a hospital or doctor's office? No When did it last happen?Unknown If all above answers are "NO", may proceed with cephalosporin use.    Benicar [Olmesartan]  Other (See Comments)    Facial edema   Doxycycline Other (See Comments)    Makes feet and hands red   Egg-Derived Products     Local reaction with injectable meds that contain eggs   Influenza Vaccine Live Swelling    Arm    Influenza Virus Vaccine Hives   Invokana [Canagliflozin] Other (See Comments)    Dehydration    Liraglutide Nausea And Vomiting    Social History   Socioeconomic History   Marital status: Married    Spouse name: Fayrene Fearing   Number of children: 0   Years of education: Not on file   Highest education level: Not on file  Occupational History   Occupation: nurse  Tobacco Use   Smoking status: Former    Current packs/day: 0.00    Average packs/day: 1 pack/day for 10.0 years (10.0 ttl pk-yrs)    Types: Cigarettes    Start date: 01/07/2008    Quit date: 01/06/2018    Years since quitting: 4.5    Passive exposure: Current   Smokeless tobacco: Never  Vaping Use   Vaping status: Never Used  Substance and Sexual Activity   Alcohol use: Not Currently    Comment: occas   Drug use: No   Sexual activity: Yes    Birth control/protection: None  Other Topics Concern   Not on file  Social History Narrative   Is a nurse here at [facility]   Social Determinants of Health   Financial  Resource Strain: Low Risk  (10/04/2020)   Overall Financial Resource Strain (CARDIA)    Difficulty of Paying Living Expenses: Not hard at all  Food Insecurity: No Food Insecurity (07/09/2022)   Hunger Vital Sign    Worried About Running Out of Food in the Last Year: Never true    Ran Out of Food in the Last Year: Never true  Transportation Needs: No Transportation Needs (07/09/2022)   PRAPARE - Administrator, Civil Service (Medical): No    Lack of Transportation (Non-Medical): No  Physical Activity: Sufficiently Active (10/04/2020)   Exercise Vital Sign    Days of Exercise per Week: 4 days    Minutes of Exercise per Session: 60 min  Stress: No Stress Concern Present (10/04/2020)   [employer] of Occupational Health - Occupational Stress Questionnaire    Feeling of Stress : Only a little  Social Connections: Socially Isolated (10/04/2020)   Social Connection and Isolation Panel [NHANES]    Frequency of Communication with Friends and Family: Once a week    Frequency of Social Gatherings with Friends and Family: Once a week    Attends Religious Services: Never    Database administrator or Organizations: No    Attends Engineer, structural: Never    Marital Status: Married    Family History  Problem Relation Age of Onset   Other Mother        pre-cancerous cells; had hyst   Emphysema Mother        smoker   COPD Mother    Diabetes Father    Hypertension Father    Cancer Sister        cervical; had hyst   Gout Brother    Hypertension Maternal Grandmother    Heart attack Maternal Grandfather    Cancer Maternal Grandfather        adenocarcinoma   Emphysema Maternal Grandfather        smoked   Hypertension Paternal Grandmother  Diabetes Paternal Grandmother    Gout Paternal Grandmother    Asthma Maternal Uncle    Stroke Other        paternal great grandfather   Colon cancer Neg Hx    Liver disease Neg Hx    Liver cancer Neg Hx

## 2022-07-24 ENCOUNTER — Encounter: Payer: Self-pay | Admitting: Obstetrics & Gynecology

## 2022-07-25 ENCOUNTER — Other Ambulatory Visit (HOSPITAL_COMMUNITY): Payer: Self-pay

## 2022-07-26 ENCOUNTER — Other Ambulatory Visit (HOSPITAL_COMMUNITY): Payer: Self-pay

## 2022-07-28 ENCOUNTER — Other Ambulatory Visit (HOSPITAL_COMMUNITY): Payer: Self-pay

## 2022-07-28 ENCOUNTER — Other Ambulatory Visit: Payer: Self-pay

## 2022-07-29 ENCOUNTER — Other Ambulatory Visit (HOSPITAL_COMMUNITY): Payer: Self-pay

## 2022-07-29 MED ORDER — METFORMIN HCL ER 500 MG PO TB24
1000.0000 mg | ORAL_TABLET | Freq: Every day | ORAL | 1 refills | Status: DC
  Filled 2022-07-29: qty 180, 90d supply, fill #0
  Filled 2022-10-20 – 2022-10-23 (×2): qty 180, 90d supply, fill #1

## 2022-08-04 ENCOUNTER — Other Ambulatory Visit (HOSPITAL_COMMUNITY): Payer: Self-pay

## 2022-08-04 ENCOUNTER — Ambulatory Visit: Payer: Commercial Managed Care - PPO | Admitting: Obstetrics & Gynecology

## 2022-08-05 ENCOUNTER — Other Ambulatory Visit: Payer: Self-pay

## 2022-08-07 ENCOUNTER — Ambulatory Visit: Payer: Commercial Managed Care - PPO | Admitting: Obstetrics & Gynecology

## 2022-08-08 DIAGNOSIS — G4733 Obstructive sleep apnea (adult) (pediatric): Secondary | ICD-10-CM | POA: Diagnosis not present

## 2022-08-12 ENCOUNTER — Encounter: Payer: Self-pay | Admitting: *Deleted

## 2022-08-22 NOTE — Patient Instructions (Signed)
Cheryl Black  08/22/2022     @PREFPERIOPPHARMACY @   Your procedure is scheduled on  08/27/2022.   Report to [facility] at  0840  A.M.   Call this number if you have problems the morning of surgery:  [phone]  If you experience any cold or flu symptoms such as cough, fever, chills, shortness of breath, etc. between now and your scheduled surgery, please notify us at the above number.   Remember:  Do not eat after midnight.    You may drink clear liquids until  0630 am on 08/27/2022.         Clear liquids allowed are:                    Water, Juice (No red color; non-citric and without pulp; diabetics please choose diet or no sugar options), Carbonated beverages (diabetics please choose diet or no sugar options), Clear Tea (No creamer, milk, or cream, including half & half and powdered creamer), Black Coffee Only (No creamer, milk or cream, including half & half and powdered creamer), Plain Jell-O Only (No red color; diabetics please choose no sugar options), Clear Sports drink (No red color; diabetics please choose diet or no sugar options), and Plain Popsicles Only (No red color; diabetics please choose no sugar options)       At 0630 am on 8/21 drink your carb drink. You can have nothing else to drink after this.    Take these medicines the morning of surgery with A SIP OF WATER           allopurinol, claritin, reglan or zofran (if needed), pantoprazole.     Do not wear jewelry, make-up or nail polish, including gel polish,  artificial nails, or any other type of covering on natural nails (fingers and  toes).  Do not wear lotions, powders, or perfumes, or deodorant.  Do not shave 48 hours prior to surgery.  Men may shave face and neck.  Do not bring valuables to the hospital.  [facility] is not responsible for any belongings or valuables.  Contacts, dentures or bridgework may not be worn into surgery.  Leave your suitcase in the car.  After surgery it  may be brought to your room.  For patients admitted to the hospital, discharge time will be determined by your treatment team.  Patients discharged the day of surgery will not be allowed to drive home and must have someone with them for 24 hours.    Special instructions:   DO NOT smoke tobacco or vape for 24 hours before your procedure.  Please read over the following fact sheets that you were given. Pain Booklet, Coughing and Deep Breathing, Surgical Site Infection Prevention, Anesthesia Post-op Instructions, and Care and Recovery After Surgery      Total Laparoscopic Hysterectomy, Care After The following information offers guidance on how to care for yourself after your procedure. Your health care provider may also give you more specific instructions. If you have problems or questions, contact your health care provider. What can I expect after the procedure? After the procedure, it is common to have: Pain, bruising, and numbness around your incisions. Tiredness (fatigue). Poor appetite. Less interest in sex. Vaginal discharge or bleeding. You will need to use a sanitary pad after this procedure. Feelings of sadness or other emotions. If your ovaries were also removed, it is also common to have symptoms of menopause, such as hot flashes,  night sweats, and lack of sleep (insomnia). Follow these instructions at home: Medicines Take over-the-counter and prescription medicines only as told by your health care provider. Ask your health care provider if the medicine prescribed to you: Requires you to avoid driving or using machinery. Can cause constipation. You may need to take these actions to prevent or treat constipation: Drink enough fluid to keep your urine pale yellow. Take over-the-counter or prescription medicines. Eat foods that are high in fiber, such as beans, whole grains, and fresh fruits and vegetables. Limit foods that are high in fat and processed sugars, such as fried  or sweet foods. Incision care  Follow instructions from your health care provider about how to take care of your incisions. Make sure you: Wash your hands with soap and water for at least 20 seconds before and after you change your bandage (dressing). If soap and water are not available, use hand sanitizer. Change your dressing as told by your health care provider. Leave stitches (sutures), skin glue, or adhesive strips in place. These skin closures may need to stay in place for 2 weeks or longer. If adhesive strip edges start to loosen and curl up, you may trim the loose edges. Do not remove adhesive strips completely unless your health care provider tells you to do that. Check your incision areas every day for signs of infection. Check for: More redness, swelling, or pain. Fluid or blood. Warmth. Pus or a bad smell. Activity  Rest as told by your health care provider. Avoid sitting for a long time without moving. Get up to take short walks every 1-2 hours. This is important to improve blood flow and breathing. Ask for help if you feel weak or unsteady. Return to your normal activities as told by your health care provider. Ask your health care provider what activities are safe for you. Do not lift anything that is heavier than 10 lb (4.5 kg), or the limit that you are told, for one month after surgery or until your health care provider says that it is safe. If you were given a sedative during the procedure, it can affect you for several hours. Do not drive or operate machinery until your health care provider says that it is safe. Lifestyle Do not use any products that contain nicotine or tobacco. These products include cigarettes, chewing tobacco, and vaping devices, such as e-cigarettes. These can delay healing after surgery. If you need help quitting, ask your health care provider. Do not drink alcohol until your health care provider approves. General instructions  Do not douche, use  tampons, or have sex for at least 6 weeks, or as told by your health care provider. If you struggle with physical or emotional changes after your procedure, speak with your health care provider or a therapist. Do not take baths, swim, or use a hot tub until your health care provider approves. You may only be allowed to take showers for 2-3 weeks. Keep your dressing dry until your health care provider says it can be removed. Try to have someone at home with you for the first 1-2 weeks to help with your daily chores. Wear compression stockings as told by your health care provider. These stockings help to prevent blood clots and reduce swelling in your legs. Keep all follow-up visits. This is important. Contact a health care provider if: You have any of these signs of infection: Chills or a fever. More redness, swelling, or pain around an incision. Fluid or blood coming  from an incision. Warmth coming from an incision. Pus or a bad smell coming from an incision. An incision opens. You feel dizzy or light-headed. You have pain or bleeding when you urinate, or you are unable to urinate. You have abnormal vaginal discharge. You have pain that does not get better with medicine. Get help right away if: You have a fever and your symptoms suddenly get worse. You have severe abdominal pain. You have chest pain or shortness of breath. You faint. You have pain, swelling, or redness in your leg. You have heavy vaginal bleeding with blood clots, soaking through a sanitary pad in less than 1 hour. These symptoms may represent a serious problem that is an emergency. Do not wait to see if the symptoms will go away. Get medical help right away. Call your local emergency services (911 in the [place] Do not drive yourself to the hospital. Summary After the procedure, it is common to have pain and bruising around your incisions. Do not take baths, swim, or use a hot tub until your health care provider  approves. Do not lift anything that is heavier than 10 lb (4.5 kg), or the limit that you are told, for one month after surgery or until your health care provider says that it is safe. Tell your health care provider if you have any signs or symptoms of infection after the procedure. Get help right away if you have severe abdominal pain, chest pain, shortness of breath, or heavy bleeding from your vagina. This information is not intended to replace advice given to you by your health care provider. Make sure you discuss any questions you have with your health care provider. Document Revised: 08/25/2019 Document Reviewed: 08/26/2019 Elsevier Patient Education  2024 [employer] General Anesthesia, Adult, Care After The following information offers guidance on how to care for yourself after your procedure. Your health care provider may also give you more specific instructions. If you have problems or questions, contact your health care provider. What can I expect after the procedure? After the procedure, it is common for people to: Have pain or discomfort at the IV site. Have nausea or vomiting. Have a sore throat or hoarseness. Have trouble concentrating. Feel cold or chills. Feel weak, sleepy, or tired (fatigue). Have soreness and body aches. These can affect parts of the body that were not involved in surgery. Follow these instructions at home: For the time period you were told by your health care provider:  Rest. Do not participate in activities where you could fall or become injured. Do not drive or use machinery. Do not drink alcohol. Do not take sleeping pills or medicines that cause drowsiness. Do not make important decisions or sign legal documents. Do not take care of children on your own. General instructions Drink enough fluid to keep your urine pale yellow. If you have sleep apnea, surgery and certain medicines can increase your risk for breathing problems. Follow  instructions from your health care provider about wearing your sleep device: Anytime you are sleeping, including during daytime naps. While taking prescription pain medicines, sleeping medicines, or medicines that make you drowsy. Return to your normal activities as told by your health care provider. Ask your health care provider what activities are safe for you. Take over-the-counter and prescription medicines only as told by your health care provider. Do not use any products that contain nicotine or tobacco. These products include cigarettes, chewing tobacco, and vaping devices, such as e-cigarettes. These can delay incision healing  after surgery. If you need help quitting, ask your health care provider. Contact a health care provider if: You have nausea or vomiting that does not get better with medicine. You vomit every time you eat or drink. You have pain that does not get better with medicine. You cannot urinate or have bloody urine. You develop a skin rash. You have a fever. Get help right away if: You have trouble breathing. You have chest pain. You vomit blood. These symptoms may be an emergency. Get help right away. Call 911. Do not wait to see if the symptoms will go away. Do not drive yourself to the hospital. Summary After the procedure, it is common to have a sore throat, hoarseness, nausea, vomiting, or to feel weak, sleepy, or fatigue. For the time period you were told by your health care provider, do not drive or use machinery. Get help right away if you have difficulty breathing, have chest pain, or vomit blood. These symptoms may be an emergency. This information is not intended to replace advice given to you by your health care provider. Make sure you discuss any questions you have with your health care provider. Document Revised: 03/22/2021 Document Reviewed: 03/22/2021 Elsevier Patient Education  2024 [employer] How to Use Chlorhexidine Before  Surgery Chlorhexidine gluconate (CHG) is a germ-killing (antiseptic) solution that is used to clean the skin. It can get rid of the bacteria that normally live on the skin and can keep them away for about 24 hours. To clean your skin with CHG, you may be given: A CHG solution to use in the shower or as part of a sponge bath. A prepackaged cloth that contains CHG. Cleaning your skin with CHG may help lower the risk for infection: While you are staying in the intensive care unit of the hospital. If you have a vascular access, such as a central line, to provide short-term or long-term access to your veins. If you have a catheter to drain urine from your bladder. If you are on a ventilator. A ventilator is a machine that helps you breathe by moving air in and out of your lungs. After surgery. What are the risks? Risks of using CHG include: A skin reaction. Hearing loss, if CHG gets in your ears and you have a perforated eardrum. Eye injury, if CHG gets in your eyes and is not rinsed out. The CHG product catching fire. Make sure that you avoid smoking and flames after applying CHG to your skin. Do not use CHG: If you have a chlorhexidine allergy or have previously reacted to chlorhexidine. On babies younger than 68 months of age. How to use CHG solution Use CHG only as told by your health care provider, and follow the instructions on the label. Use the full amount of CHG as directed. Usually, this is one bottle. During a shower Follow these steps when using CHG solution during a shower (unless your health care provider gives you different instructions): Start the shower. Use your normal soap and shampoo to wash your face and hair. Turn off the shower or move out of the shower stream. Pour the CHG onto a clean washcloth. Do not use any type of brush or rough-edged sponge. Starting at your neck, lather your body down to your toes. Make sure you follow these instructions: If you will be having  surgery, pay special attention to the part of your body where you will be having surgery. Scrub this area for at least 1 minute. Do not  use CHG on your head or face. If the solution gets into your ears or eyes, rinse them well with water. Avoid your genital area. Avoid any areas of skin that have broken skin, cuts, or scrapes. Scrub your back and under your arms. Make sure to wash skin folds. Let the lather sit on your skin for 1-2 minutes or as long as told by your health care provider. Thoroughly rinse your entire body in the shower. Make sure that all body creases and crevices are rinsed well. Dry off with a clean towel. Do not put any substances on your body afterward--such as powder, lotion, or perfume--unless you are told to do so by your health care provider. Only use lotions that are recommended by the manufacturer. Put on clean clothes or pajamas. If it is the night before your surgery, sleep in clean sheets.  During a sponge bath Follow these steps when using CHG solution during a sponge bath (unless your health care provider gives you different instructions): Use your normal soap and shampoo to wash your face and hair. Pour the CHG onto a clean washcloth. Starting at your neck, lather your body down to your toes. Make sure you follow these instructions: If you will be having surgery, pay special attention to the part of your body where you will be having surgery. Scrub this area for at least 1 minute. Do not use CHG on your head or face. If the solution gets into your ears or eyes, rinse them well with water. Avoid your genital area. Avoid any areas of skin that have broken skin, cuts, or scrapes. Scrub your back and under your arms. Make sure to wash skin folds. Let the lather sit on your skin for 1-2 minutes or as long as told by your health care provider. Using a different clean, wet washcloth, thoroughly rinse your entire body. Make sure that all body creases and crevices are  rinsed well. Dry off with a clean towel. Do not put any substances on your body afterward--such as powder, lotion, or perfume--unless you are told to do so by your health care provider. Only use lotions that are recommended by the manufacturer. Put on clean clothes or pajamas. If it is the night before your surgery, sleep in clean sheets. How to use CHG prepackaged cloths Only use CHG cloths as told by your health care provider, and follow the instructions on the label. Use the CHG cloth on clean, dry skin. Do not use the CHG cloth on your head or face unless your health care provider tells you to. When washing with the CHG cloth: Avoid your genital area. Avoid any areas of skin that have broken skin, cuts, or scrapes. Before surgery Follow these steps when using a CHG cloth to clean before surgery (unless your health care provider gives you different instructions): Using the CHG cloth, vigorously scrub the part of your body where you will be having surgery. Scrub using a back-and-forth motion for 3 minutes. The area on your body should be completely wet with CHG when you are done scrubbing. Do not rinse. Discard the cloth and let the area air-dry. Do not put any substances on the area afterward, such as powder, lotion, or perfume. Put on clean clothes or pajamas. If it is the night before your surgery, sleep in clean sheets.  For general bathing Follow these steps when using CHG cloths for general bathing (unless your health care provider gives you different instructions). Use a separate CHG cloth for  each area of your body. Make sure you wash between any folds of skin and between your fingers and toes. Wash your body in the following order, switching to a new cloth after each step: The front of your neck, shoulders, and chest. Both of your arms, under your arms, and your hands. Your stomach and groin area, avoiding the genitals. Your right leg and foot. Your left leg and foot. The back of  your neck, your back, and your buttocks. Do not rinse. Discard the cloth and let the area air-dry. Do not put any substances on your body afterward--such as powder, lotion, or perfume--unless you are told to do so by your health care provider. Only use lotions that are recommended by the manufacturer. Put on clean clothes or pajamas. Contact a health care provider if: Your skin gets irritated after scrubbing. You have questions about using your solution or cloth. You swallow any chlorhexidine. Call your local poison control center ([phone] in the [place] Get help right away if: Your eyes itch badly, or they become very red or swollen. Your skin itches badly and is red or swollen. Your hearing changes. You have trouble seeing. You have swelling or tingling in your mouth or throat. You have trouble breathing. These symptoms may represent a serious problem that is an emergency. Do not wait to see if the symptoms will go away. Get medical help right away. Call your local emergency services (911 in the [place] Do not drive yourself to the hospital. Summary Chlorhexidine gluconate (CHG) is a germ-killing (antiseptic) solution that is used to clean the skin. Cleaning your skin with CHG may help to lower your risk for infection. You may be given CHG to use for bathing. It may be in a bottle or in a prepackaged cloth to use on your skin. Carefully follow your health care provider's instructions and the instructions on the product label. Do not use CHG if you have a chlorhexidine allergy. Contact your health care provider if your skin gets irritated after scrubbing. This information is not intended to replace advice given to you by your health care provider. Make sure you discuss any questions you have with your health care provider. Document Revised: 04/22/2021 Document Reviewed: 03/05/2020 Elsevier Patient Education  2023 [employer].

## 2022-08-23 ENCOUNTER — Telehealth: Payer: Commercial Managed Care - PPO | Admitting: Nurse Practitioner

## 2022-08-23 DIAGNOSIS — J069 Acute upper respiratory infection, unspecified: Secondary | ICD-10-CM | POA: Diagnosis not present

## 2022-08-23 MED ORDER — FLUTICASONE PROPIONATE 50 MCG/ACT NA SUSP: 2.0000 | Freq: Every day | NASAL | 6 refills | Status: DC

## 2022-08-23 MED ORDER — BENZONATATE 100 MG PO CAPS: 100.0000 mg | ORAL_CAPSULE | Freq: Three times a day (TID) | ORAL | 0 refills | Status: DC | PRN

## 2022-08-23 NOTE — Progress Notes (Signed)

## 2022-08-24 ENCOUNTER — Other Ambulatory Visit: Payer: Self-pay | Admitting: Obstetrics & Gynecology

## 2022-08-24 DIAGNOSIS — Z01818 Encounter for other preprocedural examination: Secondary | ICD-10-CM

## 2022-08-25 ENCOUNTER — Other Ambulatory Visit (HOSPITAL_COMMUNITY): Payer: Self-pay

## 2022-08-25 ENCOUNTER — Encounter: Payer: Self-pay | Admitting: Obstetrics & Gynecology

## 2022-08-25 ENCOUNTER — Other Ambulatory Visit: Payer: Self-pay | Admitting: Obstetrics & Gynecology

## 2022-08-25 ENCOUNTER — Encounter (HOSPITAL_COMMUNITY): Admission: RE | Admit: 2022-08-25 | Payer: Commercial Managed Care - PPO | Source: Ambulatory Visit

## 2022-08-25 DIAGNOSIS — Z01812 Encounter for preprocedural laboratory examination: Secondary | ICD-10-CM | POA: Insufficient documentation

## 2022-08-25 DIAGNOSIS — Z01818 Encounter for other preprocedural examination: Secondary | ICD-10-CM | POA: Diagnosis present

## 2022-08-25 LAB — COMPREHENSIVE METABOLIC PANEL
ALT: 39 U/L (ref 0–44)
AST: 38 U/L (ref 15–41)
Albumin: 3.9 g/dL (ref 3.5–5.0)
Alkaline Phosphatase: 58 U/L (ref 38–126)
Anion gap: 14 (ref 5–15)
BUN: 10 mg/dL (ref 6–20)
CO2: 21 mmol/L — ABNORMAL LOW (ref 22–32)
Calcium: 8.9 mg/dL (ref 8.9–10.3)
Chloride: 100 mmol/L (ref 98–111)
Creatinine, Ser: 0.69 mg/dL (ref 0.44–1.00)
GFR, Estimated: >60 mL/min (ref >60)
Glucose, Bld: 175 mg/dL — ABNORMAL HIGH (ref 70–99)
Potassium: 2.7 mmol/L — CL (ref 3.5–5.1)
Sodium: 135 mmol/L (ref 135–145)
Total Bilirubin: 0.6 mg/dL (ref 0.3–1.2)
Total Protein: 7.6 g/dL (ref 6.5–8.1)

## 2022-08-25 LAB — CBC
HCT: 46.9 % — ABNORMAL HIGH (ref 36.0–46.0)
Hemoglobin: 16.2 g/dL — ABNORMAL HIGH (ref 12.0–15.0)
MCH: 30.6 pg (ref 26.0–34.0)
MCHC: 34.5 g/dL (ref 30.0–36.0)
MCV: 88.7 fL (ref 80.0–100.0)
Platelets: 302 10*3/uL (ref 150–400)
RBC: 5.29 MIL/uL — ABNORMAL HIGH (ref 3.87–5.11)
RDW: 12.8 % (ref 11.5–15.5)
WBC: 6 10*3/uL (ref 4.0–10.5)
nRBC: 0 % (ref 0.0–0.2)

## 2022-08-25 LAB — URINALYSIS, ROUTINE W REFLEX MICROSCOPIC
Bacteria, UA: NONE SEEN
Bilirubin Urine: NEGATIVE
Glucose, UA: >=500 mg/dL — AB
Ketones, ur: NEGATIVE mg/dL
Leukocytes,Ua: NEGATIVE
Nitrite: NEGATIVE
Protein, ur: NEGATIVE mg/dL
Specific Gravity, Urine: 1.027 (ref 1.005–1.030)
pH: 5 (ref 5.0–8.0)

## 2022-08-25 LAB — RAPID HIV SCREEN (HIV 1/2 AB+AG)
HIV 1/2 Antibodies: NONREACTIVE
HIV-1 P24 Antigen - HIV24: NONREACTIVE

## 2022-08-25 LAB — PREGNANCY, URINE: Preg Test, Ur: NEGATIVE

## 2022-08-25 MED ORDER — POTASSIUM CHLORIDE CRYS ER 20 MEQ PO TBCR
20.0000 meq | EXTENDED_RELEASE_TABLET | Freq: Two times a day (BID) | ORAL | 1 refills | Status: DC
  Filled 2022-08-25: qty 60, 30d supply, fill #0

## 2022-08-25 NOTE — Progress Notes (Signed)
Potassium 2.7 today. Dr Despina Hidden notified.

## 2022-08-26 ENCOUNTER — Other Ambulatory Visit: Payer: Self-pay

## 2022-08-27 ENCOUNTER — Ambulatory Visit (HOSPITAL_COMMUNITY)
Admission: RE | Admit: 2022-08-27 | Payer: Commercial Managed Care - PPO | Source: Home / Self Care | Admitting: Obstetrics & Gynecology

## 2022-08-27 ENCOUNTER — Encounter (HOSPITAL_COMMUNITY): Admission: RE | Payer: Self-pay | Source: Home / Self Care

## 2022-08-27 SURGERY — XI ROBOTIC ASSISTED LAPAROSCOPIC HYSTERECTOMY AND SALPINGECTOMY
Anesthesia: General | Laterality: Bilateral

## 2022-08-28 ENCOUNTER — Other Ambulatory Visit: Payer: Self-pay

## 2022-09-02 ENCOUNTER — Encounter: Payer: Commercial Managed Care - PPO | Admitting: Obstetrics & Gynecology

## 2022-09-02 NOTE — Patient Instructions (Signed)
Cheryl Black  09/02/2022     @PREFPERIOPPHARMACY @   Your procedure is scheduled on  09/05/2022.   Report to [facility] at  1015  A.M.   Call this number if you have problems the morning of surgery:  [phone]  If you experience any cold or flu symptoms such as cough, fever, chills, shortness of breath, etc. between now and your scheduled surgery, please notify us at the above number.   Remember:  Do not eat after midnight.   You may drink clear liquids until 0815 am on 09/05/2022.        Clear liquids allowed are:                    Water, Juice (No red color; non-citric and without pulp; diabetics please choose diet or no sugar options), Carbonated beverages (diabetics please choose diet or no sugar options), Clear Tea (No creamer, milk, or cream, including half & half and powdered creamer), Black Coffee Only (No creamer, milk or cream, including half & half and powdered creamer), Plain Jell-O Only (No red color; diabetics please choose no sugar options), Clear Sports drink (No red color; diabetics please choose diet or no sugar options), and Plain Popsicles Only (No red color; diabetics please choose no sugar options)       At 0815 am on 09/05/2022 drink your carb drink. You can have nothing else after this.    Take these medicines the morning of surgery with A SIP OF WATER                          allopurinol, reglan, zofran(if needed).     Do not wear jewelry, make-up or nail polish, including gel polish,  artificial nails, or any other type of covering on natural nails (fingers and  toes).  Do not wear lotions, powders, or perfumes, or deodorant.  Do not shave 48 hours prior to surgery.  Men may shave face and neck.  Do not bring valuables to the hospital.  [facility] is not responsible for any belongings or valuables.  Contacts, dentures or bridgework may not be worn into surgery.  Leave your suitcase in the car.  After surgery it may be brought to your  room.  For patients admitted to the hospital, discharge time will be determined by your treatment team.  Patients discharged the day of surgery will not be allowed to drive home and must have someone with them for 24 hours.    Special instructions:   DO NOT smoke tobacco or vape for 24 hors before your procedure.  Please read over the following fact sheets that you were given. Pain Booklet, Coughing and Deep Breathing, Surgical Site Infection Prevention, Anesthesia Post-op Instructions, and Care and Recovery After Surgery       Total Laparoscopic Hysterectomy, Care After The following information offers guidance on how to care for yourself after your procedure. Your health care provider may also give you more specific instructions. If you have problems or questions, contact your health care provider. What can I expect after the procedure? After the procedure, it is common to have: Pain, bruising, and numbness around your incisions. Tiredness (fatigue). Poor appetite. Less interest in sex. Vaginal discharge or bleeding. You will need to use a sanitary pad after this procedure. Feelings of sadness or other emotions. If your ovaries were also removed, it is also common to have symptoms  of menopause, such as hot flashes, night sweats, and lack of sleep (insomnia). Follow these instructions at home: Medicines Take over-the-counter and prescription medicines only as told by your health care provider. Ask your health care provider if the medicine prescribed to you: Requires you to avoid driving or using machinery. Can cause constipation. You may need to take these actions to prevent or treat constipation: Drink enough fluid to keep your urine pale yellow. Take over-the-counter or prescription medicines. Eat foods that are high in fiber, such as beans, whole grains, and fresh fruits and vegetables. Limit foods that are high in fat and processed sugars, such as fried or sweet  foods. Incision care  Follow instructions from your health care provider about how to take care of your incisions. Make sure you: Wash your hands with soap and water for at least 20 seconds before and after you change your bandage (dressing). If soap and water are not available, use hand sanitizer. Change your dressing as told by your health care provider. Leave stitches (sutures), skin glue, or adhesive strips in place. These skin closures may need to stay in place for 2 weeks or longer. If adhesive strip edges start to loosen and curl up, you may trim the loose edges. Do not remove adhesive strips completely unless your health care provider tells you to do that. Check your incision areas every day for signs of infection. Check for: More redness, swelling, or pain. Fluid or blood. Warmth. Pus or a bad smell. Activity  Rest as told by your health care provider. Avoid sitting for a long time without moving. Get up to take short walks every 1-2 hours. This is important to improve blood flow and breathing. Ask for help if you feel weak or unsteady. Return to your normal activities as told by your health care provider. Ask your health care provider what activities are safe for you. Do not lift anything that is heavier than 10 lb (4.5 kg), or the limit that you are told, for one month after surgery or until your health care provider says that it is safe. If you were given a sedative during the procedure, it can affect you for several hours. Do not drive or operate machinery until your health care provider says that it is safe. Lifestyle Do not use any products that contain nicotine or tobacco. These products include cigarettes, chewing tobacco, and vaping devices, such as e-cigarettes. These can delay healing after surgery. If you need help quitting, ask your health care provider. Do not drink alcohol until your health care provider approves. General instructions  Do not douche, use tampons, or  have sex for at least 6 weeks, or as told by your health care provider. If you struggle with physical or emotional changes after your procedure, speak with your health care provider or a therapist. Do not take baths, swim, or use a hot tub until your health care provider approves. You may only be allowed to take showers for 2-3 weeks. Keep your dressing dry until your health care provider says it can be removed. Try to have someone at home with you for the first 1-2 weeks to help with your daily chores. Wear compression stockings as told by your health care provider. These stockings help to prevent blood clots and reduce swelling in your legs. Keep all follow-up visits. This is important. Contact a health care provider if: You have any of these signs of infection: Chills or a fever. More redness, swelling, or pain around  an incision. Fluid or blood coming from an incision. Warmth coming from an incision. Pus or a bad smell coming from an incision. An incision opens. You feel dizzy or light-headed. You have pain or bleeding when you urinate, or you are unable to urinate. You have abnormal vaginal discharge. You have pain that does not get better with medicine. Get help right away if: You have a fever and your symptoms suddenly get worse. You have severe abdominal pain. You have chest pain or shortness of breath. You faint. You have pain, swelling, or redness in your leg. You have heavy vaginal bleeding with blood clots, soaking through a sanitary pad in less than 1 hour. These symptoms may represent a serious problem that is an emergency. Do not wait to see if the symptoms will go away. Get medical help right away. Call your local emergency services (911 in the [place] Do not drive yourself to the hospital. Summary After the procedure, it is common to have pain and bruising around your incisions. Do not take baths, swim, or use a hot tub until your health care provider approves. Do not  lift anything that is heavier than 10 lb (4.5 kg), or the limit that you are told, for one month after surgery or until your health care provider says that it is safe. Tell your health care provider if you have any signs or symptoms of infection after the procedure. Get help right away if you have severe abdominal pain, chest pain, shortness of breath, or heavy bleeding from your vagina. This information is not intended to replace advice given to you by your health care provider. Make sure you discuss any questions you have with your health care provider. Document Revised: 08/25/2019 Document Reviewed: 08/26/2019 Elsevier Patient Education  2024 [employer] General Anesthesia, Adult, Care After The following information offers guidance on how to care for yourself after your procedure. Your health care provider may also give you more specific instructions. If you have problems or questions, contact your health care provider. What can I expect after the procedure? After the procedure, it is common for people to: Have pain or discomfort at the IV site. Have nausea or vomiting. Have a sore throat or hoarseness. Have trouble concentrating. Feel cold or chills. Feel weak, sleepy, or tired (fatigue). Have soreness and body aches. These can affect parts of the body that were not involved in surgery. Follow these instructions at home: For the time period you were told by your health care provider:  Rest. Do not participate in activities where you could fall or become injured. Do not drive or use machinery. Do not drink alcohol. Do not take sleeping pills or medicines that cause drowsiness. Do not make important decisions or sign legal documents. Do not take care of children on your own. General instructions Drink enough fluid to keep your urine pale yellow. If you have sleep apnea, surgery and certain medicines can increase your risk for breathing problems. Follow instructions from your health  care provider about wearing your sleep device: Anytime you are sleeping, including during daytime naps. While taking prescription pain medicines, sleeping medicines, or medicines that make you drowsy. Return to your normal activities as told by your health care provider. Ask your health care provider what activities are safe for you. Take over-the-counter and prescription medicines only as told by your health care provider. Do not use any products that contain nicotine or tobacco. These products include cigarettes, chewing tobacco, and vaping devices, such as  e-cigarettes. These can delay incision healing after surgery. If you need help quitting, ask your health care provider. Contact a health care provider if: You have nausea or vomiting that does not get better with medicine. You vomit every time you eat or drink. You have pain that does not get better with medicine. You cannot urinate or have bloody urine. You develop a skin rash. You have a fever. Get help right away if: You have trouble breathing. You have chest pain. You vomit blood. These symptoms may be an emergency. Get help right away. Call 911. Do not wait to see if the symptoms will go away. Do not drive yourself to the hospital. Summary After the procedure, it is common to have a sore throat, hoarseness, nausea, vomiting, or to feel weak, sleepy, or fatigue. For the time period you were told by your health care provider, do not drive or use machinery. Get help right away if you have difficulty breathing, have chest pain, or vomit blood. These symptoms may be an emergency. This information is not intended to replace advice given to you by your health care provider. Make sure you discuss any questions you have with your health care provider. Document Revised: 03/22/2021 Document Reviewed: 03/22/2021 Elsevier Patient Education  2024 [employer] How to Use Chlorhexidine Before Surgery Chlorhexidine gluconate (CHG) is a  germ-killing (antiseptic) solution that is used to clean the skin. It can get rid of the bacteria that normally live on the skin and can keep them away for about 24 hours. To clean your skin with CHG, you may be given: A CHG solution to use in the shower or as part of a sponge bath. A prepackaged cloth that contains CHG. Cleaning your skin with CHG may help lower the risk for infection: While you are staying in the intensive care unit of the hospital. If you have a vascular access, such as a central line, to provide short-term or long-term access to your veins. If you have a catheter to drain urine from your bladder. If you are on a ventilator. A ventilator is a machine that helps you breathe by moving air in and out of your lungs. After surgery. What are the risks? Risks of using CHG include: A skin reaction. Hearing loss, if CHG gets in your ears and you have a perforated eardrum. Eye injury, if CHG gets in your eyes and is not rinsed out. The CHG product catching fire. Make sure that you avoid smoking and flames after applying CHG to your skin. Do not use CHG: If you have a chlorhexidine allergy or have previously reacted to chlorhexidine. On babies younger than 78 months of age. How to use CHG solution Use CHG only as told by your health care provider, and follow the instructions on the label. Use the full amount of CHG as directed. Usually, this is one bottle. During a shower Follow these steps when using CHG solution during a shower (unless your health care provider gives you different instructions): Start the shower. Use your normal soap and shampoo to wash your face and hair. Turn off the shower or move out of the shower stream. Pour the CHG onto a clean washcloth. Do not use any type of brush or rough-edged sponge. Starting at your neck, lather your body down to your toes. Make sure you follow these instructions: If you will be having surgery, pay special attention to the part of  your body where you will be having surgery. Scrub this area for  at least 1 minute. Do not use CHG on your head or face. If the solution gets into your ears or eyes, rinse them well with water. Avoid your genital area. Avoid any areas of skin that have broken skin, cuts, or scrapes. Scrub your back and under your arms. Make sure to wash skin folds. Let the lather sit on your skin for 1-2 minutes or as long as told by your health care provider. Thoroughly rinse your entire body in the shower. Make sure that all body creases and crevices are rinsed well. Dry off with a clean towel. Do not put any substances on your body afterward--such as powder, lotion, or perfume--unless you are told to do so by your health care provider. Only use lotions that are recommended by the manufacturer. Put on clean clothes or pajamas. If it is the night before your surgery, sleep in clean sheets.  During a sponge bath Follow these steps when using CHG solution during a sponge bath (unless your health care provider gives you different instructions): Use your normal soap and shampoo to wash your face and hair. Pour the CHG onto a clean washcloth. Starting at your neck, lather your body down to your toes. Make sure you follow these instructions: If you will be having surgery, pay special attention to the part of your body where you will be having surgery. Scrub this area for at least 1 minute. Do not use CHG on your head or face. If the solution gets into your ears or eyes, rinse them well with water. Avoid your genital area. Avoid any areas of skin that have broken skin, cuts, or scrapes. Scrub your back and under your arms. Make sure to wash skin folds. Let the lather sit on your skin for 1-2 minutes or as long as told by your health care provider. Using a different clean, wet washcloth, thoroughly rinse your entire body. Make sure that all body creases and crevices are rinsed well. Dry off with a clean towel. Do not  put any substances on your body afterward--such as powder, lotion, or perfume--unless you are told to do so by your health care provider. Only use lotions that are recommended by the manufacturer. Put on clean clothes or pajamas. If it is the night before your surgery, sleep in clean sheets. How to use CHG prepackaged cloths Only use CHG cloths as told by your health care provider, and follow the instructions on the label. Use the CHG cloth on clean, dry skin. Do not use the CHG cloth on your head or face unless your health care provider tells you to. When washing with the CHG cloth: Avoid your genital area. Avoid any areas of skin that have broken skin, cuts, or scrapes. Before surgery Follow these steps when using a CHG cloth to clean before surgery (unless your health care provider gives you different instructions): Using the CHG cloth, vigorously scrub the part of your body where you will be having surgery. Scrub using a back-and-forth motion for 3 minutes. The area on your body should be completely wet with CHG when you are done scrubbing. Do not rinse. Discard the cloth and let the area air-dry. Do not put any substances on the area afterward, such as powder, lotion, or perfume. Put on clean clothes or pajamas. If it is the night before your surgery, sleep in clean sheets.  For general bathing Follow these steps when using CHG cloths for general bathing (unless your health care provider gives you different instructions).  Use a separate CHG cloth for each area of your body. Make sure you wash between any folds of skin and between your fingers and toes. Wash your body in the following order, switching to a new cloth after each step: The front of your neck, shoulders, and chest. Both of your arms, under your arms, and your hands. Your stomach and groin area, avoiding the genitals. Your right leg and foot. Your left leg and foot. The back of your neck, your back, and your buttocks. Do not  rinse. Discard the cloth and let the area air-dry. Do not put any substances on your body afterward--such as powder, lotion, or perfume--unless you are told to do so by your health care provider. Only use lotions that are recommended by the manufacturer. Put on clean clothes or pajamas. Contact a health care provider if: Your skin gets irritated after scrubbing. You have questions about using your solution or cloth. You swallow any chlorhexidine. Call your local poison control center ((719) [phone] in the [place] Get help right away if: Your eyes itch badly, or they become very red or swollen. Your skin itches badly and is red or swollen. Your hearing changes. You have trouble seeing. You have swelling or tingling in your mouth or throat. You have trouble breathing. These symptoms may represent a serious problem that is an emergency. Do not wait to see if the symptoms will go away. Get medical help right away. Call your local emergency services (911 in the [place] Do not drive yourself to the hospital. Summary Chlorhexidine gluconate (CHG) is a germ-killing (antiseptic) solution that is used to clean the skin. Cleaning your skin with CHG may help to lower your risk for infection. You may be given CHG to use for bathing. It may be in a bottle or in a prepackaged cloth to use on your skin. Carefully follow your health care provider's instructions and the instructions on the product label. Do not use CHG if you have a chlorhexidine allergy. Contact your health care provider if your skin gets irritated after scrubbing. This information is not intended to replace advice given to you by your health care provider. Make sure you discuss any questions you have with your health care provider. Document Revised: 04/22/2021 Document Reviewed: 03/05/2020 Elsevier Patient Education  2023 [employer].

## 2022-09-03 ENCOUNTER — Encounter (HOSPITAL_COMMUNITY)
Admission: RE | Admit: 2022-09-03 | Discharge: 2022-09-03 | Disposition: A | Discharge status: Home / Self Care | Payer: Commercial Managed Care - PPO | Source: Ambulatory Visit | Attending: Obstetrics & Gynecology | Admitting: Obstetrics & Gynecology

## 2022-09-03 ENCOUNTER — Encounter (HOSPITAL_COMMUNITY): Payer: Self-pay

## 2022-09-03 VITALS — BP 127/90 | HR 88 | Resp 18 | Ht 69.0 in | Wt 276.0 lb

## 2022-09-03 DIAGNOSIS — Z01818 Encounter for other preprocedural examination: Secondary | ICD-10-CM | POA: Diagnosis present

## 2022-09-03 DIAGNOSIS — E1169 Type 2 diabetes mellitus with other specified complication: Secondary | ICD-10-CM | POA: Diagnosis not present

## 2022-09-03 DIAGNOSIS — E876 Hypokalemia: Secondary | ICD-10-CM | POA: Insufficient documentation

## 2022-09-03 DIAGNOSIS — E785 Hyperlipidemia, unspecified: Secondary | ICD-10-CM | POA: Diagnosis not present

## 2022-09-03 LAB — BASIC METABOLIC PANEL
Anion gap: 8 (ref 5–15)
BUN: 8 mg/dL (ref 6–20)
CO2: 21 mmol/L — ABNORMAL LOW (ref 22–32)
Calcium: 8.7 mg/dL — ABNORMAL LOW (ref 8.9–10.3)
Chloride: 103 mmol/L (ref 98–111)
Creatinine, Ser: 0.61 mg/dL (ref 0.44–1.00)
GFR, Estimated: >60 mL/min (ref >60)
Glucose, Bld: 288 mg/dL — ABNORMAL HIGH (ref 70–99)
Potassium: 3.9 mmol/L (ref 3.5–5.1)
Sodium: 132 mmol/L — ABNORMAL LOW (ref 135–145)

## 2022-09-03 LAB — HEMOGLOBIN A1C
Hgb A1c MFr Bld: 7.6 % — ABNORMAL HIGH (ref 4.8–5.6)
Mean Plasma Glucose: 171.42 mg/dL

## 2022-09-03 LAB — POCT PREGNANCY, URINE: Preg Test, Ur: NEGATIVE

## 2022-09-04 MED ORDER — CLINDAMYCIN PHOSPHATE 900 MG/50ML IV SOLN
900.0000 mg | INTRAVENOUS | Status: AC
  Administered 2022-09-05: 900 mg via INTRAVENOUS
  Filled 2022-09-04: qty 50

## 2022-09-04 MED ORDER — GENTAMICIN SULFATE 40 MG/ML IJ SOLN
5.0000 mg/kg | INTRAVENOUS | Status: AC
  Administered 2022-09-05: 626 mg via INTRAVENOUS
  Filled 2022-09-04 (×2): qty 15.75

## 2022-09-04 MED ORDER — POVIDONE-IODINE 10 % EX SWAB
2.0000 | Freq: Once | CUTANEOUS | Status: AC
  Administered 2022-09-05: 2 via TOPICAL

## 2022-09-05 ENCOUNTER — Encounter (HOSPITAL_COMMUNITY): Payer: Self-pay | Admitting: Obstetrics & Gynecology

## 2022-09-05 ENCOUNTER — Ambulatory Visit (HOSPITAL_COMMUNITY)
Admission: RE | Admit: 2022-09-05 | Discharge: 2022-09-05 | Disposition: A | Discharge status: Home / Self Care | Payer: Commercial Managed Care - PPO | Attending: Obstetrics & Gynecology | Admitting: Obstetrics & Gynecology

## 2022-09-05 ENCOUNTER — Ambulatory Visit (HOSPITAL_COMMUNITY): Payer: Commercial Managed Care - PPO | Admitting: Certified Registered"

## 2022-09-05 ENCOUNTER — Ambulatory Visit (HOSPITAL_BASED_OUTPATIENT_CLINIC_OR_DEPARTMENT_OTHER): Payer: Commercial Managed Care - PPO | Admitting: Certified Registered"

## 2022-09-05 ENCOUNTER — Encounter (HOSPITAL_COMMUNITY)
Admission: RE | Disposition: A | Discharge status: Home / Self Care | Payer: Self-pay | Source: Home / Self Care | Attending: Obstetrics & Gynecology

## 2022-09-05 DIAGNOSIS — J45909 Unspecified asthma, uncomplicated: Secondary | ICD-10-CM | POA: Diagnosis not present

## 2022-09-05 DIAGNOSIS — E119 Type 2 diabetes mellitus without complications: Secondary | ICD-10-CM | POA: Diagnosis not present

## 2022-09-05 DIAGNOSIS — N838 Other noninflammatory disorders of ovary, fallopian tube and broad ligament: Secondary | ICD-10-CM | POA: Diagnosis not present

## 2022-09-05 DIAGNOSIS — N7011 Chronic salpingitis: Secondary | ICD-10-CM | POA: Diagnosis not present

## 2022-09-05 DIAGNOSIS — N946 Dysmenorrhea, unspecified: Secondary | ICD-10-CM | POA: Diagnosis not present

## 2022-09-05 DIAGNOSIS — I10 Essential (primary) hypertension: Secondary | ICD-10-CM | POA: Diagnosis not present

## 2022-09-05 DIAGNOSIS — Z87891 Personal history of nicotine dependence: Secondary | ICD-10-CM | POA: Diagnosis not present

## 2022-09-05 DIAGNOSIS — G4730 Sleep apnea, unspecified: Secondary | ICD-10-CM | POA: Diagnosis not present

## 2022-09-05 DIAGNOSIS — N921 Excessive and frequent menstruation with irregular cycle: Secondary | ICD-10-CM | POA: Insufficient documentation

## 2022-09-05 DIAGNOSIS — Z7722 Contact with and (suspected) exposure to environmental tobacco smoke (acute) (chronic): Secondary | ICD-10-CM | POA: Insufficient documentation

## 2022-09-05 DIAGNOSIS — D282 Benign neoplasm of uterine tubes and ligaments: Secondary | ICD-10-CM | POA: Diagnosis not present

## 2022-09-05 HISTORY — PX: ROBOTIC ASSISTED TOTAL HYSTERECTOMY WITH BILATERAL SALPINGO OOPHERECTOMY: SHX6086

## 2022-09-05 LAB — GLUCOSE, CAPILLARY
Glucose-Capillary: 271 mg/dL — ABNORMAL HIGH (ref 70–99)
Glucose-Capillary: 253 mg/dL — ABNORMAL HIGH (ref 70–99)
Glucose-Capillary: 177 mg/dL — ABNORMAL HIGH (ref 70–99)

## 2022-09-05 SURGERY — HYSTERECTOMY, TOTAL, ROBOT-ASSISTED, LAPAROSCOPIC, WITH BILATERAL SALPINGO-OOPHORECTOMY
Anesthesia: General | Laterality: Bilateral

## 2022-09-05 MED ORDER — FENTANYL CITRATE (PF) 100 MCG/2ML IJ SOLN
INTRAMUSCULAR | Status: DC | PRN
  Administered 2022-09-05 (×2): 100 ug via INTRAVENOUS
  Administered 2022-09-05: 50 ug via INTRAVENOUS

## 2022-09-05 MED ORDER — SCOPOLAMINE 1 MG/3DAYS TD PT72
MEDICATED_PATCH | TRANSDERMAL | Status: AC
  Filled 2022-09-05: qty 1

## 2022-09-05 MED ORDER — LIDOCAINE HCL (CARDIAC) PF 100 MG/5ML IV SOSY
PREFILLED_SYRINGE | INTRAVENOUS | Status: DC | PRN
  Administered 2022-09-05: 100 mg via INTRAVENOUS

## 2022-09-05 MED ORDER — LACTATED RINGERS IV SOLN: INTRAVENOUS | Status: DC

## 2022-09-05 MED ORDER — SODIUM CHLORIDE 0.9 % IR SOLN
Status: DC | PRN
  Administered 2022-09-05: 3000 mL

## 2022-09-05 MED ORDER — BUPIVACAINE HCL (PF) 0.25 % IJ SOLN
INTRAMUSCULAR | Status: AC
  Filled 2022-09-05: qty 60

## 2022-09-05 MED ORDER — ONDANSETRON HCL 4 MG/2ML IJ SOLN
INTRAMUSCULAR | Status: DC | PRN
  Administered 2022-09-05: 4 mg via INTRAVENOUS

## 2022-09-05 MED ORDER — KETOROLAC TROMETHAMINE 10 MG PO TABS: 10.0000 mg | ORAL_TABLET | Freq: Three times a day (TID) | ORAL | 0 refills | Status: DC | PRN

## 2022-09-05 MED ORDER — EPHEDRINE 5 MG/ML INJ
INTRAVENOUS | Status: AC
  Filled 2022-09-05: qty 5

## 2022-09-05 MED ORDER — CHLORHEXIDINE GLUCONATE 0.12 % MT SOLN: 15.0000 mL | Freq: Once | OROMUCOSAL | Status: DC

## 2022-09-05 MED ORDER — ONDANSETRON 8 MG PO TBDP: 8.0000 mg | ORAL_TABLET | Freq: Three times a day (TID) | ORAL | 0 refills | Status: DC | PRN

## 2022-09-05 MED ORDER — ORAL CARE MOUTH RINSE: 15.0000 mL | Freq: Once | OROMUCOSAL | Status: DC

## 2022-09-05 MED ORDER — CIPROFLOXACIN HCL 500 MG PO TABS: 500.0000 mg | ORAL_TABLET | Freq: Two times a day (BID) | ORAL | 0 refills | Status: DC

## 2022-09-05 MED ORDER — KETOROLAC TROMETHAMINE 30 MG/ML IJ SOLN
INTRAMUSCULAR | Status: DC | PRN
Start: 2022-09-05 — End: 2022-09-05
  Administered 2022-09-05: 30 mg via INTRAVENOUS

## 2022-09-05 MED ORDER — PROPOFOL 10 MG/ML IV BOLUS
INTRAVENOUS | Status: AC
  Filled 2022-09-05: qty 20

## 2022-09-05 MED ORDER — FENTANYL CITRATE PF 50 MCG/ML IJ SOSY
PREFILLED_SYRINGE | INTRAMUSCULAR | Status: AC
  Filled 2022-09-05: qty 1

## 2022-09-05 MED ORDER — MIDAZOLAM HCL 5 MG/5ML IJ SOLN
INTRAMUSCULAR | Status: DC | PRN
  Administered 2022-09-05: 2 mg via INTRAVENOUS

## 2022-09-05 MED ORDER — SCOPOLAMINE 1 MG/3DAYS TD PT72
1.0000 | MEDICATED_PATCH | TRANSDERMAL | Status: DC
  Administered 2022-09-05: 1.5 mg via TRANSDERMAL

## 2022-09-05 MED ORDER — BUPIVACAINE HCL 0.25 % IJ SOLN
INTRAMUSCULAR | Status: DC | PRN
  Administered 2022-09-05: 60 mL

## 2022-09-05 MED ORDER — OXYCODONE-ACETAMINOPHEN 7.5-325 MG PO TABS: 1.0000 | ORAL_TABLET | Freq: Four times a day (QID) | ORAL | 0 refills | Status: DC | PRN

## 2022-09-05 MED ORDER — LIDOCAINE HCL (PF) 2 % IJ SOLN
INTRAMUSCULAR | Status: AC
  Filled 2022-09-05: qty 10

## 2022-09-05 MED ORDER — FENTANYL CITRATE (PF) 250 MCG/5ML IJ SOLN
INTRAMUSCULAR | Status: AC
  Filled 2022-09-05: qty 5

## 2022-09-05 MED ORDER — OXYCODONE HCL 5 MG/5ML PO SOLN: 5.0000 mg | Freq: Once | ORAL | Status: DC | PRN

## 2022-09-05 MED ORDER — PHENYLEPHRINE 80 MCG/ML (10ML) SYRINGE FOR IV PUSH (FOR BLOOD PRESSURE SUPPORT)
PREFILLED_SYRINGE | INTRAVENOUS | Status: AC
  Filled 2022-09-05: qty 10

## 2022-09-05 MED ORDER — FENTANYL CITRATE PF 50 MCG/ML IJ SOSY
25.0000 ug | PREFILLED_SYRINGE | INTRAMUSCULAR | Status: DC | PRN
  Administered 2022-09-05: 50 ug via INTRAVENOUS

## 2022-09-05 MED ORDER — SUGAMMADEX SODIUM 500 MG/5ML IV SOLN
INTRAVENOUS | Status: DC | PRN
  Administered 2022-09-05: 200 mg via INTRAVENOUS
  Administered 2022-09-05: 400 mg via INTRAVENOUS

## 2022-09-05 MED ORDER — KETOROLAC TROMETHAMINE 30 MG/ML IJ SOLN
INTRAMUSCULAR | Status: AC
  Filled 2022-09-05: qty 1

## 2022-09-05 MED ORDER — OXYCODONE HCL 5 MG PO TABS: 5.0000 mg | ORAL_TABLET | Freq: Once | ORAL | Status: DC | PRN

## 2022-09-05 MED ORDER — PROPOFOL 10 MG/ML IV BOLUS
INTRAVENOUS | Status: DC | PRN
  Administered 2022-09-05: 200 mg via INTRAVENOUS

## 2022-09-05 MED ORDER — CHLORHEXIDINE GLUCONATE 0.12 % MT SOLN
15.0000 mL | Freq: Once | OROMUCOSAL | Status: AC
  Administered 2022-09-05: 15 mL via OROMUCOSAL

## 2022-09-05 MED ORDER — ONDANSETRON HCL 4 MG/2ML IJ SOLN
INTRAMUSCULAR | Status: AC
  Filled 2022-09-05: qty 4

## 2022-09-05 MED ORDER — INSULIN ASPART 100 UNIT/ML IJ SOLN
10.0000 [IU] | Freq: Once | INTRAMUSCULAR | Status: AC
  Administered 2022-09-05: 10 [IU] via SUBCUTANEOUS
  Filled 2022-09-05: qty 0.1

## 2022-09-05 MED ORDER — STERILE WATER FOR IRRIGATION IR SOLN
Status: DC | PRN
  Administered 2022-09-05: 500 mL

## 2022-09-05 MED ORDER — ROCURONIUM BROMIDE 100 MG/10ML IV SOLN
INTRAVENOUS | Status: DC | PRN
  Administered 2022-09-05: 100 mg via INTRAVENOUS
  Administered 2022-09-05: 20 mg via INTRAVENOUS

## 2022-09-05 MED ORDER — ROCURONIUM BROMIDE 10 MG/ML (PF) SYRINGE
PREFILLED_SYRINGE | INTRAVENOUS | Status: AC
  Filled 2022-09-05: qty 20

## 2022-09-05 MED ORDER — MIDAZOLAM HCL 2 MG/2ML IJ SOLN
INTRAMUSCULAR | Status: AC
  Filled 2022-09-05: qty 2

## 2022-09-05 MED ORDER — SCOPOLAMINE 1 MG/3DAYS TD PT72: 1.0000 | MEDICATED_PATCH | Freq: Once | TRANSDERMAL | Status: DC

## 2022-09-05 SURGICAL SUPPLY — 55 items
ADH SKN CLS APL DERMABOND .7 (GAUZE/BANDAGES/DRESSINGS) ×1
ANTIFOG SOL W/FOAM PAD STRL (MISCELLANEOUS) ×1
BLADE SURG SZ11 CARB STEEL (BLADE) ×1 IMPLANT
CAUTERY HOOK MNPLR 1.6 DVNC XI (INSTRUMENTS) ×1 IMPLANT
COVER LIGHT HANDLE STERIS (MISCELLANEOUS) ×2 IMPLANT
COVER MAYO STAND XLG (MISCELLANEOUS) ×1 IMPLANT
DERMABOND ADVANCED .7 DNX12 (GAUZE/BANDAGES/DRESSINGS) ×1 IMPLANT
DRAPE ARM DVNC X/XI (DISPOSABLE) ×4 IMPLANT
DRAPE COLUMN DVNC XI (DISPOSABLE) ×1 IMPLANT
DRIVER NDL MEGA SUTCUT DVNCXI (INSTRUMENTS) ×1 IMPLANT
DRIVER NDLE MEGA SUTCUT DVNCXI (INSTRUMENTS) ×1
ELECT REM PT RETURN 9FT ADLT (ELECTROSURGICAL) ×1
ELECTRODE REM PT RTRN 9FT ADLT (ELECTROSURGICAL) ×1 IMPLANT
GAUZE 4X4 16PLY ~~LOC~~+RFID DBL (SPONGE) ×2 IMPLANT
GLOVE BIOGEL PI IND STRL 7.0 (GLOVE) ×4 IMPLANT
GLOVE BIOGEL PI IND STRL 8 (GLOVE) ×2 IMPLANT
GLOVE ECLIPSE 8.0 STRL XLNG CF (GLOVE) ×3 IMPLANT
GOWN STRL REUS W/TWL LRG LVL3 (GOWN DISPOSABLE) ×2 IMPLANT
GOWN STRL REUS W/TWL XL LVL3 (GOWN DISPOSABLE) ×2 IMPLANT
GYRUS RUMI II 4.0CM BLUE (DISPOSABLE) ×1
IV NS IRRIG 3000ML ARTHROMATIC (IV SOLUTION) IMPLANT
KIT PINK PAD W/HEAD ARE REST (MISCELLANEOUS) ×1
KIT PINK PAD W/HEAD ARM REST (MISCELLANEOUS) ×1 IMPLANT
KIT TURNOVER CYSTO (KITS) ×1 IMPLANT
MANIFOLD NEPTUNE II (INSTRUMENTS) ×1 IMPLANT
NDL HYPO 21X1.5 SAFETY (NEEDLE) ×1 IMPLANT
NDL INSUFFLATION 14GA 120MM (NEEDLE) ×1 IMPLANT
NEEDLE HYPO 21X1.5 SAFETY (NEEDLE) ×1
NEEDLE INSUFFLATION 14GA 120MM (NEEDLE) ×1
OBTURATOR OPTICAL STND 8 DVNC (TROCAR) ×1
OBTURATOR OPTICALSTD 8 DVNC (TROCAR) ×1 IMPLANT
PACK PERI GYN (CUSTOM PROCEDURE TRAY) ×1 IMPLANT
POSITIONER HEAD 8X9X4 ADT (SOFTGOODS) ×1 IMPLANT
RUMI II GYRUS 4.0CM BLUE (DISPOSABLE) IMPLANT
SEAL UNIV 5-12 XI (MISCELLANEOUS) ×3 IMPLANT
SEALER VESSEL EXT DVNC XI (MISCELLANEOUS) ×1 IMPLANT
SET BASIN LINEN APH (SET/KITS/TRAYS/PACK) ×1 IMPLANT
SET TRI-LUMEN FLTR TB AIRSEAL (TUBING) ×1 IMPLANT
SET TUBE DA VINCI INSUFFLATOR (TUBING) IMPLANT
SET TUBE IRRIG SUCTION NO TIP (IRRIGATION / IRRIGATOR) IMPLANT
SOLUTION ANTFG W/FOAM PAD STRL (MISCELLANEOUS) ×1 IMPLANT
SPONGE T-LAP 18X18 ~~LOC~~+RFID (SPONGE) ×1 IMPLANT
SUT STRATAFIX 0 PDS+ CT-2 23 (SUTURE) ×1
SUT VICRYL 0 AB UR-6 (SUTURE) ×1 IMPLANT
SUT VICRYL AB 3-0 FS1 BRD 27IN (SUTURE) ×1 IMPLANT
SUTURE STRATFX 0 PDS+ CT-2 23 (SUTURE) ×1 IMPLANT
SYR 10ML LL (SYRINGE) ×2 IMPLANT
SYR 20ML LL LF (SYRINGE) ×1 IMPLANT
SYR 50ML LL SCALE MARK (SYRINGE) ×1 IMPLANT
SYR CONTROL 10ML LL (SYRINGE) ×2 IMPLANT
TIP UTERINE 6.7X8CM BLUE DISP (MISCELLANEOUS) IMPLANT
TRAY FOL W/BAG SLVR 16FR STRL (SET/KITS/TRAYS/PACK) ×1 IMPLANT
TRAY FOLEY W/BAG SLVR 16FR LF (SET/KITS/TRAYS/PACK) ×1
TROCAR PORT AIRSEAL 8X120 (TROCAR) ×1 IMPLANT
WATER STERILE IRR 500ML POUR (IV SOLUTION) ×1 IMPLANT

## 2022-09-05 NOTE — Anesthesia Preprocedure Evaluation (Signed)
Anesthesia Evaluation  Patient identified by MRN, date of birth, ID band Patient awake    Reviewed: Allergy & Precautions, H&P , NPO status , Patient's Chart, lab work & pertinent test results, reviewed documented beta blocker date and time   History of Anesthesia Complications (+) PONV and history of anesthetic complications  Airway Mallampati: II  TM Distance: >3 FB Neck ROM: full    Dental no notable dental hx.    Pulmonary neg pulmonary ROS, asthma , sleep apnea , former smoker   Pulmonary exam normal breath sounds clear to auscultation       Cardiovascular Exercise Tolerance: Good hypertension, + DOE  negative cardio ROS  Rhythm:regular Rate:Normal     Neuro/Psych negative neurological ROS  negative psych ROS   GI/Hepatic negative GI ROS, Neg liver ROS,GERD  ,,(+) Hepatitis -  Endo/Other  diabetes  Morbid obesity  Renal/GU Renal diseasenegative Renal ROS  negative genitourinary   Musculoskeletal   Abdominal   Peds  Hematology negative hematology ROS (+)   Anesthesia Other Findings   Reproductive/Obstetrics negative OB ROS                             Anesthesia Physical Anesthesia Plan  ASA: 3  Anesthesia Plan: General and General ETT   Post-op Pain Management:    Induction:   PONV Risk Score and Plan: Scopolamine patch - Pre-op and Ondansetron  Airway Management Planned:   Additional Equipment:   Intra-op Plan:   Post-operative Plan:   Informed Consent: I have reviewed the patients History and Physical, chart, labs and discussed the procedure including the risks, benefits and alternatives for the proposed anesthesia with the patient or authorized representative who has indicated his/her understanding and acceptance.     Dental Advisory Given  Plan Discussed with: CRNA  Anesthesia Plan Comments:        Anesthesia Quick Evaluation

## 2022-09-05 NOTE — Anesthesia Postprocedure Evaluation (Signed)
Anesthesia Post Note  Patient: Cheryl Black  Procedure(s) Performed: XI ROBOTIC ASSISTED TOTAL HYSTERECTOMY WITH BILATERAL SALPINGO (Bilateral)  Patient location during evaluation: Phase II Anesthesia Type: General Level of consciousness: awake Pain management: pain level controlled Vital Signs Assessment: post-procedure vital signs reviewed and stable Respiratory status: spontaneous breathing and respiratory function stable Cardiovascular status: blood pressure returned to baseline and stable Postop Assessment: no headache and no apparent nausea or vomiting Anesthetic complications: no Comments: Late entry   No notable events documented.   Last Vitals:  Vitals:   09/05/22 1530 09/05/22 1543  BP: (!) 132/90   Pulse: 66 (!) 52  Resp: 19 20  Temp:    SpO2: 100% 97%    Last Pain:  Vitals:   09/05/22 1530  PainSc: Asleep                 Windell Norfolk

## 2022-09-05 NOTE — Progress Notes (Signed)
Patient voided just before leaving.

## 2022-09-05 NOTE — H&P (Signed)
Preoperative History and Physical  Cheryl Black is a 37 y.o. G0P0000 with No LMP recorded. (Menstrual status: Irregular Periods). admitted for a RA TLH bilateral salpingectomy.  Pt has unmanageable vaginal bleeding essentially all year, does not respond to anything including megestrol She is nulliparous and we have had frank discussions and she definitely wants definitive surrgical management   PMH:    Past Medical History:  Diagnosis Date   Acid reflux    Asthma    Clostridium difficile infection    in the past   Diabetes mellitus    Fatty liver    Fibrocystic breast disease    Gout    Hidradenitis    History of kidney stones    Hyperlipemia    Obesity    Polycystic ovary syndrome    PONV (postoperative nausea and vomiting)    Respiratory failure (HCC)    "2018 and 2019"   Sleep apnea    Smoker     PSH:     Past Surgical History:  Procedure Laterality Date   CHOLECYSTECTOMY  01/10/2011   Procedure: LAPAROSCOPIC CHOLECYSTECTOMY;  Surgeon: Dalia Heading;  Location: [facility];  Service: General;  Laterality: N/A;   ESOPHAGOGASTRODUODENOSCOPY (EGD) WITH PROPOFOL N/A 09/09/2018   RMR: Mild erosive reflux esophagitis.  Esophagus dilated for history of dysphagia.   HYDRADENITIS EXCISION Right 06/18/2012   Procedure: EXCISION HYDRIADENITIS SUPRATIVA  RIGHT AXILLA;  Surgeon: Marlane Hatcher, MD;  Location: [facility];  Service: General;  Laterality: Right;   LIVER BIOPSY  01/10/2011   Procedure: LIVER BIOPSY;  Surgeon: Dalia Heading;  Location: [facility];  Service: General;;   MALONEY DILATION N/A 09/09/2018   Procedure: Elease Hashimoto DILATION;  Surgeon: Corbin Ade, MD;  Location: [facility];  Service: Endoscopy;  Laterality: N/A;   SKIN SPLIT GRAFT Right 06/18/2012   Procedure: SKIN GRAFT SPLIT THICKNESS RIGHT AXILLA(WITH TWO STANDARD SKIN BOARDS 3"x 8" )  DONOR SITE RIGHT AND LEFT THIGHS;  Surgeon: Marlane Hatcher, MD;  Location: [facility];  Service: General;  Laterality: Right;     POb/GynH:      OB History     Gravida  0   Para  0   Term  0   Preterm  0   AB  0   Living  0      SAB  0   IAB  0   Ectopic  0   Multiple  0   Live Births  0           SH:   Social History   Tobacco Use   Smoking status: Former    Current packs/day: 0.00    Average packs/day: 1 pack/day for 10.0 years (10.0 ttl pk-yrs)    Types: Cigarettes    Start date: 01/07/2008    Quit date: 01/06/2018    Years since quitting: 4.6    Passive exposure: Current   Smokeless tobacco: Never  Vaping Use   Vaping status: Never Used  Substance Use Topics   Alcohol use: Not Currently    Comment: occas   Drug use: No    FH:    Family History  Problem Relation Age of Onset   Other Mother        pre-cancerous cells; had hyst   Emphysema Mother        smoker   COPD Mother    Diabetes Father    Hypertension Father    Cancer Sister  cervical; had hyst   Gout Brother    Hypertension Maternal Grandmother    Heart attack Maternal Grandfather    Cancer Maternal Grandfather        adenocarcinoma   Emphysema Maternal Grandfather        smoked   Hypertension Paternal Grandmother    Diabetes Paternal Grandmother    Gout Paternal Grandmother    Asthma Maternal Uncle    Stroke Other        paternal great grandfather   Colon cancer Neg Hx    Liver disease Neg Hx    Liver cancer Neg Hx      Allergies:  Allergies  Allergen Reactions   Ace Inhibitors Anaphylaxis   Beta Adrenergic Blockers Other (See Comments)    Angioedema  Other reaction(s): Angioedema   Augmentin [Amoxicillin-Pot Clavulanate] Other (See Comments)    Has had C.diff in the past Did it involve swelling of the face/tongue/throat, SOB, or low BP? No Did it involve sudden or severe rash/hives, skin peeling, or any reaction on the inside of your mouth or nose? No Did you need to seek medical attention at a hospital or doctor's office? No When did it last happen?Unknown If all above answers  are "NO", may proceed with cephalosporin use.    Benicar [Olmesartan] Swelling    Facial edema   Doxycycline Other (See Comments)    Makes feet and hands red   Egg-Derived Products     Local reaction with injectable meds that contain eggs   Influenza Vaccine Live Swelling    Arm    Influenza Virus Vaccine Hives   Invokana [Canagliflozin] Other (See Comments)    Dehydration    Liraglutide Nausea And Vomiting    Medications:       Current Facility-Administered Medications:    clindamycin (CLEOCIN) IVPB 900 mg, 900 mg, Intravenous, 60 min Pre-Op **AND** gentamicin (GARAMYCIN) 630 mg in dextrose 5 % 100 mL IVPB, 5 mg/kg, Intravenous, 60 min Pre-Op, Jakelin Taussig, Amaryllis Dyke, MD   lactated ringers infusion, , Intravenous, Continuous, Kiel, Mosetta Putt, MD, Last Rate: 10 mL/hr at 09/05/22 1101, New Bag at 09/05/22 1101   scopolamine (TRANSDERM-SCOP) 1 MG/3DAYS 1.5 mg, 1 patch, Transdermal, Q72H, Kiel, Mosetta Putt, MD, 1.5 mg at 09/05/22 1119   scopolamine (TRANSDERM-SCOP) 1 MG/3DAYS, , , ,   Review of Systems:   Review of Systems  Constitutional: Negative for fever, chills, weight loss, malaise/fatigue and diaphoresis.  HENT: Negative for hearing loss, ear pain, nosebleeds, congestion, sore throat, neck pain, tinnitus and ear discharge.   Eyes: Negative for blurred vision, double vision, photophobia, pain, discharge and redness.  Respiratory: Negative for cough, hemoptysis, sputum production, shortness of breath, wheezing and stridor.   Cardiovascular: Negative for chest pain, palpitations, orthopnea, claudication, leg swelling and PND.  Gastrointestinal: Positive for abdominal pain. Negative for heartburn, nausea, vomiting, diarrhea, constipation, blood in stool and melena.  Genitourinary: Negative for dysuria, urgency, frequency, hematuria and flank pain.  Musculoskeletal: Negative for myalgias, back pain, joint pain and falls.  Skin: Negative for itching and rash.  Neurological: Negative for  dizziness, tingling, tremors, sensory change, speech change, focal weakness, seizures, loss of consciousness, weakness and headaches.  Endo/Heme/Allergies: Negative for environmental allergies and polydipsia. Does not bruise/bleed easily.  Psychiatric/Behavioral: Negative for depression, suicidal ideas, hallucinations, memory loss and substance abuse. The patient is not nervous/anxious and does not have insomnia.      PHYSICAL EXAM:  Blood pressure 122/89, pulse 80, temperature 98.5 F (36.9 C), resp.  rate 18, height 5\' 9"  (1.753 m), weight 125.2 kg, SpO2 96%.    Vitals reviewed. Constitutional: She is oriented to person, place, and time. She appears well-developed and well-nourished.  HENT:  Head: Normocephalic and atraumatic.  Right Ear: External ear normal.  Left Ear: External ear normal.  Nose: Nose normal.  Mouth/Throat: Oropharynx is clear and moist.  Eyes: Conjunctivae and EOM are normal. Pupils are equal, round, and reactive to light. Right eye exhibits no discharge. Left eye exhibits no discharge. No scleral icterus.  Neck: Normal range of motion. Neck supple. No tracheal deviation present. No thyromegaly present.  Cardiovascular: Normal rate, regular rhythm, normal heart sounds and intact distal pulses.  Exam reveals no gallop and no friction rub.   No murmur heard. Respiratory: Effort normal and breath sounds normal. No respiratory distress. She has no wheezes. She has no rales. She exhibits no tenderness.  GI: Soft. Bowel sounds are normal. She exhibits no distension and no mass. There is tenderness. There is no rebound and no guarding.  Genitourinary:       Vulva is normal without lesions Vagina is pink moist without discharge Cervix normal in appearance and pap is normal Uterus is normal size, contour, position, consistency, mobility, non-tender Adnexa is negative with normal sized ovaries by sonogram  Musculoskeletal: Normal range of motion. She exhibits no edema and no  tenderness.  Neurological: She is alert and oriented to person, place, and time. She has normal reflexes. She displays normal reflexes. No cranial nerve deficit. She exhibits normal muscle tone. Coordination normal.  Skin: Skin is warm and dry. No rash noted. No erythema. No pallor.  Psychiatric: She has a normal mood and affect. Her behavior is normal. Judgment and thought content normal.    Labs: Results for orders placed or performed during the hospital encounter of 09/05/22 (from the past 336 hour(s))  Glucose, capillary   Collection Time: 09/05/22 10:42 AM  Result Value Ref Range   Glucose-Capillary 271 (H) 70 - 99 mg/dL  Glucose, capillary   Collection Time: 09/05/22 11:43 AM  Result Value Ref Range   Glucose-Capillary 253 (H) 70 - 99 mg/dL  Results for orders placed or performed during the hospital encounter of 09/03/22 (from the past 336 hour(s))  Basic metabolic panel   Collection Time: 09/03/22  9:28 AM  Result Value Ref Range   Sodium 132 (L) 135 - 145 mmol/L   Potassium 3.9 3.5 - 5.1 mmol/L   Chloride 103 98 - 111 mmol/L   CO2 21 (L) 22 - 32 mmol/L   Glucose, Bld 288 (H) 70 - 99 mg/dL   BUN 8 6 - 20 mg/dL   Creatinine, Ser 4.01 0.44 - 1.00 mg/dL   Calcium 8.7 (L) 8.9 - 10.3 mg/dL   GFR, Estimated >02 >72 mL/min   Anion gap 8 5 - 15  Hemoglobin A1c   Collection Time: 09/03/22  9:28 AM  Result Value Ref Range   Hgb A1c MFr Bld 7.6 (H) 4.8 - 5.6 %   Mean Plasma Glucose 171.42 mg/dL  Pregnancy, urine POC   Collection Time: 09/03/22  9:39 AM  Result Value Ref Range   Preg Test, Ur NEGATIVE NEGATIVE  Results for orders placed or performed during the hospital encounter of 08/25/22 (from the past 336 hour(s))  CBC   Collection Time: 08/25/22  2:22 PM  Result Value Ref Range   WBC 6.0 4.0 - 10.5 K/uL   RBC 5.29 (H) 3.87 - 5.11 MIL/uL   Hemoglobin  16.2 (H) 12.0 - 15.0 g/dL   HCT 82.9 (H) 56.2 - 13.0 %   MCV 88.7 80.0 - 100.0 fL   MCH 30.6 26.0 - 34.0 pg   MCHC 34.5  30.0 - 36.0 g/dL   RDW 86.5 78.4 - 69.6 %   Platelets 302 150 - 400 K/uL   nRBC 0.0 0.0 - 0.2 %  Comprehensive metabolic panel   Collection Time: 08/25/22  2:22 PM  Result Value Ref Range   Sodium 135 135 - 145 mmol/L   Potassium 2.7 (LL) 3.5 - 5.1 mmol/L   Chloride 100 98 - 111 mmol/L   CO2 21 (L) 22 - 32 mmol/L   Glucose, Bld 175 (H) 70 - 99 mg/dL   BUN 10 6 - 20 mg/dL   Creatinine, Ser 2.95 0.44 - 1.00 mg/dL   Calcium 8.9 8.9 - 28.4 mg/dL   Total Protein 7.6 6.5 - 8.1 g/dL   Albumin 3.9 3.5 - 5.0 g/dL   AST 38 15 - 41 U/L   ALT 39 0 - 44 U/L   Alkaline Phosphatase 58 38 - 126 U/L   Total Bilirubin 0.6 0.3 - 1.2 mg/dL   GFR, Estimated >13 >24 mL/min   Anion gap 14 5 - 15  Rapid HIV screen (HIV 1/2 Ab+Ag)   Collection Time: 08/25/22  2:22 PM  Result Value Ref Range   HIV-1 P24 Antigen - HIV24 NON REACTIVE NON REACTIVE   HIV 1/2 Antibodies NON REACTIVE NON REACTIVE   Interpretation (HIV Ag Ab)      A non reactive test result means that HIV 1 or HIV 2 antibodies and HIV 1 p24 antigen were not detected in the specimen.  Urinalysis, Routine w reflex microscopic -Urine, Clean Catch   Collection Time: 08/25/22  2:22 PM  Result Value Ref Range   Color, Urine YELLOW YELLOW   APPearance CLEAR CLEAR   Specific Gravity, Urine 1.027 1.005 - 1.030   pH 5.0 5.0 - 8.0   Glucose, UA >=500 (A) NEGATIVE mg/dL   Hgb urine dipstick MODERATE (A) NEGATIVE   Bilirubin Urine NEGATIVE NEGATIVE   Ketones, ur NEGATIVE NEGATIVE mg/dL   Protein, ur NEGATIVE NEGATIVE mg/dL   Nitrite NEGATIVE NEGATIVE   Leukocytes,Ua NEGATIVE NEGATIVE   RBC / HPF 0-5 0 - 5 RBC/hpf   WBC, UA 0-5 0 - 5 WBC/hpf   Bacteria, UA NONE SEEN NONE SEEN   Squamous Epithelial / HPF 0-5 0 - 5 /HPF  Pregnancy, urine   Collection Time: 08/25/22  2:22 PM  Result Value Ref Range   Preg Test, Ur NEGATIVE NEGATIVE    EKG: Orders placed or performed during the hospital encounter of 07/09/22   EKG 12-Lead   EKG 12-Lead   ED  EKG 12-Lead   ED EKG 12-Lead    Imaging Studies: No results found.    Assessment: Menometrorrhagia Dysmenorrhea  Plan: RA TLH BS  Lazaro Arms 09/05/2022 12:45 PM

## 2022-09-05 NOTE — Op Note (Signed)
Preoperative diagnosis: Menometrorrhagia, unresponsive to conservative measures Dysmenorrhea Diabetes at long term risk for endometrial pathology   Postoperative diagnosis: SAA   Procedure: Xi Robotic hysterectomy with bilatera salpingectomy  Laparoscopic guided transversus abdominus plane block   Surgeon: Lazaro Arms, MD   Anesthesia: General endotracheal   Findings: Globular uterus, larger than sonogram estimate, soft possibly adenomyosis Ovarian remnants bilaterally along ovarian development No other intra peritoneal abnormalities are noted   Description of operation: Patient was taken to the operating room and placed in the low lithotomy position She was prepped and draped in the usual sterile fashion robotic assisted laparoscopic procedure A Foley catheter was placed The vagina was once again prepped additionally   A RUMI II 8 cm with 4 cervical cup was placed for uterine manipulation and colpotomy delineation   An incision was made above the umbilicus The umbilical fascia was grasped A varies needle was used and placed into the peritoneum with 1 pass A pneumoperitoneum was created to a pressure of 15   An 8 mm port was placed into the peritoneal cavity using a nonbladed trocar easily with 1 pass using the video laparoscope The peritoneal cavity was confirmed   There were no unusual findings Minor congenital adhesions of the ascending colon to the right pelvic sidewall   3 additional 8 mm ports were placed at approximately the same level as the supraumbilical port 1 of the ports were robotic and 1 is an assist port   One was left lateral and 1 was placed right lateral, both lateral to the rectus anterior muscle These were placed under direct visualization without difficulty into the peritoneal cavity Nonbladed trocars were used in all instances    The patient was placed in 24 degrees of Trendelenburg   The [employer] robot was then docked to the 3  robotic ports   Instruments used during the robotic hysterectomy: Vessel sealer extender using bipolar energy ProGrasp forceps with no energy Monopolar hook Megacut needle driver 2-0 PDS symmetrical STRATAFIX on a CT 2 needle   I then left the patient and went to the surgical console   The RUMI II  was used throughout the case to apply traction and anterior/posterior displacement of the uterus to facilitate the robotic procedure The ProGrasp was used to put traction on the left adnexa The left ureter was identified and found to be well away from the adnexal vessels The vessel sealer extender using bipolar energy was used and the utero-ovarian ligament was coagulated and then transected I used traction medially and anteriorly and used the vessel sealer to take the broad ligament and round ligament down to the level of the cervical isthmus just lateral to the left uterine vessels   I then turned my attention to the right adnexa The pro grasp was used and medial and upward traction was placed The vessel sealer extender using bipolar energy was used to coagulate and ligate the right utero ovarian  ligament vessels Again the right ureter was well inferior to the vessels I continued use medial and anterior retraction using the ProGrasp and the vessel sealer with the bipolar energy was used to take the broad ligament and round ligament down to the level of the cervical isthmus and lateral to the uterine vessels   I then placed the monopolar hook in the place of the ProGrasp The vessel sealer extender was used to grasp the peritoneum of the bladder provide traction, of course no energy was used for this I  used the monopolar hook with energy to open of the peritoneal leaf anteriorly and dissect the bladder peritoneum off the lower uterine segment and cervix beyond the level of the vaginal colpotomy cup I then used the monopolar hook to take the peritoneum down where I stopped using the vessel sealer  and met in the bladder dissection bilaterally   The vessel sealer extender with monopolar energy was used to coagulate the uterine vessels at the level of the cervical isthmus bilaterally and then just above and just below to manage backbleeding The uterine vessels were transected There was good hemostasis I then stayed medial to this uterine vessel pedicle with the vessel sealer extender and took down the paracervical tissue to allow colpotomy incision using the monopolar hook, preserving the cardinal ligament Again there was good hemostasis bilaterally   I then used the monopolar hook and made a posterior colpotomy incision above the level of insertion of the uterosacral ligaments I used a frowny face then smiley face technique and opened the vagina to approximately 8:00 and 4:00 posteriorly I then used the vessel sealer extender with bipolar energy, coagulating and then transecting the vagina   The monopolar hook was used to make the anterior colpotomy incision as the bladder had been dissected well past the colpotomy ring Cephalad tension was placed on the Vcare handle throughout this portion of the procedure to prevent thermal injury to the bladder and safe distance from the ureters bilaterally Colpotomy incision was made from 10:00 to 2:00 The vessel sealer with bipolar energy was then used to coagulate and transect the vagina, again inside of the cardinal ligament   The uterus was removed through the vagina   The tubes were also removed through the vagina A wet lap tape was placed in the vagina to maintain pneumoperitoneum   All pedicles were found to be hemostatic there was very little blood loss I then removed the vessel sealer and monopolar hook The pro grasp was replaced and the needle driver was also placed along with the suture   The vagina was closed using the 2-0 PDS STRATAFIX symmetrical suture on the CT 2 needle I pay close attention to the vaginal corners bilaterally and  incorporated those in the closure 1.5 cm of vagina was operating to the vaginal closure I also fixed the uterosacral ligaments to the vaginal cuff repair shortening the uterosacral ligaments thus providing improved vaginal vault suspension The cardinal ligaments were intact again improving postoperative vaginal support Pubocervical rectovaginal and posterior peritoneum were all included in the vaginal closure There was good result hemostasis   At the end of the procedure all the pedicles were identified and found to be hemostatic Both ureters were identified and undergoing normal peristalsis, they were well out of the way of surgical field throughout   I then got up from the console and went back to the side of the patient after gowning and gloving sterilely  A transversus abdominus plane block was placed using laparoscopic guidance at T10 and T7 bilaterally, 10 cc of 0.25% bupivacaine at each site, 40 cc total of 100 mg of bupivacaine.  Doyle's bubble technique was used.        The insufflation ports were used to actively desufflate the peritoneal cavity to a pressure of 0 Ports were then removed   The subcutaneous tissue of all 4 incisions was closed using 0 Vicryl All 4 skin incisions were closed using 3-0 vicryl in a subcuticular manner Dermabond was used all 4 incision  sites  Each incision was dressed   The patient tolerated the procedure well She received clindamycin and gentamicin due to allergy of augmentin and 30 mg of Toradol preoperatively   EBL: 10 cc   Lazaro Arms, MD 09/05/2022 3:03 PM

## 2022-09-05 NOTE — Discharge Instructions (Addendum)
Anesthesia takes 24 hours to wear out of system. NO driving, no signing legal documents, operating heavy machinery for 24 hours, or until you are no longer taking your prescribed pain medicine.  NO heavy lifting until the doctor has cleared you to do so. Call doctor for signs of infection at incision sites such as pain unrelieved by pain medicine, redness around incision sites, drainage with a foul odor, fever or chills. Each incision site has "skin glue" as the bandage. This will come off on its own in 7-10 days. Do not pick at it, do not submerge under water such as in a swimming pool or bathtub. It is okay to get them wet in the shower after 24 hours. May wear a peri-pad for vaginal bleeding. Call doctor right away if you are filling a pad completely within an hour.  Take pain meds with food as prescribed. Light diet today, may resume normal diet starting tomorrow. Lots of fluids to help rehydrate.

## 2022-09-05 NOTE — Anesthesia Procedure Notes (Signed)
Procedure Name: Intubation Date/Time: 09/05/2022 12:51 PM  Performed by: Shanon Payor, CRNAPre-anesthesia Checklist: Patient identified, Emergency Drugs available, Suction available, Patient being monitored and Timeout performed Patient Re-evaluated:Patient Re-evaluated prior to induction Oxygen Delivery Method: Circle system utilized Preoxygenation: Pre-oxygenation with 100% oxygen Induction Type: IV induction Ventilation: Mask ventilation without difficulty Laryngoscope Size: Mac and 3 Grade View: Grade I Tube type: Oral Tube size: 7.0 mm Number of attempts: 1 Airway Equipment and Method: Stylet Placement Confirmation: ETT inserted through vocal cords under direct vision, positive ETCO2, breath sounds checked- equal and bilateral and CO2 detector Secured at: 22 cm Tube secured with: Tape Dental Injury: Teeth and Oropharynx as per pre-operative assessment

## 2022-09-05 NOTE — Transfer of Care (Signed)
Immediate Anesthesia Transfer of Care Note  Patient: Cheryl Black  Procedure(s) Performed: XI ROBOTIC ASSISTED TOTAL HYSTERECTOMY WITH BILATERAL SALPINGO (Bilateral)  Patient Location: PACU  Anesthesia Type:General  Level of Consciousness: awake, alert , oriented, and patient cooperative  Airway & Oxygen Therapy: Patient Spontanous Breathing and Patient connected to face mask oxygen  Post-op Assessment: Report given to RN, Post -op Vital signs reviewed and stable, and Patient moving all extremities X 4  Post vital signs: Reviewed and stable  Last Vitals:  Vitals Value Taken Time  BP 125/90   Temp 97.7   Pulse 80   Resp 20   SpO2 100     Last Pain:  Vitals:   09/05/22 1046  PainSc: 0-No pain         Complications: No notable events documented.

## 2022-09-06 ENCOUNTER — Telehealth: Payer: Commercial Managed Care - PPO | Admitting: Nurse Practitioner

## 2022-09-06 ENCOUNTER — Other Ambulatory Visit (HOSPITAL_COMMUNITY): Payer: Self-pay

## 2022-09-06 DIAGNOSIS — L309 Dermatitis, unspecified: Secondary | ICD-10-CM

## 2022-09-06 MED ORDER — PREDNISONE 10 MG PO TABS
ORAL_TABLET | ORAL | 0 refills | Status: DC
Start: 2022-09-06 — End: 2022-09-09

## 2022-09-06 NOTE — Progress Notes (Signed)
I have spent 5 minutes in review of e-visit questionnaire, review and updating patient chart, medical decision making and response to patient.  ° °Zelda W Fleming, NP ° °  °

## 2022-09-06 NOTE — Progress Notes (Signed)
E Visit for Rash  We are sorry that you are not feeling well. Here is how we plan to help!  Based on what you shared with me it looks like you have contact dermatitis.  Contact dermatitis is a skin rash caused by something that touches the skin and causes irritation or inflammation.  Your skin may be red, swollen, dry, cracked, and itch.  The rash should go away in a few days but can last a few weeks.  If you get a rash, it's important to figure out what caused it so the irritant can be avoided in the future. Based on this I have prescribed:   Prednisone 10 mg daily for 6 days (see taper instructions below)  Directions for 6 day taper: Day 1: 2 tablets before breakfast, 1 after both lunch & dinner and 2 at bedtime Day 2: 1 tab before breakfast, 1 after both lunch & dinner and 2 at bedtime Day 3: 1 tab at each meal & 1 at bedtime Day 4: 1 tab at breakfast, 1 at lunch, 1 at bedtime Day 5: 1 tab at breakfast & 1 tab at bedtime Day 6: 1 tab at breakfast    HOME CARE:  Take cool showers and avoid direct sunlight. Apply cool compress or wet dressings. Take a bath in an oatmeal bath.  Sprinkle content of one Aveeno packet under running faucet with comfortably warm water.  Bathe for 15-20 minutes, 1-2 times daily.  Pat dry with a towel. Do not rub the rash. Use hydrocortisone cream. Take an antihistamine like Benadryl for widespread rashes that itch.  The adult dose of Benadryl is 25-50 mg by mouth 4 times daily. Caution:  This type of medication may cause sleepiness.  Do not drink alcohol, drive, or operate dangerous machinery while taking antihistamines.  Do not take these medications if you have prostate enlargement.  Read package instructions thoroughly on all medications that you take.  GET HELP RIGHT AWAY IF:  Symptoms don't go away after treatment. Severe itching that persists. If you rash spreads or swells. If you rash begins to smell. If it blisters and opens or develops a  yellow-brown crust. You develop a fever. You have a sore throat. You become short of breath.  MAKE SURE YOU:  Understand these instructions. Will watch your condition. Will get help right away if you are not doing well or get worse.  Thank you for choosing an e-visit.  Your e-visit answers were reviewed by a board certified advanced clinical practitioner to complete your personal care plan. Depending upon the condition, your plan could have included both over the counter or prescription medications.  Please review your pharmacy choice. Make sure the pharmacy is open so you can pick up prescription now. If there is a problem, you may contact your provider through [employer] and have the prescription routed to another pharmacy.  Your safety is important to [name]. If you have drug allergies check your prescription carefully.   For the next 24 hours you can use MyChart to ask questions about today's visit, request a non-urgent call back, or ask for a work or school excuse. You will get an email in the next two days asking about your experience. I hope that your e-visit has been valuable and will speed your recovery.

## 2022-09-07 ENCOUNTER — Emergency Department (HOSPITAL_COMMUNITY)
Admission: EM | Admit: 2022-09-07 | Discharge: 2022-09-07 | Disposition: A | Discharge status: Home / Self Care | Payer: Commercial Managed Care - PPO | Attending: Emergency Medicine | Admitting: Emergency Medicine

## 2022-09-07 ENCOUNTER — Other Ambulatory Visit: Payer: Self-pay

## 2022-09-07 ENCOUNTER — Emergency Department (HOSPITAL_COMMUNITY): Payer: Commercial Managed Care - PPO

## 2022-09-07 DIAGNOSIS — T7840XA Allergy, unspecified, initial encounter: Secondary | ICD-10-CM | POA: Insufficient documentation

## 2022-09-07 DIAGNOSIS — Z79899 Other long term (current) drug therapy: Secondary | ICD-10-CM | POA: Diagnosis not present

## 2022-09-07 DIAGNOSIS — Z7984 Long term (current) use of oral hypoglycemic drugs: Secondary | ICD-10-CM | POA: Insufficient documentation

## 2022-09-07 DIAGNOSIS — R21 Rash and other nonspecific skin eruption: Secondary | ICD-10-CM | POA: Diagnosis not present

## 2022-09-07 DIAGNOSIS — R079 Chest pain, unspecified: Secondary | ICD-10-CM | POA: Diagnosis not present

## 2022-09-07 DIAGNOSIS — R0789 Other chest pain: Secondary | ICD-10-CM | POA: Diagnosis not present

## 2022-09-07 LAB — TROPONIN I (HIGH SENSITIVITY)
Troponin I (High Sensitivity): 3 ng/L (ref <18)
Troponin I (High Sensitivity): 3 ng/L (ref <18)

## 2022-09-07 LAB — CBC WITH DIFFERENTIAL/PLATELET
Abs Immature Granulocytes: 0.02 10*3/uL (ref 0.00–0.07)
Basophils Absolute: 0 10*3/uL (ref 0.0–0.1)
Basophils Relative: 0 %
Eosinophils Absolute: 0.6 10*3/uL — ABNORMAL HIGH (ref 0.0–0.5)
Eosinophils Relative: 5 %
HCT: 44.5 % (ref 36.0–46.0)
Hemoglobin: 14.9 g/dL (ref 12.0–15.0)
Immature Granulocytes: 0 %
Lymphocytes Relative: 19 %
Lymphs Abs: 2.1 10*3/uL (ref 0.7–4.0)
MCH: 30 pg (ref 26.0–34.0)
MCHC: 33.5 g/dL (ref 30.0–36.0)
MCV: 89.7 fL (ref 80.0–100.0)
Monocytes Absolute: 0.8 10*3/uL (ref 0.1–1.0)
Monocytes Relative: 7 %
Neutro Abs: 7.7 10*3/uL (ref 1.7–7.7)
Neutrophils Relative %: 69 %
Platelets: 289 10*3/uL (ref 150–400)
RBC: 4.96 MIL/uL (ref 3.87–5.11)
RDW: 13 % (ref 11.5–15.5)
WBC: 11.2 10*3/uL — ABNORMAL HIGH (ref 4.0–10.5)
nRBC: 0 % (ref 0.0–0.2)

## 2022-09-07 LAB — COMPREHENSIVE METABOLIC PANEL
ALT: 37 U/L (ref 0–44)
AST: 20 U/L (ref 15–41)
Albumin: 3.3 g/dL — ABNORMAL LOW (ref 3.5–5.0)
Alkaline Phosphatase: 61 U/L (ref 38–126)
Anion gap: 9 (ref 5–15)
BUN: 14 mg/dL (ref 6–20)
CO2: 25 mmol/L (ref 22–32)
Calcium: 9.7 mg/dL (ref 8.9–10.3)
Chloride: 103 mmol/L (ref 98–111)
Creatinine, Ser: 0.83 mg/dL (ref 0.44–1.00)
GFR, Estimated: >60 mL/min (ref >60)
Glucose, Bld: 236 mg/dL — ABNORMAL HIGH (ref 70–99)
Potassium: 3.6 mmol/L (ref 3.5–5.1)
Sodium: 137 mmol/L (ref 135–145)
Total Bilirubin: 0.6 mg/dL (ref 0.3–1.2)
Total Protein: 6.3 g/dL — ABNORMAL LOW (ref 6.5–8.1)

## 2022-09-07 MED ORDER — DIPHENHYDRAMINE HCL 50 MG/ML IJ SOLN
25.0000 mg | Freq: Once | INTRAMUSCULAR | Status: AC
  Administered 2022-09-07: 25 mg via INTRAVENOUS
  Filled 2022-09-07: qty 1

## 2022-09-07 MED ORDER — METHYLPREDNISOLONE SODIUM SUCC 125 MG IJ SOLR
125.0000 mg | Freq: Once | INTRAMUSCULAR | Status: AC
  Administered 2022-09-07: 125 mg via INTRAVENOUS
  Filled 2022-09-07: qty 2

## 2022-09-07 MED ORDER — EPINEPHRINE 0.3 MG/0.3ML IJ SOAJ
0.3000 mg | Freq: Once | INTRAMUSCULAR | Status: AC
  Administered 2022-09-07: 0.3 mg via INTRAMUSCULAR
  Filled 2022-09-07: qty 0.3

## 2022-09-07 MED ORDER — SODIUM CHLORIDE 0.9 % IV BOLUS
500.0000 mL | Freq: Once | INTRAVENOUS | Status: AC
  Administered 2022-09-07: 500 mL via INTRAVENOUS

## 2022-09-07 MED ORDER — FAMOTIDINE IN NACL 20-0.9 MG/50ML-% IV SOLN
20.0000 mg | Freq: Once | INTRAVENOUS | Status: AC
  Administered 2022-09-07: 20 mg via INTRAVENOUS
  Filled 2022-09-07: qty 50

## 2022-09-07 NOTE — ED Triage Notes (Signed)
Pt had hysterectomy on Friday and woke up from anesthesia with a red itchy rash around her hairline.  Rash is now generalized.

## 2022-09-07 NOTE — Discharge Instructions (Signed)
Take Benadryl 25 mg every 4-6 hours for rash and itching.  Continue with your Claritin, Pepcid, prednisone.  Follow-up with your family doctor this week for recheck and return if getting worse

## 2022-09-07 NOTE — ED Provider Notes (Signed)
[place] [facility] AT [facility] Provider Note   CSN: [id number] Arrival date & time: 09/07/22  4098     History {Add pertinent medical, surgical, social history, OB history to HPI:1} Chief Complaint  Patient presents with   Allergic Reaction    Cheryl Black is a 37 y.o. female.  Patient recently had a hysterectomy and developed a rash shortly after that.  She was given clindamycin and gentamicin prior to the surgery.  She has a history of hyperlipidemia   Allergic Reaction      Home Medications Prior to Admission medications   Medication Sig Start Date End Date Taking? Authorizing Provider  acetaminophen (TYLENOL) 500 MG tablet Take 1,000 mg by mouth every 6 (six) hours as needed for moderate pain or headache.    [provider]  albuterol (PROVENTIL) (2.5 MG/3ML) 0.083% nebulizer solution Inhale 3 mLs (2.5 mg total) by nebulization every 2 (two) hours as needed for wheezing or shortness of breath. 03/27/22   Constantin Hillery Art, DO  albuterol (VENTOLIN HFA) 108 (90 Base) MCG/ACT inhaler Inhale 2 puffs into the lungs every 4 (four) - 6 (six)  hours as needed for shortness of breath. 01/10/22     allopurinol (ZYLOPRIM) 300 MG tablet Take 1 tablet (300 mg total) by mouth daily. 06/30/22     Azelastine-Fluticasone 137-50 MCG/ACT SUSP Place 1 spray into the nose in the morning and at bedtime. 04/15/22   Charlott Holler, MD  benzonatate (TESSALON PERLES) 100 MG capsule Take 1 capsule (100 mg total) by mouth 3 (three) times daily as needed. 08/23/22   Daphine Deutscher, Mary-Margaret, FNP  BIOTIN PO Take 1 tablet by mouth daily.    [provider]  Blood Glucose Monitoring Suppl (FREESTYLE LITE) w/Device KIT Use to test blood sugar 01/10/22     ciprofloxacin (CIPRO) 500 MG tablet Take 1 tablet (500 mg total) by mouth 2 (two) times daily. 09/05/22   Lazaro Arms, MD  colchicine 0.6 MG tablet Take 2 tablets by mouth then take 1 tablet by mouth 6 hours later as  directed. Patient taking differently: Take 0.6 mg by mouth daily as needed (gout). 06/30/22     Continuous Blood Gluc Sensor (DEXCOM G6 SENSOR) MISC Use as directed change every 10 days 06/27/21     Continuous Blood Gluc Transmit (DEXCOM G6 TRANSMITTER) MISC Use as directed every 90 days 12/19/20     Continuous Blood Gluc Transmit (DEXCOM G6 TRANSMITTER) MISC Use as directed with Dexcom sensor, replace every 90 days 02/06/21     Continuous Glucose Sensor (DEXCOM G7 SENSOR) MISC Use as directed for blood glucose monitoring, replace every 10 days. 12/06/21     dapagliflozin propanediol (FARXIGA) 5 MG TABS tablet Take 1 tablet (5 mg total) by mouth daily. 06/06/22     dextromethorphan-guaiFENesin (MUCINEX DM) 30-600 MG 12hr tablet Take 1 tablet by mouth 2 (two) times daily.    [provider]  fluticasone (FLONASE) 50 MCG/ACT nasal spray Place 2 sprays into both nostrils daily. Patient not taking: Reported on 08/29/2022 08/23/22   Bennie Pierini, FNP  fluticasone-salmeterol (ADVAIR HFA) [id number] MCG/ACT inhaler Inhale 2 puffs into the lungs 2 (two) times daily. 04/15/22   Charlott Holler, MD  Glucagon (GVOKE HYPOPEN 2-PACK) 1 MG/0.2ML SOAJ use under the skin for hypoglycemia as needed 01/31/22     glucose blood test strip Use 2-3 times daily to test blood sugar 01/10/22     hydrochlorothiazide (HYDRODIURIL) 12.5 MG tablet Take 1  tablet (12.5 mg total) by mouth daily for high blood pressure. 07/04/22     Insulin Pen Needle 32G X 4 MM MISC Use as directed to inject insulin 4 times a day 01/10/22     ketorolac (TORADOL) 10 MG tablet Take 1 tablet (10 mg total) by mouth every 8 (eight) hours as needed. 09/05/22   Lazaro Arms, MD  Lancets [facility] DELICA PLUS [place]) MISC Use to test blood sugar 2-3 times daily 01/10/22     loratadine (CLARITIN) 10 MG tablet Take 1 tablet (10 mg total) by mouth daily. 04/15/22   Charlott Holler, MD  metFORMIN (GLUCOPHAGE-XR) 500 MG 24 hr tablet Take 2 tablets (1,000 mg  total) by mouth daily. 07/28/22     metoCLOPramide (REGLAN) 10 MG tablet Take 1 tablet (10 mg total) by mouth every 8 (eight) hours as needed for nausea. 07/11/22   Arrien, York Ram, MD  ondansetron (ZOFRAN-ODT) 4 MG disintegrating tablet Take 1 tablet (4 mg total) by mouth 2 (two) times daily as needed Dissolve on the tongue. 07/22/22     ondansetron (ZOFRAN-ODT) 8 MG disintegrating tablet Take 1 tablet (8 mg total) by mouth every 8 (eight) hours as needed for nausea or vomiting. 09/05/22   Lazaro Arms, MD  oxyCODONE-acetaminophen (PERCOCET) 7.5-325 MG tablet Take 1 tablet by mouth every 6 (six) hours as needed. 09/05/22   Lazaro Arms, MD  pantoprazole (PROTONIX) 40 MG tablet Take 1 tablet (40 mg total) by mouth 2 (two) times daily. 07/04/22     potassium chloride (KLOR-CON) 10 MEQ tablet Take 1 tablet (10 mEq total) by mouth daily. Patient not taking: Reported on 08/29/2022 07/04/22     pravastatin (PRAVACHOL) 20 MG tablet Take 1 tablet (20 mg total) by mouth Nightly for cholesterol Patient taking differently: Take 20 mg by mouth See admin instructions. Takes 4 times during the week (sun-wed) 07/04/22     predniSONE (DELTASONE) 10 MG tablet Directions for 6 day taper: Day 1: 2 tablets before breakfast, 1 after both lunch & dinner and 2 at bedtime Day 2: 1 tab before breakfast, 1 after both lunch & dinner and 2 at bedtime Day 3: 1 tab at each meal & 1 at bedtime Day 4: 1 tab at breakfast, 1 at lunch, 1 at bedtime Day 5: 1 tab at breakfast & 1 tab at bedtime Day 6: 1 tab at breakfast 09/06/22   Claiborne Rigg, NP  Probiotic, Lactobacillus, CAPS Take 1 capsule by mouth daily. 11/28/19   [provider]  pseudoephedrine (SUDAFED) 60 MG tablet Take 60 mg by mouth every 4 (four) hours as needed for congestion.    [provider]  tirzepatide [facility] Keen) 2.5 MG/0.5ML Pen Inject 2.5 mg into the skin once a week. Patient not taking: Reported on 07/23/2022 05/06/22     tirzepatide  [facility]) 5 MG/0.5ML Pen Inject 5 mg into the skin once a week. 07/07/22     tranexamic acid (LYSTEDA) 650 MG TABS tablet Take 2 tablets (1,300 mg total) by mouth 3 (three) times daily. For 5 days Patient not taking: Reported on 07/23/2022 07/08/22   Lazaro Arms, MD  VITAMIN D PO Take 1 tablet by mouth daily.    [provider]      Allergies    Ace inhibitors, Beta adrenergic blockers, Augmentin [amoxicillin-pot clavulanate], Benicar [olmesartan], Doxycycline, Egg-derived products, Influenza vaccine live, Influenza virus vaccine, Invokana [canagliflozin], and Liraglutide    Review of Systems   Review of Systems  Physical Exam Updated Vital Signs BP 113/70   Pulse 85   Temp (!) 97.5 F (36.4 C) (Oral)   Resp (!) 21   Ht 5\' 9"  (1.753 m)   Wt 125.2 kg   LMP 12/12/2021 (Exact Date)   SpO2 95%   BMI 40.76 kg/m  Physical Exam  ED Results / Procedures / Treatments   Labs (all labs ordered are listed, but only abnormal results are displayed) Labs Reviewed  CBC WITH DIFFERENTIAL/PLATELET - Abnormal; Notable for the following components:      Result Value   WBC 11.2 (*)    Eosinophils Absolute 0.6 (*)    All other components within normal limits  COMPREHENSIVE METABOLIC PANEL - Abnormal; Notable for the following components:   Glucose, Bld 236 (*)    Total Protein 6.3 (*)    Albumin 3.3 (*)    All other components within normal limits  TROPONIN I (HIGH SENSITIVITY)  TROPONIN I (HIGH SENSITIVITY)    EKG EKG Interpretation Date/Time:  Sunday September 07 2022 07:31:16 EDT Ventricular Rate:  68 PR Interval:  141 QRS Duration:  83 QT Interval:  396 QTC Calculation: 422 R Axis:   31  Text Interpretation: Sinus rhythm Low voltage, precordial leads Confirmed by Alvester Chou [phone]) on 09/07/2022 9:14:42 AM  Radiology DG Chest Port 1 View  Result Date: 09/07/2022 CLINICAL DATA:  pain, rash post anesthesia and hysterectomy EXAM: PORTABLE CHEST - 1 VIEW COMPARISON:   07/09/2022 FINDINGS: Infrahilar atelectasis left greater than right. Lungs otherwise clear. Heart size and mediastinal contours are within normal limits. No effusion. Visualized bones unremarkable. IMPRESSION: Infrahilar atelectasis. Electronically Signed   By: Corlis Leak M.D.   On: 09/07/2022 08:31    Procedures Procedures  {Document cardiac monitor, telemetry assessment procedure when appropriate:1}  Medications Ordered in ED Medications  methylPREDNISolone sodium succinate (SOLU-MEDROL) 125 mg/2 mL injection 125 mg (125 mg Intravenous Given 09/07/22 0756)  diphenhydrAMINE (BENADRYL) injection 25 mg (25 mg Intravenous Given 09/07/22 0756)  famotidine (PEPCID) IVPB 20 mg premix (0 mg Intravenous Stopped 09/07/22 0826)  sodium chloride 0.9 % bolus 500 mL (500 mLs Intravenous Bolus 09/07/22 0748)  EPINEPHrine (EPI-PEN) injection 0.3 mg (0.3 mg Intramuscular Given 09/07/22 0756)  diphenhydrAMINE (BENADRYL) injection 25 mg (25 mg Intravenous Given 09/07/22 1324)    ED Course/ Medical Decision Making/ A&P   {   Click here for ABCD2, HEART and other calculatorsREFRESH Note before signing :1}                              Medical Decision Making Amount and/or Complexity of Data Reviewed Labs: ordered. Radiology: ordered. ECG/medicine tests: ordered.  Risk Prescription drug management.   Patient with allergic reaction.  She improved somewhat with the epinephrine and Benadryl and steroids.  She will continue with Pepcid steroids Benadryl and follow-up with her PCP  {Document critical care time when appropriate:1} {Document review of labs and clinical decision tools ie heart score, Chads2Vasc2 etc:1}  {Document your independent review of radiology images, and any outside records:1} {Document your discussion with family members, caretakers, and with consultants:1} {Document social determinants of health affecting pt's care:1} {Document your decision making why or why not admission, treatments were  needed:1} Final Clinical Impression(s) / ED Diagnoses Final diagnoses:  Allergic reaction, initial encounter    Rx / DC Orders ED Discharge Orders     None

## 2022-09-08 DIAGNOSIS — G4733 Obstructive sleep apnea (adult) (pediatric): Secondary | ICD-10-CM | POA: Diagnosis not present

## 2022-09-08 DIAGNOSIS — N946 Dysmenorrhea, unspecified: Secondary | ICD-10-CM

## 2022-09-09 ENCOUNTER — Other Ambulatory Visit: Payer: Self-pay

## 2022-09-09 ENCOUNTER — Ambulatory Visit (INDEPENDENT_AMBULATORY_CARE_PROVIDER_SITE_OTHER): Payer: Commercial Managed Care - PPO | Admitting: *Deleted

## 2022-09-09 ENCOUNTER — Ambulatory Visit: Payer: Commercial Managed Care - PPO | Admitting: Internal Medicine

## 2022-09-09 ENCOUNTER — Emergency Department (HOSPITAL_COMMUNITY)
Admission: EM | Admit: 2022-09-09 | Discharge: 2022-09-09 | Disposition: A | Discharge status: Home / Self Care | Payer: Commercial Managed Care - PPO | Attending: Emergency Medicine | Admitting: Emergency Medicine

## 2022-09-09 ENCOUNTER — Other Ambulatory Visit: Payer: Self-pay | Admitting: Obstetrics & Gynecology

## 2022-09-09 ENCOUNTER — Encounter (HOSPITAL_COMMUNITY): Payer: Self-pay | Admitting: Obstetrics & Gynecology

## 2022-09-09 DIAGNOSIS — R21 Rash and other nonspecific skin eruption: Secondary | ICD-10-CM | POA: Diagnosis present

## 2022-09-09 DIAGNOSIS — R7309 Other abnormal glucose: Secondary | ICD-10-CM | POA: Diagnosis not present

## 2022-09-09 DIAGNOSIS — T7840XA Allergy, unspecified, initial encounter: Secondary | ICD-10-CM | POA: Diagnosis not present

## 2022-09-09 LAB — CBG MONITORING, ED: Glucose-Capillary: 235 mg/dL — ABNORMAL HIGH (ref 70–99)

## 2022-09-09 MED ORDER — BETAMETHASONE SOD PHOS & ACET 6 (3-3) MG/ML IJ SUSP
12.0000 mg | Freq: Once | INTRAMUSCULAR | Status: AC
Start: 2022-09-09 — End: 2022-09-09
  Administered 2022-09-09: 12 mg via INTRAMUSCULAR

## 2022-09-09 MED ORDER — PREDNISONE 20 MG PO TABS: ORAL_TABLET | ORAL | 0 refills | Status: DC

## 2022-09-09 MED ORDER — DEXAMETHASONE 6 MG PO TABS: 6.0000 mg | ORAL_TABLET | Freq: Two times a day (BID) | ORAL | 0 refills | Status: DC

## 2022-09-09 MED ORDER — TRIAMCINOLONE ACETONIDE 0.1 % EX CREA: 1.0000 | TOPICAL_CREAM | Freq: Two times a day (BID) | CUTANEOUS | 0 refills | Status: DC

## 2022-09-09 MED ORDER — HYDROXYZINE HCL 50 MG PO TABS: 50.0000 mg | ORAL_TABLET | ORAL | 0 refills | Status: DC | PRN

## 2022-09-09 NOTE — ED Triage Notes (Addendum)
Pt had hysterectomy on Friday, was seen here Sunday for an allergic reaction and received Epi, solumedrol, and benadryl. Pt states the hives have not gotten any better, cannot sleep d/t itching. Has been taking 50mg  of Benadryl and Pepcid at home with no relief

## 2022-09-09 NOTE — ED Provider Notes (Addendum)
[place] EMERGENCY DEPARTMENT AT [facility] Provider Note   CSN: [id number] Arrival date & time: 09/09/22  6283     History  No chief complaint on file.   Cheryl Black is a 37 y.o. female.  HPI 37 year old female presents with hives.  She had a hysterectomy on 8/30.  Had a little bit of a rash on her hairline of her scalp after waking up for surgery.  Has had progressively worsening hives/itching ever since.  A little bit of a cough.  Had a televisit where she was prescribed a 6-day prednisone taper, has taken 2 days worth so far.  Has not yet taken today.  Came to the ER 2 days ago and received Benadryl, Solu-Medrol, epinephrine.  She feels like her arm rash got a little better with the epinephrine and other treatments but her legs and abdomen did not improve.  The itching and burning keeps her up at night and seems to be slowly worsening.  She did note that she had a scopolamine patch that she had put on for surgery which she took off yesterday.  She was given some new antibiotics such as gentamicin and clindamycin during surgery and then was started on ciprofloxacin.  However the rash started before the Cipro.  No trouble breathing, vomiting, diarrhea.  Home Medications Prior to Admission medications   Medication Sig Start Date End Date Taking? Authorizing Provider  acetaminophen (TYLENOL) 500 MG tablet Take 1,000 mg by mouth every 6 (six) hours as needed for moderate pain or headache.    [provider]  albuterol (PROVENTIL) (2.5 MG/3ML) 0.083% nebulizer solution Inhale 3 mLs (2.5 mg total) by nebulization every 2 (two) hours as needed for wheezing or shortness of breath. 03/27/22   Joseph Art, DO  albuterol (VENTOLIN HFA) 108 (90 Base) MCG/ACT inhaler Inhale 2 puffs into the lungs every 4 (four) - 6 (six)  hours as needed for shortness of breath. 01/10/22     allopurinol (ZYLOPRIM) 300 MG tablet Take 1 tablet (300 mg total) by mouth daily. 06/30/22      Azelastine-Fluticasone 137-50 MCG/ACT SUSP Place 1 spray into the nose in the morning and at bedtime. 04/15/22   Charlott Holler, MD  benzonatate (TESSALON PERLES) 100 MG capsule Take 1 capsule (100 mg total) by mouth 3 (three) times daily as needed. 08/23/22   Daphine Deutscher, Mary-Margaret, FNP  BIOTIN PO Take 1 tablet by mouth daily.    [provider]  Blood Glucose Monitoring Suppl (FREESTYLE LITE) w/Device KIT Use to test blood sugar 01/10/22     ciprofloxacin (CIPRO) 500 MG tablet Take 1 tablet (500 mg total) by mouth 2 (two) times daily. 09/05/22   Lazaro Arms, MD  colchicine 0.6 MG tablet Take 2 tablets by mouth then take 1 tablet by mouth 6 hours later as directed. Patient taking differently: Take 0.6 mg by mouth daily as needed (gout). 06/30/22     Continuous Blood Gluc Sensor (DEXCOM G6 SENSOR) MISC Use as directed change every 10 days 06/27/21     Continuous Blood Gluc Transmit (DEXCOM G6 TRANSMITTER) MISC Use as directed every 90 days 12/19/20     Continuous Blood Gluc Transmit (DEXCOM G6 TRANSMITTER) MISC Use as directed with Dexcom sensor, replace every 90 days 02/06/21     Continuous Glucose Sensor (DEXCOM G7 SENSOR) MISC Use as directed for blood glucose monitoring, replace every 10 days. 12/06/21     dapagliflozin propanediol (FARXIGA) 5 MG TABS tablet Take 1  tablet (5 mg total) by mouth daily. 06/06/22     dextromethorphan-guaiFENesin (MUCINEX DM) 30-600 MG 12hr tablet Take 1 tablet by mouth 2 (two) times daily.    [provider]  fluticasone (FLONASE) 50 MCG/ACT nasal spray Place 2 sprays into both nostrils daily. Patient not taking: Reported on 08/29/2022 08/23/22   Bennie Pierini, FNP  fluticasone-salmeterol (ADVAIR HFA) [phone] MCG/ACT inhaler Inhale 2 puffs into the lungs 2 (two) times daily. 04/15/22   Charlott Holler, MD  Glucagon (GVOKE HYPOPEN 2-PACK) 1 MG/0.2ML SOAJ use under the skin for hypoglycemia as needed 01/31/22     glucose blood test strip Use 2-3 times  daily to test blood sugar 01/10/22     hydrochlorothiazide (HYDRODIURIL) 12.5 MG tablet Take 1 tablet (12.5 mg total) by mouth daily for high blood pressure. 07/04/22     Insulin Pen Needle 32G X 4 MM MISC Use as directed to inject insulin 4 times a day 01/10/22     ketorolac (TORADOL) 10 MG tablet Take 1 tablet (10 mg total) by mouth every 8 (eight) hours as needed. 09/05/22   Lazaro Arms, MD  Lancets [facility] DELICA PLUS [place]) MISC Use to test blood sugar 2-3 times daily 01/10/22     loratadine (CLARITIN) 10 MG tablet Take 1 tablet (10 mg total) by mouth daily. 04/15/22   Charlott Holler, MD  metFORMIN (GLUCOPHAGE-XR) 500 MG 24 hr tablet Take 2 tablets (1,000 mg total) by mouth daily. 07/28/22     metoCLOPramide (REGLAN) 10 MG tablet Take 1 tablet (10 mg total) by mouth every 8 (eight) hours as needed for nausea. 07/11/22   Arrien, York Ram, MD  ondansetron (ZOFRAN-ODT) 4 MG disintegrating tablet Take 1 tablet (4 mg total) by mouth 2 (two) times daily as needed Dissolve on the tongue. 07/22/22     ondansetron (ZOFRAN-ODT) 8 MG disintegrating tablet Take 1 tablet (8 mg total) by mouth every 8 (eight) hours as needed for nausea or vomiting. 09/05/22   Lazaro Arms, MD  oxyCODONE-acetaminophen (PERCOCET) 7.5-325 MG tablet Take 1 tablet by mouth every 6 (six) hours as needed. 09/05/22   Lazaro Arms, MD  pantoprazole (PROTONIX) 40 MG tablet Take 1 tablet (40 mg total) by mouth 2 (two) times daily. 07/04/22     potassium chloride (KLOR-CON) 10 MEQ tablet Take 1 tablet (10 mEq total) by mouth daily. Patient not taking: Reported on 08/29/2022 07/04/22     pravastatin (PRAVACHOL) 20 MG tablet Take 1 tablet (20 mg total) by mouth Nightly for cholesterol Patient taking differently: Take 20 mg by mouth See admin instructions. Takes 4 times during the week (sun-wed) 07/04/22     predniSONE (DELTASONE) 10 MG tablet Directions for 6 day taper: Day 1: 2 tablets before breakfast, 1 after both lunch & dinner and 2  at bedtime Day 2: 1 tab before breakfast, 1 after both lunch & dinner and 2 at bedtime Day 3: 1 tab at each meal & 1 at bedtime Day 4: 1 tab at breakfast, 1 at lunch, 1 at bedtime Day 5: 1 tab at breakfast & 1 tab at bedtime Day 6: 1 tab at breakfast 09/06/22   Claiborne Rigg, NP  Probiotic, Lactobacillus, CAPS Take 1 capsule by mouth daily. 11/28/19   [provider]  pseudoephedrine (SUDAFED) 60 MG tablet Take 60 mg by mouth every 4 (four) hours as needed for congestion.    [provider]  tirzepatide [facility]) 2.5 MG/0.5ML Pen Inject 2.5 mg into  the skin once a week. Patient not taking: Reported on 07/23/2022 05/06/22     tirzepatide [facility]) 5 MG/0.5ML Pen Inject 5 mg into the skin once a week. 07/07/22     tranexamic acid (LYSTEDA) 650 MG TABS tablet Take 2 tablets (1,300 mg total) by mouth 3 (three) times daily. For 5 days Patient not taking: Reported on 07/23/2022 07/08/22   Lazaro Arms, MD  VITAMIN D PO Take 1 tablet by mouth daily.    [provider]      Allergies    Ace inhibitors, Beta adrenergic blockers, Augmentin [amoxicillin-pot clavulanate], Benicar [olmesartan], Doxycycline, Egg-derived products, Influenza vaccine live, Influenza virus vaccine, Invokana [canagliflozin], and Liraglutide    Review of Systems   Review of Systems  Respiratory:  Negative for shortness of breath.   Gastrointestinal:  Negative for abdominal pain, diarrhea and vomiting.  Skin:  Positive for rash.    Physical Exam Updated Vital Signs BP 102/63 (BP Location: Left Arm)   Pulse 74   Temp 98.3 F (36.8 C) (Oral)   Resp 11   LMP 12/12/2021 (Exact Date)   SpO2 95%  Physical Exam Vitals and nursing note reviewed.  Constitutional:      General: She is not in acute distress.    Appearance: She is well-developed. She is not ill-appearing or diaphoretic.  HENT:     Head: Normocephalic and atraumatic.  Cardiovascular:     Rate and Rhythm: Normal rate and regular rhythm.      Heart sounds: Normal heart sounds.  Pulmonary:     Effort: Pulmonary effort is normal.     Breath sounds: Normal breath sounds. No stridor. No wheezing.  Abdominal:     Palpations: Abdomen is soft.     Tenderness: There is no abdominal tenderness.  Skin:    General: Skin is warm and dry.     Findings: Rash present.     Comments: Diffuse fine rash, especially to abdomen, proximal thighs, and low back. No facial/lip swelling. Minimal rash on arms and lower legs  Neurological:     Mental Status: She is alert.     ED Results / Procedures / Treatments   Labs (all labs ordered are listed, but only abnormal results are displayed) Labs Reviewed - No data to display  EKG None  Radiology DG Chest [facility] 1 View  Result Date: 09/07/2022 CLINICAL DATA:  pain, rash post anesthesia and hysterectomy EXAM: PORTABLE CHEST - 1 VIEW COMPARISON:  07/09/2022 FINDINGS: Infrahilar atelectasis left greater than right. Lungs otherwise clear. Heart size and mediastinal contours are within normal limits. No effusion. Visualized bones unremarkable. IMPRESSION: Infrahilar atelectasis. Electronically Signed   By: Corlis Leak M.D.   On: 09/07/2022 08:31    Procedures Procedures    Medications Ordered in ED Medications - No data to display  ED Course/ Medical Decision Making/ A&P                                 Medical Decision Making Amount and/or Complexity of Data Reviewed External Data Reviewed: notes.  Risk Prescription drug management.   Patient is overall well-appearing though does have diffuse rash.  We discussed options.  She is currently taking Claritin in addition to 50 mg of Benadryl.  Discussed we could try hydroxyzine instead of the Benadryl but she is already on a newer generation antihistamine.  She is also already taking Pepcid.  Her prednisone dose may  be tapering a little too quickly and so we will change this to a burst.  She understands this can cause her glucose to further  elevate and she will need to continue using insulin which she just recently started back after surgery.  Offered her epinephrine but it did not seem to particularly help symptom wise or resolve the rash last time so we have decided to hold off when talking.  Otherwise we will try topical steroids and recommend she follow-up with an allergist for further evaluation/recommendations.  Otherwise does not seem to have anaphylaxis and is otherwise well-appearing with stable vitals.  Will discharge home with return precautions.        Final Clinical Impression(s) / ED Diagnoses Final diagnoses:  Allergic reaction, initial encounter    Rx / DC Orders ED Discharge Orders     None         Pricilla Loveless, MD 09/09/22 [id number]    Pricilla Loveless, MD 09/09/22 [phone]

## 2022-09-09 NOTE — Progress Notes (Signed)
   NURSE VISIT- INJECTION  SUBJECTIVE:  Cheryl Black is a 37 y.o. G0P0000 female here for a Betamethasone for allergic reaction post surgery. She is a GYN patient.   OBJECTIVE:  LMP 12/12/2021 (Exact Date)   Appears well, in no apparent distress  Injection administered in: Left upper quad. gluteus  Meds ordered this encounter  Medications   betamethasone acetate-betamethasone sodium phosphate (CELESTONE) injection 12 mg    ASSESSMENT: GYN patient Betamethasone for allergic reaction post surgery PLAN: Follow-up: in 1 week for next Volney Presser  09/09/2022 4:42 PM

## 2022-09-09 NOTE — Discharge Instructions (Addendum)
STOP the Benadryl and try Hydroxyzine instead. Your steroid dose is being changed as well. Start the new prescription today.   If you develop trouble breathing, vomiting, new or worsening rash, or any other new/concerning symptoms then return to the ER or call 911.

## 2022-09-11 LAB — SURGICAL PATHOLOGY

## 2022-09-15 ENCOUNTER — Ambulatory Visit (INDEPENDENT_AMBULATORY_CARE_PROVIDER_SITE_OTHER): Payer: Commercial Managed Care - PPO | Admitting: Obstetrics & Gynecology

## 2022-09-15 ENCOUNTER — Encounter: Payer: Self-pay | Admitting: Obstetrics & Gynecology

## 2022-09-15 VITALS — BP 122/90 | HR 76 | Ht 68.0 in | Wt 279.0 lb

## 2022-09-15 DIAGNOSIS — Z9889 Other specified postprocedural states: Secondary | ICD-10-CM

## 2022-09-15 MED ORDER — DEXAMETHASONE 1.5 MG PO TABS: ORAL_TABLET | ORAL | 1 refills | Status: DC

## 2022-09-15 MED ORDER — KETOROLAC TROMETHAMINE 10 MG PO TABS: 10.0000 mg | ORAL_TABLET | Freq: Three times a day (TID) | ORAL | 0 refills | Status: DC | PRN

## 2022-09-15 NOTE — Progress Notes (Signed)
HPI: Patient returns for routine postoperative follow-up having undergone RA TLH BS on 09/05/22.  The patient's immediate postoperative recovery has been unremarkable. Since hospital discharge the patient reports systemic allergic reaction to some drug in surgery, maybe cleocin or gentamicin.   Current Outpatient Medications: acetaminophen (TYLENOL) 500 MG tablet, Take 1,000 mg by mouth every 6 (six) hours as needed for moderate pain or headache., Disp: , Rfl:  albuterol (PROVENTIL) (2.5 MG/3ML) 0.083% nebulizer solution, Inhale 3 mLs (2.5 mg total) by nebulization every 2 (two) hours as needed for wheezing or shortness of breath., Disp: 90 mL, Rfl: 1 albuterol (VENTOLIN HFA) 108 (90 Base) MCG/ACT inhaler, Inhale 2 puffs into the lungs every 4 (four) - 6 (six)  hours as needed for shortness of breath., Disp: 18 g, Rfl: 2 allopurinol (ZYLOPRIM) 300 MG tablet, Take 1 tablet (300 mg total) by mouth daily., Disp: 90 tablet, Rfl: 2 Azelastine-Fluticasone 137-50 MCG/ACT SUSP, Place 1 spray into the nose in the morning and at bedtime., Disp: 23 g, Rfl: 5 BIOTIN PO, Take 1 tablet by mouth daily., Disp: , Rfl:  Blood Glucose Monitoring Suppl (FREESTYLE LITE) w/Device KIT, Use to test blood sugar, Disp: 1 kit, Rfl: 0 colchicine 0.6 MG tablet, Take 2 tablets by mouth then take 1 tablet by mouth 6 hours later as directed. (Patient taking differently: Take 0.6 mg by mouth daily as needed (gout).), Disp: 3 tablet, Rfl: 1 Continuous Blood Gluc Sensor (DEXCOM G6 SENSOR) MISC, Use as directed change every 10 days, Disp: 3 each, Rfl: 7 Continuous Blood Gluc Transmit (DEXCOM G6 TRANSMITTER) MISC, Use as directed every 90 days, Disp: 3 each, Rfl: 6 Continuous Blood Gluc Transmit (DEXCOM G6 TRANSMITTER) MISC, Use as directed with Dexcom sensor, replace every 90 days, Disp: 1 each, Rfl: 3 Continuous Glucose Sensor (DEXCOM G7 SENSOR) MISC, Use as directed for blood glucose monitoring, replace every 10 days., Disp: 3  each, Rfl: 11 dapagliflozin propanediol (FARXIGA) 5 MG TABS tablet, Take 1 tablet (5 mg total) by mouth daily., Disp: 30 tablet, Rfl: 5 dexamethasone (DECADRON) 1.5 MG tablet, 1 twice daily for 7 days the 1/2 twice daily til finished, Disp: 21 tablet, Rfl: 1 dexamethasone (DECADRON) 6 MG tablet, Take 1 tablet (6 mg total) by mouth 2 (two) times daily with a meal., Disp: 14 tablet, Rfl: 0 fluticasone-salmeterol (ADVAIR HFA) 115-21 MCG/ACT inhaler, Inhale 2 puffs into the lungs 2 (two) times daily., Disp: 12 g, Rfl: 5 hydrochlorothiazide (HYDRODIURIL) 12.5 MG tablet, Take 1 tablet (12.5 mg total) by mouth daily for high blood pressure., Disp: 90 tablet, Rfl: 2 hydrOXYzine (ATARAX) 50 MG tablet, Take 1 tablet (50 mg total) by mouth every 4 (four) hours as needed for itching., Disp: 30 tablet, Rfl: 0 insulin lispro (HUMALOG) 100 UNIT/ML injection, Inject 10 Units into the skin 3 (three) times daily before meals. 10-15, Disp: , Rfl:  Insulin Pen Needle 32G X 4 MM MISC, Use as directed to inject insulin 4 times a day, Disp: 100 each, Rfl: 3 ketorolac (TORADOL) 10 MG tablet, Take 1 tablet (10 mg total) by mouth every 8 (eight) hours as needed., Disp: 15 tablet, Rfl: 0 ketorolac (TORADOL) 10 MG tablet, Take 1 tablet (10 mg total) by mouth every 8 (eight) hours as needed., Disp: 15 tablet, Rfl: 0 Lancets (ONETOUCH DELICA PLUS LANCET33G) MISC, Use to test blood sugar 2-3 times daily, Disp: 100 each, Rfl: 3 loratadine (CLARITIN) 10 MG tablet, Take 1 tablet (10 mg total) by mouth daily., Disp: 90 tablet, Rfl: 3 metFORMIN (  GLUCOPHAGE-XR) 500 MG 24 hr tablet, Take 2 tablets (1,000 mg total) by mouth daily., Disp: 180 tablet, Rfl: 1 metoCLOPramide (REGLAN) 10 MG tablet, Take 1 tablet (10 mg total) by mouth every 8 (eight) hours as needed for nausea., Disp: 120 tablet, Rfl: 0 ondansetron (ZOFRAN-ODT) 8 MG disintegrating tablet, Take 1 tablet (8 mg total) by mouth every 8 (eight) hours as needed for nausea or  vomiting., Disp: 8 tablet, Rfl: 0 oxyCODONE-acetaminophen (PERCOCET) 7.5-325 MG tablet, Take 1 tablet by mouth every 6 (six) hours as needed., Disp: 28 tablet, Rfl: 0 pantoprazole (PROTONIX) 40 MG tablet, Take 1 tablet (40 mg total) by mouth 2 (two) times daily., Disp: 180 tablet, Rfl: 2 potassium chloride (KLOR-CON) 10 MEQ tablet, Take 1 tablet (10 mEq total) by mouth daily., Disp: 90 tablet, Rfl: 2 pravastatin (PRAVACHOL) 20 MG tablet, Take 1 tablet (20 mg total) by mouth Nightly for cholesterol (Patient taking differently: Take 20 mg by mouth See admin instructions. Takes 4 times during the week (sun-wed)), Disp: 90 tablet, Rfl: 2 Probiotic, Lactobacillus, CAPS, Take 1 capsule by mouth daily., Disp: , Rfl:  tirzepatide (MOUNJARO) 5 MG/0.5ML Pen, Inject 5 mg into the skin once a week., Disp: 2 mL, Rfl: 3 tranexamic acid (LYSTEDA) 650 MG TABS tablet, Take 2 tablets (1,300 mg total) by mouth 3 (three) times daily. For 5 days, Disp: 30 tablet, Rfl: 0 VITAMIN D PO, Take 1 tablet by mouth daily., Disp: , Rfl:  benzonatate (TESSALON PERLES) 100 MG capsule, Take 1 capsule (100 mg total) by mouth 3 (three) times daily as needed. (Patient not taking: Reported on 09/15/2022), Disp: 20 capsule, Rfl: 0 dextromethorphan-guaiFENesin (MUCINEX DM) 30-600 MG 12hr tablet, Take 1 tablet by mouth 2 (two) times daily. (Patient not taking: Reported on 09/15/2022), Disp: , Rfl:  fluticasone (FLONASE) 50 MCG/ACT nasal spray, Place 2 sprays into both nostrils daily. (Patient not taking: Reported on 08/29/2022), Disp: 16 g, Rfl: 6 Glucagon (GVOKE HYPOPEN 2-PACK) 1 MG/0.2ML SOAJ, use under the skin for hypoglycemia as needed (Patient not taking: Reported on 09/15/2022), Disp: 0.4 mL, Rfl: 1 glucose blood test strip, Use 2-3 times daily to test blood sugar (Patient not taking: Reported on 09/15/2022), Disp: 100 each, Rfl: 1 ondansetron (ZOFRAN-ODT) 4 MG disintegrating tablet, Take 1 tablet (4 mg total) by mouth 2 (two) times daily as  needed Dissolve on the tongue. (Patient not taking: Reported on 09/15/2022), Disp: 14 tablet, Rfl: 0 pseudoephedrine (SUDAFED) 60 MG tablet, Take 60 mg by mouth every 4 (four) hours as needed for congestion. (Patient not taking: Reported on 09/15/2022), Disp: , Rfl:  tirzepatide (MOUNJARO) 2.5 MG/0.5ML Pen, Inject 2.5 mg into the skin once a week. (Patient not taking: Reported on 09/15/2022), Disp: 2 mL, Rfl: 3 triamcinolone cream (KENALOG) 0.1 %, Apply 1 Application topically 2 (two) times daily. Do not use for more than 7 days (Patient not taking: Reported on 09/15/2022), Disp: 453 g, Rfl: 0  No current facility-administered medications for this visit.    Blood pressure (!) 122/90, pulse 76, height 5\' 8"  (1.727 m), weight 279 lb (126.6 kg), last menstrual period 12/12/2021.  Physical Exam: All 4 incisions look good Her rash is dramatically better  Diagnostic Tests:   Pathology: benign  Impression + Management plan:   ICD-10-CM   1. Post-operative state: RA TLH BS 09/05/22  Z98.890    no surgical complications, but severe systemic allergic reaction much better with betamethasone + dexmethasone + ceravie itch lotion  Medications Prescribed this encounter: Orders Placed This Encounter     dexamethasone (DECADRON) 1.5 MG tablet         Sig: 1 twice daily for 7 days the 1/2 twice daily til finished         Dispense:  21 tablet         Refill:  1     ketorolac (TORADOL) 10 MG tablet         Sig: Take 1 tablet (10 mg total) by mouth every 8 (eight) hours as needed.         Dispense:  15 tablet         Refill:  0     Follow up: No follow-ups on file.    Lazaro Arms, MD Attending Physician for the [facility] and [facility] 09/15/2022 10:10 AM

## 2022-09-22 ENCOUNTER — Encounter: Payer: Commercial Managed Care - PPO | Admitting: Obstetrics & Gynecology

## 2022-09-27 ENCOUNTER — Other Ambulatory Visit (HOSPITAL_COMMUNITY): Payer: Self-pay

## 2022-09-29 ENCOUNTER — Encounter: Payer: Self-pay | Admitting: Obstetrics & Gynecology

## 2022-09-29 ENCOUNTER — Other Ambulatory Visit: Payer: Self-pay

## 2022-09-29 ENCOUNTER — Ambulatory Visit (INDEPENDENT_AMBULATORY_CARE_PROVIDER_SITE_OTHER): Payer: Commercial Managed Care - PPO | Admitting: Obstetrics & Gynecology

## 2022-09-29 ENCOUNTER — Other Ambulatory Visit (HOSPITAL_COMMUNITY): Payer: Self-pay

## 2022-09-29 VITALS — BP 116/88 | HR 78 | Ht 68.0 in | Wt 278.0 lb

## 2022-09-29 DIAGNOSIS — Z9889 Other specified postprocedural states: Secondary | ICD-10-CM

## 2022-09-29 MED ORDER — CEPHALEXIN 500 MG PO CAPS
500.0000 mg | ORAL_CAPSULE | Freq: Three times a day (TID) | ORAL | 0 refills | Status: DC
  Filled 2022-09-29: qty 21, 7d supply, fill #0

## 2022-09-29 MED ORDER — INSULIN LISPRO 100 UNIT/ML IJ SOLN
10.0000 [IU] | Freq: Three times a day (TID) | INTRAMUSCULAR | 11 refills | Status: DC
  Filled 2022-09-29: qty 10, 34d supply, fill #0
  Filled 2022-10-27 – 2022-11-07 (×2): qty 10, 34d supply, fill #1

## 2022-09-29 NOTE — Progress Notes (Signed)
HPI: Patient returns for routine postoperative follow-up having undergone RS TLH + BS on 09/05/22.  The patient's immediate postoperative recovery has been unremarkable. Since hospital discharge the patient reports severe allergic reaction.   Current Outpatient Medications: acetaminophen (TYLENOL) 500 MG tablet, Take 1,000 mg by mouth every 6 (six) hours as needed for moderate pain or headache., Disp: , Rfl:  albuterol (PROVENTIL) (2.5 MG/3ML) 0.083% nebulizer solution, Inhale 3 mLs (2.5 mg total) by nebulization every 2 (two) hours as needed for wheezing or shortness of breath., Disp: 90 mL, Rfl: 1 albuterol (VENTOLIN HFA) 108 (90 Base) MCG/ACT inhaler, Inhale 2 puffs into the lungs every 4 (four) - 6 (six)  hours as needed for shortness of breath., Disp: 18 g, Rfl: 2 allopurinol (ZYLOPRIM) 300 MG tablet, Take 1 tablet (300 mg total) by mouth daily., Disp: 90 tablet, Rfl: 2 Azelastine-Fluticasone 137-50 MCG/ACT SUSP, Place 1 spray into the nose in the morning and at bedtime., Disp: 23 g, Rfl: 5 BIOTIN PO, Take 1 tablet by mouth daily., Disp: , Rfl:  Blood Glucose Monitoring Suppl (FREESTYLE LITE) w/Device KIT, Use to test blood sugar, Disp: 1 kit, Rfl: 0 cephALEXin (KEFLEX) 500 MG capsule, Take 1 capsule (500 mg total) by mouth 3 (three) times daily., Disp: 21 capsule, Rfl: 0 colchicine 0.6 MG tablet, Take 2 tablets by mouth then take 1 tablet by mouth 6 hours later as directed. (Patient taking differently: Take 0.6 mg by mouth daily as needed (gout).), Disp: 3 tablet, Rfl: 1 Continuous Blood Gluc Sensor (DEXCOM G6 SENSOR) MISC, Use as directed change every 10 days, Disp: 3 each, Rfl: 7 Continuous Blood Gluc Transmit (DEXCOM G6 TRANSMITTER) MISC, Use as directed every 90 days, Disp: 3 each, Rfl: 6 Continuous Blood Gluc Transmit (DEXCOM G6 TRANSMITTER) MISC, Use as directed with Dexcom sensor, replace every 90 days, Disp: 1 each, Rfl: 3 Continuous Glucose Sensor (DEXCOM G7 SENSOR) MISC, Use as  directed for blood glucose monitoring, replace every 10 days., Disp: 3 each, Rfl: 11 dapagliflozin propanediol (FARXIGA) 5 MG TABS tablet, Take 1 tablet (5 mg total) by mouth daily., Disp: 30 tablet, Rfl: 5 dexamethasone (DECADRON) 1.5 MG tablet, 1 twice daily for 7 days the 1/2 twice daily til finished, Disp: 21 tablet, Rfl: 1 dexamethasone (DECADRON) 6 MG tablet, Take 1 tablet (6 mg total) by mouth 2 (two) times daily with a meal., Disp: 14 tablet, Rfl: 0 hydrochlorothiazide (HYDRODIURIL) 12.5 MG tablet, Take 1 tablet (12.5 mg total) by mouth daily for high blood pressure., Disp: 90 tablet, Rfl: 2 ketorolac (TORADOL) 10 MG tablet, Take 1 tablet (10 mg total) by mouth every 8 (eight) hours as needed., Disp: 15 tablet, Rfl: 0 ketorolac (TORADOL) 10 MG tablet, Take 1 tablet (10 mg total) by mouth every 8 (eight) hours as needed., Disp: 15 tablet, Rfl: 0 Lancets (ONETOUCH DELICA PLUS LANCET33G) MISC, Use to test blood sugar 2-3 times daily, Disp: 100 each, Rfl: 3 loratadine (CLARITIN) 10 MG tablet, Take 1 tablet (10 mg total) by mouth daily., Disp: 90 tablet, Rfl: 3 metFORMIN (GLUCOPHAGE-XR) 500 MG 24 hr tablet, Take 2 tablets (1,000 mg total) by mouth daily., Disp: 180 tablet, Rfl: 1 metoCLOPramide (REGLAN) 10 MG tablet, Take 1 tablet (10 mg total) by mouth every 8 (eight) hours as needed for nausea., Disp: 120 tablet, Rfl: 0 ondansetron (ZOFRAN-ODT) 4 MG disintegrating tablet, Take 1 tablet (4 mg total) by mouth 2 (two) times daily as needed Dissolve on the tongue., Disp: 14 tablet, Rfl: 0 ondansetron (ZOFRAN-ODT) 8 MG  disintegrating tablet, Take 1 tablet (8 mg total) by mouth every 8 (eight) hours as needed for nausea or vomiting., Disp: 8 tablet, Rfl: 0 oxyCODONE-acetaminophen (PERCOCET) 7.5-325 MG tablet, Take 1 tablet by mouth every 6 (six) hours as needed., Disp: 28 tablet, Rfl: 0 pantoprazole (PROTONIX) 40 MG tablet, Take 1 tablet (40 mg total) by mouth 2 (two) times daily., Disp: 180 tablet,  Rfl: 2 potassium chloride (KLOR-CON) 10 MEQ tablet, Take 1 tablet (10 mEq total) by mouth daily., Disp: 90 tablet, Rfl: 2 pravastatin (PRAVACHOL) 20 MG tablet, Take 1 tablet (20 mg total) by mouth Nightly for cholesterol (Patient taking differently: Take 20 mg by mouth See admin instructions. Takes 4 times during the week (sun-wed)), Disp: 90 tablet, Rfl: 2 Probiotic, Lactobacillus, CAPS, Take 1 capsule by mouth daily., Disp: , Rfl:  tirzepatide (MOUNJARO) 5 MG/0.5ML Pen, Inject 5 mg into the skin once a week., Disp: 2 mL, Rfl: 3 VITAMIN D PO, Take 1 tablet by mouth daily., Disp: , Rfl:  Glucagon (GVOKE HYPOPEN 2-PACK) 1 MG/0.2ML SOAJ, use under the skin for hypoglycemia as needed (Patient not taking: Reported on 09/15/2022), Disp: 0.4 mL, Rfl: 1 glucose blood test strip, Use 2-3 times daily to test blood sugar (Patient not taking: Reported on 09/15/2022), Disp: 100 each, Rfl: 1 hydrOXYzine (ATARAX) 50 MG tablet, Take 1 tablet (50 mg total) by mouth every 4 (four) hours as needed for itching. (Patient not taking: Reported on 09/29/2022), Disp: 30 tablet, Rfl: 0 insulin lispro (HUMALOG) 100 UNIT/ML injection, Inject 0.1 mLs (10 Units total) into the skin 3 (three) times daily before meals. 10-15, Disp: 10 mL, Rfl: 11 Insulin Pen Needle 32G X 4 MM MISC, Use as directed to inject insulin 4 times a day, Disp: 100 each, Rfl: 3 pseudoephedrine (SUDAFED) 60 MG tablet, Take 60 mg by mouth every 4 (four) hours as needed for congestion. (Patient not taking: Reported on 09/15/2022), Disp: , Rfl:  tirzepatide (MOUNJARO) 2.5 MG/0.5ML Pen, Inject 2.5 mg into the skin once a week. (Patient not taking: Reported on 09/15/2022), Disp: 2 mL, Rfl: 3 triamcinolone cream (KENALOG) 0.1 %, Apply 1 Application topically 2 (two) times daily. Do not use for more than 7 days (Patient not taking: Reported on 09/15/2022), Disp: 453 g, Rfl: 0  No current facility-administered medications for this visit.    Blood pressure 116/88, pulse 78,  height 5\' 8"  (1.727 m), weight 278 lb (126.1 kg), last menstrual period 12/12/2021.  Physical Exam: All 4 incisions looks good She is having more pain around the right lateral one but no evidence of cellulitis But with being on so much dexamethasone and being poorly controlled diabetes will cover with keflex  Diagnostic Tests:   Pathology: benign  Impression + Management plan: (L24.401) Post-operative state: RA TLH BS 09/05/22  (primary encounter diagnosis)     Medications Prescribed this encounter: Orders Placed This Encounter     cephALEXin (KEFLEX) 500 MG capsule         Sig: Take 1 capsule (500 mg total) by mouth 3 (three) times daily.         Dispense:  21 capsule         Refill:  0     insulin lispro (HUMALOG) 100 UNIT/ML injection         Sig: Inject 0.1 mLs (10 Units total) into the skin 3 (three) times daily before meals. 10-15         Dispense:  10 mL  Refill:  11     Follow up: No follow-ups on file.    Lazaro Arms, MD Attending Physician for the Center for [facility] 09/29/2022 11:24 AM

## 2022-09-30 ENCOUNTER — Other Ambulatory Visit: Payer: Self-pay

## 2022-09-30 ENCOUNTER — Encounter: Payer: Self-pay | Admitting: *Deleted

## 2022-10-02 ENCOUNTER — Other Ambulatory Visit (HOSPITAL_COMMUNITY): Payer: Self-pay

## 2022-10-02 ENCOUNTER — Other Ambulatory Visit: Payer: Self-pay

## 2022-10-04 ENCOUNTER — Encounter: Payer: Self-pay | Admitting: Obstetrics & Gynecology

## 2022-10-05 ENCOUNTER — Other Ambulatory Visit (HOSPITAL_COMMUNITY): Payer: Self-pay

## 2022-10-07 ENCOUNTER — Other Ambulatory Visit: Payer: Self-pay

## 2022-10-07 MED ORDER — SILVER SULFADIAZINE 1 % EX CREA
TOPICAL_CREAM | CUTANEOUS | 11 refills | Status: DC
  Filled 2022-10-07: qty 50, 30d supply, fill #0
  Filled 2022-10-30: qty 50, 30d supply, fill #1

## 2022-10-08 DIAGNOSIS — G4733 Obstructive sleep apnea (adult) (pediatric): Secondary | ICD-10-CM | POA: Diagnosis not present

## 2022-10-15 ENCOUNTER — Other Ambulatory Visit: Payer: Self-pay

## 2022-10-15 ENCOUNTER — Ambulatory Visit (INDEPENDENT_AMBULATORY_CARE_PROVIDER_SITE_OTHER): Payer: Commercial Managed Care - PPO | Admitting: Obstetrics & Gynecology

## 2022-10-15 ENCOUNTER — Encounter: Payer: Self-pay | Admitting: Obstetrics & Gynecology

## 2022-10-15 VITALS — BP 122/88 | HR 109 | Wt 276.0 lb

## 2022-10-15 DIAGNOSIS — Z9889 Other specified postprocedural states: Secondary | ICD-10-CM

## 2022-10-15 MED ORDER — CIPROFLOXACIN HCL 500 MG PO TABS
500.0000 mg | ORAL_TABLET | Freq: Two times a day (BID) | ORAL | 0 refills | Status: DC
  Filled 2022-10-15 – 2022-10-17 (×3): qty 14, 7d supply, fill #0

## 2022-10-15 MED ORDER — METRONIDAZOLE 500 MG PO TABS
500.0000 mg | ORAL_TABLET | Freq: Two times a day (BID) | ORAL | 0 refills | Status: DC
Start: 2022-10-15 — End: 2022-10-24
  Filled 2022-10-15 – 2022-10-17 (×3): qty 14, 7d supply, fill #0

## 2022-10-15 NOTE — Progress Notes (Unsigned)
HPI: Patient returns for routine postoperative follow-up having undergone *** on ***.  The patient's immediate postoperative recovery has been unremarkable. Since hospital discharge the patient reports ***.   Current Outpatient Medications: acetaminophen (TYLENOL) 500 MG tablet, Take 1,000 mg by mouth every 6 (six) hours as needed for moderate pain or headache., Disp: , Rfl:  albuterol (PROVENTIL) (2.5 MG/3ML) 0.083% nebulizer solution, Inhale 3 mLs (2.5 mg total) by nebulization every 2 (two) hours as needed for wheezing or shortness of breath., Disp: 90 mL, Rfl: 1 albuterol (VENTOLIN HFA) 108 (90 Base) MCG/ACT inhaler, Inhale 2 puffs into the lungs every 4 (four) - 6 (six)  hours as needed for shortness of breath., Disp: 18 g, Rfl: 2 allopurinol (ZYLOPRIM) 300 MG tablet, Take 1 tablet (300 mg total) by mouth daily., Disp: 90 tablet, Rfl: 2 Azelastine-Fluticasone 137-50 MCG/ACT SUSP, Place 1 spray into the nose in the morning and at bedtime., Disp: 23 g, Rfl: 5 BIOTIN PO, Take 1 tablet by mouth daily., Disp: , Rfl:  ciprofloxacin (CIPRO) 500 MG tablet, Take 1 tablet (500 mg total) by mouth 2 (two) times daily., Disp: 14 tablet, Rfl: 0 colchicine 0.6 MG tablet, Take 2 tablets by mouth then take 1 tablet by mouth 6 hours later as directed. (Patient taking differently: Take 0.6 mg by mouth daily as needed (gout).), Disp: 3 tablet, Rfl: 1 Continuous Glucose Sensor (DEXCOM G7 SENSOR) MISC, Use as directed for blood glucose monitoring, replace every 10 days., Disp: 3 each, Rfl: 11 dapagliflozin propanediol (FARXIGA) 5 MG TABS tablet, Take 1 tablet (5 mg total) by mouth daily., Disp: 30 tablet, Rfl: 5 hydrochlorothiazide (HYDRODIURIL) 12.5 MG tablet, Take 1 tablet (12.5 mg total) by mouth daily for high blood pressure., Disp: 90 tablet, Rfl: 2 insulin lispro (HUMALOG) 100 UNIT/ML injection, Inject 0.1 mLs (10 Units total) into the skin 3 (three) times daily before meals. 10-15, Disp: 10 mL, Rfl:  11 Insulin Pen Needle 32G X 4 MM MISC, Use as directed to inject insulin 4 times a day, Disp: 100 each, Rfl: 3 Lancets (ONETOUCH DELICA PLUS LANCET33G) MISC, Use to test blood sugar 2-3 times daily, Disp: 100 each, Rfl: 3 loratadine (CLARITIN) 10 MG tablet, Take 1 tablet (10 mg total) by mouth daily., Disp: 90 tablet, Rfl: 3 metFORMIN (GLUCOPHAGE-XR) 500 MG 24 hr tablet, Take 2 tablets (1,000 mg total) by mouth daily., Disp: 180 tablet, Rfl: 1 metroNIDAZOLE (FLAGYL) 500 MG tablet, Take 1 tablet (500 mg total) by mouth 2 (two) times daily., Disp: 14 tablet, Rfl: 0 oxyCODONE-acetaminophen (PERCOCET) 7.5-325 MG tablet, Take 1 tablet by mouth every 6 (six) hours as needed., Disp: 28 tablet, Rfl: 0 pantoprazole (PROTONIX) 40 MG tablet, Take 1 tablet (40 mg total) by mouth 2 (two) times daily., Disp: 180 tablet, Rfl: 2 potassium chloride (KLOR-CON) 10 MEQ tablet, Take 1 tablet (10 mEq total) by mouth daily., Disp: 90 tablet, Rfl: 2 pravastatin (PRAVACHOL) 20 MG tablet, Take 1 tablet (20 mg total) by mouth Nightly for cholesterol (Patient taking differently: Take 20 mg by mouth See admin instructions. Takes 4 times during the week (sun-wed)), Disp: 90 tablet, Rfl: 2 Probiotic, Lactobacillus, CAPS, Take 1 capsule by mouth daily., Disp: , Rfl:  silver sulfADIAZINE (SILVADENE) 1 % cream, Use to area three times daily, Disp: 50 g, Rfl: 11 tirzepatide (MOUNJARO) 5 MG/0.5ML Pen, Inject 5 mg into the skin once a week., Disp: 2 mL, Rfl: 3 VITAMIN D PO, Take 1 tablet by mouth daily., Disp: , Rfl:  Blood Glucose Monitoring  Suppl (FREESTYLE LITE) w/Device KIT, Use to test blood sugar, Disp: 1 kit, Rfl: 0 Continuous Blood Gluc Sensor (DEXCOM G6 SENSOR) MISC, Use as directed change every 10 days, Disp: 3 each, Rfl: 7 Continuous Blood Gluc Transmit (DEXCOM G6 TRANSMITTER) MISC, Use as directed every 90 days, Disp: 3 each, Rfl: 6 Continuous Blood Gluc Transmit (DEXCOM G6 TRANSMITTER) MISC, Use as directed with Dexcom  sensor, replace every 90 days, Disp: 1 each, Rfl: 3 dexamethasone (DECADRON) 1.5 MG tablet, 1 twice daily for 7 days the 1/2 twice daily til finished, Disp: 21 tablet, Rfl: 1 dexamethasone (DECADRON) 6 MG tablet, Take 1 tablet (6 mg total) by mouth 2 (two) times daily with a meal., Disp: 14 tablet, Rfl: 0 Glucagon (GVOKE HYPOPEN 2-PACK) 1 MG/0.2ML SOAJ, use under the skin for hypoglycemia as needed (Patient not taking: Reported on 09/15/2022), Disp: 0.4 mL, Rfl: 1 glucose blood test strip, Use 2-3 times daily to test blood sugar (Patient not taking: Reported on 09/15/2022), Disp: 100 each, Rfl: 1 hydrOXYzine (ATARAX) 50 MG tablet, Take 1 tablet (50 mg total) by mouth every 4 (four) hours as needed for itching. (Patient not taking: Reported on 09/29/2022), Disp: 30 tablet, Rfl: 0 ketorolac (TORADOL) 10 MG tablet, Take 1 tablet (10 mg total) by mouth every 8 (eight) hours as needed. (Patient not taking: Reported on 10/15/2022), Disp: 15 tablet, Rfl: 0 ketorolac (TORADOL) 10 MG tablet, Take 1 tablet (10 mg total) by mouth every 8 (eight) hours as needed. (Patient not taking: Reported on 10/15/2022), Disp: 15 tablet, Rfl: 0 metoCLOPramide (REGLAN) 10 MG tablet, Take 1 tablet (10 mg total) by mouth every 8 (eight) hours as needed for nausea. (Patient not taking: Reported on 10/15/2022), Disp: 120 tablet, Rfl: 0 tirzepatide (MOUNJARO) 2.5 MG/0.5ML Pen, Inject 2.5 mg into the skin once a week. (Patient not taking: Reported on 09/15/2022), Disp: 2 mL, Rfl: 3 triamcinolone cream (KENALOG) 0.1 %, Apply 1 Application topically 2 (two) times daily. Do not use for more than 7 days (Patient not taking: Reported on 09/15/2022), Disp: 453 g, Rfl: 0  No current facility-administered medications for this visit.    Blood pressure 122/88, pulse (!) 109, weight 276 lb (125.2 kg), last menstrual period 12/12/2021.  Physical Exam: ***  Diagnostic Tests: ***  Pathology: ***  Impression + Management plan: No diagnosis  found.    Medications Prescribed this encounter: Orders Placed This Encounter     ciprofloxacin (CIPRO) 500 MG tablet         Sig: Take 1 tablet (500 mg total) by mouth 2 (two) times daily.         Dispense:  14 tablet         Refill:  0     metroNIDAZOLE (FLAGYL) 500 MG tablet         Sig: Take 1 tablet (500 mg total) by mouth 2 (two) times daily.         Dispense:  14 tablet         Refill:  0     Follow up: Return in about 9 days (around 10/24/2022) for Follow up, with Dr Despina Hidden.    Lazaro Arms, MD Attending Physician for the Center for [facility] 10/15/2022 1:46 PM

## 2022-10-17 ENCOUNTER — Other Ambulatory Visit: Payer: Self-pay

## 2022-10-17 ENCOUNTER — Other Ambulatory Visit (HOSPITAL_COMMUNITY): Payer: Self-pay

## 2022-10-18 ENCOUNTER — Other Ambulatory Visit (HOSPITAL_COMMUNITY): Payer: Self-pay

## 2022-10-20 ENCOUNTER — Other Ambulatory Visit: Payer: Self-pay

## 2022-10-21 ENCOUNTER — Encounter: Payer: Self-pay | Admitting: *Deleted

## 2022-10-21 ENCOUNTER — Other Ambulatory Visit (HOSPITAL_COMMUNITY): Payer: Self-pay

## 2022-10-22 ENCOUNTER — Other Ambulatory Visit (HOSPITAL_COMMUNITY): Payer: Self-pay

## 2022-10-22 ENCOUNTER — Other Ambulatory Visit: Payer: Self-pay

## 2022-10-22 MED ORDER — COLCHICINE 0.6 MG PO TABS
ORAL_TABLET | ORAL | 1 refills | Status: DC
  Filled 2022-10-22: qty 3, 1d supply, fill #0
  Filled 2022-10-24 – 2022-11-26 (×4): qty 3, 1d supply, fill #1

## 2022-10-23 ENCOUNTER — Other Ambulatory Visit (HOSPITAL_COMMUNITY): Payer: Self-pay

## 2022-10-23 ENCOUNTER — Encounter: Payer: Self-pay | Admitting: Internal Medicine

## 2022-10-23 ENCOUNTER — Ambulatory Visit: Payer: Commercial Managed Care - PPO | Admitting: Internal Medicine

## 2022-10-23 VITALS — BP 120/80 | HR 104 | Ht 68.0 in | Wt 274.0 lb

## 2022-10-23 DIAGNOSIS — J4550 Severe persistent asthma, uncomplicated: Secondary | ICD-10-CM | POA: Diagnosis not present

## 2022-10-23 DIAGNOSIS — K219 Gastro-esophageal reflux disease without esophagitis: Secondary | ICD-10-CM | POA: Diagnosis not present

## 2022-10-23 DIAGNOSIS — J301 Allergic rhinitis due to pollen: Secondary | ICD-10-CM | POA: Diagnosis not present

## 2022-10-23 NOTE — Progress Notes (Signed)
Cheryl Black    [id number]    1985/02/10  Primary Care Physician:Hall, Kathleene Hazel, MD Date of Appointment: 10/23/2022 Established Patient Visit  Chief complaint:   Chief Complaint  Patient presents with   Follow-up    Pt is here for Asthma F/U visit.      HPI: Cheryl Black is a 37 y.o. woman with severe persistent asthma  Interval Updates: Here for follow up. Breathing much better on advair, no side effects.   Had hysterectomy and bleeding complications this summer.  Was put on prednisone for allergic reaction to antibiotic after hysterectomy this summer.  No issues with her breathing. Hasn't been able to use CPAP due to having bad leak. Has tried changing her mask and still having issues.  Current Regimen: advair, No prn albuterol use.  Asthma Triggers: allergies, seasonal, exertion Exacerbations in the last year: History of hospitalization or intubation:  Allergy Testing/rhinitis: Using claritin, dymista.  GERD: yes on ppi ACT:      No data to display         FeNO: 24 ppb  Serum Eos/IgE:          I have reviewed the patient's family social and past medical history and updated as appropriate.   Past Medical History:  Diagnosis Date   Acid reflux    Asthma    Clostridium difficile infection    in the past   Diabetes mellitus    Fatty liver    Fibrocystic breast disease    Gout    Hidradenitis    History of kidney stones    Hyperlipemia    Obesity    Polycystic ovary syndrome    PONV (postoperative nausea and vomiting)    Respiratory failure (HCC)    "2018 and 2019"   Sleep apnea    Smoker     Past Surgical History:  Procedure Laterality Date   CHOLECYSTECTOMY  01/10/2011   Procedure: LAPAROSCOPIC CHOLECYSTECTOMY;  Surgeon: Dalia Heading;  Location: [facility];  Service: General;  Laterality: N/A;   ESOPHAGOGASTRODUODENOSCOPY (EGD) WITH PROPOFOL N/A 09/09/2018   RMR: Mild erosive reflux esophagitis.  Esophagus dilated for history of  dysphagia.   HYDRADENITIS EXCISION Right 06/18/2012   Procedure: EXCISION HYDRIADENITIS SUPRATIVA  RIGHT AXILLA;  Surgeon: Marlane Hatcher, MD;  Location: [facility];  Service: General;  Laterality: Right;   LIVER BIOPSY  01/10/2011   Procedure: LIVER BIOPSY;  Surgeon: Dalia Heading;  Location: [facility];  Service: General;;   MALONEY DILATION N/A 09/09/2018   Procedure: Elease Hashimoto DILATION;  Surgeon: Corbin Ade, MD;  Location: [facility];  Service: Endoscopy;  Laterality: N/A;   ROBOTIC ASSISTED TOTAL HYSTERECTOMY WITH BILATERAL SALPINGO OOPHERECTOMY Bilateral 09/05/2022   Procedure: XI ROBOTIC ASSISTED TOTAL HYSTERECTOMY WITH BILATERAL SALPINGO;  Surgeon: Lazaro Arms, MD;  Location: [facility];  Service: Gynecology;  Laterality: Bilateral;   SKIN SPLIT GRAFT Right 06/18/2012   Procedure: SKIN GRAFT SPLIT THICKNESS RIGHT AXILLA(WITH TWO STANDARD SKIN BOARDS 3"x 8" )  DONOR SITE RIGHT AND LEFT THIGHS;  Surgeon: Marlane Hatcher, MD;  Location: [facility];  Service: General;  Laterality: Right;    Family History  Problem Relation Age of Onset   Other Mother        pre-cancerous cells; had hyst   Emphysema Mother        smoker   COPD Mother    Diabetes Father    Hypertension Father  Cancer Sister        cervical; had hyst   Gout Brother    Hypertension Maternal Grandmother    Heart attack Maternal Grandfather    Cancer Maternal Grandfather        adenocarcinoma   Emphysema Maternal Grandfather        smoked   Hypertension Paternal Grandmother    Diabetes Paternal Grandmother    Gout Paternal Grandmother    Asthma Maternal Uncle    Stroke Other        paternal great grandfather   Colon cancer Neg Hx    Liver disease Neg Hx    Liver cancer Neg Hx     Social History   Occupational History   Occupation: Engineer, civil (consulting)  Tobacco Use   Smoking status: Former    Current packs/day: 0.00    Average packs/day: 1 pack/day for 10.0 years (10.0 ttl pk-yrs)    Types: Cigarettes    Start date:  01/07/2008    Quit date: 01/06/2018    Years since quitting: 4.7    Passive exposure: Current   Smokeless tobacco: Never  Vaping Use   Vaping status: Never Used  Substance and Sexual Activity   Alcohol use: Not Currently    Comment: occas   Drug use: No   Sexual activity: Yes    Birth control/protection: None     Physical Exam: Blood pressure 120/80, pulse (!) 104, height 5\' 8"  (1.727 m), weight 274 lb (124.3 kg), last menstrual period 12/12/2021, SpO2 96%.  Gen:      No acute distress ENT:  no nasal polyps, mucus membranes moist Lungs:    No increased respiratory effort, symmetric chest wall excursion, clear to auscultation bilaterally, no wheezes or crackles CV:         Regular rate and rhythm; no murmurs, rubs, or gallops.  No pedal edema   Data Reviewed: Imaging:   PFTs:     Latest Ref Rng & Units 09/23/2017    8:53 AM  PFT Results  FVC-Pre L 4.19   FVC-Predicted Pre % 98   FVC-Post L 4.43   FVC-Predicted Post % 104   Pre FEV1/FVC % % 84   Post FEV1/FCV % % 85   FEV1-Pre L 3.54   FEV1-Predicted Pre % 100   FEV1-Post L 3.78   DLCO uncorrected ml/min/mmHg 29.19   DLCO UNC% % 98   DLVA Predicted % 99   TLC L 6.60   TLC % Predicted % 116   RV % Predicted % 141    I have personally reviewed the patient's PFTs and normal pft  Labs: Lab Results  Component Value Date   WBC 11.2 (H) 09/07/2022   HGB 14.9 09/07/2022   HCT 44.5 09/07/2022   MCV 89.7 09/07/2022   PLT 289 09/07/2022   Lab Results  Component Value Date   NA 137 09/07/2022   K 3.6 09/07/2022   CL 103 09/07/2022   CO2 25 09/07/2022    Immunization status: Immunization History  Administered Date(s) Administered   [employer] Vaccination 02/01/2019, 03/01/2019   Pneumococcal Polysaccharide-23 08/26/2017    External Records Personally Reviewed: hospital stay, ED  Assessment:  Severe persistent asthma,improved control Allergic rhinitis, improved control GERD, controlled OSA on  CPAP  Plan/Recommendations: Glad your breathing is doing better!  Continue the advair 2 puffs twice daily, gargle after use.  Continue albuterol inhaler as needed.   Please get your flu shot before end of October if possible.   Continue  the medication for acid reflux.  Continue claritin, dymista for nasal inflammation Hope we can get your cpap mask sorted out.  Call me sooner if needed.    Return to Care: Return in about 6 months (around 04/23/2023).   Durel Salts, MD Pulmonary and Critical Care Medicine [facility] [place] [phone]

## 2022-10-23 NOTE — Patient Instructions (Addendum)
It was a pleasure to see you today!  Please schedule follow up scheduled with myself in 6 months.  If my schedule is not open yet, we will contact you with a reminder closer to that time. Please call [phone] if you haven't heard from [name] a month before, and always call us sooner if issues or concerns arise. You can also send [name] a message through MyChart, but but aware that this is not to be used for urgent issues and it may take up to 5-7 days to receive a reply. Please be aware that you will likely be able to view your results before I have a chance to respond to them. Please give [name] 5 business days to respond to any non-urgent results.    Glad your breathing is doing better!  Continue the advair 2 puffs twice daily, gargle after use.  Continue albuterol inhaler as needed.   Please get your flu shot before end of October if possible.   Continue the medication for acid reflux.  Continue claritin, dymista for nasal inflammation  Call me sooner if needed.   Hope we can get your cpap mask sorted out.

## 2022-10-24 ENCOUNTER — Ambulatory Visit: Payer: Commercial Managed Care - PPO | Admitting: Obstetrics & Gynecology

## 2022-10-24 ENCOUNTER — Encounter: Payer: Self-pay | Admitting: Obstetrics & Gynecology

## 2022-10-24 VITALS — BP 126/89 | HR 85 | Ht 68.0 in | Wt 275.0 lb

## 2022-10-24 DIAGNOSIS — Z9889 Other specified postprocedural states: Secondary | ICD-10-CM

## 2022-10-24 NOTE — Progress Notes (Signed)
HPI: Patient returns for routine postoperative follow-up having undergone RA TLH BS on 09/05/22.  The patient's immediate postoperative recovery has been unremarkable. Since hospital discharge the patient reports doing much better.   Current Outpatient Medications: acetaminophen (TYLENOL) 500 MG tablet, Take 1,000 mg by mouth every 6 (six) hours as needed for moderate pain or headache., Disp: , Rfl:  albuterol (PROVENTIL) (2.5 MG/3ML) 0.083% nebulizer solution, Inhale 3 mLs (2.5 mg total) by nebulization every 2 (two) hours as needed for wheezing or shortness of breath., Disp: 90 mL, Rfl: 1 albuterol (VENTOLIN HFA) 108 (90 Base) MCG/ACT inhaler, Inhale 2 puffs into the lungs every 4 (four) - 6 (six)  hours as needed for shortness of breath., Disp: 18 g, Rfl: 2 allopurinol (ZYLOPRIM) 300 MG tablet, Take 1 tablet (300 mg total) by mouth daily., Disp: 90 tablet, Rfl: 2 Azelastine-Fluticasone 137-50 MCG/ACT SUSP, Place 1 spray into the nose in the morning and at bedtime., Disp: 23 g, Rfl: 5 BIOTIN PO, Take 1 tablet by mouth daily., Disp: , Rfl:  Blood Glucose Monitoring Suppl (FREESTYLE LITE) w/Device KIT, Use to test blood sugar, Disp: 1 kit, Rfl: 0 colchicine 0.6 MG tablet, Take 2 tablets by mouth now, then take 1 tablet by mouth 6 hours later as directed., Disp: 3 tablet, Rfl: 1 Continuous Glucose Sensor (DEXCOM G7 SENSOR) MISC, Use as directed for blood glucose monitoring, replace every 10 days., Disp: 3 each, Rfl: 11 dapagliflozin propanediol (FARXIGA) 5 MG TABS tablet, Take 1 tablet (5 mg total) by mouth daily., Disp: 30 tablet, Rfl: 5 Glucagon (GVOKE HYPOPEN 2-PACK) 1 MG/0.2ML SOAJ, use under the skin for hypoglycemia as needed, Disp: 0.4 mL, Rfl: 1 glucose blood test strip, Use 2-3 times daily to test blood sugar, Disp: 100 each, Rfl: 1 hydrochlorothiazide (HYDRODIURIL) 12.5 MG tablet, Take 1 tablet (12.5 mg total) by mouth daily for high blood pressure., Disp: 90 tablet, Rfl: 2 insulin  lispro (HUMALOG) 100 UNIT/ML injection, Inject 0.1 mLs (10 Units total) into the skin 3 (three) times daily before meals. 10-15, Disp: 10 mL, Rfl: 11 Insulin Pen Needle 32G X 4 MM MISC, Use as directed to inject insulin 4 times a day, Disp: 100 each, Rfl: 3 Lancets (ONETOUCH DELICA PLUS LANCET33G) MISC, Use to test blood sugar 2-3 times daily, Disp: 100 each, Rfl: 3 loratadine (CLARITIN) 10 MG tablet, Take 1 tablet (10 mg total) by mouth daily., Disp: 90 tablet, Rfl: 3 metFORMIN (GLUCOPHAGE-XR) 500 MG 24 hr tablet, Take 2 tablets (1,000 mg total) by mouth daily., Disp: 180 tablet, Rfl: 1 metoCLOPramide (REGLAN) 10 MG tablet, Take 1 tablet (10 mg total) by mouth every 8 (eight) hours as needed for nausea., Disp: 120 tablet, Rfl: 0 pantoprazole (PROTONIX) 40 MG tablet, Take 1 tablet (40 mg total) by mouth 2 (two) times daily., Disp: 180 tablet, Rfl: 2 potassium chloride (KLOR-CON) 10 MEQ tablet, Take 1 tablet (10 mEq total) by mouth daily., Disp: 90 tablet, Rfl: 2 pravastatin (PRAVACHOL) 20 MG tablet, Take 1 tablet (20 mg total) by mouth Nightly for cholesterol (Patient taking differently: Take 20 mg by mouth See admin instructions. Takes 4 times during the week (sun-wed)), Disp: 90 tablet, Rfl: 2 Probiotic, Lactobacillus, CAPS, Take 1 capsule by mouth daily., Disp: , Rfl:  silver sulfADIAZINE (SILVADENE) 1 % cream, Use to area three times daily, Disp: 50 g, Rfl: 11 tirzepatide (MOUNJARO) 5 MG/0.5ML Pen, Inject 5 mg into the skin once a week., Disp: 2 mL, Rfl: 3 VITAMIN D PO, Take 1 tablet by  mouth daily., Disp: , Rfl:  tirzepatide (MOUNJARO) 2.5 MG/0.5ML Pen, Inject 2.5 mg into the skin once a week. (Patient not taking: Reported on 10/24/2022), Disp: 2 mL, Rfl: 3  No current facility-administered medications for this visit.    Blood pressure 126/89, pulse 85, height 5\' 8"  (1.727 m), weight 275 lb (124.7 kg), last menstrual period 12/12/2021.  Physical Exam: All incisions look the best they have,  no erythema, cuff is nontender Abdomainal exam Is benign  Diagnostic Tests:   Pathology: benign  Impression + Management plan: (W09.811) Post-operative state: RA TLH BS 09/05/22  (primary encounter diagnosis) Comment: Has had a interesting post op course, allergic reaction to a drug, ?clindamycin, staph of her incisions, really high blood sugars and a late cuff cellulitis?     Medications Prescribed this encounter: No orders of the defined types were placed in this encounter.     Follow up: prn   Lazaro Arms, MD Attending Physician for the [facility] 10/24/2022 11:21 AM

## 2022-10-27 ENCOUNTER — Encounter: Payer: Commercial Managed Care - PPO | Admitting: Obstetrics & Gynecology

## 2022-10-27 ENCOUNTER — Other Ambulatory Visit (HOSPITAL_COMMUNITY): Payer: Self-pay

## 2022-10-29 DIAGNOSIS — L732 Hidradenitis suppurativa: Secondary | ICD-10-CM | POA: Diagnosis not present

## 2022-10-29 DIAGNOSIS — D225 Melanocytic nevi of trunk: Secondary | ICD-10-CM | POA: Diagnosis not present

## 2022-10-29 DIAGNOSIS — D485 Neoplasm of uncertain behavior of skin: Secondary | ICD-10-CM | POA: Diagnosis not present

## 2022-10-29 DIAGNOSIS — Z1283 Encounter for screening for malignant neoplasm of skin: Secondary | ICD-10-CM | POA: Diagnosis not present

## 2022-10-29 DIAGNOSIS — Z79899 Other long term (current) drug therapy: Secondary | ICD-10-CM | POA: Diagnosis not present

## 2022-10-30 ENCOUNTER — Other Ambulatory Visit: Payer: Self-pay

## 2022-11-03 ENCOUNTER — Other Ambulatory Visit (HOSPITAL_BASED_OUTPATIENT_CLINIC_OR_DEPARTMENT_OTHER): Payer: Self-pay

## 2022-11-04 ENCOUNTER — Other Ambulatory Visit (HOSPITAL_COMMUNITY): Payer: Self-pay | Admitting: Family Medicine

## 2022-11-04 DIAGNOSIS — R7401 Elevation of levels of liver transaminase levels: Secondary | ICD-10-CM

## 2022-11-06 ENCOUNTER — Other Ambulatory Visit (HOSPITAL_COMMUNITY): Payer: Self-pay

## 2022-11-09 NOTE — Progress Notes (Unsigned)
GI Office Note    Referring Provider: Benita Stabile, MD Primary Care Physician:  Benita Stabile, MD  Primary Gastroenterologist: Roetta Sessions, MD   Chief Complaint   No chief complaint on file.   History of Present Illness   Cheryl Black is a 37 y.o. female presenting today    [facility] labs ***  RUQ U/S scheduled for later this week.   CT A/P with contrast 01/2021 IMPRESSION: 1. CT findings of gastroenteritis. No bowel obstruction or inflammatory change. 2. Stomach increasingly distended with fluid and food products which could be due to recent ingestion or impaired gastric emptying. 3. Constipation and diverticulosis. 4. She appears to have developed a left-sided hydrosalpinx but there are no inflammatory changes or air-fluid levels associated with this. If there are referable symptoms, ultrasound may be helpful for further study. 5. Fatty liver, with mild hepatosplenomegaly. 6. Remaining findings described above.      Medications   Current Outpatient Medications  Medication Sig Dispense Refill   acetaminophen (TYLENOL) 500 MG tablet Take 1,000 mg by mouth every 6 (six) hours as needed for moderate pain or headache.     albuterol (PROVENTIL) (2.5 MG/3ML) 0.083% nebulizer solution Inhale 3 mLs (2.5 mg total) by nebulization every 2 (two) hours as needed for wheezing or shortness of breath. 90 mL 1   albuterol (VENTOLIN HFA) 108 (90 Base) MCG/ACT inhaler Inhale 2 puffs into the lungs every 4 (four) - 6 (six)  hours as needed for shortness of breath. 18 g 2   allopurinol (ZYLOPRIM) 300 MG tablet Take 1 tablet (300 mg total) by mouth daily. 90 tablet 2   Azelastine-Fluticasone 137-50 MCG/ACT SUSP Place 1 spray into the nose in the morning and at bedtime. 23 g 5   BIOTIN PO Take 1 tablet by mouth daily.     Blood Glucose Monitoring Suppl (FREESTYLE LITE) w/Device KIT Use to test blood sugar 1 kit 0   colchicine 0.6 MG tablet Take 2 tablets by mouth now, then take 1  tablet by mouth 6 hours later as directed. 3 tablet 1   Continuous Glucose Sensor (DEXCOM G7 SENSOR) MISC Use as directed for blood glucose monitoring, replace every 10 days. 3 each 11   dapagliflozin propanediol (FARXIGA) 5 MG TABS tablet Take 1 tablet (5 mg total) by mouth daily. 30 tablet 5   Glucagon (GVOKE HYPOPEN 2-PACK) 1 MG/0.2ML SOAJ use under the skin for hypoglycemia as needed 0.4 mL 1   glucose blood test strip Use 2-3 times daily to test blood sugar 100 each 1   hydrochlorothiazide (HYDRODIURIL) 12.5 MG tablet Take 1 tablet (12.5 mg total) by mouth daily for high blood pressure. 90 tablet 2   insulin lispro (HUMALOG) 100 UNIT/ML injection Inject 0.1 mLs (10 Units total) into the skin 3 (three) times daily before meals. 10-15 10 mL 11   Insulin Pen Needle 32G X 4 MM MISC Use as directed to inject insulin 4 times a day 100 each 3   Lancets (ONETOUCH DELICA PLUS LANCET33G) MISC Use to test blood sugar 2-3 times daily 100 each 3   loratadine (CLARITIN) 10 MG tablet Take 1 tablet (10 mg total) by mouth daily. 90 tablet 3   metFORMIN (GLUCOPHAGE-XR) 500 MG 24 hr tablet Take 2 tablets (1,000 mg total) by mouth daily. 180 tablet 1   metoCLOPramide (REGLAN) 10 MG tablet Take 1 tablet (10 mg total) by mouth every 8 (eight) hours as needed for nausea. 120 tablet  0   pantoprazole (PROTONIX) 40 MG tablet Take 1 tablet (40 mg total) by mouth 2 (two) times daily. 180 tablet 2   potassium chloride (KLOR-CON) 10 MEQ tablet Take 1 tablet (10 mEq total) by mouth daily. 90 tablet 2   pravastatin (PRAVACHOL) 20 MG tablet Take 1 tablet (20 mg total) by mouth Nightly for cholesterol (Patient taking differently: Take 20 mg by mouth See admin instructions. Takes 4 times during the week (sun-wed)) 90 tablet 2   Probiotic, Lactobacillus, CAPS Take 1 capsule by mouth daily.     silver sulfADIAZINE (SILVADENE) 1 % cream Use to area three times daily 50 g 11   tirzepatide (MOUNJARO) 2.5 MG/0.5ML Pen Inject 2.5 mg  into the skin once a week. (Patient not taking: Reported on 10/24/2022) 2 mL 3   tirzepatide (MOUNJARO) 5 MG/0.5ML Pen Inject 5 mg into the skin once a week. 2 mL 3   VITAMIN D PO Take 1 tablet by mouth daily.     No current facility-administered medications for this visit.    Allergies   Allergies as of 11/10/2022 - Review Complete 10/24/2022  Allergen Reaction Noted   Ace inhibitors Anaphylaxis 03/09/2016   Beta adrenergic blockers Other (See Comments) 10/21/2016   Clindamycin/lincomycin Hives 10/23/2022   Augmentin [amoxicillin-pot clavulanate] Other (See Comments) 03/03/2016   Benicar [olmesartan] Swelling 06/08/2012   Doxycycline Other (See Comments) 06/08/2012   Egg-derived products  09/16/2017   Influenza vaccine live Swelling 10/13/2010   Influenza virus vaccine Hives 12/27/2020   Invokana [canagliflozin] Other (See Comments) 03/03/2016   Liraglutide Nausea And Vomiting 12/27/2020     Past Medical History   Past Medical History:  Diagnosis Date   Acid reflux    Asthma    Clostridium difficile infection    in the past   Diabetes mellitus    Fatty liver    Fibrocystic breast disease    Gout    Hidradenitis    History of kidney stones    Hyperlipemia    Obesity    Polycystic ovary syndrome    PONV (postoperative nausea and vomiting)    Respiratory failure (HCC)    "2018 and 2019"   Sleep apnea    Smoker     Past Surgical History   Past Surgical History:  Procedure Laterality Date   CHOLECYSTECTOMY  01/10/2011   Procedure: LAPAROSCOPIC CHOLECYSTECTOMY;  Surgeon: Dalia Heading;  Location: [facility];  Service: General;  Laterality: N/A;   ESOPHAGOGASTRODUODENOSCOPY (EGD) WITH PROPOFOL N/A 09/09/2018   RMR: Mild erosive reflux esophagitis.  Esophagus dilated for history of dysphagia.   HYDRADENITIS EXCISION Right 06/18/2012   Procedure: EXCISION HYDRIADENITIS SUPRATIVA  RIGHT AXILLA;  Surgeon: Marlane Hatcher, MD;  Location: [facility];  Service: General;   Laterality: Right;   LIVER BIOPSY  01/10/2011   Procedure: LIVER BIOPSY;  Surgeon: Dalia Heading;  Location: [facility];  Service: General;;   MALONEY DILATION N/A 09/09/2018   Procedure: Elease Hashimoto DILATION;  Surgeon: Corbin Ade, MD;  Location: [facility];  Service: Endoscopy;  Laterality: N/A;   ROBOTIC ASSISTED TOTAL HYSTERECTOMY WITH BILATERAL SALPINGO OOPHERECTOMY Bilateral 09/05/2022   Procedure: XI ROBOTIC ASSISTED TOTAL HYSTERECTOMY WITH BILATERAL SALPINGO;  Surgeon: Lazaro Arms, MD;  Location: [facility];  Service: Gynecology;  Laterality: Bilateral;   SKIN SPLIT GRAFT Right 06/18/2012   Procedure: SKIN GRAFT SPLIT THICKNESS RIGHT AXILLA(WITH TWO STANDARD SKIN BOARDS 3"x 8" )  DONOR SITE RIGHT AND LEFT THIGHS;  Surgeon: Vilinda Blanks  Malvin Johns, MD;  Location: [facility];  Service: General;  Laterality: Right;    Past Family History   Family History  Problem Relation Age of Onset   Other Mother        pre-cancerous cells; had hyst   Emphysema Mother        smoker   COPD Mother    Diabetes Father    Hypertension Father    Cancer Sister        cervical; had hyst   Gout Brother    Hypertension Maternal Grandmother    Heart attack Maternal Grandfather    Cancer Maternal Grandfather        adenocarcinoma   Emphysema Maternal Grandfather        smoked   Hypertension Paternal Grandmother    Diabetes Paternal Grandmother    Gout Paternal Grandmother    Asthma Maternal Uncle    Stroke Other        paternal great grandfather   Colon cancer Neg Hx    Liver disease Neg Hx    Liver cancer Neg Hx     Past Social History   Social History   Socioeconomic History   Marital status: Married    Spouse name: Fayrene Fearing   Number of children: 0   Years of education: Not on file   Highest education level: Not on file  Occupational History   Occupation: nurse  Tobacco Use   Smoking status: Former    Current packs/day: 0.00    Average packs/day: 1 pack/day for 10.0 years (10.0 ttl pk-yrs)     Types: Cigarettes    Start date: 01/07/2008    Quit date: 01/06/2018    Years since quitting: 4.8    Passive exposure: Current   Smokeless tobacco: Never  Vaping Use   Vaping status: Never Used  Substance and Sexual Activity   Alcohol use: Not Currently    Comment: occas   Drug use: No   Sexual activity: Yes    Birth control/protection: None  Other Topics Concern   Not on file  Social History Narrative   Is a nurse here at [facility]   Social Determinants of Health   Financial Resource Strain: Low Risk  (10/04/2020)   Overall Financial Resource Strain (CARDIA)    Difficulty of Paying Living Expenses: Not hard at all  Food Insecurity: No Food Insecurity (07/09/2022)   Hunger Vital Sign    Worried About Running Out of Food in the Last Year: Never true    Ran Out of Food in the Last Year: Never true  Transportation Needs: No Transportation Needs (07/09/2022)   PRAPARE - Administrator, Civil Service (Medical): No    Lack of Transportation (Non-Medical): No  Physical Activity: Sufficiently Active (10/04/2020)   Exercise Vital Sign    Days of Exercise per Week: 4 days    Minutes of Exercise per Session: 60 min  Stress: No Stress Concern Present (10/04/2020)   [employer] of Occupational Health - Occupational Stress Questionnaire    Feeling of Stress : Only a little  Social Connections: Socially Isolated (10/04/2020)   Social Connection and Isolation Panel [NHANES]    Frequency of Communication with Friends and Family: Once a week    Frequency of Social Gatherings with Friends and Family: Once a week    Attends Religious Services: Never    Database administrator or Organizations: No    Attends Banker Meetings: Never    Marital Status:  Married  Intimate Partner Violence: Not At Risk (07/09/2022)   Humiliation, Afraid, Rape, and Kick questionnaire    Fear of Current or Ex-Partner: No    Emotionally Abused: No    Physically Abused: No    Sexually Abused: No     Review of Systems   General: Negative for anorexia, weight loss, fever, chills, fatigue, weakness. ENT: Negative for hoarseness, difficulty swallowing , nasal congestion. CV: Negative for chest pain, angina, palpitations, dyspnea on exertion, peripheral edema.  Respiratory: Negative for dyspnea at rest, dyspnea on exertion, cough, sputum, wheezing.  GI: See history of present illness. GU:  Negative for dysuria, hematuria, urinary incontinence, urinary frequency, nocturnal urination.  Endo: Negative for unusual weight change.     Physical Exam   LMP 12/12/2021 (Exact Date)    General: Well-nourished, well-developed in no acute distress.  Eyes: No icterus. Mouth: Oropharyngeal mucosa moist and pink , no lesions erythema or exudate. Lungs: Clear to auscultation bilaterally.  Heart: Regular rate and rhythm, no murmurs rubs or gallops.  Abdomen: Bowel sounds are normal, nontender, nondistended, no hepatosplenomegaly or masses,  no abdominal bruits or hernia , no rebound or guarding.  Rectal: ***  Extremities: No lower extremity edema. No clubbing or deformities. Neuro: Alert and oriented x 4   Skin: Warm and dry, no jaundice.   Psych: Alert and cooperative, normal mood and affect.  Labs   Lab Results  Component Value Date   NA 137 09/07/2022   CL 103 09/07/2022   K 3.6 09/07/2022   CO2 25 09/07/2022   BUN 14 09/07/2022   CREATININE 0.83 09/07/2022   GFRNONAA >60 09/07/2022   CALCIUM 9.7 09/07/2022   ALBUMIN 3.3 (L) 09/07/2022   GLUCOSE 236 (H) 09/07/2022   Lab Results  Component Value Date   ALT 37 09/07/2022   AST 20 09/07/2022   ALKPHOS 61 09/07/2022   BILITOT 0.6 09/07/2022   Lab Results  Component Value Date   WBC 11.2 (H) 09/07/2022   HGB 14.9 09/07/2022   HCT 44.5 09/07/2022   MCV 89.7 09/07/2022   PLT 289 09/07/2022   Lab Results  Component Value Date   HGBA1C 7.6 (H) 09/03/2022    Imaging Studies   No results found.  Assessment        PLAN   ***   Leanna Battles. Melvyn Neth, MHS, PA-C [facility]

## 2022-11-10 ENCOUNTER — Encounter: Payer: Self-pay | Admitting: Gastroenterology

## 2022-11-10 ENCOUNTER — Ambulatory Visit (INDEPENDENT_AMBULATORY_CARE_PROVIDER_SITE_OTHER): Payer: Commercial Managed Care - PPO | Admitting: Gastroenterology

## 2022-11-10 ENCOUNTER — Other Ambulatory Visit (HOSPITAL_COMMUNITY): Payer: Self-pay

## 2022-11-10 VITALS — BP 113/78 | HR 83 | Temp 97.8°F | Ht 69.0 in | Wt 277.0 lb

## 2022-11-10 DIAGNOSIS — R7402 Elevation of levels of lactic acid dehydrogenase (LDH): Secondary | ICD-10-CM | POA: Diagnosis not present

## 2022-11-10 DIAGNOSIS — R7401 Elevation of levels of liver transaminase levels: Secondary | ICD-10-CM

## 2022-11-10 DIAGNOSIS — R1011 Right upper quadrant pain: Secondary | ICD-10-CM

## 2022-11-10 DIAGNOSIS — K7581 Nonalcoholic steatohepatitis (NASH): Secondary | ICD-10-CM | POA: Diagnosis not present

## 2022-11-10 NOTE — Patient Instructions (Signed)
Complete labs at your convenience. We will be in touch with results as available. We will follow up on your upcoming ultrasound results.

## 2022-11-11 ENCOUNTER — Other Ambulatory Visit (HOSPITAL_COMMUNITY): Payer: Self-pay

## 2022-11-11 DIAGNOSIS — R7402 Elevation of levels of lactic acid dehydrogenase (LDH): Secondary | ICD-10-CM | POA: Diagnosis not present

## 2022-11-11 DIAGNOSIS — K7581 Nonalcoholic steatohepatitis (NASH): Secondary | ICD-10-CM | POA: Diagnosis not present

## 2022-11-11 DIAGNOSIS — R7401 Elevation of levels of liver transaminase levels: Secondary | ICD-10-CM | POA: Diagnosis not present

## 2022-11-11 DIAGNOSIS — R1011 Right upper quadrant pain: Secondary | ICD-10-CM | POA: Diagnosis not present

## 2022-11-11 MED ORDER — MOUNJARO 5 MG/0.5ML ~~LOC~~ SOAJ
5.0000 mg | SUBCUTANEOUS | 3 refills | Status: DC
  Filled 2022-11-11: qty 2, 28d supply, fill #0
  Filled 2022-12-04: qty 2, 28d supply, fill #1
  Filled 2023-01-02: qty 2, 28d supply, fill #2

## 2022-11-13 LAB — HEPATIC FUNCTION PANEL
ALT: 117 IU/L — ABNORMAL HIGH (ref 0–32)
AST: 102 [IU]/L — ABNORMAL HIGH (ref 0–40)
Albumin: 4.1 g/dL (ref 3.9–4.9)
Alkaline Phosphatase: 96 [IU]/L (ref 44–121)
Bilirubin Total: 0.7 mg/dL (ref 0.0–1.2)
Bilirubin, Direct: 0.24 mg/dL (ref 0.00–0.40)
Total Protein: 6.6 g/dL (ref 6.0–8.5)

## 2022-11-13 LAB — IGG, IGA, IGM
IgA/Immunoglobulin A, Serum: 279 mg/dL (ref 87–352)
IgG (Immunoglobin G), Serum: 885 mg/dL (ref 586–1602)
IgM (Immunoglobulin M), Srm: 65 mg/dL (ref 26–217)

## 2022-11-13 LAB — IRON,TIBC AND FERRITIN PANEL
Ferritin: 150 ng/mL (ref 15–150)
Iron Saturation: 28 % (ref 15–55)
Iron: 89 ug/dL (ref 27–159)
Total Iron Binding Capacity: 317 ug/dL (ref 250–450)
UIBC: 228 ug/dL (ref 131–425)

## 2022-11-13 LAB — HEPATITIS C ANTIBODY: Hep C Virus Ab: NONREACTIVE

## 2022-11-13 LAB — TISSUE TRANSGLUTAMINASE, IGA: Transglutaminase IgA: <2 U/mL (ref 0–3)

## 2022-11-13 LAB — MITOCHONDRIAL/SMOOTH MUSCLE AB PNL
Mitochondrial Ab: <20 U (ref 0.0–20.0)
Smooth Muscle Ab: 4 U (ref 0–19)

## 2022-11-13 LAB — ANA: Anti Nuclear Antibody (ANA): NEGATIVE

## 2022-11-13 LAB — HEPATITIS B SURFACE ANTIGEN: Hepatitis B Surface Ag: NEGATIVE

## 2022-11-14 ENCOUNTER — Ambulatory Visit (HOSPITAL_COMMUNITY)
Admission: RE | Admit: 2022-11-14 | Discharge: 2022-11-14 | Disposition: A | Discharge status: Home / Self Care | Payer: Commercial Managed Care - PPO | Source: Ambulatory Visit | Attending: Family Medicine | Admitting: Family Medicine

## 2022-11-14 DIAGNOSIS — R7401 Elevation of levels of liver transaminase levels: Secondary | ICD-10-CM | POA: Insufficient documentation

## 2022-11-14 DIAGNOSIS — R748 Abnormal levels of other serum enzymes: Secondary | ICD-10-CM | POA: Diagnosis not present

## 2022-11-14 DIAGNOSIS — R7989 Other specified abnormal findings of blood chemistry: Secondary | ICD-10-CM

## 2022-11-25 ENCOUNTER — Other Ambulatory Visit (HOSPITAL_COMMUNITY): Payer: Self-pay

## 2022-11-26 ENCOUNTER — Other Ambulatory Visit (HOSPITAL_COMMUNITY): Payer: Self-pay

## 2022-11-27 ENCOUNTER — Other Ambulatory Visit (HOSPITAL_COMMUNITY): Payer: Self-pay

## 2022-11-27 MED ORDER — COLCHICINE 0.6 MG PO TABS
ORAL_TABLET | ORAL | 1 refills | Status: DC
  Filled 2022-11-27: qty 3, 1d supply, fill #0
  Filled 2022-11-28: qty 3, 1d supply, fill #1

## 2022-12-05 ENCOUNTER — Other Ambulatory Visit (HOSPITAL_COMMUNITY): Payer: Self-pay

## 2022-12-09 ENCOUNTER — Other Ambulatory Visit (HOSPITAL_COMMUNITY): Payer: Self-pay

## 2022-12-10 ENCOUNTER — Encounter: Payer: Self-pay | Admitting: Internal Medicine

## 2022-12-10 ENCOUNTER — Other Ambulatory Visit: Payer: Self-pay

## 2022-12-10 MED ORDER — FLUTICASONE-SALMETEROL 115-21 MCG/ACT IN AERO
2.0000 | INHALATION_SPRAY | Freq: Two times a day (BID) | RESPIRATORY_TRACT | 5 refills | Status: AC
  Filled 2022-12-10: qty 12, 30d supply, fill #0
  Filled 2023-01-02 – 2023-01-05 (×2): qty 12, 30d supply, fill #1
  Filled 2023-03-11 – 2023-03-26 (×2): qty 12, 30d supply, fill #2
  Filled 2023-05-25: qty 12, 30d supply, fill #3
  Filled 2023-09-14: qty 12, 30d supply, fill #4
  Filled 2023-10-10: qty 12, 30d supply, fill #5

## 2022-12-11 DIAGNOSIS — E1165 Type 2 diabetes mellitus with hyperglycemia: Secondary | ICD-10-CM | POA: Diagnosis not present

## 2022-12-15 NOTE — Telephone Encounter (Signed)
Please arrange for labs including ceruloplasmin, GGT, LFTs, TSH, free T4.  Dx: elevated LFTs  Orders place and needs to be done sometime in the next month. I have sent patient a mychart message about this.

## 2022-12-16 NOTE — Telephone Encounter (Signed)
noted 

## 2022-12-18 DIAGNOSIS — E1165 Type 2 diabetes mellitus with hyperglycemia: Secondary | ICD-10-CM | POA: Diagnosis not present

## 2022-12-18 DIAGNOSIS — I10 Essential (primary) hypertension: Secondary | ICD-10-CM | POA: Diagnosis not present

## 2022-12-18 DIAGNOSIS — E785 Hyperlipidemia, unspecified: Secondary | ICD-10-CM | POA: Diagnosis not present

## 2022-12-19 ENCOUNTER — Other Ambulatory Visit (HOSPITAL_COMMUNITY): Payer: Self-pay

## 2022-12-19 DIAGNOSIS — R7989 Other specified abnormal findings of blood chemistry: Secondary | ICD-10-CM | POA: Diagnosis not present

## 2022-12-20 ENCOUNTER — Other Ambulatory Visit: Payer: Self-pay

## 2022-12-20 LAB — HEPATIC FUNCTION PANEL
ALT: 98 [IU]/L — ABNORMAL HIGH (ref 0–32)
AST: 68 [IU]/L — ABNORMAL HIGH (ref 0–40)
Albumin: 4.3 g/dL (ref 3.9–4.9)
Alkaline Phosphatase: 85 [IU]/L (ref 44–121)
Bilirubin Total: 0.6 mg/dL (ref 0.0–1.2)
Bilirubin, Direct: 0.21 mg/dL (ref 0.00–0.40)
Total Protein: 6.6 g/dL (ref 6.0–8.5)

## 2022-12-20 LAB — GAMMA GT: GGT: 44 [IU]/L (ref 0–60)

## 2022-12-20 LAB — TSH+FREE T4
Free T4: 1.31 ng/dL (ref 0.82–1.77)
TSH: 1.97 u[IU]/mL (ref 0.450–4.500)

## 2022-12-20 LAB — CERULOPLASMIN: Ceruloplasmin: 26 mg/dL (ref 19.0–39.0)

## 2022-12-24 ENCOUNTER — Other Ambulatory Visit: Payer: Self-pay

## 2022-12-24 ENCOUNTER — Other Ambulatory Visit (HOSPITAL_COMMUNITY): Payer: Self-pay

## 2022-12-24 DIAGNOSIS — R7402 Elevation of levels of lactic acid dehydrogenase (LDH): Secondary | ICD-10-CM

## 2022-12-24 DIAGNOSIS — R7989 Other specified abnormal findings of blood chemistry: Secondary | ICD-10-CM

## 2022-12-24 DIAGNOSIS — K7581 Nonalcoholic steatohepatitis (NASH): Secondary | ICD-10-CM

## 2022-12-25 ENCOUNTER — Other Ambulatory Visit (HOSPITAL_COMMUNITY): Payer: Self-pay

## 2022-12-30 ENCOUNTER — Other Ambulatory Visit (HOSPITAL_COMMUNITY): Payer: Self-pay

## 2022-12-31 ENCOUNTER — Other Ambulatory Visit (HOSPITAL_COMMUNITY): Payer: Self-pay

## 2023-01-01 ENCOUNTER — Other Ambulatory Visit (HOSPITAL_COMMUNITY): Payer: Self-pay

## 2023-01-01 ENCOUNTER — Other Ambulatory Visit: Payer: Self-pay

## 2023-01-01 MED ORDER — DAPAGLIFLOZIN PROPANEDIOL 5 MG PO TABS
5.0000 mg | ORAL_TABLET | Freq: Every day | ORAL | 5 refills | Status: DC
  Filled 2023-01-01: qty 30, 30d supply, fill #0
  Filled 2023-01-02 – 2023-02-19 (×4): qty 30, 30d supply, fill #1

## 2023-01-01 MED ORDER — DEXCOM G7 SENSOR MISC
11 refills | Status: DC
  Filled 2023-01-01: qty 3, 30d supply, fill #0
  Filled 2023-01-02 – 2023-02-03 (×3): qty 3, 30d supply, fill #1
  Filled 2023-02-26 – 2023-03-02 (×3): qty 3, 30d supply, fill #2
  Filled 2023-03-28: qty 3, 30d supply, fill #3
  Filled 2023-04-27: qty 3, 30d supply, fill #4
  Filled 2023-05-25 (×2): qty 3, 30d supply, fill #5
  Filled 2023-06-22 – 2023-06-24 (×2): qty 3, 30d supply, fill #6
  Filled 2023-07-20: qty 3, 30d supply, fill #7
  Filled 2023-08-17 – 2023-08-24 (×2): qty 3, 30d supply, fill #8
  Filled 2023-09-17: qty 3, 30d supply, fill #9
  Filled 2023-10-17 – 2023-10-20 (×3): qty 3, 30d supply, fill #10
  Filled 2023-11-16 – 2023-11-23 (×2): qty 3, 30d supply, fill #11

## 2023-01-02 ENCOUNTER — Other Ambulatory Visit (HOSPITAL_COMMUNITY): Payer: Self-pay

## 2023-01-05 ENCOUNTER — Other Ambulatory Visit: Payer: Self-pay

## 2023-01-05 DIAGNOSIS — K7581 Nonalcoholic steatohepatitis (NASH): Secondary | ICD-10-CM

## 2023-01-05 DIAGNOSIS — R7989 Other specified abnormal findings of blood chemistry: Secondary | ICD-10-CM

## 2023-01-05 DIAGNOSIS — R7401 Elevation of levels of liver transaminase levels: Secondary | ICD-10-CM

## 2023-01-06 ENCOUNTER — Inpatient Hospital Stay (HOSPITAL_COMMUNITY)
Admission: EM | Admit: 2023-01-06 | Discharge: 2023-01-08 | DRG: 391 | Disposition: A | Discharge status: Home / Self Care | Payer: Commercial Managed Care - PPO | Attending: Internal Medicine | Admitting: Internal Medicine

## 2023-01-06 ENCOUNTER — Other Ambulatory Visit: Payer: Self-pay

## 2023-01-06 ENCOUNTER — Emergency Department (HOSPITAL_COMMUNITY): Payer: Commercial Managed Care - PPO

## 2023-01-06 ENCOUNTER — Encounter (HOSPITAL_COMMUNITY): Payer: Self-pay

## 2023-01-06 DIAGNOSIS — I10 Essential (primary) hypertension: Secondary | ICD-10-CM | POA: Diagnosis not present

## 2023-01-06 DIAGNOSIS — Z7984 Long term (current) use of oral hypoglycemic drugs: Secondary | ICD-10-CM

## 2023-01-06 DIAGNOSIS — Z91012 Allergy to eggs: Secondary | ICD-10-CM | POA: Diagnosis not present

## 2023-01-06 DIAGNOSIS — N390 Urinary tract infection, site not specified: Secondary | ICD-10-CM | POA: Diagnosis not present

## 2023-01-06 DIAGNOSIS — Z889 Allergy status to unspecified drugs, medicaments and biological substances status: Secondary | ICD-10-CM

## 2023-01-06 DIAGNOSIS — K219 Gastro-esophageal reflux disease without esophagitis: Secondary | ICD-10-CM | POA: Diagnosis present

## 2023-01-06 DIAGNOSIS — Z823 Family history of stroke: Secondary | ICD-10-CM

## 2023-01-06 DIAGNOSIS — R6510 Systemic inflammatory response syndrome (SIRS) of non-infectious origin without acute organ dysfunction: Secondary | ICD-10-CM | POA: Diagnosis not present

## 2023-01-06 DIAGNOSIS — Z79899 Other long term (current) drug therapy: Secondary | ICD-10-CM

## 2023-01-06 DIAGNOSIS — K529 Noninfective gastroenteritis and colitis, unspecified: Secondary | ICD-10-CM | POA: Diagnosis not present

## 2023-01-06 DIAGNOSIS — Z825 Family history of asthma and other chronic lower respiratory diseases: Secondary | ICD-10-CM

## 2023-01-06 DIAGNOSIS — Z887 Allergy status to serum and vaccine status: Secondary | ICD-10-CM

## 2023-01-06 DIAGNOSIS — Z6841 Body Mass Index (BMI) 40.0 and over, adult: Secondary | ICD-10-CM

## 2023-01-06 DIAGNOSIS — Z8249 Family history of ischemic heart disease and other diseases of the circulatory system: Secondary | ICD-10-CM | POA: Diagnosis not present

## 2023-01-06 DIAGNOSIS — I959 Hypotension, unspecified: Secondary | ICD-10-CM | POA: Diagnosis present

## 2023-01-06 DIAGNOSIS — Z888 Allergy status to other drugs, medicaments and biological substances status: Secondary | ICD-10-CM | POA: Diagnosis not present

## 2023-01-06 DIAGNOSIS — Z9049 Acquired absence of other specified parts of digestive tract: Secondary | ICD-10-CM | POA: Diagnosis not present

## 2023-01-06 DIAGNOSIS — J45909 Unspecified asthma, uncomplicated: Secondary | ICD-10-CM | POA: Diagnosis present

## 2023-01-06 DIAGNOSIS — Z880 Allergy status to penicillin: Secondary | ICD-10-CM | POA: Diagnosis not present

## 2023-01-06 DIAGNOSIS — R197 Diarrhea, unspecified: Secondary | ICD-10-CM | POA: Diagnosis not present

## 2023-01-06 DIAGNOSIS — E876 Hypokalemia: Secondary | ICD-10-CM | POA: Diagnosis present

## 2023-01-06 DIAGNOSIS — Z881 Allergy status to other antibiotic agents status: Secondary | ICD-10-CM | POA: Diagnosis not present

## 2023-01-06 DIAGNOSIS — Z90710 Acquired absence of both cervix and uterus: Secondary | ICD-10-CM | POA: Diagnosis not present

## 2023-01-06 DIAGNOSIS — R509 Fever, unspecified: Secondary | ICD-10-CM | POA: Diagnosis present

## 2023-01-06 DIAGNOSIS — E782 Mixed hyperlipidemia: Secondary | ICD-10-CM | POA: Diagnosis not present

## 2023-01-06 DIAGNOSIS — R81 Glycosuria: Secondary | ICD-10-CM | POA: Diagnosis present

## 2023-01-06 DIAGNOSIS — R109 Unspecified abdominal pain: Secondary | ICD-10-CM | POA: Diagnosis not present

## 2023-01-06 DIAGNOSIS — Z808 Family history of malignant neoplasm of other organs or systems: Secondary | ICD-10-CM

## 2023-01-06 DIAGNOSIS — Z87891 Personal history of nicotine dependence: Secondary | ICD-10-CM | POA: Diagnosis not present

## 2023-01-06 DIAGNOSIS — R000 Tachycardia, unspecified: Secondary | ICD-10-CM | POA: Diagnosis not present

## 2023-01-06 DIAGNOSIS — E66813 Obesity, class 3: Secondary | ICD-10-CM | POA: Diagnosis not present

## 2023-01-06 DIAGNOSIS — Z794 Long term (current) use of insulin: Secondary | ICD-10-CM | POA: Diagnosis not present

## 2023-01-06 DIAGNOSIS — R0902 Hypoxemia: Secondary | ICD-10-CM | POA: Diagnosis present

## 2023-01-06 DIAGNOSIS — R112 Nausea with vomiting, unspecified: Secondary | ICD-10-CM | POA: Diagnosis not present

## 2023-01-06 DIAGNOSIS — E1165 Type 2 diabetes mellitus with hyperglycemia: Secondary | ICD-10-CM | POA: Insufficient documentation

## 2023-01-06 DIAGNOSIS — Z7951 Long term (current) use of inhaled steroids: Secondary | ICD-10-CM

## 2023-01-06 DIAGNOSIS — Z833 Family history of diabetes mellitus: Secondary | ICD-10-CM

## 2023-01-06 DIAGNOSIS — Z87442 Personal history of urinary calculi: Secondary | ICD-10-CM

## 2023-01-06 DIAGNOSIS — Z8049 Family history of malignant neoplasm of other genital organs: Secondary | ICD-10-CM

## 2023-01-06 DIAGNOSIS — A419 Sepsis, unspecified organism: Secondary | ICD-10-CM | POA: Diagnosis not present

## 2023-01-06 DIAGNOSIS — Z7985 Long-term (current) use of injectable non-insulin antidiabetic drugs: Secondary | ICD-10-CM

## 2023-01-06 DIAGNOSIS — R7401 Elevation of levels of liver transaminase levels: Secondary | ICD-10-CM | POA: Diagnosis present

## 2023-01-06 DIAGNOSIS — E282 Polycystic ovarian syndrome: Secondary | ICD-10-CM | POA: Diagnosis present

## 2023-01-06 LAB — CBC WITH DIFFERENTIAL/PLATELET
Abs Immature Granulocytes: 0.03 10*3/uL (ref 0.00–0.07)
Basophils Absolute: 0 10*3/uL (ref 0.0–0.1)
Basophils Relative: 0 %
Eosinophils Absolute: 0 10*3/uL (ref 0.0–0.5)
Eosinophils Relative: 0 %
HCT: 56.4 % — ABNORMAL HIGH (ref 36.0–46.0)
Hemoglobin: 19 g/dL — ABNORMAL HIGH (ref 12.0–15.0)
Immature Granulocytes: 0 %
Lymphocytes Relative: 4 %
Lymphs Abs: 0.4 10*3/uL — ABNORMAL LOW (ref 0.7–4.0)
MCH: 29.9 pg (ref 26.0–34.0)
MCHC: 33.7 g/dL (ref 30.0–36.0)
MCV: 88.8 fL (ref 80.0–100.0)
Monocytes Absolute: 0.3 10*3/uL (ref 0.1–1.0)
Monocytes Relative: 3 %
Neutro Abs: 10.1 10*3/uL — ABNORMAL HIGH (ref 1.7–7.7)
Neutrophils Relative %: 93 %
Platelets: 247 10*3/uL (ref 150–400)
RBC: 6.35 MIL/uL — ABNORMAL HIGH (ref 3.87–5.11)
RDW: 13.2 % (ref 11.5–15.5)
WBC: 10.8 10*3/uL — ABNORMAL HIGH (ref 4.0–10.5)
nRBC: 0 % (ref 0.0–0.2)

## 2023-01-06 LAB — COMPREHENSIVE METABOLIC PANEL
ALT: 78 U/L — ABNORMAL HIGH (ref 0–44)
AST: 53 U/L — ABNORMAL HIGH (ref 15–41)
Albumin: 4 g/dL (ref 3.5–5.0)
Alkaline Phosphatase: 59 U/L (ref 38–126)
Anion gap: 9 (ref 5–15)
BUN: 15 mg/dL (ref 6–20)
CO2: 20 mmol/L — ABNORMAL LOW (ref 22–32)
Calcium: 8.9 mg/dL (ref 8.9–10.3)
Chloride: 105 mmol/L (ref 98–111)
Creatinine, Ser: 0.79 mg/dL (ref 0.44–1.00)
GFR, Estimated: >60 mL/min (ref >60)
Glucose, Bld: 183 mg/dL — ABNORMAL HIGH (ref 70–99)
Potassium: 3.7 mmol/L (ref 3.5–5.1)
Sodium: 134 mmol/L — ABNORMAL LOW (ref 135–145)
Total Bilirubin: 1.5 mg/dL — ABNORMAL HIGH (ref 0.0–1.2)
Total Protein: 7.7 g/dL (ref 6.5–8.1)

## 2023-01-06 LAB — RESP PANEL BY RT-PCR (RSV, FLU A&B, COVID)  RVPGX2
Influenza A by PCR: NEGATIVE
Influenza B by PCR: NEGATIVE
Resp Syncytial Virus by PCR: NEGATIVE
SARS Coronavirus 2 by RT PCR: NEGATIVE

## 2023-01-06 LAB — URINALYSIS, ROUTINE W REFLEX MICROSCOPIC
Bilirubin Urine: NEGATIVE
Glucose, UA: >=500 mg/dL — AB
Ketones, ur: NEGATIVE mg/dL
Nitrite: NEGATIVE
Protein, ur: 30 mg/dL — AB
Specific Gravity, Urine: 1.02 (ref 1.005–1.030)
WBC, UA: >50 WBC/hpf (ref 0–5)
pH: 5 (ref 5.0–8.0)

## 2023-01-06 LAB — LIPASE, BLOOD: Lipase: 26 U/L (ref 11–51)

## 2023-01-06 MED ORDER — IOHEXOL 300 MG/ML  SOLN
100.0000 mL | Freq: Once | INTRAMUSCULAR | Status: AC | PRN
  Administered 2023-01-06: 100 mL via INTRAVENOUS

## 2023-01-06 MED ORDER — ONDANSETRON HCL 4 MG/2ML IJ SOLN
4.0000 mg | Freq: Once | INTRAMUSCULAR | Status: AC
  Administered 2023-01-06: 4 mg via INTRAVENOUS
  Filled 2023-01-06: qty 2

## 2023-01-06 MED ORDER — SODIUM CHLORIDE 0.9 % IV SOLN
12.5000 mg | Freq: Four times a day (QID) | INTRAVENOUS | Status: AC | PRN
Start: 2023-01-06 — End: 2023-01-06
  Administered 2023-01-06: 12.5 mg via INTRAVENOUS
  Filled 2023-01-06: qty 0.5

## 2023-01-06 MED ORDER — LACTATED RINGERS IV BOLUS
1000.0000 mL | Freq: Once | INTRAVENOUS | Status: AC
  Administered 2023-01-06: 1000 mL via INTRAVENOUS

## 2023-01-06 MED ORDER — KETOROLAC TROMETHAMINE 15 MG/ML IJ SOLN
15.0000 mg | Freq: Once | INTRAMUSCULAR | Status: AC
  Administered 2023-01-06: 15 mg via INTRAVENOUS
  Filled 2023-01-06: qty 1

## 2023-01-06 MED ORDER — SODIUM CHLORIDE 0.9 % IV BOLUS
1000.0000 mL | Freq: Once | INTRAVENOUS | Status: AC
  Administered 2023-01-06: 1000 mL via INTRAVENOUS

## 2023-01-06 MED ORDER — METOCLOPRAMIDE HCL 5 MG/ML IJ SOLN
10.0000 mg | Freq: Once | INTRAMUSCULAR | Status: AC
  Administered 2023-01-06: 10 mg via INTRAVENOUS
  Filled 2023-01-06: qty 2

## 2023-01-06 MED ORDER — DIPHENHYDRAMINE HCL 50 MG/ML IJ SOLN
25.0000 mg | Freq: Once | INTRAMUSCULAR | Status: AC
  Administered 2023-01-06: 25 mg via INTRAVENOUS
  Filled 2023-01-06: qty 1

## 2023-01-06 MED ORDER — SODIUM CHLORIDE 0.9 % IV SOLN
1.0000 g | Freq: Once | INTRAVENOUS | Status: AC
  Administered 2023-01-06: 1 g via INTRAVENOUS
  Filled 2023-01-06: qty 10

## 2023-01-06 MED ORDER — FAMOTIDINE IN NACL 20-0.9 MG/50ML-% IV SOLN
20.0000 mg | Freq: Once | INTRAVENOUS | Status: AC
  Administered 2023-01-06: 20 mg via INTRAVENOUS
  Filled 2023-01-06: qty 50

## 2023-01-06 NOTE — ED Provider Notes (Addendum)
 [place] EMERGENCY DEPARTMENT AT [facility] Provider Note   CSN: [id number] Arrival date & time: 01/06/23  1705     History  Chief Complaint  Patient presents with   Emesis    Cheryl Black is a 37 y.o. female.  He has PMH of elevated LFTs, PCOS, history of a hysterectomy in 2023.  Presents the ER for nausea vomiting and diarrhea.  This started this morning and has been persistent all day.  Denies hematemesis or hematochezia.  Denies any sick contacts at home but patient is actually a nurse in the ER.  No known bad food exposure.  She tried Zofran  and Reglan  at home without relief.  Did not note a fever at home but noted to have a temperature of 100.6 in the ED at triage.   Emesis      Home Medications Prior to Admission medications   Medication Sig Start Date End Date Taking? Authorizing Provider  acetaminophen  (TYLENOL ) 500 MG tablet Take 1,000 mg by mouth every 6 (six) hours as needed for moderate pain or headache.    [provider]  albuterol  (PROVENTIL ) (2.5 MG/3ML) 0.083% nebulizer solution Inhale 3 mLs (2.5 mg total) by nebulization every 2 (two) hours as needed for wheezing or shortness of breath. 03/27/22   Juvenal Harlene PENNER, DO  albuterol  (VENTOLIN  HFA) 108 (90 Base) MCG/ACT inhaler Inhale 2 puffs into the lungs every 4 (four) - 6 (six)  hours as needed for shortness of breath. 01/10/22     allopurinol  (ZYLOPRIM ) 300 MG tablet Take 1 tablet (300 mg total) by mouth daily. 06/30/22     Azelastine -Fluticasone  137-50 MCG/ACT SUSP Place 1 spray into the nose in the morning and at bedtime. 04/15/22   Desai, Nikita S, MD  BIOTIN PO Take 1 tablet by mouth daily.    [provider]  Blood Glucose Monitoring Suppl (FREESTYLE LITE) w/Device KIT Use to test blood sugar 01/10/22     colchicine  0.6 MG tablet Take 2 tablets by mouth now, then take 1 tablet by mouth 6 hours later as directed. 11/27/22     Continuous Glucose Sensor (DEXCOM G7 SENSOR) MISC Change  every 10 days as directed 12/29/22     dapagliflozin  propanediol (FARXIGA ) 5 MG TABS tablet Take 1 tablet (5 mg total) by mouth daily. 12/29/22     fluticasone -salmeterol (ADVAIR  HFA) 115-21 MCG/ACT inhaler Inhale 2 puffs into the lungs 2 (two) times daily. 12/10/22   Meade Verdon RAMAN, MD  Glucagon  (GVOKE HYPOPEN  2-PACK) 1 MG/0.2ML SOAJ use under the skin for hypoglycemia as needed 01/31/22     glucose blood test strip Use 2-3 times daily to test blood sugar 01/10/22     hydrochlorothiazide  (HYDRODIURIL ) 12.5 MG tablet Take 1 tablet (12.5 mg total) by mouth daily for high blood pressure. 07/04/22     Insulin  Pen Needle 32G X 4 MM MISC Use as directed to inject insulin  4 times a day 01/10/22     Lancets (ONETOUCH DELICA PLUS LANCET33G) MISC Use to test blood sugar 2-3 times daily 01/10/22     loratadine  (CLARITIN ) 10 MG tablet Take 1 tablet (10 mg total) by mouth daily. 04/15/22   Desai, Nikita S, MD  metFORMIN  (GLUCOPHAGE -XR) 500 MG 24 hr tablet Take 2 tablets (1,000 mg total) by mouth daily. 07/28/22     metoCLOPramide  (REGLAN ) 10 MG tablet Take 1 tablet (10 mg total) by mouth every 8 (eight) hours as needed for nausea. 07/11/22   Arrien, Elidia Sieving, MD  pantoprazole  (PROTONIX ) 40 MG tablet Take 1 tablet (40 mg total) by mouth 2 (two) times daily. 07/04/22     potassium chloride  (KLOR-CON ) 10 MEQ tablet Take 1 tablet (10 mEq total) by mouth daily. 07/04/22     pravastatin  (PRAVACHOL ) 20 MG tablet Take 1 tablet (20 mg total) by mouth Nightly for cholesterol Patient taking differently: Take 20 mg by mouth See admin instructions. Takes 4 times during the week (sun-wed) 07/04/22     Probiotic, Lactobacillus, CAPS Take 1 capsule by mouth daily. 11/28/19   [provider]  tirzepatide  (MOUNJARO ) 2.5 MG/0.5ML Pen Inject 2.5 mg into the skin once a week. Patient not taking: Reported on 10/24/2022 05/06/22     tirzepatide  (MOUNJARO ) 5 MG/0.5ML Pen Inject 5 mg into the skin once a week. 11/11/22     VITAMIN D  PO  Take 1 tablet by mouth daily.    [provider]      Allergies    Ace inhibitors, Beta adrenergic blockers, Clindamycin /lincomycin, Augmentin  [amoxicillin -pot clavulanate], Benicar [olmesartan], Doxycycline, Egg-derived products, Influenza vaccine live, Influenza virus vaccine, Invokana [canagliflozin], and Liraglutide    Review of Systems   Review of Systems  Gastrointestinal:  Positive for vomiting.    Physical Exam Updated Vital Signs BP 139/85 (BP Location: Right Arm)   Pulse (!) 127   Temp (!) 100.4 F (38 C) (Oral)   Resp 20   Ht 5' 8 (1.727 m)   Wt 122.5 kg   LMP 12/12/2021 (Exact Date)   SpO2 98%   BMI 41.05 kg/m  Physical Exam Vitals and nursing note reviewed.  Constitutional:      General: She is not in acute distress.    Appearance: She is well-developed.  HENT:     Head: Normocephalic and atraumatic.     Mouth/Throat:     Mouth: Mucous membranes are moist.  Eyes:     Extraocular Movements: Extraocular movements intact.     Conjunctiva/sclera: Conjunctivae normal.  Cardiovascular:     Rate and Rhythm: Normal rate and regular rhythm.     Heart sounds: No murmur heard. Pulmonary:     Effort: Pulmonary effort is normal. No respiratory distress.     Breath sounds: Normal breath sounds.  Abdominal:     Palpations: Abdomen is soft.     Tenderness: There is no abdominal tenderness.  Musculoskeletal:        General: No swelling.     Cervical back: Neck supple.  Skin:    General: Skin is warm and dry.     Capillary Refill: Capillary refill takes less than 2 seconds.  Neurological:     General: No focal deficit present.     Mental Status: She is alert and oriented to person, place, and time.  Psychiatric:        Mood and Affect: Mood normal.     ED Results / Procedures / Treatments   Labs (all labs ordered are listed, but only abnormal results are displayed) Labs Reviewed  CBC WITH DIFFERENTIAL/PLATELET - Abnormal; Notable for the following  components:      Result Value   WBC 10.8 (*)    RBC 6.35 (*)    Hemoglobin 19.0 (*)    HCT 56.4 (*)    Neutro Abs 10.1 (*)    Lymphs Abs 0.4 (*)    All other components within normal limits  COMPREHENSIVE METABOLIC PANEL - Abnormal; Notable for the following components:   Sodium 134 (*)    CO2 20 (*)  Glucose, Bld 183 (*)    AST 53 (*)    ALT 78 (*)    Total Bilirubin 1.5 (*)    All other components within normal limits  URINALYSIS, ROUTINE W REFLEX MICROSCOPIC - Abnormal; Notable for the following components:   Color, Urine AMBER (*)    APPearance CLOUDY (*)    Glucose, UA >=500 (*)    Hgb urine dipstick SMALL (*)    Protein, ur 30 (*)    Leukocytes,Ua MODERATE (*)    Bacteria, UA FEW (*)    All other components within normal limits  RESP PANEL BY RT-PCR (RSV, FLU A&B, COVID)  [id number]  URINE CULTURE  LIPASE, BLOOD    EKG EKG Interpretation Date/Time:  Tuesday January 06 2023 18:53:39 EST Ventricular Rate:  127 PR Interval:  142 QRS Duration:  70 QT Interval:  300 QTC Calculation: 436 R Axis:   97  Text Interpretation: Sinus tachycardia Anterior infarct, old Nonspecific T abnormalities, lateral leads Confirmed by Ula Barter [phone]) on 01/06/2023 7:22:39 PM  Radiology No results found.  Procedures Procedures    Medications Ordered in ED Medications  lactated ringers  bolus 1,000 mL (0 mLs Intravenous Stopped 01/06/23 1914)  promethazine  (PHENERGAN ) 12.5 mg in sodium chloride  0.9 % 50 mL IVPB (0 mg Intravenous Stopped 01/06/23 1945)  lactated ringers  bolus 1,000 mL (0 mLs Intravenous Stopped 01/06/23 2028)  ondansetron  (ZOFRAN ) injection 4 mg (4 mg Intravenous Given 01/06/23 1911)  famotidine  (PEPCID ) IVPB 20 mg premix (0 mg Intravenous Stopped 01/06/23 2120)  cefTRIAXone  (ROCEPHIN ) 1 g in sodium chloride  0.9 % 100 mL IVPB (0 g Intravenous Stopped 01/06/23 2050)  sodium chloride  0.9 % bolus 1,000 mL (0 mLs Intravenous Stopped 01/06/23 2210)  ketorolac   (TORADOL ) 15 MG/ML injection 15 mg (15 mg Intravenous Given 01/06/23 2126)  metoCLOPramide  (REGLAN ) injection 10 mg (10 mg Intravenous Given 01/06/23 2207)  diphenhydrAMINE  (BENADRYL ) injection 25 mg (25 mg Intravenous Given 01/06/23 2208)  iohexol  (OMNIPAQUE ) 300 MG/ML solution 100 mL (100 mLs Intravenous Contrast Given 01/06/23 2215)    ED Course/ Medical Decision Making/ A&P Clinical Course as of 01/06/23 2314  Tue Jan 06, 2023  2151 With nausea vomiting and diarrhea that started this morning, no significant abdominal tenderness, she is having some dysuria, labs show mild leukocytosis, slight increase in bilirubin at 1.5, mild hyperglycemia.  Has a mild leukocytosis which could be reactive from vomiting.  Unfortunately patient had multiple doses of nausea medicine both at home and here to the IV in the ED.  She been very tachycardic, slightly improved with several liters of IV fluid but not resolved and she is continued to vomit.  She initially felt better but after briefly sleeping she woke up and started vomiting profusely again, at this time we will obtain a CT abdomen pelvis to rule out acute process has a kidney stone with infection, obstruction, appendicitis and plan for admission for intractable nausea and vomiting [CB]    Clinical Course User Index [CB] Suellen Sherran LABOR, PA-C                                 Medical Decision Making This patient presents to the ED for concern of n/v/d, this involves an extensive number of treatment options, and is a complaint that carries with it a high risk of complications and morbidity.  The differential diagnosis includes gastritis, gastroenteritis, appendicitis, cholecystitis, diverticulitis, DKA, nephrolithiasis, gastroparesis, other  As  above patient's had multiple medications and IV fluids, CT pending, patient agreeable with admission if still able to tolerate p.o.  Signed out to Dr. Theadore  Amount and/or Complexity of Data Reviewed Labs:  ordered. Radiology: ordered.  Risk Prescription drug management.           Final Clinical Impression(s) / ED Diagnoses Final diagnoses:  None    Rx / DC Orders ED Discharge Orders     None         Suellen Sherran LABOR, PA-C 01/06/23 2314    Suellen Sherran LABOR, PA-C 01/06/23 2316    Ula Prentice SAUNDERS, MD 01/06/23 [phone]

## 2023-01-06 NOTE — ED Triage Notes (Signed)
Pt arrives from home with reports of NVD starting around 7 am today. Pt states that she has tried Zofran and Reglan with no relief. Denies any sick contacts.

## 2023-01-06 NOTE — ED Notes (Signed)
 AC called for Phenergan IV.

## 2023-01-06 NOTE — ED Provider Notes (Signed)
  Provider Note MRN:  [id number]  Arrival date & time: 01/07/23    ED Course and Medical Decision Making  Assumed care of patient at shift change. Persistent nausea vomiting and diarrhea with fever, difficulty controlling symptoms here in the emergency department, not tolerating p.o.  Awaiting CT imaging, may need admission.  2:30 AM update: Continued p.o. intolerance, significant symptoms, CT without acute process, suspect norovirus.  Continued tachycardia despite 3 L crystalloid.  Will admit to hospitalist.  Procedures  Final Clinical Impressions(s) / ED Diagnoses     ICD-10-CM   1. Nausea vomiting and diarrhea  R11.2    R19.7       ED Discharge Orders     None       Discharge Instructions   None     Ozell HERO. Theadore, MD [facility] [email or web address]    Theadore Ozell HERO, MD 01/07/23 [phone]

## 2023-01-06 NOTE — ED Notes (Signed)
While patient ambulating her oxygent sats dropped to 83% and 84%, not short of breath at time of ambulation

## 2023-01-07 DIAGNOSIS — R197 Diarrhea, unspecified: Secondary | ICD-10-CM | POA: Insufficient documentation

## 2023-01-07 DIAGNOSIS — I10 Essential (primary) hypertension: Secondary | ICD-10-CM

## 2023-01-07 DIAGNOSIS — E876 Hypokalemia: Secondary | ICD-10-CM | POA: Diagnosis present

## 2023-01-07 DIAGNOSIS — Z889 Allergy status to unspecified drugs, medicaments and biological substances status: Secondary | ICD-10-CM | POA: Diagnosis not present

## 2023-01-07 DIAGNOSIS — Z888 Allergy status to other drugs, medicaments and biological substances status: Secondary | ICD-10-CM | POA: Diagnosis not present

## 2023-01-07 DIAGNOSIS — Z823 Family history of stroke: Secondary | ICD-10-CM | POA: Diagnosis not present

## 2023-01-07 DIAGNOSIS — R7401 Elevation of levels of liver transaminase levels: Secondary | ICD-10-CM

## 2023-01-07 DIAGNOSIS — Z91012 Allergy to eggs: Secondary | ICD-10-CM | POA: Diagnosis not present

## 2023-01-07 DIAGNOSIS — Z833 Family history of diabetes mellitus: Secondary | ICD-10-CM | POA: Diagnosis not present

## 2023-01-07 DIAGNOSIS — R509 Fever, unspecified: Secondary | ICD-10-CM | POA: Diagnosis present

## 2023-01-07 DIAGNOSIS — E1165 Type 2 diabetes mellitus with hyperglycemia: Secondary | ICD-10-CM | POA: Diagnosis present

## 2023-01-07 DIAGNOSIS — R112 Nausea with vomiting, unspecified: Secondary | ICD-10-CM | POA: Diagnosis not present

## 2023-01-07 DIAGNOSIS — E66813 Obesity, class 3: Secondary | ICD-10-CM | POA: Diagnosis present

## 2023-01-07 DIAGNOSIS — N390 Urinary tract infection, site not specified: Secondary | ICD-10-CM | POA: Diagnosis present

## 2023-01-07 DIAGNOSIS — Z880 Allergy status to penicillin: Secondary | ICD-10-CM | POA: Diagnosis not present

## 2023-01-07 DIAGNOSIS — Z887 Allergy status to serum and vaccine status: Secondary | ICD-10-CM | POA: Diagnosis not present

## 2023-01-07 DIAGNOSIS — A419 Sepsis, unspecified organism: Secondary | ICD-10-CM | POA: Diagnosis present

## 2023-01-07 DIAGNOSIS — R6510 Systemic inflammatory response syndrome (SIRS) of non-infectious origin without acute organ dysfunction: Secondary | ICD-10-CM | POA: Diagnosis not present

## 2023-01-07 DIAGNOSIS — J45909 Unspecified asthma, uncomplicated: Secondary | ICD-10-CM | POA: Diagnosis present

## 2023-01-07 DIAGNOSIS — E782 Mixed hyperlipidemia: Secondary | ICD-10-CM | POA: Diagnosis present

## 2023-01-07 DIAGNOSIS — K219 Gastro-esophageal reflux disease without esophagitis: Secondary | ICD-10-CM | POA: Diagnosis present

## 2023-01-07 DIAGNOSIS — Z8249 Family history of ischemic heart disease and other diseases of the circulatory system: Secondary | ICD-10-CM | POA: Diagnosis not present

## 2023-01-07 DIAGNOSIS — Z881 Allergy status to other antibiotic agents status: Secondary | ICD-10-CM | POA: Diagnosis not present

## 2023-01-07 DIAGNOSIS — Z87891 Personal history of nicotine dependence: Secondary | ICD-10-CM | POA: Diagnosis not present

## 2023-01-07 DIAGNOSIS — K529 Noninfective gastroenteritis and colitis, unspecified: Secondary | ICD-10-CM | POA: Diagnosis present

## 2023-01-07 DIAGNOSIS — Z794 Long term (current) use of insulin: Secondary | ICD-10-CM | POA: Diagnosis not present

## 2023-01-07 DIAGNOSIS — Z6841 Body Mass Index (BMI) 40.0 and over, adult: Secondary | ICD-10-CM | POA: Diagnosis not present

## 2023-01-07 DIAGNOSIS — Z825 Family history of asthma and other chronic lower respiratory diseases: Secondary | ICD-10-CM | POA: Diagnosis not present

## 2023-01-07 LAB — PHOSPHORUS: Phosphorus: 2.8 mg/dL (ref 2.5–4.6)

## 2023-01-07 LAB — CBC
HCT: 43.6 % (ref 36.0–46.0)
Hemoglobin: 15.1 g/dL — ABNORMAL HIGH (ref 12.0–15.0)
MCH: 31.2 pg (ref 26.0–34.0)
MCHC: 34.6 g/dL (ref 30.0–36.0)
MCV: 90.1 fL (ref 80.0–100.0)
Platelets: 178 10*3/uL (ref 150–400)
RBC: 4.84 MIL/uL (ref 3.87–5.11)
RDW: 13.4 % (ref 11.5–15.5)
WBC: 7.1 10*3/uL (ref 4.0–10.5)
nRBC: 0 % (ref 0.0–0.2)

## 2023-01-07 LAB — GLUCOSE, CAPILLARY
Glucose-Capillary: 134 mg/dL — ABNORMAL HIGH (ref 70–99)
Glucose-Capillary: 106 mg/dL — ABNORMAL HIGH (ref 70–99)
Glucose-Capillary: 118 mg/dL — ABNORMAL HIGH (ref 70–99)

## 2023-01-07 LAB — CREATININE, SERUM
Creatinine, Ser: 0.62 mg/dL (ref 0.44–1.00)
GFR, Estimated: >60 mL/min (ref >60)

## 2023-01-07 LAB — C DIFFICILE QUICK SCREEN W PCR REFLEX
C Diff antigen: NEGATIVE
C Diff interpretation: NOT DETECTED
C Diff toxin: NEGATIVE

## 2023-01-07 LAB — MAGNESIUM: Magnesium: 1.5 mg/dL — ABNORMAL LOW (ref 1.7–2.4)

## 2023-01-07 LAB — TROPONIN I (HIGH SENSITIVITY): Troponin I (High Sensitivity): 5 ng/L (ref <18)

## 2023-01-07 MED ORDER — SODIUM CHLORIDE 0.9 % IV SOLN
1.0000 g | INTRAVENOUS | Status: DC
  Administered 2023-01-07: 1 g via INTRAVENOUS
  Filled 2023-01-07: qty 10

## 2023-01-07 MED ORDER — PANTOPRAZOLE SODIUM 40 MG IV SOLR
40.0000 mg | INTRAVENOUS | Status: DC
  Administered 2023-01-07: 40 mg via INTRAVENOUS
  Filled 2023-01-07: qty 10

## 2023-01-07 MED ORDER — DAPAGLIFLOZIN PROPANEDIOL 5 MG PO TABS
5.0000 mg | ORAL_TABLET | Freq: Every day | ORAL | Status: DC
  Administered 2023-01-07: 5 mg via ORAL
  Filled 2023-01-07: qty 1

## 2023-01-07 MED ORDER — ENOXAPARIN SODIUM 60 MG/0.6ML IJ SOSY
60.0000 mg | PREFILLED_SYRINGE | INTRAMUSCULAR | Status: DC
  Administered 2023-01-07 – 2023-01-08 (×2): 60 mg via SUBCUTANEOUS
  Filled 2023-01-07 (×2): qty 0.6

## 2023-01-07 MED ORDER — INSULIN ASPART 100 UNIT/ML IJ SOLN
0.0000 [IU] | Freq: Three times a day (TID) | INTRAMUSCULAR | Status: DC
Start: 2023-01-07 — End: 2023-01-08
  Administered 2023-01-08: 2 [IU] via SUBCUTANEOUS

## 2023-01-07 MED ORDER — PROMETHAZINE HCL 25 MG/ML IJ SOLN
INTRAMUSCULAR | Status: AC
  Filled 2023-01-07: qty 1

## 2023-01-07 MED ORDER — ACETAMINOPHEN 500 MG PO TABS
1000.0000 mg | ORAL_TABLET | Freq: Four times a day (QID) | ORAL | Status: DC | PRN
  Administered 2023-01-07: 1000 mg via ORAL
  Filled 2023-01-07: qty 2

## 2023-01-07 MED ORDER — ORAL CARE MOUTH RINSE: 15.0000 mL | OROMUCOSAL | Status: DC | PRN

## 2023-01-07 MED ORDER — LACTATED RINGERS IV SOLN: INTRAVENOUS | Status: AC

## 2023-01-07 MED ORDER — SODIUM CHLORIDE 0.9 % IV SOLN
25.0000 mg | Freq: Four times a day (QID) | INTRAVENOUS | Status: DC | PRN
  Administered 2023-01-07: 25 mg via INTRAVENOUS
  Filled 2023-01-07: qty 1

## 2023-01-07 MED ORDER — HYDROCHLOROTHIAZIDE 12.5 MG PO TABS
12.5000 mg | ORAL_TABLET | Freq: Every day | ORAL | Status: DC
  Administered 2023-01-07: 12.5 mg via ORAL
  Filled 2023-01-07: qty 1

## 2023-01-07 MED ORDER — PNEUMOCOCCAL 20-VAL CONJ VACC 0.5 ML IM SUSY: 0.5000 mL | PREFILLED_SYRINGE | INTRAMUSCULAR | Status: DC

## 2023-01-07 NOTE — H&P (Signed)
 History and Physical    Patient: Cheryl Black FMW:[id number] DOB: 1985/06/08 DOA: 01/06/2023 DOS: the patient was seen and examined on 01/07/2023 PCP: Shona Norleen PEDLAR, MD  Patient coming from: Home  Chief Complaint:  Chief Complaint  Patient presents with   Emesis   HPI: Cheryl Black is a 38 y.o. female with medical history significant of hyperlipidemia, T2DM, GERD, asthma, obesity class III who presented to the emergency department due to 1 day onset of nausea, vomiting, diarrhea associated with fever.  Zofran  tried at home did not resolve the nausea and vomiting, there was no relief with Reglan  either.  She denies chest pain, shortness of breath.  Diarrhea was watery in nature and was many episodes during the last 24 hours.  She presented to the ED for further evaluation and management.  ED Course:  In the emergency department, she was febrile with a temperature of 100.6 F, pulse 139 bpm, respiratory rate 20/min, O2 sat 98% on room air.  Workup in the ED showed WBC 10.8, hemoglobin 19.0, hematocrit 56.4, platelets 247, MCV 88.8.  BMP was normal except for sodium of 134, bicarb 20, blood glucose 183, AST 53, ALT 78, ALP 59, total bilirubin 1.5 Urinalysis was positive for glycosuria but unimpressive for UTI.  Influenza A, B, SARS coronavirus 2, RSV was negative. CT abdomen and pelvis showed no acute intra-abdominal or intrapelvic abnormality. Indeterminate interval development of a 1.1 x 0.7 cm peri splenic fluid density. 3 L of IV fluid was given patient was provided with antiemetics, Pepcid  and Toradol  were given.  IV Rocephin  1 g x 1 for presumed UTI was given.  Hospitalist was asked to admit patient for further evaluation and management.    Review of Systems: Review of systems as noted in the HPI. All other systems reviewed and are negative.   Past Medical History:  Diagnosis Date   Acid reflux    Asthma    Clostridium difficile infection    in the past   Diabetes mellitus     Fatty liver    Fibrocystic breast disease    Gout    Hidradenitis    History of kidney stones    Hyperlipemia    Obesity    Polycystic ovary syndrome    PONV (postoperative nausea and vomiting)    Respiratory failure (HCC)    2018 and 2019   Sleep apnea    Smoker    Past Surgical History:  Procedure Laterality Date   CHOLECYSTECTOMY  01/10/2011   Procedure: LAPAROSCOPIC CHOLECYSTECTOMY;  Surgeon: Oneil DELENA Budge;  Location: [facility];  Service: General;  Laterality: N/A;   ESOPHAGOGASTRODUODENOSCOPY (EGD) WITH PROPOFOL  N/A 09/09/2018   RMR: Mild erosive reflux esophagitis.  Esophagus dilated for history of dysphagia.   HYDRADENITIS EXCISION Right 06/18/2012   Procedure: EXCISION HYDRIADENITIS SUPRATIVA  RIGHT AXILLA;  Surgeon: Elsie GORMAN Holland, MD;  Location: [facility];  Service: General;  Laterality: Right;   LIVER BIOPSY  01/10/2011   Procedure: LIVER BIOPSY;  Surgeon: Oneil DELENA Budge;  Location: [facility];  Service: General;;   MALONEY DILATION N/A 09/09/2018   Procedure: AGAPITO DILATION;  Surgeon: Shaaron Lamar HERO, MD;  Location: [facility];  Service: Endoscopy;  Laterality: N/A;   ROBOTIC ASSISTED TOTAL HYSTERECTOMY WITH BILATERAL SALPINGO OOPHERECTOMY Bilateral 09/05/2022   Procedure: XI ROBOTIC ASSISTED TOTAL HYSTERECTOMY WITH BILATERAL SALPINGO;  Surgeon: Jayne Vonn DEL, MD;  Location: [facility];  Service: Gynecology;  Laterality: Bilateral;   SKIN SPLIT GRAFT Right 06/18/2012  Procedure: SKIN GRAFT SPLIT THICKNESS RIGHT AXILLA(WITH TWO STANDARD SKIN BOARDS 3x 8 )  DONOR SITE RIGHT AND LEFT THIGHS;  Surgeon: Elsie GORMAN Holland, MD;  Location: [facility];  Service: General;  Laterality: Right;    Social History:  reports that she quit smoking about 5 years ago. Her smoking use included cigarettes. She started smoking about 15 years ago. She has a 10 pack-year smoking history. She has been exposed to tobacco smoke. She has never used smokeless tobacco. She reports that she does not currently use  alcohol. She reports that she does not use drugs.   Allergies  Allergen Reactions   Ace Inhibitors Anaphylaxis   Beta Adrenergic Blockers Other (See Comments)    Angioedema  Other reaction(s): Angioedema   Clindamycin /Lincomycin Hives   Augmentin  [Amoxicillin -Pot Clavulanate] Other (See Comments)    Has had C.diff in the past Did it involve swelling of the face/tongue/throat, SOB, or low BP? No Did it involve sudden or severe rash/hives, skin peeling, or any reaction on the inside of your mouth or nose? No Did you need to seek medical attention at a hospital or doctor's office? No When did it last happen?Unknown If all above answers are "NO", may proceed with cephalosporin use.    Benicar [Olmesartan] Swelling    Facial edema   Doxycycline Other (See Comments)    Makes feet and hands red   Egg-Derived Products     Local reaction with injectable meds that contain eggs   Influenza Vaccine Live Swelling    Arm    Influenza Virus Vaccine Hives   Invokana [Canagliflozin] Other (See Comments)    Dehydration    Liraglutide Nausea And Vomiting    Family History  Problem Relation Age of Onset   Other Mother        pre-cancerous cells; had hyst   Emphysema Mother        smoker   COPD Mother    Diabetes Father    Hypertension Father    Cancer Sister        cervical; had hyst   Gout Brother    Hypertension Maternal Grandmother    Heart attack Maternal Grandfather    Cancer Maternal Grandfather        adenocarcinoma   Emphysema Maternal Grandfather        smoked   Hypertension Paternal Grandmother    Diabetes Paternal Grandmother    Gout Paternal Grandmother    Asthma Maternal Uncle    Stroke Other        paternal great grandfather   Colon cancer Neg Hx    Liver disease Neg Hx    Liver cancer Neg Hx      Prior to Admission medications   Medication Sig Start Date End Date Taking? Authorizing Provider  acetaminophen  (TYLENOL ) 500 MG tablet Take 1,000 mg by mouth  every 6 (six) hours as needed for moderate pain or headache.    [provider]  albuterol  (PROVENTIL ) (2.5 MG/3ML) 0.083% nebulizer solution Inhale 3 mLs (2.5 mg total) by nebulization every 2 (two) hours as needed for wheezing or shortness of breath. 03/27/22   Juvenal Harlene PENNER, DO  albuterol  (VENTOLIN  HFA) 108 (90 Base) MCG/ACT inhaler Inhale 2 puffs into the lungs every 4 (four) - 6 (six)  hours as needed for shortness of breath. 01/10/22     allopurinol  (ZYLOPRIM ) 300 MG tablet Take 1 tablet (300 mg total) by mouth daily. 06/30/22     Azelastine -Fluticasone  137-50 MCG/ACT SUSP Place  1 spray into the nose in the morning and at bedtime. 04/15/22   Desai, Nikita S, MD  BIOTIN PO Take 1 tablet by mouth daily.    [provider]  Blood Glucose Monitoring Suppl (FREESTYLE LITE) w/Device KIT Use to test blood sugar 01/10/22     colchicine  0.6 MG tablet Take 2 tablets by mouth now, then take 1 tablet by mouth 6 hours later as directed. 11/27/22     Continuous Glucose Sensor (DEXCOM G7 SENSOR) MISC Change every 10 days as directed 12/29/22     dapagliflozin  propanediol (FARXIGA ) 5 MG TABS tablet Take 1 tablet (5 mg total) by mouth daily. 12/29/22     fluticasone -salmeterol (ADVAIR  HFA) 115-21 MCG/ACT inhaler Inhale 2 puffs into the lungs 2 (two) times daily. 12/10/22   Meade Verdon RAMAN, MD  Glucagon  (GVOKE HYPOPEN  2-PACK) 1 MG/0.2ML SOAJ use under the skin for hypoglycemia as needed 01/31/22     glucose blood test strip Use 2-3 times daily to test blood sugar 01/10/22     hydrochlorothiazide  (HYDRODIURIL ) 12.5 MG tablet Take 1 tablet (12.5 mg total) by mouth daily for high blood pressure. 07/04/22     Insulin  Pen Needle 32G X 4 MM MISC Use as directed to inject insulin  4 times a day 01/10/22     Lancets (ONETOUCH DELICA PLUS LANCET33G) MISC Use to test blood sugar 2-3 times daily 01/10/22     loratadine  (CLARITIN ) 10 MG tablet Take 1 tablet (10 mg total) by mouth daily. 04/15/22   Meade Verdon RAMAN, MD   metFORMIN  (GLUCOPHAGE -XR) 500 MG 24 hr tablet Take 2 tablets (1,000 mg total) by mouth daily. 07/28/22     metoCLOPramide  (REGLAN ) 10 MG tablet Take 1 tablet (10 mg total) by mouth every 8 (eight) hours as needed for nausea. 07/11/22   Arrien, Mauricio Daniel, MD  pantoprazole  (PROTONIX ) 40 MG tablet Take 1 tablet (40 mg total) by mouth 2 (two) times daily. 07/04/22     potassium chloride  (KLOR-CON ) 10 MEQ tablet Take 1 tablet (10 mEq total) by mouth daily. 07/04/22     pravastatin  (PRAVACHOL ) 20 MG tablet Take 1 tablet (20 mg total) by mouth Nightly for cholesterol Patient taking differently: Take 20 mg by mouth See admin instructions. Takes 4 times during the week (sun-wed) 07/04/22     Probiotic, Lactobacillus, CAPS Take 1 capsule by mouth daily. 11/28/19   [provider]  tirzepatide  (MOUNJARO ) 2.5 MG/0.5ML Pen Inject 2.5 mg into the skin once a week. Patient not taking: Reported on 10/24/2022 05/06/22     tirzepatide  (MOUNJARO ) 5 MG/0.5ML Pen Inject 5 mg into the skin once a week. 11/11/22     VITAMIN D  PO Take 1 tablet by mouth daily.    [provider]    Physical Exam: BP 130/77 (BP Location: Right Wrist)   Pulse 97   Temp 98.6 F (37 C) (Oral)   Resp 15   Ht 5' 8 (1.727 m)   Wt 122.5 kg   LMP 12/12/2021 (Exact Date)   SpO2 93%   BMI 41.05 kg/m   General: 38 y.o. year-old female well developed well nourished in no acute distress.  Alert and oriented x3. HEENT: NCAT, EOMI Neck: Supple, trachea medial Cardiovascular: Regular rate and rhythm with no rubs or gallops.  No thyromegaly or JVD noted.  No lower extremity edema. 2/4 pulses in all 4 extremities. Respiratory: Clear to auscultation with no wheezes or rales. Good inspiratory effort. Abdomen: Soft, nontender nondistended with normal bowel sounds  x4 quadrants. Muskuloskeletal: No cyanosis, clubbing or edema noted bilaterally Neuro: CN II-XII intact, strength 5/5 x 4, sensation, reflexes intact Skin: No  ulcerative lesions noted or rashes Psychiatry: Judgement and insight appear normal. Mood is appropriate for condition and setting          Labs on Admission:  Basic Metabolic Panel: Recent Labs  Lab 01/06/23 1743  NA 134*  K 3.7  CL 105  CO2 20*  GLUCOSE 183*  BUN 15  CREATININE 0.79  CALCIUM  8.9   Liver Function Tests: Recent Labs  Lab 01/06/23 1743  AST 53*  ALT 78*  ALKPHOS 59  BILITOT 1.5*  PROT 7.7  ALBUMIN 4.0   Recent Labs  Lab 01/06/23 1743  LIPASE 26   No results for input(s): AMMONIA in the last 168 hours. CBC: Recent Labs  Lab 01/06/23 1743  WBC 10.8*  NEUTROABS 10.1*  HGB 19.0*  HCT 56.4*  MCV 88.8  PLT 247   Cardiac Enzymes: No results for input(s): CKTOTAL, CKMB, CKMBINDEX, TROPONINI in the last 168 hours.  BNP (last 3 results) Recent Labs    03/25/22 2355  BNP 10.0    ProBNP (last 3 results) No results for input(s): PROBNP in the last 8760 hours.  CBG: No results for input(s): GLUCAP in the last 168 hours.  Radiological Exams on Admission: CT ABDOMEN PELVIS W CONTRAST Result Date: 01/06/2023 CLINICAL DATA:  Abdominal pain, acute, nonlocalized NVD starting around 7 am today. Pt states that she has tried Zofran  and Reglan  with no relief. Denies any sick contacts. EXAM: CT ABDOMEN AND PELVIS WITH CONTRAST TECHNIQUE: Multidetector CT imaging of the abdomen and pelvis was performed using the standard protocol following bolus administration of intravenous contrast. RADIATION DOSE REDUCTION: This exam was performed according to the departmental dose-optimization program which includes automated exposure control, adjustment of the mA and/or kV according to patient size and/or use of iterative reconstruction technique. CONTRAST:  OMNIPAQUE  IOHEXOL  300 MG/ML  SOLN COMPARISON:  CT abdomen pelvis 01/31/2021 FINDINGS: Lower chest: Lingular and bilateral lower lobe atelectasis. No acute abnormality. Hepatobiliary: No focal liver  abnormality. Status post cholecystectomy. No biliary dilatation. Pancreas: No focal lesion. Normal pancreatic contour. No surrounding inflammatory changes. No main pancreatic ductal dilatation. Spleen: Normal in size without focal abnormality. Adrenals/Urinary Tract: No adrenal nodule bilaterally. Bilateral kidneys enhance symmetrically. No hydronephrosis. No hydroureter. The urinary bladder is unremarkable. Stomach/Bowel: Stomach is within normal limits. No evidence of bowel wall thickening or dilatation. Appendix appears normal. Vascular/Lymphatic: No abdominal aorta or iliac aneurysm. Stable prominent nonspecific aortocaval lymph node (2:40). No abdominal, pelvic, or inguinal lymphadenopathy. Reproductive: Status post hysterectomy. No adnexal masses. Bilateral ovaries are unremarkable. Other: Interval development of an indeterminate 1.1 x 0.7 cm perisplenic fluid density. No intraperitoneal free gas. No organized fluid collection. Musculoskeletal: No abdominal wall hernia or abnormality. No suspicious lytic or blastic osseous lesions. No acute displaced fracture. Multilevel degenerative changes of the spine. IMPRESSION: 1. No acute intra-abdominal or intrapelvic abnormality. 2. Indeterminate interval development of a 1.1 x 0.7 cm peri splenic fluid density. Again attention on follow-up Electronically Signed   By: Morgane  Naveau M.D.   On: 01/06/2023 23:55    EKG: I independently viewed the EKG done and my findings are as followed: Sinus tachycardia at a rate of 127 bpm  Assessment/Plan Present on Admission:  Acute febrile illness  Nausea & vomiting  Transaminitis  Mixed hyperlipidemia  Essential hypertension  GERD (gastroesophageal reflux disease)  Principal Problem:   Acute  febrile illness Active Problems:   Essential hypertension   GERD (gastroesophageal reflux disease)   Mixed hyperlipidemia   Transaminitis   Nausea & vomiting   Diarrhea   SIRS (systemic inflammatory response syndrome)  (HCC)   Type 2 diabetes mellitus with hyperglycemia (HCC)   Obesity, Class III, BMI 40-49.9 (morbid obesity) (HCC)  Acute febrile illness in the setting of nausea, vomiting, diarrhea This may be due to viral gastroenteritis Patient was febrile on arrival to the ED. She has not had diarrhea since arrival to the ED Continue IV Zofran  p.r.n. Continue IV hydration Patient will be started on clear liquid diet in the morning with plan to advance as tolerated C. difficile and GI stool panel will be obtained to rule out infectious diarrhea  SIRS Patient met SIRS criteria due to being febrile and tachycardic No obvious source of infection at this time other than diarrhea that is pending stool workup to rule out infectious diarrhea Continue management as described above  Type 2 diabetes mellitus with hyperglycemia Hemoglobin A1c on 09/03/2022 was 7.6 Continue ISS and hypoglycemia protocol Continue Farxiga   Elevated hemoglobin/hematocrit possibly due to hemoconcentration IV hydration was provided Continue to monitor H/H with morning labs  Transaminitis CT abdomen and pelvis showed no acute intra-abdominal abnormality. Continue to monitor liver enzymes  Hyperlipidemia Hold statin at this time due to transaminitis  GERD Continue Protonix   Essential hypertension Continue HCTZ  Class III obesity (BMI of 41.05) Patient was counseled about the cardiovascular and metabolic risk of morbid obesity. Patient was counseled for diet control, exercise regimen and weight loss.    DVT prophylaxis: Lovenox   Code Status: Full code  Family Communication: None at bedside  Consults: None  Severity of Illness: The appropriate patient status for this patient is INPATIENT. Inpatient status is judged to be reasonable and necessary in order to provide the required intensity of service to ensure the patient's safety. The patient's presenting symptoms, physical exam findings, and initial radiographic  and laboratory data in the context of their chronic comorbidities is felt to place them at high risk for further clinical deterioration. Furthermore, it is not anticipated that the patient will be medically stable for discharge from the hospital within 2 midnights of admission.   * I certify that at the point of admission it is my clinical judgment that the patient will require inpatient hospital care spanning beyond 2 midnights from the point of admission due to high intensity of service, high risk for further deterioration and high frequency of surveillance required.*  Author: Caly Pellum, DO 01/07/2023 8:46 AM  For on call review [email or web address].

## 2023-01-07 NOTE — Plan of Care (Signed)
  Problem: Education: Goal: Knowledge of General Education information will improve Description: Including pain rating scale, medication(s)/side effects and non-pharmacologic comfort measures Outcome: Progressing   Problem: Health Behavior/Discharge Planning: Goal: Ability to manage health-related needs will improve Outcome: Progressing   Problem: Activity: Goal: Risk for activity intolerance will decrease Outcome: Progressing   Problem: Elimination: Goal: Will not experience complications related to bowel motility Outcome: Not Progressing

## 2023-01-07 NOTE — Progress Notes (Addendum)
 Progress Note   Patient: Cheryl Black FMW:[id number] DOB: Jan 10, 1985 DOA: 01/06/2023     0 DOS: the patient was seen and examined on 01/07/2023   Brief hospital course: Cheryl Black is a 38 y.o. female with medical history significant of hyperlipidemia, T2DM, GERD, asthma, obesity class III who presented to the emergency department due to 1 day onset of nausea, vomiting, diarrhea associated with fever.  Zofran  tried at home did not resolve the nausea and vomiting, there was no relief with Reglan  either.  She denies chest pain, shortness of breath.  Diarrhea was watery in nature and was many episodes on 01/06/2023.  She presented to the ED for further evaluation and management.   temperature of 100.6 F, pulse 139 bpm,  CT abdomen and pelvis showed no acute intra-abdominal or intrapelvic abnormality. Indeterminate interval development of a 1.1 x 0.7 cm peri splenic fluid density.   3 L of IV fluid was given patient was provided with antiemetics, Pepcid  and Toradol  were given.  IV Rocephin  1 g x 1 for presumed UTI   Assessment and Plan:   gastroenteritis As of this afternoon, patient's vomiting has abated, although patient is still not tolerating p.o. diet.  Patient had only 1 loose bowel movement since this morning Sunrise.  Patient is pending collection for C. difficile and stool pathogen. No more fevers. She has not had diarrhea since arrival to the ED Continue IV Zofran  p.r.n. Continue IV hydration Patient will be started on clear liquid diet in the morning with plan to advance as tolerated C. difficile and GI stool panel will be obtained to rule out infectious diarrhea Given SIRS critieria - c.w. ceftrixone.  for above and uti   Type 2 diabetes mellitus with hyperglycemia Hemoglobin A1c on 09/03/2022 was 7.6 Continue ISS and hypoglycemia protocol Continue Farxiga    Elevated hemoglobin/hematocrit possibly due to hemoconcentration IV hydration was provided Continue to monitor H/H  with morning labs   Transaminitis CT abdomen and pelvis showed no acute intra-abdominal abnormality. Acute hepatitis panel pending.   Hyperlipidemia Hold statin at this time due to transaminitis   GERD Continue Protonix  iv for now.   Essential hypertension hold hydrochlorothiazide . Soft bp     Class III obesity (BMI of 41.05) Per admiting doc :Patient was counseled about the cardiovascular and metabolic risk of morbid obesity. Patient was counseled for diet control, exercise regimen and weight loss.   Hypoxia -documentation of ER staff, patient was hypoxic to ambulation yesterday to 83% on room air.  At this time patient reports no symptoms with ambulation, I suspect patient may have had mild aspiration pneumonia that was not apparent on the chest x-ray.  Continue with ceftriaxone .  We will recheck ambulatory pulse oximetry today.  Indeterminate interval development of a 1.1 x 0.7 cm peri splenic fluid density.  Noted incidentally on CAT scan.  I think this is due to inflammation from patient enteritis/colitis.  I would suggest interval repeat imaging.     Subjective: Has had only single bowel movement since otherwise this morning.  No vomiting.  However patient is not having any appetite to tolerate diet.  No chest pain no shortness of breath no belly pain  Physical Exam: Vitals:   01/07/23 0630 01/07/23 0735 01/07/23 0800 01/07/23 1122  BP: 107/63 130/77  92/62  Pulse: 95  97   Resp: 19  15   Temp:  98.6 F (37 C)  98.3 F (36.8 C)  TempSrc:  Oral  Oral  SpO2: 92%  93%    Weight:      Height:       General: Patient is alert and awake on room air in no distress, however appears to be fatigued. Respiratory exam: Bilateral intravesicular Cardiovascular exam S1-S2 normal Abdomen all quadrants soft nontender Extremities warm without edema Data Reviewed:  Labs on Admission:  Results for orders placed or performed during the hospital encounter of 01/06/23 (from the past 24  hours)  CBC with Differential     Status: Abnormal   Collection Time: 01/06/23  5:43 PM  Result Value Ref Range   WBC 10.8 (H) 4.0 - 10.5 K/uL   RBC 6.35 (H) 3.87 - 5.11 MIL/uL   Hemoglobin 19.0 (H) 12.0 - 15.0 g/dL   HCT 43.5 (H) 63.9 - 53.9 %   MCV 88.8 80.0 - 100.0 fL   MCH 29.9 26.0 - 34.0 pg   MCHC 33.7 30.0 - 36.0 g/dL   RDW 86.7 88.4 - 84.4 %   Platelets 247 150 - 400 K/uL   nRBC 0.0 0.0 - 0.2 %   Neutrophils Relative % 93 %   Neutro Abs 10.1 (H) 1.7 - 7.7 K/uL   Lymphocytes Relative 4 %   Lymphs Abs 0.4 (L) 0.7 - 4.0 K/uL   Monocytes Relative 3 %   Monocytes Absolute 0.3 0.1 - 1.0 K/uL   Eosinophils Relative 0 %   Eosinophils Absolute 0.0 0.0 - 0.5 K/uL   Basophils Relative 0 %   Basophils Absolute 0.0 0.0 - 0.1 K/uL   Immature Granulocytes 0 %   Abs Immature Granulocytes 0.03 0.00 - 0.07 K/uL  Comprehensive metabolic panel     Status: Abnormal   Collection Time: 01/06/23  5:43 PM  Result Value Ref Range   Sodium 134 (L) 135 - 145 mmol/L   Potassium 3.7 3.5 - 5.1 mmol/L   Chloride 105 98 - 111 mmol/L   CO2 20 (L) 22 - 32 mmol/L   Glucose, Bld 183 (H) 70 - 99 mg/dL   BUN 15 6 - 20 mg/dL   Creatinine, Ser 9.20 0.44 - 1.00 mg/dL   Calcium  8.9 8.9 - 10.3 mg/dL   Total Protein 7.7 6.5 - 8.1 g/dL   Albumin 4.0 3.5 - 5.0 g/dL   AST 53 (H) 15 - 41 U/L   ALT 78 (H) 0 - 44 U/L   Alkaline Phosphatase 59 38 - 126 U/L   Total Bilirubin 1.5 (H) 0.0 - 1.2 mg/dL   GFR, Estimated >39 >39 mL/min   Anion gap 9 5 - 15  Lipase, blood     Status: None   Collection Time: 01/06/23  5:43 PM  Result Value Ref Range   Lipase 26 11 - 51 U/L  Resp panel by RT-PCR (RSV, Flu A&B, Covid) Anterior Nasal Swab     Status: None   Collection Time: 01/06/23  6:14 PM   Specimen: Anterior Nasal Swab  Result Value Ref Range   SARS Coronavirus 2 by RT PCR NEGATIVE NEGATIVE   Influenza A by PCR NEGATIVE NEGATIVE   Influenza B by PCR NEGATIVE NEGATIVE   Resp Syncytial Virus by PCR NEGATIVE  NEGATIVE  Urinalysis, Routine w reflex microscopic -Urine, Clean Catch     Status: Abnormal   Collection Time: 01/06/23  6:52 PM  Result Value Ref Range   Color, Urine AMBER (A) YELLOW   APPearance CLOUDY (A) CLEAR   Specific Gravity, Urine 1.020 1.005 - 1.030   pH 5.0 5.0 - 8.0  Glucose, UA >=500 (A) NEGATIVE mg/dL   Hgb urine dipstick SMALL (A) NEGATIVE   Bilirubin Urine NEGATIVE NEGATIVE   Ketones, ur NEGATIVE NEGATIVE mg/dL   Protein, ur 30 (A) NEGATIVE mg/dL   Nitrite NEGATIVE NEGATIVE   Leukocytes,Ua MODERATE (A) NEGATIVE   RBC / HPF 21-50 0 - 5 RBC/hpf   WBC, UA >50 0 - 5 WBC/hpf   Bacteria, UA FEW (A) NONE SEEN   Squamous Epithelial / HPF 11-20 0 - 5 /HPF   Mucus PRESENT    Budding Yeast PRESENT   CBC     Status: Abnormal   Collection Time: 01/07/23  9:10 AM  Result Value Ref Range   WBC 7.1 4.0 - 10.5 K/uL   RBC 4.84 3.87 - 5.11 MIL/uL   Hemoglobin 15.1 (H) 12.0 - 15.0 g/dL   HCT 56.3 63.9 - 53.9 %   MCV 90.1 80.0 - 100.0 fL   MCH 31.2 26.0 - 34.0 pg   MCHC 34.6 30.0 - 36.0 g/dL   RDW 86.5 88.4 - 84.4 %   Platelets 178 150 - 400 K/uL   nRBC 0.0 0.0 - 0.2 %  Creatinine, serum     Status: None   Collection Time: 01/07/23  9:10 AM  Result Value Ref Range   Creatinine, Ser 0.62 0.44 - 1.00 mg/dL   GFR, Estimated >39 >39 mL/min  Magnesium      Status: Abnormal   Collection Time: 01/07/23  9:10 AM  Result Value Ref Range   Magnesium  1.5 (L) 1.7 - 2.4 mg/dL  Phosphorus     Status: None   Collection Time: 01/07/23  9:10 AM  Result Value Ref Range   Phosphorus 2.8 2.5 - 4.6 mg/dL  Glucose, capillary     Status: Abnormal   Collection Time: 01/07/23 11:21 AM  Result Value Ref Range   Glucose-Capillary 134 (H) 70 - 99 mg/dL   Basic Metabolic Panel: Recent Labs  Lab 01/06/23 1743 01/07/23 0910  NA 134*  --   K 3.7  --   CL 105  --   CO2 20*  --   GLUCOSE 183*  --   BUN 15  --   CREATININE 0.79 0.62  CALCIUM  8.9  --   MG  --  1.5*  PHOS  --  2.8   Liver  Function Tests: Recent Labs  Lab 01/06/23 1743  AST 53*  ALT 78*  ALKPHOS 59  BILITOT 1.5*  PROT 7.7  ALBUMIN 4.0   Recent Labs  Lab 01/06/23 1743  LIPASE 26   No results for input(s): AMMONIA in the last 168 hours. CBC: Recent Labs  Lab 01/06/23 1743 01/07/23 0910  WBC 10.8* 7.1  NEUTROABS 10.1*  --   HGB 19.0* 15.1*  HCT 56.4* 43.6  MCV 88.8 90.1  PLT 247 178   Cardiac Enzymes: No results for input(s): CKTOTAL, CKMB, CKMBINDEX, TROPONINIHS in the last 168 hours.  BNP (last 3 results) No results for input(s): PROBNP in the last 8760 hours. CBG: Recent Labs  Lab 01/07/23 1121  GLUCAP 134*    Radiological Exams on Admission:  CT ABDOMEN PELVIS W CONTRAST Result Date: 01/06/2023 CLINICAL DATA:  Abdominal pain, acute, nonlocalized NVD starting around 7 am today. Pt states that she has tried Zofran  and Reglan  with no relief. Denies any sick contacts. EXAM: CT ABDOMEN AND PELVIS WITH CONTRAST TECHNIQUE: Multidetector CT imaging of the abdomen and pelvis was performed using the standard protocol following bolus administration of intravenous contrast. RADIATION DOSE  REDUCTION: This exam was performed according to the departmental dose-optimization program which includes automated exposure control, adjustment of the mA and/or kV according to patient size and/or use of iterative reconstruction technique. CONTRAST:  OMNIPAQUE  IOHEXOL  300 MG/ML  SOLN COMPARISON:  CT abdomen pelvis 01/31/2021 FINDINGS: Lower chest: Lingular and bilateral lower lobe atelectasis. No acute abnormality. Hepatobiliary: No focal liver abnormality. Status post cholecystectomy. No biliary dilatation. Pancreas: No focal lesion. Normal pancreatic contour. No surrounding inflammatory changes. No main pancreatic ductal dilatation. Spleen: Normal in size without focal abnormality. Adrenals/Urinary Tract: No adrenal nodule bilaterally. Bilateral kidneys enhance symmetrically. No hydronephrosis.  No hydroureter. The urinary bladder is unremarkable. Stomach/Bowel: Stomach is within normal limits. No evidence of bowel wall thickening or dilatation. Appendix appears normal. Vascular/Lymphatic: No abdominal aorta or iliac aneurysm. Stable prominent nonspecific aortocaval lymph node (2:40). No abdominal, pelvic, or inguinal lymphadenopathy. Reproductive: Status post hysterectomy. No adnexal masses. Bilateral ovaries are unremarkable. Other: Interval development of an indeterminate 1.1 x 0.7 cm perisplenic fluid density. No intraperitoneal free gas. No organized fluid collection. Musculoskeletal: No abdominal wall hernia or abnormality. No suspicious lytic or blastic osseous lesions. No acute displaced fracture. Multilevel degenerative changes of the spine. IMPRESSION: 1. No acute intra-abdominal or intrapelvic abnormality. 2. Indeterminate interval development of a 1.1 x 0.7 cm peri splenic fluid density. Again attention on follow-up Electronically Signed   By: Morgane  Naveau M.D.   On: 01/06/2023 23:55     Disposition: Status is: Inpatient   Planned Discharge Destination: Home    Time spent: 35 minutes  Author: Jacqulyn Divine, MD 01/07/2023 1:20 PM  For on call review [email or web address].

## 2023-01-07 NOTE — Progress Notes (Signed)
   01/07/23 1238  TOC Brief Assessment  Insurance and Status Reviewed  Patient has primary care physician Yes  Home environment has been reviewed Single Family Home  Prior level of function: Independent  Prior/Current Home Services No current home services  Social Drivers of Health Review SDOH reviewed no interventions necessary  Readmission risk has been reviewed Yes  Transition of care needs no transition of care needs at this time    Transition of Care Department [facility]) has reviewed patient and no TOC needs have been identified at this time. We will continue to monitor patient advancement through interdisciplinary progression rounds. If new patient transition needs arise, please place a TOC consult.

## 2023-01-08 ENCOUNTER — Other Ambulatory Visit (HOSPITAL_BASED_OUTPATIENT_CLINIC_OR_DEPARTMENT_OTHER): Payer: Self-pay

## 2023-01-08 ENCOUNTER — Other Ambulatory Visit: Payer: Self-pay

## 2023-01-08 DIAGNOSIS — R509 Fever, unspecified: Secondary | ICD-10-CM | POA: Diagnosis not present

## 2023-01-08 LAB — COMPREHENSIVE METABOLIC PANEL
ALT: 52 U/L — ABNORMAL HIGH (ref 0–44)
AST: 40 U/L (ref 15–41)
Albumin: 2.9 g/dL — ABNORMAL LOW (ref 3.5–5.0)
Alkaline Phosphatase: 38 U/L (ref 38–126)
Anion gap: 9 (ref 5–15)
BUN: 6 mg/dL (ref 6–20)
CO2: 25 mmol/L (ref 22–32)
Calcium: 7.7 mg/dL — ABNORMAL LOW (ref 8.9–10.3)
Chloride: 104 mmol/L (ref 98–111)
Creatinine, Ser: 0.55 mg/dL (ref 0.44–1.00)
GFR, Estimated: >60 mL/min (ref >60)
Glucose, Bld: 100 mg/dL — ABNORMAL HIGH (ref 70–99)
Potassium: 3 mmol/L — ABNORMAL LOW (ref 3.5–5.1)
Sodium: 138 mmol/L (ref 135–145)
Total Bilirubin: 0.8 mg/dL (ref 0.0–1.2)
Total Protein: 5.8 g/dL — ABNORMAL LOW (ref 6.5–8.1)

## 2023-01-08 LAB — CBC
HCT: 44 % (ref 36.0–46.0)
Hemoglobin: 14.9 g/dL (ref 12.0–15.0)
MCH: 31 pg (ref 26.0–34.0)
MCHC: 33.9 g/dL (ref 30.0–36.0)
MCV: 91.5 fL (ref 80.0–100.0)
Platelets: 153 10*3/uL (ref 150–400)
RBC: 4.81 MIL/uL (ref 3.87–5.11)
RDW: 13.4 % (ref 11.5–15.5)
WBC: 6.8 10*3/uL (ref 4.0–10.5)
nRBC: 0 % (ref 0.0–0.2)

## 2023-01-08 LAB — URINE CULTURE

## 2023-01-08 LAB — GASTROINTESTINAL PANEL BY PCR, STOOL (REPLACES STOOL CULTURE)

## 2023-01-08 LAB — HEPATITIS PANEL, ACUTE
HCV Ab: NONREACTIVE
Hep A IgM: NONREACTIVE
Hep B C IgM: NONREACTIVE
Hepatitis B Surface Ag: NONREACTIVE

## 2023-01-08 LAB — GLUCOSE, CAPILLARY
Glucose-Capillary: 108 mg/dL — ABNORMAL HIGH (ref 70–99)
Glucose-Capillary: 129 mg/dL — ABNORMAL HIGH (ref 70–99)

## 2023-01-08 MED ORDER — POTASSIUM CHLORIDE CRYS ER 20 MEQ PO TBCR
40.0000 meq | EXTENDED_RELEASE_TABLET | ORAL | Status: DC
  Administered 2023-01-08 (×2): 40 meq via ORAL
  Filled 2023-01-08 (×2): qty 2

## 2023-01-08 MED ORDER — ONDANSETRON 4 MG PO TBDP
4.0000 mg | ORAL_TABLET | Freq: Three times a day (TID) | ORAL | 0 refills | Status: AC | PRN
  Filled 2023-01-08: qty 20, 7d supply, fill #0

## 2023-01-08 NOTE — Discharge Summary (Signed)
 Physician Discharge Summary  Cheryl Black FMW:[id number] DOB: February 12, 1985 DOA: 01/06/2023  PCP: Shona Norleen PEDLAR, MD  Admit date: 01/06/2023 Discharge date: 01/08/2023  Admitted From: Home Disposition: Home  Recommendations for Outpatient Follow-up:  Follow up with PCP in 1-2 week  Discharge Condition: Stable CODE STATUS: Full Diet recommendation: Low-salt low-fat bland diet  Brief/Interim Summary: Cheryl Black is a 38 y.o. female with medical history significant of hyperlipidemia, T2DM, GERD, asthma, obesity class III who presented to the emergency department due to 1 day onset of nausea, vomiting, diarrhea associated with fever.   Patient symptoms improved drastically with supportive care, she is otherwise stable and agreeable for discharge home, tolerating p.o. well without further symptoms.  Of note patient has indeterminant perisplenic fluid density, repeat imaging in the next few months, outpatient follow-up with PCP to schedule  Discharge Diagnoses:  Principal Problem:   Acute febrile illness Active Problems:   Essential hypertension   GERD (gastroesophageal reflux disease)   Mixed hyperlipidemia   Transaminitis   Nausea & vomiting   Diarrhea   SIRS (systemic inflammatory response syndrome) (HCC)   Type 2 diabetes mellitus with hyperglycemia (HCC)   Obesity, Class III, BMI 40-49.9 (morbid obesity) (HCC)  Sepsis secondary to gastroenteritis  Febrile with tachycardia and hypotension with notable source  Symptoms appear to be resolving quite rapidly, no further episodes of diarrhea, nausea and vomiting well-controlled with p.o. Zofran  -C. difficile panel negative -No further symptoms at this time and otherwise requesting discharge home -given she is able to tolerate p.o. without further issues certainly reasonable for discharge with close outpatient follow-up as discussed.   Hypokalemia -Secondary prior diarrhea, resolved     Type 2 diabetes mellitus with  hyperglycemia Hemoglobin A1c on 09/03/2022 was 7.6 Continue home medications - no changes   Elevated hemoglobin/hematocrit possibly due to hemoconcentration Resolved with IVF   Elevated liver panel Likely secondary to sepsis above, liver panel nonreactive   Hyperlipidemia Resume statin in 24 to 48 hours   GERD PPI as needed at home   Essential hypertension hold hydrochlorothiazide . Soft bp   Class III obesity (BMI of 41.05) Body mass index is 41.05 kg/m.   Transient episode of hypoxia Noted in the ED, x 1 vital signs reading without further symptomatology or recurrent episode of hypoxia  Indeterminate interval development of a 1.1 x 0.7 cm peri splenic fluid density.  Noted incidentally on CT scan -repeat imaging recommended, defer to PCP for timing  Discharge Instructions   Allergies as of 01/08/2023       Reactions   Ace Inhibitors Anaphylaxis   Beta Adrenergic Blockers Other (See Comments)   Angioedema   Clindamycin /lincomycin Hives   Augmentin  [amoxicillin -pot Clavulanate] Other (See Comments)   Has had C.diff in the past   Benicar [olmesartan] Swelling   Facial edema   Doxycycline Other (See Comments)   Makes feet and hands red   Egg-derived Products    Local reaction with injectable meds that contain eggs   Influenza Vaccine Live Swelling   Arm    Influenza Virus Vaccine Hives   Invokana [canagliflozin] Other (See Comments)   Dehydration   Liraglutide Nausea And Vomiting   Tape Dermatitis, Rash        Medication List     STOP taking these medications    hydrochlorothiazide  12.5 MG tablet Commonly known as: HYDRODIURIL        TAKE these medications    acetaminophen  500 MG tablet Commonly known as: TYLENOL  Take 1,000 mg  by mouth every 6 (six) hours as needed for moderate pain or headache.   Advair  HFA 115-21 MCG/ACT inhaler Generic drug: fluticasone -salmeterol Inhale 2 puffs into the lungs 2 (two) times daily.   allopurinol  300 MG  tablet Commonly known as: ZYLOPRIM  Take 1 tablet (300 mg total) by mouth daily.   Azelastine -Fluticasone  137-50 MCG/ACT Susp Place 1 spray into the nose in the morning and at bedtime.   BIOTIN PO Take 1 tablet by mouth daily.   Dexcom G7 Sensor Misc Change every 10 days as directed   Farxiga  5 MG Tabs tablet Generic drug: dapagliflozin  propanediol Take 1 tablet (5 mg total) by mouth daily.   freestyle lancets Use to test blood sugar 2-3 times daily   FREESTYLE LITE test strip Generic drug: glucose blood Use 2-3 times daily to test blood sugar   FreeStyle Lite w/Device Kit Use to test blood sugar   Gvoke HypoPen  2-Pack 1 MG/0.2ML Soaj Generic drug: Glucagon  use under the skin for hypoglycemia as needed   loratadine  10 MG tablet Commonly known as: CLARITIN  Take 1 tablet (10 mg total) by mouth daily.   metFORMIN  500 MG 24 hr tablet Commonly known as: GLUCOPHAGE -XR Take 2 tablets (1,000 mg total) by mouth daily.   metoCLOPramide  10 MG tablet Commonly known as: REGLAN  Take 1 tablet (10 mg total) by mouth every 8 (eight) hours as needed for nausea.   Mounjaro  5 MG/0.5ML Pen Generic drug: tirzepatide  Inject 5 mg into the skin once a week.   ondansetron  4 MG disintegrating tablet Commonly known as: ZOFRAN -ODT Take 1 tablet (4 mg total) by mouth every 8 (eight) hours as needed for nausea or vomiting.   pantoprazole  40 MG tablet Commonly known as: PROTONIX  Take 1 tablet (40 mg total) by mouth 2 (two) times daily.   potassium chloride  10 MEQ tablet Commonly known as: KLOR-CON  Take 1 tablet (10 mEq total) by mouth daily.   pravastatin  20 MG tablet Commonly known as: PRAVACHOL  Take 1 tablet (20 mg total) by mouth Nightly for cholesterol What changed:  when to take this additional instructions   Probiotic (Lactobacillus) Caps Take 1 capsule by mouth daily.   TechLite Pen Needles 32G X 4 MM Misc Generic drug: Insulin  Pen Needle Use as directed to inject insulin   4 times a day   VITAMIN D  PO Take 1 tablet by mouth daily.        Allergies  Allergen Reactions   Ace Inhibitors Anaphylaxis   Beta Adrenergic Blockers Other (See Comments)    Angioedema   Clindamycin /Lincomycin Hives   Augmentin  [Amoxicillin -Pot Clavulanate] Other (See Comments)    Has had C.diff in the past   Benicar [Olmesartan] Swelling    Facial edema   Doxycycline Other (See Comments)    Makes feet and hands red   Egg-Derived Products     Local reaction with injectable meds that contain eggs   Influenza Vaccine Live Swelling    Arm    Influenza Virus Vaccine Hives   Invokana [Canagliflozin] Other (See Comments)    Dehydration    Liraglutide Nausea And Vomiting   Tape Dermatitis and Rash    Consultations: None  Procedures/Studies: CT ABDOMEN PELVIS W CONTRAST Result Date: 01/06/2023 CLINICAL DATA:  Abdominal pain, acute, nonlocalized NVD starting around 7 am today. Pt states that she has tried Zofran  and Reglan  with no relief. Denies any sick contacts. EXAM: CT ABDOMEN AND PELVIS WITH CONTRAST TECHNIQUE: Multidetector CT imaging of the abdomen and pelvis was performed using the  standard protocol following bolus administration of intravenous contrast. RADIATION DOSE REDUCTION: This exam was performed according to the departmental dose-optimization program which includes automated exposure control, adjustment of the mA and/or kV according to patient size and/or use of iterative reconstruction technique. CONTRAST:  OMNIPAQUE  IOHEXOL  300 MG/ML  SOLN COMPARISON:  CT abdomen pelvis 01/31/2021 FINDINGS: Lower chest: Lingular and bilateral lower lobe atelectasis. No acute abnormality. Hepatobiliary: No focal liver abnormality. Status post cholecystectomy. No biliary dilatation. Pancreas: No focal lesion. Normal pancreatic contour. No surrounding inflammatory changes. No main pancreatic ductal dilatation. Spleen: Normal in size without focal abnormality. Adrenals/Urinary  Tract: No adrenal nodule bilaterally. Bilateral kidneys enhance symmetrically. No hydronephrosis. No hydroureter. The urinary bladder is unremarkable. Stomach/Bowel: Stomach is within normal limits. No evidence of bowel wall thickening or dilatation. Appendix appears normal. Vascular/Lymphatic: No abdominal aorta or iliac aneurysm. Stable prominent nonspecific aortocaval lymph node (2:40). No abdominal, pelvic, or inguinal lymphadenopathy. Reproductive: Status post hysterectomy. No adnexal masses. Bilateral ovaries are unremarkable. Other: Interval development of an indeterminate 1.1 x 0.7 cm perisplenic fluid density. No intraperitoneal free gas. No organized fluid collection. Musculoskeletal: No abdominal wall hernia or abnormality. No suspicious lytic or blastic osseous lesions. No acute displaced fracture. Multilevel degenerative changes of the spine. IMPRESSION: 1. No acute intra-abdominal or intrapelvic abnormality. 2. Indeterminate interval development of a 1.1 x 0.7 cm peri splenic fluid density. Again attention on follow-up Electronically Signed   By: Morgane  Naveau M.D.   On: 01/06/2023 23:55     Subjective: No acute issues or events overnight   Discharge Exam: Vitals:   01/07/23 1531 01/07/23 1900  BP: 94/68 103/71  Pulse:  (!) 112  Resp:  (!) 31  Temp: 97.7 F (36.5 C) 98 F (36.7 C)  SpO2:  99%   Vitals:   01/07/23 1122 01/07/23 1400 01/07/23 1531 01/07/23 1900  BP: 92/62  94/68 103/71  Pulse:    (!) 112  Resp:    (!) 31  Temp: 98.3 F (36.8 C)  97.7 F (36.5 C) 98 F (36.7 C)  TempSrc: Oral  Oral Oral  SpO2:  97%  99%  Weight:      Height:        General: Pt is alert, awake, not in acute distress Cardiovascular: RRR, S1/S2 +, no rubs, no gallops Respiratory: CTA bilaterally, no wheezing, no rhonchi Abdominal: Soft, NT, ND, bowel sounds + Extremities: no edema, no cyanosis    The results of significant diagnostics from this hospitalization (including imaging,  microbiology, ancillary and laboratory) are listed below for reference.     Microbiology: Recent Results (from the past 240 hours)  Resp panel by RT-PCR (RSV, Flu A&B, Covid) Anterior Nasal Swab     Status: None   Collection Time: 01/06/23  6:14 PM   Specimen: Anterior Nasal Swab  Result Value Ref Range Status   SARS Coronavirus 2 by RT PCR NEGATIVE NEGATIVE Final    Comment: (NOTE) SARS-CoV-2 target nucleic acids are NOT DETECTED.  The SARS-CoV-2 RNA is generally detectable in upper respiratory specimens during the acute phase of infection. The lowest concentration of SARS-CoV-2 viral copies this assay can detect is 138 copies/mL. A negative result does not preclude SARS-Cov-2 infection and should not be used as the sole basis for treatment or other patient management decisions. A negative result may occur with  improper specimen collection/handling, submission of specimen other than nasopharyngeal swab, presence of viral mutation(s) within the areas targeted by this assay, and inadequate number of  viral copies(<138 copies/mL). A negative result must be combined with clinical observations, patient history, and epidemiological information. The expected result is Negative.  Fact Sheet for Patients:  bloggercourse.com  Fact Sheet for Healthcare Providers:  seriousbroker.it  This test is no t yet approved or cleared by the [place]  FDA and  has been authorized for detection and/or diagnosis of SARS-CoV-2 by FDA under an Emergency Use Authorization (EUA). This EUA will remain  in effect (meaning this test can be used) for the duration of the COVID-19 declaration under Section 564(b)(1) of the Act, 21 U.S.C.section 360bbb-3(b)(1), unless the authorization is terminated  or revoked sooner.       Influenza A by PCR NEGATIVE NEGATIVE Final   Influenza B by PCR NEGATIVE NEGATIVE Final    Comment: (NOTE) The Xpert Xpress  SARS-CoV-2/FLU/RSV plus assay is intended as an aid in the diagnosis of influenza from Nasopharyngeal swab specimens and should not be used as a sole basis for treatment. Nasal washings and aspirates are unacceptable for Xpert Xpress SARS-CoV-2/FLU/RSV testing.  Fact Sheet for Patients: bloggercourse.com  Fact Sheet for Healthcare Providers: seriousbroker.it  This test is not yet approved or cleared by the [place]  FDA and has been authorized for detection and/or diagnosis of SARS-CoV-2 by FDA under an Emergency Use Authorization (EUA). This EUA will remain in effect (meaning this test can be used) for the duration of the COVID-19 declaration under Section 564(b)(1) of the Act, 21 U.S.C. section 360bbb-3(b)(1), unless the authorization is terminated or revoked.     Resp Syncytial Virus by PCR NEGATIVE NEGATIVE Final    Comment: (NOTE) Fact Sheet for Patients: bloggercourse.com  Fact Sheet for Healthcare Providers: seriousbroker.it  This test is not yet approved or cleared by the [place]  FDA and has been authorized for detection and/or diagnosis of SARS-CoV-2 by FDA under an Emergency Use Authorization (EUA). This EUA will remain in effect (meaning this test can be used) for the duration of the COVID-19 declaration under Section 564(b)(1) of the Act, 21 U.S.C. section 360bbb-3(b)(1), unless the authorization is terminated or revoked.  Performed at [facility], [address] [place], [place] [address]   C Difficile Quick Screen w PCR reflex     Status: None   Collection Time: 01/07/23  1:11 PM   Specimen: Stool  Result Value Ref Range Status   C Diff antigen NEGATIVE NEGATIVE Final   C Diff toxin NEGATIVE NEGATIVE Final   C Diff interpretation No C. difficile detected.  Final    Comment: Performed at [facility], [address], [place], [place] [address]      Labs: BNP (last 3 results) Recent Labs    03/25/22 2355  BNP 10.0   Basic Metabolic Panel: Recent Labs  Lab 01/06/23 1743 01/07/23 0910 01/08/23 0432  NA 134*  --  138  K 3.7  --  3.0*  CL 105  --  104  CO2 20*  --  25  GLUCOSE 183*  --  100*  BUN 15  --  6  CREATININE 0.79 0.62 0.55  CALCIUM  8.9  --  7.7*  MG  --  1.5*  --   PHOS  --  2.8  --    Liver Function Tests: Recent Labs  Lab 01/06/23 1743 01/08/23 0432  AST 53* 40  ALT 78* 52*  ALKPHOS 59 38  BILITOT 1.5* 0.8  PROT 7.7 5.8*  ALBUMIN 4.0 2.9*   Recent Labs  Lab 01/06/23 1743  LIPASE 26  No results for input(s): AMMONIA in the last 168 hours. CBC: Recent Labs  Lab 01/06/23 1743 01/07/23 0910 01/08/23 0432  WBC 10.8* 7.1 6.8  NEUTROABS 10.1*  --   --   HGB 19.0* 15.1* 14.9  HCT 56.4* 43.6 44.0  MCV 88.8 90.1 91.5  PLT 247 178 153   Cardiac Enzymes: No results for input(s): CKTOTAL, CKMB, CKMBINDEX, TROPONINI in the last 168 hours. BNP: Invalid input(s): POCBNP CBG: Recent Labs  Lab 01/07/23 1121 01/07/23 1644 01/07/23 1954 01/08/23 0739 01/08/23 1109  GLUCAP 134* 106* 118* 108* 129*   D-Dimer No results for input(s): DDIMER in the last 72 hours. Hgb A1c No results for input(s): HGBA1C in the last 72 hours. Lipid Profile No results for input(s): CHOL, HDL, LDLCALC, TRIG, CHOLHDL, LDLDIRECT in the last 72 hours. Thyroid  function studies No results for input(s): TSH, T4TOTAL, T3FREE, THYROIDAB in the last 72 hours.  Invalid input(s): FREET3 Anemia work up No results for input(s): VITAMINB12, FOLATE, FERRITIN, TIBC, IRON, RETICCTPCT in the last 72 hours. Urinalysis    Component Value Date/Time   COLORURINE AMBER (A) 01/06/2023 1852   APPEARANCEUR CLOUDY (A) 01/06/2023 1852   LABSPEC 1.020 01/06/2023 1852   PHURINE 5.0 01/06/2023 1852   GLUCOSEU >=500 (A) 01/06/2023 1852   HGBUR SMALL (A) 01/06/2023 1852   BILIRUBINUR  NEGATIVE 01/06/2023 1852   BILIRUBINUR negative 04/19/2021 0835   KETONESUR NEGATIVE 01/06/2023 1852   PROTEINUR 30 (A) 01/06/2023 1852   UROBILINOGEN 0.2 04/19/2021 0835   UROBILINOGEN 0.2 08/17/2012 0339   NITRITE NEGATIVE 01/06/2023 1852   LEUKOCYTESUR MODERATE (A) 01/06/2023 1852   Sepsis Labs Recent Labs  Lab 01/06/23 1743 01/07/23 0910 01/08/23 0432  WBC 10.8* 7.1 6.8   Microbiology Recent Results (from the past 240 hours)  Resp panel by RT-PCR (RSV, Flu A&B, Covid) Anterior Nasal Swab     Status: None   Collection Time: 01/06/23  6:14 PM   Specimen: Anterior Nasal Swab  Result Value Ref Range Status   SARS Coronavirus 2 by RT PCR NEGATIVE NEGATIVE Final    Comment: (NOTE) SARS-CoV-2 target nucleic acids are NOT DETECTED.  The SARS-CoV-2 RNA is generally detectable in upper respiratory specimens during the acute phase of infection. The lowest concentration of SARS-CoV-2 viral copies this assay can detect is 138 copies/mL. A negative result does not preclude SARS-Cov-2 infection and should not be used as the sole basis for treatment or other patient management decisions. A negative result may occur with  improper specimen collection/handling, submission of specimen other than nasopharyngeal swab, presence of viral mutation(s) within the areas targeted by this assay, and inadequate number of viral copies(<138 copies/mL). A negative result must be combined with clinical observations, patient history, and epidemiological information. The expected result is Negative.  Fact Sheet for Patients:  bloggercourse.com  Fact Sheet for Healthcare Providers:  seriousbroker.it  This test is no t yet approved or cleared by the [place]  FDA and  has been authorized for detection and/or diagnosis of SARS-CoV-2 by FDA under an Emergency Use Authorization (EUA). This EUA will remain  in effect (meaning this test can be used) for  the duration of the COVID-19 declaration under Section 564(b)(1) of the Act, 21 U.S.C.section 360bbb-3(b)(1), unless the authorization is terminated  or revoked sooner.       Influenza A by PCR NEGATIVE NEGATIVE Final   Influenza B by PCR NEGATIVE NEGATIVE Final    Comment: (NOTE) The Xpert Xpress SARS-CoV-2/FLU/RSV plus assay is intended as an  aid in the diagnosis of influenza from Nasopharyngeal swab specimens and should not be used as a sole basis for treatment. Nasal washings and aspirates are unacceptable for Xpert Xpress SARS-CoV-2/FLU/RSV testing.  Fact Sheet for Patients: bloggercourse.com  Fact Sheet for Healthcare Providers: seriousbroker.it  This test is not yet approved or cleared by the [place]  FDA and has been authorized for detection and/or diagnosis of SARS-CoV-2 by FDA under an Emergency Use Authorization (EUA). This EUA will remain in effect (meaning this test can be used) for the duration of the COVID-19 declaration under Section 564(b)(1) of the Act, 21 U.S.C. section 360bbb-3(b)(1), unless the authorization is terminated or revoked.     Resp Syncytial Virus by PCR NEGATIVE NEGATIVE Final    Comment: (NOTE) Fact Sheet for Patients: bloggercourse.com  Fact Sheet for Healthcare Providers: seriousbroker.it  This test is not yet approved or cleared by the [place]  FDA and has been authorized for detection and/or diagnosis of SARS-CoV-2 by FDA under an Emergency Use Authorization (EUA). This EUA will remain in effect (meaning this test can be used) for the duration of the COVID-19 declaration under Section 564(b)(1) of the Act, 21 U.S.C. section 360bbb-3(b)(1), unless the authorization is terminated or revoked.  Performed at [facility], [address] [place], [place] [address]   C Difficile Quick Screen w PCR reflex     Status: None    Collection Time: 01/07/23  1:11 PM   Specimen: Stool  Result Value Ref Range Status   C Diff antigen NEGATIVE NEGATIVE Final   C Diff toxin NEGATIVE NEGATIVE Final   C Diff interpretation No C. difficile detected.  Final    Comment: Performed at [facility], [address] [place], [place] [address]     Time coordinating discharge: Over 30 minutes  SIGNED:   Elsie JAYSON [place], DO [facility] 01/08/2023, 11:10 AM Pager   If 7PM-7AM, please contact night-coverage [email or web address]

## 2023-01-08 NOTE — Progress Notes (Signed)
Discharge instructions reviewed with patient. Patient verbalized understanding of instructions. Patient discharged home with family in stable condition   

## 2023-01-08 NOTE — Plan of Care (Signed)

## 2023-01-15 DIAGNOSIS — E785 Hyperlipidemia, unspecified: Secondary | ICD-10-CM | POA: Diagnosis not present

## 2023-01-15 DIAGNOSIS — E1165 Type 2 diabetes mellitus with hyperglycemia: Secondary | ICD-10-CM | POA: Diagnosis not present

## 2023-01-15 DIAGNOSIS — A084 Viral intestinal infection, unspecified: Secondary | ICD-10-CM | POA: Diagnosis not present

## 2023-01-15 DIAGNOSIS — E876 Hypokalemia: Secondary | ICD-10-CM | POA: Diagnosis not present

## 2023-01-15 DIAGNOSIS — I10 Essential (primary) hypertension: Secondary | ICD-10-CM | POA: Diagnosis not present

## 2023-01-16 ENCOUNTER — Other Ambulatory Visit (HOSPITAL_COMMUNITY): Payer: Self-pay | Admitting: Family Medicine

## 2023-01-16 DIAGNOSIS — R9389 Abnormal findings on diagnostic imaging of other specified body structures: Secondary | ICD-10-CM

## 2023-01-19 ENCOUNTER — Other Ambulatory Visit (HOSPITAL_COMMUNITY): Payer: Self-pay

## 2023-01-20 ENCOUNTER — Other Ambulatory Visit (HOSPITAL_COMMUNITY): Payer: Self-pay

## 2023-01-20 MED ORDER — METFORMIN HCL ER 500 MG PO TB24
1000.0000 mg | ORAL_TABLET | Freq: Every day | ORAL | 1 refills | Status: DC
  Filled 2023-01-20: qty 180, 90d supply, fill #0
  Filled 2023-04-18 – 2023-04-27 (×2): qty 180, 90d supply, fill #1

## 2023-01-21 ENCOUNTER — Other Ambulatory Visit: Payer: Self-pay

## 2023-01-26 ENCOUNTER — Other Ambulatory Visit (HOSPITAL_COMMUNITY): Payer: Self-pay

## 2023-01-26 ENCOUNTER — Other Ambulatory Visit: Payer: Self-pay

## 2023-01-26 ENCOUNTER — Other Ambulatory Visit: Payer: Self-pay | Admitting: Internal Medicine

## 2023-01-26 MED ORDER — AZELASTINE-FLUTICASONE 137-50 MCG/ACT NA SUSP
1.0000 | Freq: Two times a day (BID) | NASAL | 5 refills | Status: AC
  Filled 2023-01-26: qty 23, 30d supply, fill #0
  Filled 2023-02-24 – 2023-03-02 (×3): qty 23, 30d supply, fill #1
  Filled 2023-03-26 – 2023-03-27 (×2): qty 23, 30d supply, fill #2
  Filled 2023-04-25 – 2023-04-27 (×2): qty 23, 30d supply, fill #3
  Filled 2023-09-14 – 2023-10-10 (×2): qty 23, 30d supply, fill #4

## 2023-01-27 ENCOUNTER — Other Ambulatory Visit (HOSPITAL_COMMUNITY): Payer: Self-pay

## 2023-01-28 ENCOUNTER — Other Ambulatory Visit: Payer: Self-pay

## 2023-01-28 ENCOUNTER — Other Ambulatory Visit (HOSPITAL_COMMUNITY): Payer: Self-pay

## 2023-01-29 ENCOUNTER — Ambulatory Visit (HOSPITAL_COMMUNITY)
Admission: RE | Admit: 2023-01-29 | Discharge: 2023-01-29 | Disposition: A | Discharge status: Home / Self Care | Payer: Commercial Managed Care - PPO | Source: Ambulatory Visit | Attending: Family Medicine | Admitting: Family Medicine

## 2023-01-29 DIAGNOSIS — R7401 Elevation of levels of liver transaminase levels: Secondary | ICD-10-CM | POA: Diagnosis not present

## 2023-01-29 DIAGNOSIS — R9389 Abnormal findings on diagnostic imaging of other specified body structures: Secondary | ICD-10-CM | POA: Insufficient documentation

## 2023-01-29 DIAGNOSIS — K7581 Nonalcoholic steatohepatitis (NASH): Secondary | ICD-10-CM | POA: Diagnosis not present

## 2023-01-29 DIAGNOSIS — R7402 Elevation of levels of lactic acid dehydrogenase (LDH): Secondary | ICD-10-CM | POA: Diagnosis not present

## 2023-01-29 DIAGNOSIS — Z0189 Encounter for other specified special examinations: Secondary | ICD-10-CM | POA: Diagnosis not present

## 2023-01-29 DIAGNOSIS — R7989 Other specified abnormal findings of blood chemistry: Secondary | ICD-10-CM | POA: Diagnosis not present

## 2023-02-03 ENCOUNTER — Other Ambulatory Visit (HOSPITAL_COMMUNITY): Payer: Self-pay

## 2023-02-03 ENCOUNTER — Other Ambulatory Visit: Payer: Self-pay

## 2023-02-03 MED ORDER — JANUVIA 50 MG PO TABS
50.0000 mg | ORAL_TABLET | Freq: Every day | ORAL | 5 refills | Status: DC
  Filled 2023-02-03: qty 30, 30d supply, fill #0
  Filled 2023-02-26 – 2023-03-02 (×3): qty 30, 30d supply, fill #1
  Filled 2023-03-28: qty 30, 30d supply, fill #2
  Filled 2023-04-27: qty 30, 30d supply, fill #3
  Filled 2023-05-25 (×2): qty 30, 30d supply, fill #4
  Filled 2023-06-22: qty 30, 30d supply, fill #5

## 2023-02-03 MED ORDER — DAPAGLIFLOZIN PROPANEDIOL 10 MG PO TABS
10.0000 mg | ORAL_TABLET | Freq: Every day | ORAL | 5 refills | Status: AC
  Filled 2023-02-03: qty 30, 30d supply, fill #0
  Filled 2023-02-26 – 2023-03-11 (×4): qty 30, 30d supply, fill #1
  Filled 2023-05-25: qty 30, 30d supply, fill #2
  Filled 2023-09-14: qty 30, 30d supply, fill #3
  Filled 2023-10-10: qty 30, 30d supply, fill #4
  Filled 2024-01-11: qty 30, 30d supply, fill #5
  Filled 2024-01-11: qty 30, 30d supply, fill #0

## 2023-02-12 LAB — HEPATIC FUNCTION PANEL
ALT: 101 [IU]/L — ABNORMAL HIGH (ref 0–32)
AST: 73 [IU]/L — ABNORMAL HIGH (ref 0–40)
Albumin: 4.1 g/dL (ref 3.9–4.9)
Alkaline Phosphatase: 97 [IU]/L (ref 44–121)
Bilirubin Total: <0.2 mg/dL (ref 0.0–1.2)
Bilirubin, Direct: 0.11 mg/dL (ref 0.00–0.40)
Total Protein: 6.5 g/dL (ref 6.0–8.5)

## 2023-02-12 LAB — ALPHA-1-ANTITRYPSIN PHENOTYP: A-1 Antitrypsin: 146 mg/dL (ref 100–188)

## 2023-02-12 LAB — HEPATITIS B CORE ANTIBODY, TOTAL: Hep B Core Total Ab: NEGATIVE

## 2023-02-18 ENCOUNTER — Other Ambulatory Visit (HOSPITAL_COMMUNITY): Payer: Self-pay

## 2023-02-26 ENCOUNTER — Other Ambulatory Visit (HOSPITAL_COMMUNITY): Payer: Self-pay

## 2023-02-27 ENCOUNTER — Other Ambulatory Visit: Payer: Self-pay

## 2023-03-02 ENCOUNTER — Other Ambulatory Visit: Payer: Self-pay

## 2023-03-02 ENCOUNTER — Other Ambulatory Visit (HOSPITAL_COMMUNITY): Payer: Self-pay

## 2023-03-03 ENCOUNTER — Other Ambulatory Visit: Payer: Self-pay

## 2023-03-11 ENCOUNTER — Other Ambulatory Visit: Payer: Self-pay

## 2023-03-11 ENCOUNTER — Other Ambulatory Visit (HOSPITAL_COMMUNITY): Payer: Self-pay

## 2023-03-12 ENCOUNTER — Other Ambulatory Visit: Payer: Self-pay

## 2023-03-12 ENCOUNTER — Encounter: Payer: Self-pay | Admitting: Pharmacist

## 2023-03-16 DIAGNOSIS — E1165 Type 2 diabetes mellitus with hyperglycemia: Secondary | ICD-10-CM | POA: Diagnosis not present

## 2023-03-19 ENCOUNTER — Encounter (HOSPITAL_COMMUNITY): Payer: Self-pay

## 2023-03-19 ENCOUNTER — Ambulatory Visit (HOSPITAL_COMMUNITY)
Admission: EM | Admit: 2023-03-19 | Discharge: 2023-03-19 | Disposition: A | Discharge status: Home / Self Care | Attending: Family Medicine | Admitting: Family Medicine

## 2023-03-19 DIAGNOSIS — N3090 Cystitis, unspecified without hematuria: Secondary | ICD-10-CM | POA: Insufficient documentation

## 2023-03-19 LAB — POCT URINALYSIS DIP (MANUAL ENTRY)
Bilirubin, UA: NEGATIVE
Blood, UA: NEGATIVE
Glucose, UA: >=1000 mg/dL — AB
Ketones, POC UA: NEGATIVE mg/dL
Nitrite, UA: NEGATIVE
Protein Ur, POC: NEGATIVE mg/dL
Spec Grav, UA: 1.01 (ref 1.010–1.025)
Urobilinogen, UA: 0.2 U/dL
pH, UA: 5.5 (ref 5.0–8.0)

## 2023-03-19 MED ORDER — SULFAMETHOXAZOLE-TRIMETHOPRIM 800-160 MG PO TABS: 1.0000 | ORAL_TABLET | Freq: Two times a day (BID) | ORAL | 0 refills | Status: AC

## 2023-03-19 NOTE — Discharge Instructions (Signed)
 You were seen today for painful urination.  Your urine does show possible infection.  I have sent out bactrim twice/day x 7 days to treat this.   I have sent the urine for culture.  If a change in antibiotic is needed you will be notified.

## 2023-03-19 NOTE — ED Triage Notes (Signed)
 Patient here today with c/o burning in urination the past 2 morning. Patient states that last night she was fine. Denies abd pain.

## 2023-03-19 NOTE — ED Provider Notes (Signed)
 [facility]    CSN: [id number] Arrival date & time: 03/19/23  4098      History   Chief Complaint Chief Complaint  Patient presents with   Dysuria    HPI Cheryl Black is a 38 y.o. female.    Dysuria  Patient is here for dysuria x 2 days.  Mostly just in the mornings.  No urinary frequency that is different than usual.  No n/v.  No fevers/chills.  No belly pain or back pain.  No vaginal symptoms.        Past Medical History:  Diagnosis Date   Acid reflux    Asthma    Clostridium difficile infection    in the past   Diabetes mellitus    Fatty liver    Fibrocystic breast disease    Gout    Hidradenitis    History of kidney stones    Hyperlipemia    Obesity    Polycystic ovary syndrome    PONV (postoperative nausea and vomiting)    Respiratory failure (HCC)    "2018 and 2019"   Sleep apnea    Smoker     Patient Active Problem List   Diagnosis Date Noted   Acute febrile illness 01/07/2023   Diarrhea 01/07/2023   SIRS (systemic inflammatory response syndrome) (HCC) 01/07/2023   Type 2 diabetes mellitus with hyperglycemia (HCC) 01/07/2023   Obesity, Class III, BMI 40-49.9 (morbid obesity) (HCC) 01/07/2023   Dysmenorrhea 09/08/2022   Sepsis due to cellulitis (HCC) 07/09/2022   Menometrorrhagia 07/09/2022   AKI (acute kidney injury) (HCC) 07/09/2022   Dehydration 07/09/2022   Asthma, chronic 07/09/2022   Essential hypertension 07/09/2022   Asthma exacerbation 03/26/2022   Prolonged QT interval 03/26/2022   Mixed hyperlipidemia 03/26/2022   Hydrosalpinx 01/31/2021   Amenorrhea, secondary 01/31/2021   PCOS (polycystic ovarian syndrome) 10/04/2020   Encounter for gynecological examination with Papanicolaou smear of cervix 10/04/2020   DUB (dysfunctional uterine bleeding) 10/04/2020   GERD (gastroesophageal reflux disease) 12/10/2018   Acute respiratory failure with hypoxia (HCC) 03/18/2018   Type 2 diabetes mellitus with hyperlipidemia  (HCC) 03/18/2018   Respiratory distress    Thickened endometrium 01/28/2018   Endometrial mass 01/28/2018   Abdominal pain 10/09/2017   Nausea & vomiting 09/24/2017   DOE (dyspnea on exertion) 09/16/2017   SOB (shortness of breath) 08/25/2017   Bronchospasm, acute 08/25/2017   Acute respiratory failure with hypoxemia (HCC) 08/25/2017   C. difficile enteritis 02/01/2012   Hyponatremia 01/07/2011   Hypokalemia 01/07/2011   Elevated LFTs 01/07/2011   Hypomagnesemia 01/07/2011   Type 2 diabetes mellitus with obesity (HCC) 01/06/2011   Class 3 obesity 01/06/2011   Polycystic ovary disease 01/06/2011   Tobacco abuse 01/06/2011   Steatohepatitis 06/08/2008   RUQ pain 06/08/2008   Transaminitis 06/08/2008    Past Surgical History:  Procedure Laterality Date   CHOLECYSTECTOMY  01/10/2011   Procedure: LAPAROSCOPIC CHOLECYSTECTOMY;  Surgeon: Dalia Heading;  Location: [facility];  Service: General;  Laterality: N/A;   ESOPHAGOGASTRODUODENOSCOPY (EGD) WITH PROPOFOL N/A 09/09/2018   RMR: Mild erosive reflux esophagitis.  Esophagus dilated for history of dysphagia.   HYDRADENITIS EXCISION Right 06/18/2012   Procedure: EXCISION HYDRIADENITIS SUPRATIVA  RIGHT AXILLA;  Surgeon: Marlane Hatcher, MD;  Location: [facility];  Service: General;  Laterality: Right;   LIVER BIOPSY  01/10/2011   Procedure: LIVER BIOPSY;  Surgeon: Dalia Heading;  Location: [facility];  Service: General;;   MALONEY DILATION N/A 09/09/2018  Procedure: MALONEY DILATION;  Surgeon: Corbin Ade, MD;  [facility]: [facility];  Service: Endoscopy;  Laterality: N/A;   ROBOTIC ASSISTED TOTAL HYSTERECTOMY WITH BILATERAL SALPINGO OOPHERECTOMY Bilateral 09/05/2022   Procedure: XI ROBOTIC ASSISTED TOTAL HYSTERECTOMY WITH BILATERAL SALPINGO;  Surgeon: Lazaro Arms, MD;  Location: [facility];  Service: Gynecology;  Laterality: Bilateral;   SKIN SPLIT GRAFT Right 06/18/2012   Procedure: SKIN GRAFT SPLIT THICKNESS RIGHT AXILLA(WITH TWO STANDARD SKIN  BOARDS 3"x 8" )  DONOR SITE RIGHT AND LEFT THIGHS;  Surgeon: Marlane Hatcher, MD;  Location: [facility];  Service: General;  Laterality: Right;    OB History     Gravida  0   Para  0   Term  0   Preterm  0   AB  0   Living  0      SAB  0   IAB  0   Ectopic  0   Multiple  0   Live Births  0            Home Medications    Prior to Admission medications   Medication Sig Start Date End Date Taking? Authorizing Provider  acetaminophen (TYLENOL) 500 MG tablet Take 1,000 mg by mouth every 6 (six) hours as needed for moderate pain or headache.    [provider]  allopurinol (ZYLOPRIM) 300 MG tablet Take 1 tablet (300 mg total) by mouth daily. 06/30/22     Azelastine-Fluticasone 137-50 MCG/ACT SUSP Place 1 spray into the nose in the morning and at bedtime. 01/26/23   Charlott Holler, MD  BIOTIN PO Take 1 tablet by mouth daily.    [provider]  Blood Glucose Monitoring Suppl (FREESTYLE LITE) w/Device KIT Use to test blood sugar 01/10/22     Continuous Glucose Sensor (DEXCOM G7 SENSOR) MISC Change every 10 days as directed 12/29/22     dapagliflozin propanediol (FARXIGA) 10 MG TABS tablet Take 1 tablet (10 mg total) by mouth daily. 02/02/23     fluticasone-salmeterol (ADVAIR HFA) 115-21 MCG/ACT inhaler Inhale 2 puffs into the lungs 2 (two) times daily. 12/10/22   Charlott Holler, MD  Glucagon (GVOKE HYPOPEN 2-PACK) 1 MG/0.2ML SOAJ use under the skin for hypoglycemia as needed 01/31/22     glucose blood test strip Use 2-3 times daily to test blood sugar 01/10/22     Insulin Pen Needle 32G X 4 MM MISC Use as directed to inject insulin 4 times a day 01/10/22     Lancets (ONETOUCH DELICA PLUS LANCET33G) MISC Use to test blood sugar 2-3 times daily 01/10/22     loratadine (CLARITIN) 10 MG tablet Take 1 tablet (10 mg total) by mouth daily. 04/15/22   Charlott Holler, MD  metFORMIN (GLUCOPHAGE-XR) 500 MG 24 hr tablet Take 2 tablets (1,000 mg total) by mouth daily. 01/19/23      metoCLOPramide (REGLAN) 10 MG tablet Take 1 tablet (10 mg total) by mouth every 8 (eight) hours as needed for nausea. 07/11/22   Arrien, York Ram, MD  ondansetron (ZOFRAN-ODT) 4 MG disintegrating tablet Take 1 tablet (4 mg total) by mouth every 8 (eight) hours as needed for nausea or vomiting. 01/08/23   Azucena Fallen, MD  pantoprazole (PROTONIX) 40 MG tablet Take 1 tablet (40 mg total) by mouth 2 (two) times daily. 07/04/22     Probiotic, Lactobacillus, CAPS Take 1 capsule by mouth daily. 11/28/19   [provider]  sitaGLIPtin (JANUVIA) 50 MG tablet Take 1  tablet (50 mg total) by mouth daily. 02/02/23     VITAMIN D PO Take 1 tablet by mouth daily.    [provider]    Family History Family History  Problem Relation Age of Onset   Other Mother        pre-cancerous cells; had hyst   Emphysema Mother        smoker   COPD Mother    Diabetes Father    Hypertension Father    Cancer Sister        cervical; had hyst   Gout Brother    Hypertension Maternal Grandmother    Heart attack Maternal Grandfather    Cancer Maternal Grandfather        adenocarcinoma   Emphysema Maternal Grandfather        smoked   Hypertension Paternal Grandmother    Diabetes Paternal Grandmother    Gout Paternal Grandmother    Asthma Maternal Uncle    Stroke Other        paternal great grandfather   Colon cancer Neg Hx    Liver disease Neg Hx    Liver cancer Neg Hx     Social History Social History   Tobacco Use   Smoking status: Former    Current packs/day: 0.00    Average packs/day: 1 pack/day for 10.0 years (10.0 ttl pk-yrs)    Types: Cigarettes    Start date: 01/07/2008    Quit date: 01/06/2018    Years since quitting: 5.2    Passive exposure: Current   Smokeless tobacco: Never  Vaping Use   Vaping status: Never Used  Substance Use Topics   Alcohol use: Not Currently    Comment: occas   Drug use: No     Allergies   Ace inhibitors, Beta adrenergic blockers,  Clindamycin/lincomycin, Augmentin [amoxicillin-pot clavulanate], Benicar [olmesartan], Doxycycline, Egg-derived products, Influenza vaccine live, Influenza virus vaccine, Invokana [canagliflozin], Liraglutide, and Tape   Review of Systems Review of Systems  Constitutional: Negative.   HENT: Negative.    Respiratory: Negative.    Cardiovascular: Negative.   Gastrointestinal: Negative.   Genitourinary:  Positive for dysuria.  Musculoskeletal: Negative.   Psychiatric/Behavioral: Negative.       Physical Exam Triage Vital Signs ED Triage Vitals  Encounter Vitals Group     BP 03/19/23 0928 118/86     Systolic BP Percentile --      Diastolic BP Percentile --      Pulse Rate 03/19/23 0928 69     Resp 03/19/23 0928 16     Temp 03/19/23 0928 98 F (36.7 C)     Temp Source 03/19/23 0928 Oral     SpO2 03/19/23 0928 96 %     Weight 03/19/23 0924 270 lb (122.5 kg)     Height 03/19/23 0924 5\' 9"  (1.753 m)     Head Circumference --      Peak Flow --      Pain Score 03/19/23 0931 4     Pain Loc --      Pain Education --      Exclude from Growth Chart --    No data found.  Updated Vital Signs BP 118/86 (BP Location: Right Arm)   Pulse 69   Temp 98 F (36.7 C) (Oral)   Resp 16   Ht 5\' 9"  (1.753 m)   Wt 122.5 kg   LMP 12/12/2021 (Exact Date)   SpO2 96%   BMI 39.87 kg/m   Visual Acuity Right  Eye Distance:   Left Eye Distance:   Bilateral Distance:    Right Eye Near:   Left Eye Near:    Bilateral Near:     Physical Exam Constitutional:      General: She is not in acute distress.    Appearance: Normal appearance. She is not ill-appearing or toxic-appearing.  Cardiovascular:     Rate and Rhythm: Normal rate and regular rhythm.  Pulmonary:     Effort: Pulmonary effort is normal.     Breath sounds: Normal breath sounds.  Abdominal:     Palpations: Abdomen is soft.  Skin:    General: Skin is warm.  Neurological:     General: No focal deficit present.     Mental  Status: She is alert.  Psychiatric:        Mood and Affect: Mood normal.      UC Treatments / Results  Labs (all labs ordered are listed, but only abnormal results are displayed) Labs Reviewed  POCT URINALYSIS DIP (MANUAL ENTRY) - Abnormal; Notable for the following components:      Result Value   Glucose, UA >=1,000 (*)    Leukocytes, UA Trace (*)    All other components within normal limits  URINE CULTURE    EKG   Radiology No results found.  Procedures Procedures (including critical care time)  Medications Ordered in UC Medications - No data to display  Initial Impression / Assessment and Plan / UC Course  I have reviewed the triage vital signs and the nursing notes.  Pertinent labs & imaging results that were available during my care of the patient were reviewed by me and considered in my medical decision making (see chart for details).  Final Clinical Impressions(s) / UC Diagnoses   Final diagnoses:  Cystitis     Discharge Instructions      You were seen today for painful urination.  Your urine does show possible infection.  I have sent out bactrim twice/day x 7 days to treat this.   I have sent the urine for culture.  If a change in antibiotic is needed you will be notified.     ED Prescriptions     Medication Sig Dispense Auth. Provider   sulfamethoxazole-trimethoprim (BACTRIM DS) 800-160 MG tablet Take 1 tablet by mouth 2 (two) times daily for 7 days. 14 tablet Jannifer Franklin, MD      PDMP not reviewed this encounter.   Jannifer Franklin, MD 03/19/23 [phone]

## 2023-03-20 ENCOUNTER — Telehealth (HOSPITAL_COMMUNITY): Payer: Self-pay

## 2023-03-20 LAB — URINE CULTURE

## 2023-03-20 NOTE — Telephone Encounter (Signed)
 TC to pt regarding urine culture results. Advised pt she could return for urine recollection. Advised if she does not have improvement in the next 24-48 hrs of abx, it would recommended to return to re-eval/recollection. Verbalized understanding.

## 2023-03-26 ENCOUNTER — Other Ambulatory Visit (HOSPITAL_COMMUNITY): Payer: Self-pay

## 2023-03-26 ENCOUNTER — Other Ambulatory Visit: Payer: Self-pay

## 2023-03-27 ENCOUNTER — Encounter: Payer: Self-pay | Admitting: Pharmacist

## 2023-03-27 ENCOUNTER — Other Ambulatory Visit: Payer: Self-pay

## 2023-03-27 ENCOUNTER — Other Ambulatory Visit (HOSPITAL_COMMUNITY): Payer: Self-pay

## 2023-03-27 ENCOUNTER — Other Ambulatory Visit: Payer: Self-pay | Admitting: *Deleted

## 2023-03-27 DIAGNOSIS — R7989 Other specified abnormal findings of blood chemistry: Secondary | ICD-10-CM

## 2023-03-27 DIAGNOSIS — R7401 Elevation of levels of liver transaminase levels: Secondary | ICD-10-CM

## 2023-03-27 MED ORDER — ALLOPURINOL 300 MG PO TABS
300.0000 mg | ORAL_TABLET | Freq: Every day | ORAL | 2 refills | Status: DC
  Filled 2023-03-27: qty 90, 90d supply, fill #0
  Filled 2023-06-24 (×2): qty 90, 90d supply, fill #1
  Filled 2023-09-22: qty 90, 90d supply, fill #2

## 2023-03-28 ENCOUNTER — Other Ambulatory Visit (HOSPITAL_COMMUNITY): Payer: Self-pay

## 2023-04-02 ENCOUNTER — Other Ambulatory Visit: Payer: Self-pay

## 2023-04-02 ENCOUNTER — Other Ambulatory Visit (HOSPITAL_COMMUNITY): Payer: Self-pay

## 2023-04-02 DIAGNOSIS — K7581 Nonalcoholic steatohepatitis (NASH): Secondary | ICD-10-CM | POA: Diagnosis not present

## 2023-04-02 DIAGNOSIS — E785 Hyperlipidemia, unspecified: Secondary | ICD-10-CM | POA: Diagnosis not present

## 2023-04-02 DIAGNOSIS — I10 Essential (primary) hypertension: Secondary | ICD-10-CM | POA: Diagnosis not present

## 2023-04-02 DIAGNOSIS — N390 Urinary tract infection, site not specified: Secondary | ICD-10-CM | POA: Diagnosis not present

## 2023-04-02 DIAGNOSIS — E1165 Type 2 diabetes mellitus with hyperglycemia: Secondary | ICD-10-CM | POA: Diagnosis not present

## 2023-04-02 MED ORDER — JANUVIA 100 MG PO TABS
100.0000 mg | ORAL_TABLET | Freq: Every day | ORAL | 5 refills | Status: DC
Start: 2023-04-02 — End: 2023-09-14
  Filled 2023-04-02 – 2023-04-04 (×3): qty 30, 30d supply, fill #0
  Filled 2023-04-29 – 2023-05-04 (×2): qty 30, 30d supply, fill #1
  Filled 2023-05-29 – 2023-06-09 (×2): qty 30, 30d supply, fill #2
  Filled 2023-07-02 – 2023-07-03 (×2): qty 30, 30d supply, fill #3
  Filled 2023-08-01 – 2023-08-20 (×4): qty 30, 30d supply, fill #4
  Filled 2023-09-12 – 2023-09-14 (×2): qty 30, 30d supply, fill #5

## 2023-04-03 ENCOUNTER — Other Ambulatory Visit: Payer: Self-pay

## 2023-04-03 DIAGNOSIS — G4733 Obstructive sleep apnea (adult) (pediatric): Secondary | ICD-10-CM | POA: Diagnosis not present

## 2023-04-04 ENCOUNTER — Other Ambulatory Visit (HOSPITAL_BASED_OUTPATIENT_CLINIC_OR_DEPARTMENT_OTHER): Payer: Self-pay

## 2023-04-04 ENCOUNTER — Other Ambulatory Visit (HOSPITAL_COMMUNITY): Payer: Self-pay

## 2023-04-08 ENCOUNTER — Other Ambulatory Visit (HOSPITAL_COMMUNITY): Payer: Self-pay

## 2023-04-27 ENCOUNTER — Other Ambulatory Visit (HOSPITAL_COMMUNITY): Payer: Self-pay

## 2023-04-27 ENCOUNTER — Other Ambulatory Visit: Payer: Self-pay

## 2023-04-29 ENCOUNTER — Ambulatory Visit
Admission: RE | Admit: 2023-04-29 | Discharge: 2023-04-29 | Disposition: A | Discharge status: Home / Self Care | Source: Ambulatory Visit | Attending: Family Medicine | Admitting: Family Medicine

## 2023-04-29 ENCOUNTER — Other Ambulatory Visit (HOSPITAL_COMMUNITY): Payer: Self-pay

## 2023-04-29 VITALS — BP 125/76 | HR 62 | Temp 97.7°F | Resp 18

## 2023-04-29 DIAGNOSIS — E119 Type 2 diabetes mellitus without complications: Secondary | ICD-10-CM

## 2023-04-29 DIAGNOSIS — R310 Gross hematuria: Secondary | ICD-10-CM

## 2023-04-29 DIAGNOSIS — R300 Dysuria: Secondary | ICD-10-CM | POA: Diagnosis not present

## 2023-04-29 DIAGNOSIS — Z794 Long term (current) use of insulin: Secondary | ICD-10-CM

## 2023-04-29 DIAGNOSIS — R102 Pelvic and perineal pain: Secondary | ICD-10-CM | POA: Diagnosis not present

## 2023-04-29 LAB — POCT URINALYSIS DIP (MANUAL ENTRY)
Bilirubin, UA: NEGATIVE
Blood, UA: NEGATIVE
Glucose, UA: >=1000 mg/dL — AB
Ketones, POC UA: NEGATIVE mg/dL
Leukocytes, UA: NEGATIVE
Nitrite, UA: NEGATIVE
Protein Ur, POC: NEGATIVE mg/dL
Spec Grav, UA: 1.01 (ref 1.010–1.025)
Urobilinogen, UA: 0.2 U/dL
pH, UA: 5.5 (ref 5.0–8.0)

## 2023-04-29 NOTE — Discharge Instructions (Signed)
 Your urine test today does not show evidence of a urinary tract infection.  Drink plenty of fluids, watch your blood sugars closely and follow-up with your primary care provider if your symptoms do not resolve.

## 2023-04-29 NOTE — ED Triage Notes (Signed)
 Urinary pressure and blood in urine that started yesterday.  Chills started last night and Urinary frequency.

## 2023-04-29 NOTE — ED Provider Notes (Addendum)
 [facility]    CSN: [id number] Arrival date & time: 04/29/23  1024      History   Chief Complaint Chief Complaint  Patient presents with   Urinary Frequency    blood in urine and nausea and lower back pain with bladder pressure at times - Entered by patient    HPI Cheryl Black is a 38 y.o. female.   Patient presenting today with 1 day history of noticing blood in urine, urinary pressure, suprapubic pressure, urinary frequency and dysuria.  Denies fever, flank pain, vomiting, bowel changes, recent medication or dietary changes.  Does have a history of diabetes on oral medication including Farxiga .  States this does not feel like a typical urinary tract infection to her.    Past Medical History:  Diagnosis Date   Acid reflux    Asthma    Clostridium difficile infection    in the past   Diabetes mellitus    Fatty liver    Fibrocystic breast disease    Gout    Hidradenitis    History of kidney stones    Hyperlipemia    Obesity    Polycystic ovary syndrome    PONV (postoperative nausea and vomiting)    Respiratory failure (HCC)    "2018 and 2019"   Sleep apnea    Smoker     Patient Active Problem List   Diagnosis Date Noted   Acute febrile illness 01/07/2023   Diarrhea 01/07/2023   SIRS (systemic inflammatory response syndrome) (HCC) 01/07/2023   Type 2 diabetes mellitus with hyperglycemia (HCC) 01/07/2023   Obesity, Class III, BMI 40-49.9 (morbid obesity) (HCC) 01/07/2023   Dysmenorrhea 09/08/2022   Sepsis due to cellulitis (HCC) 07/09/2022   Menometrorrhagia 07/09/2022   AKI (acute kidney injury) (HCC) 07/09/2022   Dehydration 07/09/2022   Asthma, chronic 07/09/2022   Essential hypertension 07/09/2022   Asthma exacerbation 03/26/2022   Prolonged QT interval 03/26/2022   Mixed hyperlipidemia 03/26/2022   Hydrosalpinx 01/31/2021   Amenorrhea, secondary 01/31/2021   PCOS (polycystic ovarian syndrome) 10/04/2020   Encounter for gynecological  examination with Papanicolaou smear of cervix 10/04/2020   DUB (dysfunctional uterine bleeding) 10/04/2020   GERD (gastroesophageal reflux disease) 12/10/2018   Acute respiratory failure with hypoxia (HCC) 03/18/2018   Type 2 diabetes mellitus with hyperlipidemia (HCC) 03/18/2018   Respiratory distress    Thickened endometrium 01/28/2018   Endometrial mass 01/28/2018   Abdominal pain 10/09/2017   Nausea & vomiting 09/24/2017   DOE (dyspnea on exertion) 09/16/2017   SOB (shortness of breath) 08/25/2017   Bronchospasm, acute 08/25/2017   Acute respiratory failure with hypoxemia (HCC) 08/25/2017   C. difficile enteritis 02/01/2012   Hyponatremia 01/07/2011   Hypokalemia 01/07/2011   Elevated LFTs 01/07/2011   Hypomagnesemia 01/07/2011   Type 2 diabetes mellitus with obesity (HCC) 01/06/2011   Class 3 obesity 01/06/2011   Polycystic ovary disease 01/06/2011   Tobacco abuse 01/06/2011   Steatohepatitis 06/08/2008   RUQ pain 06/08/2008   Transaminitis 06/08/2008    Past Surgical History:  Procedure Laterality Date   CHOLECYSTECTOMY  01/10/2011   Procedure: LAPAROSCOPIC CHOLECYSTECTOMY;  Surgeon: Beau Bound;  Location: [facility];  Service: General;  Laterality: N/A;   ESOPHAGOGASTRODUODENOSCOPY (EGD) WITH PROPOFOL  N/A 09/09/2018   RMR: Mild erosive reflux esophagitis.  Esophagus dilated for history of dysphagia.   HYDRADENITIS EXCISION Right 06/18/2012   Procedure: EXCISION HYDRIADENITIS SUPRATIVA  RIGHT AXILLA;  Surgeon: Myrl Askew, MD;  Location: [facility];  Service:  General;  Laterality: Right;   LIVER BIOPSY  01/10/2011   Procedure: LIVER BIOPSY;  Surgeon: Beau Bound;  Location: [facility];  Service: General;;   MALONEY DILATION N/A 09/09/2018   Procedure: Londa Rival DILATION;  Surgeon: Suzette Espy, MD;  [facility];  Service: Endoscopy;  Laterality: N/A;   ROBOTIC ASSISTED TOTAL HYSTERECTOMY WITH BILATERAL SALPINGO OOPHERECTOMY Bilateral 09/05/2022   Procedure: XI  ROBOTIC ASSISTED TOTAL HYSTERECTOMY WITH BILATERAL SALPINGO;  Surgeon: Wendelyn Halter, MD;  Location: [facility];  Service: Gynecology;  Laterality: Bilateral;   SKIN SPLIT GRAFT Right 06/18/2012   Procedure: SKIN GRAFT SPLIT THICKNESS RIGHT AXILLA(WITH TWO STANDARD SKIN BOARDS 3"x 8" )  DONOR SITE RIGHT AND LEFT THIGHS;  Surgeon: Myrl Askew, MD;  Location: [facility];  Service: General;  Laterality: Right;    OB History     Gravida  0   Para  0   Term  0   Preterm  0   AB  0   Living  0      SAB  0   IAB  0   Ectopic  0   Multiple  0   Live Births  0            Home Medications    Prior to Admission medications   Medication Sig Start Date End Date Taking? Authorizing Provider  acetaminophen  (TYLENOL ) 500 MG tablet Take 1,000 mg by mouth every 6 (six) hours as needed for moderate pain or headache.    [provider]  allopurinol  (ZYLOPRIM ) 300 MG tablet Take 1 tablet (300 mg total) by mouth daily. 03/27/23     Azelastine -Fluticasone  137-50 MCG/ACT SUSP Place 1 spray into the nose in the morning and at bedtime. 01/26/23   Desai, Nikita S, MD  BIOTIN PO Take 1 tablet by mouth daily.    [provider]  Blood Glucose Monitoring Suppl (FREESTYLE LITE) w/Device KIT Use to test blood sugar 01/10/22     Continuous Glucose Sensor (DEXCOM G7 SENSOR) MISC Change every 10 days as directed 12/29/22     dapagliflozin  propanediol (FARXIGA ) 10 MG TABS tablet Take 1 tablet (10 mg total) by mouth daily. 02/02/23     fluticasone -salmeterol (ADVAIR  HFA) 115-21 MCG/ACT inhaler Inhale 2 puffs into the lungs 2 (two) times daily. 12/10/22   Aleck Hurdle, MD  Glucagon  (GVOKE HYPOPEN  2-PACK) 1 MG/0.2ML SOAJ use under the skin for hypoglycemia as needed 01/31/22     glucose blood test strip Use 2-3 times daily to test blood sugar 01/10/22     Insulin  Pen Needle 32G X 4 MM MISC Use as directed to inject insulin  4 times a day 01/10/22     Lancets (ONETOUCH DELICA PLUS LANCET33G) MISC  Use to test blood sugar 2-3 times daily 01/10/22     loratadine  (CLARITIN ) 10 MG tablet Take 1 tablet (10 mg total) by mouth daily. 04/15/22   Aleck Hurdle, MD  metFORMIN  (GLUCOPHAGE -XR) 500 MG 24 hr tablet Take 2 tablets (1,000 mg total) by mouth daily. 01/19/23     metoCLOPramide  (REGLAN ) 10 MG tablet Take 1 tablet (10 mg total) by mouth every 8 (eight) hours as needed for nausea. 07/11/22   Arrien, Curlee Doss, MD  ondansetron  (ZOFRAN -ODT) 4 MG disintegrating tablet Take 1 tablet (4 mg total) by mouth every 8 (eight) hours as needed for nausea or vomiting. 01/08/23   Haydee Lipa, MD  pantoprazole  (PROTONIX ) 40 MG tablet Take 1 tablet (40 mg  total) by mouth 2 (two) times daily. 07/04/22     Probiotic, Lactobacillus, CAPS Take 1 capsule by mouth daily. 11/28/19   [provider]  sitaGLIPtin  (JANUVIA ) 100 MG tablet Take 1 tablet (100 mg total) by mouth daily. 04/02/23     sitaGLIPtin  (JANUVIA ) 50 MG tablet Take 1 tablet (50 mg total) by mouth daily. 02/02/23     VITAMIN D  PO Take 1 tablet by mouth daily.    [provider]    Family History Family History  Problem Relation Age of Onset   Other Mother        pre-cancerous cells; had hyst   Emphysema Mother        smoker   COPD Mother    Diabetes Father    Hypertension Father    Cancer Sister        cervical; had hyst   Gout Brother    Hypertension Maternal Grandmother    Heart attack Maternal Grandfather    Cancer Maternal Grandfather        adenocarcinoma   Emphysema Maternal Grandfather        smoked   Hypertension Paternal Grandmother    Diabetes Paternal Grandmother    Gout Paternal Grandmother    Asthma Maternal Uncle    Stroke Other        paternal great grandfather   Colon cancer Neg Hx    Liver disease Neg Hx    Liver cancer Neg Hx     Social History Social History   Tobacco Use   Smoking status: Former    Current packs/day: 0.00    Average packs/day: 1 pack/day for 10.0 years (10.0 ttl  pk-yrs)    Types: Cigarettes    Start date: 01/07/2008    Quit date: 01/06/2018    Years since quitting: 5.3    Passive exposure: Current   Smokeless tobacco: Never  Vaping Use   Vaping status: Never Used  Substance Use Topics   Alcohol use: Not Currently    Comment: occas   Drug use: No     Allergies   Ace inhibitors, Beta adrenergic blockers, Clindamycin /lincomycin, Augmentin  [amoxicillin -pot clavulanate], Benicar [olmesartan], Doxycycline, Egg-derived products, Influenza vaccine live, Influenza virus vaccine, Invokana [canagliflozin], Liraglutide, and Tape   Review of Systems Review of Systems PER HPI  Physical Exam Triage Vital Signs ED Triage Vitals  Encounter Vitals Group     BP 04/29/23 1112 125/76     Systolic BP Percentile --      Diastolic BP Percentile --      Pulse Rate 04/29/23 1112 62     Resp 04/29/23 1112 18     Temp 04/29/23 1112 97.7 F (36.5 C)     Temp Source 04/29/23 1112 Oral     SpO2 04/29/23 1112 98 %     Weight --      Height --      Head Circumference --      Peak Flow --      Pain Score 04/29/23 1114 0     Pain Loc --      Pain Education --      Exclude from Growth Chart --    No data found.  Updated Vital Signs BP 125/76 (BP Location: Right Arm)   Pulse 62   Temp 97.7 F (36.5 C) (Oral)   Resp 18   LMP 12/12/2021 (Exact Date)   SpO2 98%   Visual Acuity Right Eye Distance:   Left Eye Distance:  Bilateral Distance:    Right Eye Near:   Left Eye Near:    Bilateral Near:     Physical Exam Vitals and nursing note reviewed.  Constitutional:      Appearance: Normal appearance. She is not ill-appearing.  HENT:     Head: Atraumatic.     Mouth/Throat:     Mouth: Mucous membranes are moist.  Eyes:     Extraocular Movements: Extraocular movements intact.     Conjunctiva/sclera: Conjunctivae normal.  Cardiovascular:     Rate and Rhythm: Normal rate.  Pulmonary:     Effort: Pulmonary effort is normal.  Abdominal:      General: Bowel sounds are normal. There is no distension.     Palpations: Abdomen is soft.     Tenderness: There is no abdominal tenderness. There is no right CVA tenderness, left CVA tenderness or guarding.  Musculoskeletal:        General: Normal range of motion.     Cervical back: Normal range of motion and neck supple.  Skin:    General: Skin is warm and dry.  Neurological:     Mental Status: She is alert and oriented to person, place, and time.  Psychiatric:        Mood and Affect: Mood normal.        Thought Content: Thought content normal.        Judgment: Judgment normal.      UC Treatments / Results  Labs (all labs ordered are listed, but only abnormal results are displayed) Labs Reviewed  POCT URINALYSIS DIP (MANUAL ENTRY) - Abnormal; Notable for the following components:      Result Value   Glucose, UA >=1,000 (*)    All other components within normal limits    EKG   Radiology No results found.  Procedures Procedures (including critical care time)  Medications Ordered in UC Medications - No data to display  Initial Impression / Assessment and Plan / UC Course  I have reviewed the triage vital signs and the nursing notes.  Pertinent labs & imaging results that were available during my care of the patient were reviewed by me and considered in my medical decision making (see chart for details).     Reassuring today, urinalysis only positive for glucose which is to be expected given the Farxiga .  Suspect symptoms more related to irritation/inflammation, possibly side effect of the Farxiga  but possibly dehydration or other etiology.  No evidence of a urinary tract infection today or kidney stone.  Discussed increase fluid intake, watch sugars closely and follow-up with primary care for recheck.  Return sooner for worsening symptoms.  Final Clinical Impressions(s) / UC Diagnoses   Final diagnoses:  Gross hematuria  Suprapubic pressure  Dysuria  Type 2  diabetes mellitus without complication, without long-term current use of insulin  [facility])     Discharge Instructions      Your urine test today does not show evidence of a urinary tract infection.  Drink plenty of fluids, watch your blood sugars closely and follow-up with your primary care provider if your symptoms do not resolve.    ED Prescriptions   None    PDMP not reviewed this encounter.   Corbin Dess, [place] 04/29/23 1152    Tacy Expose [place], [place] 04/29/23 1152

## 2023-05-04 ENCOUNTER — Other Ambulatory Visit (HOSPITAL_COMMUNITY): Payer: Self-pay

## 2023-05-25 ENCOUNTER — Other Ambulatory Visit: Payer: Self-pay

## 2023-05-25 ENCOUNTER — Other Ambulatory Visit (HOSPITAL_COMMUNITY): Payer: Self-pay

## 2023-05-26 ENCOUNTER — Other Ambulatory Visit: Payer: Self-pay

## 2023-05-29 ENCOUNTER — Other Ambulatory Visit: Payer: Self-pay

## 2023-06-08 ENCOUNTER — Other Ambulatory Visit (HOSPITAL_COMMUNITY): Payer: Self-pay

## 2023-06-09 ENCOUNTER — Other Ambulatory Visit (HOSPITAL_COMMUNITY): Payer: Self-pay

## 2023-06-22 ENCOUNTER — Other Ambulatory Visit: Payer: Self-pay

## 2023-06-22 ENCOUNTER — Other Ambulatory Visit (HOSPITAL_COMMUNITY): Payer: Self-pay

## 2023-06-24 ENCOUNTER — Other Ambulatory Visit: Payer: Self-pay | Admitting: Internal Medicine

## 2023-06-24 ENCOUNTER — Other Ambulatory Visit (HOSPITAL_COMMUNITY): Payer: Self-pay

## 2023-06-24 ENCOUNTER — Other Ambulatory Visit: Payer: Self-pay

## 2023-06-24 MED ORDER — LORATADINE 10 MG PO TABS
10.0000 mg | ORAL_TABLET | Freq: Every day | ORAL | 1 refills | Status: DC
  Filled 2023-06-24 (×2): qty 90, 90d supply, fill #0
  Filled 2023-09-22: qty 90, 90d supply, fill #1

## 2023-06-24 MED ORDER — PANTOPRAZOLE SODIUM 40 MG PO TBEC
40.0000 mg | DELAYED_RELEASE_TABLET | Freq: Two times a day (BID) | ORAL | 2 refills | Status: AC
  Filled 2023-06-24: qty 180, 90d supply, fill #0
  Filled 2023-09-22: qty 180, 90d supply, fill #1
  Filled 2023-12-15 – 2024-01-11 (×4): qty 180, 90d supply, fill #2

## 2023-07-02 ENCOUNTER — Other Ambulatory Visit (HOSPITAL_COMMUNITY): Payer: Self-pay

## 2023-07-03 ENCOUNTER — Other Ambulatory Visit (HOSPITAL_COMMUNITY): Payer: Self-pay

## 2023-07-03 ENCOUNTER — Other Ambulatory Visit: Payer: Self-pay

## 2023-07-15 ENCOUNTER — Other Ambulatory Visit (HOSPITAL_COMMUNITY): Payer: Self-pay | Admitting: Nurse Practitioner

## 2023-07-15 DIAGNOSIS — R7989 Other specified abnormal findings of blood chemistry: Secondary | ICD-10-CM | POA: Diagnosis not present

## 2023-07-15 DIAGNOSIS — K760 Fatty (change of) liver, not elsewhere classified: Secondary | ICD-10-CM | POA: Diagnosis not present

## 2023-07-16 DIAGNOSIS — R7989 Other specified abnormal findings of blood chemistry: Secondary | ICD-10-CM | POA: Diagnosis not present

## 2023-07-20 ENCOUNTER — Encounter (HOSPITAL_COMMUNITY): Payer: Self-pay | Admitting: Nurse Practitioner

## 2023-07-20 ENCOUNTER — Other Ambulatory Visit: Payer: Self-pay

## 2023-07-20 ENCOUNTER — Other Ambulatory Visit (HOSPITAL_COMMUNITY): Payer: Self-pay

## 2023-07-21 ENCOUNTER — Other Ambulatory Visit (HOSPITAL_COMMUNITY): Payer: Self-pay

## 2023-07-21 MED ORDER — JANUVIA 100 MG PO TABS
100.0000 mg | ORAL_TABLET | Freq: Every day | ORAL | 5 refills | Status: DC
  Filled 2023-07-21 – 2023-07-27 (×4): qty 30, 30d supply, fill #0
  Filled 2023-08-20 – 2023-09-14 (×2): qty 30, 30d supply, fill #1

## 2023-07-21 MED ORDER — METFORMIN HCL ER 500 MG PO TB24
1000.0000 mg | ORAL_TABLET | Freq: Every day | ORAL | 1 refills | Status: DC
  Filled 2023-07-21: qty 180, 90d supply, fill #0
  Filled 2023-10-17 – 2023-10-20 (×3): qty 180, 90d supply, fill #1

## 2023-07-22 ENCOUNTER — Other Ambulatory Visit: Payer: Self-pay

## 2023-07-22 ENCOUNTER — Other Ambulatory Visit (HOSPITAL_COMMUNITY): Payer: Self-pay

## 2023-07-23 ENCOUNTER — Other Ambulatory Visit (HOSPITAL_COMMUNITY): Payer: Self-pay

## 2023-07-23 ENCOUNTER — Ambulatory Visit (HOSPITAL_COMMUNITY)
Admission: RE | Admit: 2023-07-23 | Discharge: 2023-07-23 | Disposition: A | Discharge status: Home / Self Care | Source: Ambulatory Visit | Attending: Nurse Practitioner | Admitting: Nurse Practitioner

## 2023-07-23 DIAGNOSIS — R7989 Other specified abnormal findings of blood chemistry: Secondary | ICD-10-CM | POA: Diagnosis present

## 2023-07-23 DIAGNOSIS — R161 Splenomegaly, not elsewhere classified: Secondary | ICD-10-CM | POA: Diagnosis not present

## 2023-07-23 DIAGNOSIS — K760 Fatty (change of) liver, not elsewhere classified: Secondary | ICD-10-CM | POA: Diagnosis not present

## 2023-07-27 ENCOUNTER — Other Ambulatory Visit: Payer: Self-pay

## 2023-07-27 ENCOUNTER — Other Ambulatory Visit (HOSPITAL_COMMUNITY): Payer: Self-pay

## 2023-07-28 ENCOUNTER — Other Ambulatory Visit (HOSPITAL_COMMUNITY): Payer: Self-pay | Admitting: Nurse Practitioner

## 2023-07-28 ENCOUNTER — Telehealth (HOSPITAL_COMMUNITY): Payer: Self-pay

## 2023-07-28 DIAGNOSIS — R7989 Other specified abnormal findings of blood chemistry: Secondary | ICD-10-CM

## 2023-07-28 NOTE — Telephone Encounter (Signed)
-----   Message from [name] sent at 07/28/2023  3:54 PM EDT ----- Regarding: RE: transjugular biopsy PROCEDURE / BIOPSY REVIEW Date: 07/28/23  Requested Biopsy site: transjugular liver biopsy Reason for request: LFT abnormal, MASLD Imaging review: Best seen on CT  Decision: Approved Imaging modality to perform: Ultrasound and Fluoroscopy Schedule with: Moderate Sedation Schedule for: Any VIR  Additional comments:  @[email or web address] .   Please contact me with questions, concerns, or if issue pertaining to this request arise.  [name], MD [facility] ----- Message ----- From: [name] Sent: 07/27/2023   3:56 PM EDT To: Ir Procedure Requests Subject: transjugular biopsy                            Procedure: Transjugular biopsy  Dx: Elevated LFT's   Ordering: [name], NP [phone]  Imaging: Ultrasound Abd in epic done 7/17  Please review.   Thanks,  Cheryl Black

## 2023-07-29 ENCOUNTER — Other Ambulatory Visit (HOSPITAL_COMMUNITY): Payer: Self-pay | Admitting: Student

## 2023-07-29 DIAGNOSIS — K7581 Nonalcoholic steatohepatitis (NASH): Secondary | ICD-10-CM

## 2023-07-30 DIAGNOSIS — E1165 Type 2 diabetes mellitus with hyperglycemia: Secondary | ICD-10-CM | POA: Diagnosis not present

## 2023-07-30 DIAGNOSIS — I10 Essential (primary) hypertension: Secondary | ICD-10-CM | POA: Diagnosis not present

## 2023-07-30 NOTE — H&P (Signed)
 Chief Complaint: Elevated LFTs  Referring Provider(s): Dawn Drazek  Supervising Physician: Jenna Hacker  Patient Status: [facility] - Out-pt  History of Present Illness: Cheryl Black is a 38 y.o. female with medical issues including diabetes, HLD, PCOS and elevated liver enzymes.  Her LFTs continue to worsen.  She is here today for random liver biopsy.  She is NPO. No nausea/vomiting. No Fever/chills. ROS negative.   Patient is Full Code  Past Medical History:  Diagnosis Date   Acid reflux    Asthma    Clostridium difficile infection    in the past   Diabetes mellitus    Fatty liver    Fibrocystic breast disease    Gout    Hidradenitis    History of kidney stones    Hyperlipemia    Obesity    Polycystic ovary syndrome    PONV (postoperative nausea and vomiting)    Respiratory failure (HCC)    2018 and 2019   Sleep apnea    Smoker     Past Surgical History:  Procedure Laterality Date   CHOLECYSTECTOMY  01/10/2011   Procedure: LAPAROSCOPIC CHOLECYSTECTOMY;  Surgeon: Oneil DELENA Budge;  Location: [facility] ORS;  Service: General;  Laterality: N/A;   ESOPHAGOGASTRODUODENOSCOPY (EGD) WITH PROPOFOL  N/A 09/09/2018   RMR: Mild erosive reflux esophagitis.  Esophagus dilated for history of dysphagia.   HYDRADENITIS EXCISION Right 06/18/2012   Procedure: EXCISION HYDRIADENITIS SUPRATIVA  RIGHT AXILLA;  Surgeon: Elsie GORMAN Holland, MD;  Location: [facility] ORS;  Service: General;  Laterality: Right;   LIVER BIOPSY  01/10/2011   Procedure: LIVER BIOPSY;  Surgeon: Oneil DELENA Budge;  Location: [facility];  Service: General;;   MALONEY DILATION N/A 09/09/2018   Procedure: AGAPITO DILATION;  Surgeon: Shaaron Lamar HERO, MD;  Location: [facility] ENDO SUITE;  Service: Endoscopy;  Laterality: N/A;   ROBOTIC ASSISTED TOTAL HYSTERECTOMY WITH BILATERAL SALPINGO OOPHERECTOMY Bilateral 09/05/2022   Procedure: XI ROBOTIC ASSISTED TOTAL HYSTERECTOMY WITH BILATERAL SALPINGO;  Surgeon: Jayne Vonn DEL, MD;  Location: [facility]  ORS;  Service: Gynecology;  Laterality: Bilateral;   SKIN SPLIT GRAFT Right 06/18/2012   Procedure: SKIN GRAFT SPLIT THICKNESS RIGHT AXILLA(WITH TWO STANDARD SKIN BOARDS 3x 8 )  DONOR SITE RIGHT AND LEFT THIGHS;  Surgeon: Elsie GORMAN Holland, MD;  Location: [facility];  Service: General;  Laterality: Right;    Allergies: Ace inhibitors, Beta adrenergic blockers, Clindamycin /lincomycin, Augmentin  [amoxicillin -pot clavulanate], Benicar [olmesartan], Doxycycline, Egg-derived products, Influenza vaccine live, Influenza virus vaccine, Invokana [canagliflozin], Liraglutide, and Tape  Medications: Prior to Admission medications   Medication Sig Start Date End Date Taking? Authorizing Provider  acetaminophen  (TYLENOL ) 500 MG tablet Take 1,000 mg by mouth every 6 (six) hours as needed for moderate pain or headache.    [provider]  allopurinol  (ZYLOPRIM ) 300 MG tablet Take 1 tablet (300 mg total) by mouth daily. 03/27/23     Azelastine -Fluticasone  137-50 MCG/ACT SUSP Place 1 spray into the nose in the morning and at bedtime. 01/26/23   Desai, Nikita S, MD  BIOTIN PO Take 1 tablet by mouth daily.    [provider]  Blood Glucose Monitoring Suppl (FREESTYLE LITE) w/Device KIT Use to test blood sugar 01/10/22     Continuous Glucose Sensor (DEXCOM G7 SENSOR) MISC Change every 10 days as directed 12/29/22     dapagliflozin  propanediol (FARXIGA ) 10 MG TABS tablet Take 1 tablet (10 mg total) by mouth daily. 02/02/23     fluticasone -salmeterol (ADVAIR  HFA) 115-21 MCG/ACT inhaler Inhale  2 puffs into the lungs 2 (two) times daily. 12/10/22   Desai, Nikita S, MD  Glucagon  (GVOKE HYPOPEN  2-PACK) 1 MG/0.2ML SOAJ use under the skin for hypoglycemia as needed 01/31/22     glucose blood test strip Use 2-3 times daily to test blood sugar 01/10/22     Insulin  Pen Needle 32G X 4 MM MISC Use as directed to inject insulin  4 times a day 01/10/22     Lancets (ONETOUCH DELICA PLUS LANCET33G) MISC Use to test blood sugar  2-3 times daily 01/10/22     loratadine  (CLARITIN ) 10 MG tablet Take 1 tablet (10 mg total) by mouth daily. 06/24/23   Meade Verdon RAMAN, MD  metFORMIN  (GLUCOPHAGE -XR) 500 MG 24 hr tablet Take 2 tablets (1,000 mg total) by mouth daily. 07/21/23     metoCLOPramide  (REGLAN ) 10 MG tablet Take 1 tablet (10 mg total) by mouth every 8 (eight) hours as needed for nausea. 07/11/22   Arrien, Elidia Sieving, MD  ondansetron  (ZOFRAN -ODT) 4 MG disintegrating tablet Take 1 tablet (4 mg total) by mouth every 8 (eight) hours as needed for nausea or vomiting. 01/08/23   Lue Elsie BROCKS, MD  pantoprazole  (PROTONIX ) 40 MG tablet Take 1 tablet (40 mg total) by mouth 2 (two) times daily. 06/24/23     Probiotic, Lactobacillus, CAPS Take 1 capsule by mouth daily. 11/28/19   [provider]  sitaGLIPtin  (JANUVIA ) 100 MG tablet Take 1 tablet (100 mg total) by mouth daily. 04/02/23     sitaGLIPtin  (JANUVIA ) 100 MG tablet Take 1 tablet (100 mg total) by mouth daily. 07/21/23     VITAMIN D  PO Take 1 tablet by mouth daily.    [provider]     Family History  Problem Relation Age of Onset   Other Mother        pre-cancerous cells; had hyst   Emphysema Mother        smoker   COPD Mother    Diabetes Father    Hypertension Father    Cancer Sister        cervical; had hyst   Gout Brother    Hypertension Maternal Grandmother    Heart attack Maternal Grandfather    Cancer Maternal Grandfather        adenocarcinoma   Emphysema Maternal Grandfather        smoked   Hypertension Paternal Grandmother    Diabetes Paternal Grandmother    Gout Paternal Grandmother    Asthma Maternal Uncle    Stroke Other        paternal great grandfather   Colon cancer Neg Hx    Liver disease Neg Hx    Liver cancer Neg Hx     Social History   Socioeconomic History   Marital status: Married    Spouse name: Lynwood   Number of children: 0   Years of education: Not on file   Highest education level: Not on file   Occupational History   Occupation: nurse  Tobacco Use   Smoking status: Former    Current packs/day: 0.00    Average packs/day: 1 pack/day for 10.0 years (10.0 ttl pk-yrs)    Types: Cigarettes    Start date: 01/07/2008    Quit date: 01/06/2018    Years since quitting: 5.5    Passive exposure: Current   Smokeless tobacco: Never  Vaping Use   Vaping status: Never Used  Substance and Sexual Activity   Alcohol use: Not Currently    Comment: occas  Drug use: No   Sexual activity: Yes    Birth control/protection: None  Other Topics Concern   Not on file  Social History Narrative   Is a nurse here at [facility]   Social Drivers of Health   Financial Resource Strain: Low Risk  (10/04/2020)   Overall Financial Resource Strain (CARDIA)    Difficulty of Paying Living Expenses: Not hard at all  Food Insecurity: Low Risk  (07/15/2023)   Received from [facility]   Hunger Vital Sign    Within the past 12 months, you worried that your food would run out before you got money to buy more: Never true    Within the past 12 months, the food you bought just didn't last and you didn't have money to get more. : Never true  Transportation Needs: No Transportation Needs (07/15/2023)   Received from [employer]    In the past 12 months, has lack of reliable transportation kept you from medical appointments, meetings, work or from getting things needed for daily living? : No  Physical Activity: Sufficiently Active (10/04/2020)   Exercise Vital Sign    Days of Exercise per Week: 4 days    Minutes of Exercise per Session: 60 min  Stress: No Stress Concern Present (10/04/2020)   [employer] of Occupational Health - Occupational Stress Questionnaire    Feeling of Stress : Only a little  Social Connections: Socially Isolated (10/04/2020)   Social Connection and Isolation Panel    Frequency of Communication with Friends and Family: Once a week    Frequency of Social Gatherings with  Friends and Family: Once a week    Attends Religious Services: Never    Database administrator or Organizations: No    Attends Engineer, structural: Never    Marital Status: Married     Review of Systems: A 12 point ROS discussed and pertinent positives are indicated in the HPI above.  All other systems are negative.    Vital Signs: LMP 12/12/2021 (Exact Date)   Advance Care Plan: The advanced care place/surrogate decision maker was discussed at the time of visit and the patient did not wish to discuss or was not able to name a surrogate decision maker or provide an advance care plan.  Physical Exam Vitals reviewed.  Constitutional:      Appearance: Normal appearance.  HENT:     Head: Normocephalic and atraumatic.  Eyes:     Extraocular Movements: Extraocular movements intact.  Cardiovascular:     Rate and Rhythm: Normal rate and regular rhythm.  Pulmonary:     Effort: Pulmonary effort is normal. No respiratory distress.     Breath sounds: Normal breath sounds.  Abdominal:     Palpations: Abdomen is soft.  Musculoskeletal:        General: Normal range of motion.     Cervical back: Normal range of motion.  Skin:    General: Skin is warm and dry.  Neurological:     General: No focal deficit present.     Mental Status: She is alert and oriented to person, place, and time.  Psychiatric:        Mood and Affect: Mood normal.        Behavior: Behavior normal.        Thought Content: Thought content normal.        Judgment: Judgment normal.     Imaging: US  ABDOMEN COMPLETE W/ELASTOGRAPHY Result Date: 07/24/2023 CLINICAL DATA:  Elevated liver function tests EXAM: ULTRASOUND ABDOMEN ULTRASOUND HEPATIC ELASTOGRAPHY TECHNIQUE: Sonography of the upper abdomen was performed. In addition, ultrasound elastography evaluation of the liver was performed. A region of interest was placed within the right lobe of the liver. Following application of a compressive sonographic  pulse, tissue compressibility was assessed. Multiple assessments were performed at the selected site. Median tissue compressibility was determined. Previously, hepatic stiffness was assessed by shear wave velocity. Based on recently published Society of Radiologists in Ultrasound consensus article, reporting is now recommended to be performed in the SI units of pressure (kiloPascals) representing hepatic stiffness/elasticity. The obtained result is compared to the published reference standards. (cACLD = compensated Advanced Chronic Liver Disease) COMPARISON:  Ultrasound 01/29/2023 limited for fluid.  CT 01/06/2023 FINDINGS: ULTRASOUND ABDOMEN Gallbladder: Previous cholecystectomy. Common bile duct: Diameter: 3 mm Liver: Diffusely echogenic hepatic parenchyma consistent with fatty liver infiltration. With this level of echogenicity evaluation for underlying mass lesion is limited and if needed follow-up contrast CT or MRI as clinically appropriate. Portal vein is patent on color Doppler imaging with normal direction of blood flow towards the liver. IVC: Poorly seen. Pancreas: Poorly seen. Spleen: Enlarged with a length of 14.3 cm.  Total volume 502 cc Right Kidney: Length: 13.1 cm. Echogenicity within normal limits. No mass or hydronephrosis visualized. Left Kidney: Length: 12.0 cm. Echogenicity within normal limits. No mass or hydronephrosis visualized. Abdominal aorta: No aneurysm visualized. Other findings: Study significantly limited by overlapping bowel gas and soft tissue. ULTRASOUND HEPATIC ELASTOGRAPHY Device: Siemens Helix VTQ Patient position: Supine Transducer DAX Number of measurements: 10 Hepatic segment:  7 Median kPa: 1.8 IQR: 0.5 IQR/Median kPa ratio: 0.28 Data quality:  Good Diagnostic category:  < or = 5 kPa: high probability of being normal The use of hepatic elastography is applicable to patients with viral hepatitis and non-alcoholic fatty liver disease. At this time, there is insufficient data  for the referenced cut-off values and use in other causes of liver disease, including alcoholic liver disease. Patients, however, may be assessed by elastography and serve as their own reference standard/baseline. In patients with non-alcoholic liver disease, the values suggesting compensated advanced chronic liver disease (cACLD) may be lower, and patients may need additional testing with elasticity results of 7-9 kPa. Please note that abnormal hepatic elasticity and shear wave velocities may also be identified in clinical settings other than with hepatic fibrosis, such as: acute hepatitis, elevated right heart and central venous pressures including use of beta blockers, veno-occlusive disease (Budd-Chiari), infiltrative processes such as mastocytosis/amyloidosis/infiltrative tumor/lymphoma, extrahepatic cholestasis, with hyperemia in the post-prandial state, and with liver transplantation. Correlation with patient history, laboratory data, and clinical condition recommended. Diagnostic Categories: < or =5 kPa: high probability of being normal < or =9 kPa: in the absence of other known clinical signs, rules out cACLD >9 kPa and ?13 kPa: suggestive of cACLD, but needs further testing >13 kPa: highly suggestive of cACLD > or =17 kPa: highly suggestive of cACLD with an increased probability of clinically significant portal hypertension IMPRESSION: ULTRASOUND ABDOMEN: Fatty liver infiltration. Splenomegaly. Previous cholecystectomy. No ductal dilatation. Study limited by overlapping bowel gas and soft tissue. ULTRASOUND HEPATIC ELASTOGRAPHY: Median kPa:  1.8 Diagnostic category:  < or = 5 kPa: high probability of being normal Electronically Signed   By: Ranell Bring M.D.   On: 07/24/2023 14:55    Labs:  CBC: Recent Labs    09/07/22 0729 01/06/23 1743 01/07/23 0910 01/08/23 0432  WBC 11.2* 10.8* 7.1 6.8  HGB  14.9 19.0* 15.1* 14.9  HCT 44.5 56.4* 43.6 44.0  PLT 289 247 178 153    COAGS: No results for  input(s): INR, APTT in the last 8760 hours.  BMP: Recent Labs    09/03/22 0928 09/07/22 0729 01/06/23 1743 01/07/23 0910 01/08/23 0432  NA 132* 137 134*  --  138  K 3.9 3.6 3.7  --  3.0*  CL 103 103 105  --  104  CO2 21* 25 20*  --  25  GLUCOSE 288* 236* 183*  --  100*  BUN 8 14 15   --  6  CALCIUM  8.7* 9.7 8.9  --  7.7*  CREATININE 0.61 0.83 0.79 0.62 0.55  GFRNONAA >60 >60 >60 >60 >60    LIVER FUNCTION TESTS: Recent Labs    12/19/22 0902 01/06/23 1743 01/08/23 0432 01/29/23 0835  BILITOT 0.6 1.5* 0.8 <0.2  AST 68* 53* 40 73*  ALT 98* 78* 52* 101*  ALKPHOS 85 59 38 97  PROT 6.6 7.7 5.8* 6.5  ALBUMIN 4.3 4.0 2.9* 4.1    TUMOR MARKERS: No results for input(s): AFPTM, CEA, CA199, CHROMGRNA in the last 8760 hours.  Assessment and Plan:  Elevated liver enzymes.  Will proceed with image guided random liver biopsy today by Dr. Jenna.  Risks and benefits of random liver biopsy was discussed with the patient and/or patient's family including, but not limited to bleeding, infection, damage to adjacent structures or low yield requiring additional tests.  All of the questions were answered and there is agreement to proceed.  Consent signed and in chart.    Electronically Signed: SARI GORMAN LAMP, PA-C   07/30/2023, 1:05 PM      I spent a total of  30 Minutes   in face to face in clinical consultation, greater than 50% of which was counseling/coordinating care for liver biopsy.

## 2023-07-31 ENCOUNTER — Ambulatory Visit (HOSPITAL_COMMUNITY)
Admission: RE | Admit: 2023-07-31 | Discharge: 2023-07-31 | Disposition: A | Discharge status: Home / Self Care | Source: Ambulatory Visit | Attending: Nurse Practitioner | Admitting: Nurse Practitioner

## 2023-07-31 ENCOUNTER — Other Ambulatory Visit (HOSPITAL_COMMUNITY): Payer: Self-pay | Admitting: Nurse Practitioner

## 2023-07-31 ENCOUNTER — Other Ambulatory Visit: Payer: Self-pay

## 2023-07-31 DIAGNOSIS — Z87891 Personal history of nicotine dependence: Secondary | ICD-10-CM | POA: Diagnosis not present

## 2023-07-31 DIAGNOSIS — E282 Polycystic ovarian syndrome: Secondary | ICD-10-CM | POA: Diagnosis not present

## 2023-07-31 DIAGNOSIS — R7989 Other specified abnormal findings of blood chemistry: Secondary | ICD-10-CM

## 2023-07-31 DIAGNOSIS — E119 Type 2 diabetes mellitus without complications: Secondary | ICD-10-CM | POA: Insufficient documentation

## 2023-07-31 DIAGNOSIS — K7581 Nonalcoholic steatohepatitis (NASH): Secondary | ICD-10-CM | POA: Diagnosis not present

## 2023-07-31 DIAGNOSIS — Z7984 Long term (current) use of oral hypoglycemic drugs: Secondary | ICD-10-CM | POA: Diagnosis not present

## 2023-07-31 DIAGNOSIS — E785 Hyperlipidemia, unspecified: Secondary | ICD-10-CM | POA: Diagnosis not present

## 2023-07-31 LAB — CBC
HCT: 48.2 % — ABNORMAL HIGH (ref 36.0–46.0)
Hemoglobin: 16.4 g/dL — ABNORMAL HIGH (ref 12.0–15.0)
MCH: 30 pg (ref 26.0–34.0)
MCHC: 34 g/dL (ref 30.0–36.0)
MCV: 88.3 fL (ref 80.0–100.0)
Platelets: 229 K/uL (ref 150–400)
RBC: 5.46 MIL/uL — ABNORMAL HIGH (ref 3.87–5.11)
RDW: 13.2 % (ref 11.5–15.5)
WBC: 7.1 K/uL (ref 4.0–10.5)
nRBC: 0 % (ref 0.0–0.2)

## 2023-07-31 LAB — PROTIME-INR
INR: 1.1 (ref 0.8–1.2)
Prothrombin Time: 14.8 s (ref 11.4–15.2)

## 2023-07-31 LAB — GLUCOSE, CAPILLARY: Glucose-Capillary: 163 mg/dL — ABNORMAL HIGH (ref 70–99)

## 2023-07-31 MED ORDER — SODIUM CHLORIDE 0.9 % IV SOLN: INTRAVENOUS | Status: DC

## 2023-07-31 MED ORDER — FENTANYL CITRATE (PF) 100 MCG/2ML IJ SOLN
INTRAMUSCULAR | Status: AC | PRN
  Administered 2023-07-31: 50 ug via INTRAVENOUS

## 2023-07-31 MED ORDER — MIDAZOLAM HCL 2 MG/2ML IJ SOLN
INTRAMUSCULAR | Status: AC | PRN
  Administered 2023-07-31 (×2): 1 mg via INTRAVENOUS

## 2023-07-31 MED ORDER — MIDAZOLAM HCL 2 MG/2ML IJ SOLN
INTRAMUSCULAR | Status: AC
  Filled 2023-07-31: qty 2

## 2023-07-31 MED ORDER — LIDOCAINE HCL (PF) 1 % IJ SOLN
10.0000 mL | Freq: Once | INTRAMUSCULAR | Status: AC
  Administered 2023-07-31: 10 mL via INTRADERMAL

## 2023-07-31 MED ORDER — FENTANYL CITRATE (PF) 100 MCG/2ML IJ SOLN
INTRAMUSCULAR | Status: AC
  Filled 2023-07-31: qty 2

## 2023-07-31 NOTE — Discharge Instructions (Signed)
 Drink plenty of fluids over the next 2-3 days.  Needle Biopsy, Care After These instructions tell you how to care for yourself after your procedure. Your doctor may give you more instructions. Call your doctor if you have any problems or questions. What can I expect after the procedure? After a needle biopsy, it is common to have these things at the puncture site: Soreness. Bruising. Mild pain. These things should go away after a few days. Follow these instructions at home: Puncture site care  Wash your hands with soap and water for at least 20 seconds before and after you change your bandage (dressing). If you cannot use soap and water, use hand sanitizer. Follow instructions from your doctor about how to take care of your puncture site. This includes: Remove Dressing in 24 hours.  You May Shower In 24 Hours. DO NOT SCRUB over site.  Check your puncture site every day for signs of infection. Check for: Redness, swelling, or more pain. Fluid or blood. Warmth. Pus or a bad smell. General instructions Go back to your normal activities when your doctor says that it is safe. Ask if there is anything you cannot do as you heal. Take over-the-counter and prescription medicines only as told by your doctor. Do not take baths, swim, or use a hot tub for at least 5 days.  Keep all follow-up visits. Ask if you need an appointment to get your biopsy results. Contact a doctor if: You have a fever. You have redness, swelling, or more pain at the puncture site, and it lasts longer than a few days. You have fluid, blood, or pus coming from the puncture site. Your puncture site feels warm. Get help right away if: You have very bad bleeding from the puncture site. Summary After the procedure, it is common to have soreness, bruising, or mild pain at the puncture site. Check your puncture site every day for signs of infection, such as redness, swelling, or more pain. Get help right away if you have  very bad bleeding from your puncture site. This information is not intended to replace advice given to you by your health care provider. Make sure you discuss any questions you have with your health care provider. Document Revised: 06/13/2020 Document Reviewed: 06/13/2020 Elsevier Patient Education  2024 [employer].

## 2023-07-31 NOTE — Procedures (Signed)
 Pre procedural Dx: LFT abnormal  Post procedural Dx: Same  Technically successful US  guided biopsy of left lobe liver   EBL: None.   Complications: None immediate.   KANDICE Banner, MD Pager #: [phone]

## 2023-08-01 ENCOUNTER — Other Ambulatory Visit (HOSPITAL_COMMUNITY): Payer: Self-pay

## 2023-08-03 ENCOUNTER — Other Ambulatory Visit (HOSPITAL_COMMUNITY): Payer: Self-pay

## 2023-08-04 ENCOUNTER — Other Ambulatory Visit (HOSPITAL_COMMUNITY): Payer: Self-pay

## 2023-08-05 DIAGNOSIS — D485 Neoplasm of uncertain behavior of skin: Secondary | ICD-10-CM | POA: Diagnosis not present

## 2023-08-05 DIAGNOSIS — L821 Other seborrheic keratosis: Secondary | ICD-10-CM | POA: Diagnosis not present

## 2023-08-05 DIAGNOSIS — L820 Inflamed seborrheic keratosis: Secondary | ICD-10-CM | POA: Diagnosis not present

## 2023-08-05 DIAGNOSIS — D225 Melanocytic nevi of trunk: Secondary | ICD-10-CM | POA: Diagnosis not present

## 2023-08-05 DIAGNOSIS — L258 Unspecified contact dermatitis due to other agents: Secondary | ICD-10-CM | POA: Diagnosis not present

## 2023-08-05 DIAGNOSIS — Z1283 Encounter for screening for malignant neoplasm of skin: Secondary | ICD-10-CM | POA: Diagnosis not present

## 2023-08-11 LAB — SURGICAL PATHOLOGY

## 2023-08-12 DIAGNOSIS — G4733 Obstructive sleep apnea (adult) (pediatric): Secondary | ICD-10-CM | POA: Diagnosis not present

## 2023-08-14 DIAGNOSIS — K760 Fatty (change of) liver, not elsewhere classified: Secondary | ICD-10-CM | POA: Diagnosis not present

## 2023-08-17 ENCOUNTER — Other Ambulatory Visit (HOSPITAL_COMMUNITY): Payer: Self-pay

## 2023-08-17 ENCOUNTER — Other Ambulatory Visit (HOSPITAL_BASED_OUTPATIENT_CLINIC_OR_DEPARTMENT_OTHER): Payer: Self-pay

## 2023-08-20 ENCOUNTER — Other Ambulatory Visit: Payer: Self-pay

## 2023-08-20 ENCOUNTER — Other Ambulatory Visit (HOSPITAL_COMMUNITY): Payer: Self-pay

## 2023-08-24 ENCOUNTER — Other Ambulatory Visit (HOSPITAL_COMMUNITY): Payer: Self-pay

## 2023-08-25 ENCOUNTER — Other Ambulatory Visit: Payer: Self-pay

## 2023-08-25 ENCOUNTER — Other Ambulatory Visit (HOSPITAL_COMMUNITY): Payer: Self-pay

## 2023-08-26 DIAGNOSIS — D225 Melanocytic nevi of trunk: Secondary | ICD-10-CM | POA: Diagnosis not present

## 2023-08-26 DIAGNOSIS — D485 Neoplasm of uncertain behavior of skin: Secondary | ICD-10-CM | POA: Diagnosis not present

## 2023-09-03 DIAGNOSIS — G4733 Obstructive sleep apnea (adult) (pediatric): Secondary | ICD-10-CM | POA: Diagnosis not present

## 2023-09-09 DIAGNOSIS — L989 Disorder of the skin and subcutaneous tissue, unspecified: Secondary | ICD-10-CM | POA: Diagnosis not present

## 2023-09-10 DIAGNOSIS — E785 Hyperlipidemia, unspecified: Secondary | ICD-10-CM | POA: Diagnosis not present

## 2023-09-10 DIAGNOSIS — E1165 Type 2 diabetes mellitus with hyperglycemia: Secondary | ICD-10-CM | POA: Diagnosis not present

## 2023-09-10 DIAGNOSIS — K7581 Nonalcoholic steatohepatitis (NASH): Secondary | ICD-10-CM | POA: Diagnosis not present

## 2023-09-10 DIAGNOSIS — I10 Essential (primary) hypertension: Secondary | ICD-10-CM | POA: Diagnosis not present

## 2023-09-12 ENCOUNTER — Other Ambulatory Visit (HOSPITAL_COMMUNITY): Payer: Self-pay

## 2023-09-14 ENCOUNTER — Other Ambulatory Visit (HOSPITAL_COMMUNITY): Payer: Self-pay

## 2023-09-14 ENCOUNTER — Other Ambulatory Visit: Payer: Self-pay

## 2023-09-15 ENCOUNTER — Other Ambulatory Visit: Payer: Self-pay

## 2023-09-17 ENCOUNTER — Other Ambulatory Visit: Payer: Self-pay

## 2023-09-22 ENCOUNTER — Other Ambulatory Visit: Payer: Self-pay

## 2023-09-22 ENCOUNTER — Other Ambulatory Visit (HOSPITAL_COMMUNITY): Payer: Self-pay

## 2023-10-04 DIAGNOSIS — G4733 Obstructive sleep apnea (adult) (pediatric): Secondary | ICD-10-CM | POA: Diagnosis not present

## 2023-10-09 ENCOUNTER — Other Ambulatory Visit (HOSPITAL_BASED_OUTPATIENT_CLINIC_OR_DEPARTMENT_OTHER): Payer: Self-pay

## 2023-10-09 MED ORDER — FLUBLOK 0.5 ML IM SOSY
0.5000 mL | PREFILLED_SYRINGE | Freq: Once | INTRAMUSCULAR | 0 refills | Status: AC
  Filled 2023-10-09: qty 0.5, 1d supply, fill #0

## 2023-10-11 ENCOUNTER — Telehealth (HOSPITAL_COMMUNITY): Payer: Self-pay

## 2023-10-11 ENCOUNTER — Other Ambulatory Visit (HOSPITAL_COMMUNITY): Payer: Self-pay

## 2023-10-12 ENCOUNTER — Other Ambulatory Visit (HOSPITAL_COMMUNITY): Payer: Self-pay

## 2023-10-12 ENCOUNTER — Telehealth: Payer: Self-pay

## 2023-10-12 ENCOUNTER — Other Ambulatory Visit: Payer: Self-pay

## 2023-10-12 NOTE — Telephone Encounter (Signed)
*  Pulm  Pharmacy Patient Advocate Encounter   Received notification from Pt Calls Messages that prior authorization for Azelastine -Fluticasone  137-50MCG/ACT suspension   is required/requested.   Insurance verification completed.   The patient is insured through [facility].   Per test claim: PA required; PA submitted to above mentioned insurance via Latent Key/confirmation #/EOC [id number] Status is pending

## 2023-10-12 NOTE — Telephone Encounter (Signed)
 Pharmacy Patient Advocate Encounter  Received notification from [facility] that Prior Authorization for Azelastine -Fluticasone  137-50MCG/ACT suspension   has been APPROVED from 10/12/2023 to 10/10/2024

## 2023-10-13 ENCOUNTER — Other Ambulatory Visit (HOSPITAL_COMMUNITY): Payer: Self-pay

## 2023-10-13 ENCOUNTER — Other Ambulatory Visit: Payer: Self-pay

## 2023-10-13 MED ORDER — OZEMPIC (0.25 OR 0.5 MG/DOSE) 2 MG/3ML ~~LOC~~ SOPN
0.2500 mg | PEN_INJECTOR | SUBCUTANEOUS | 1 refills | Status: DC
  Filled 2023-10-13: qty 3, 30d supply, fill #0
  Filled 2023-11-06 – 2023-11-23 (×3): qty 3, 30d supply, fill #1

## 2023-10-17 ENCOUNTER — Other Ambulatory Visit (HOSPITAL_COMMUNITY): Payer: Self-pay

## 2023-10-19 ENCOUNTER — Other Ambulatory Visit (HOSPITAL_COMMUNITY): Payer: Self-pay

## 2023-10-20 ENCOUNTER — Other Ambulatory Visit: Payer: Self-pay

## 2023-10-20 ENCOUNTER — Other Ambulatory Visit (HOSPITAL_COMMUNITY): Payer: Self-pay

## 2023-11-09 ENCOUNTER — Other Ambulatory Visit (HOSPITAL_COMMUNITY): Payer: Self-pay

## 2023-11-11 ENCOUNTER — Other Ambulatory Visit (HOSPITAL_COMMUNITY): Payer: Self-pay

## 2023-11-11 ENCOUNTER — Other Ambulatory Visit: Payer: Self-pay

## 2023-11-11 DIAGNOSIS — E1165 Type 2 diabetes mellitus with hyperglycemia: Secondary | ICD-10-CM | POA: Diagnosis not present

## 2023-11-11 MED ORDER — METOCLOPRAMIDE HCL 10 MG PO TABS
10.0000 mg | ORAL_TABLET | Freq: Two times a day (BID) | ORAL | 1 refills | Status: AC
  Filled 2023-11-11: qty 60, 30d supply, fill #0
  Filled 2023-12-05: qty 60, 30d supply, fill #1

## 2023-11-16 ENCOUNTER — Other Ambulatory Visit: Payer: Self-pay

## 2023-11-17 ENCOUNTER — Other Ambulatory Visit (HOSPITAL_COMMUNITY): Payer: Self-pay

## 2023-11-18 DIAGNOSIS — K760 Fatty (change of) liver, not elsewhere classified: Secondary | ICD-10-CM | POA: Diagnosis not present

## 2023-11-18 DIAGNOSIS — E282 Polycystic ovarian syndrome: Secondary | ICD-10-CM | POA: Diagnosis not present

## 2023-11-18 DIAGNOSIS — G4733 Obstructive sleep apnea (adult) (pediatric): Secondary | ICD-10-CM | POA: Diagnosis not present

## 2023-11-18 DIAGNOSIS — E66813 Obesity, class 3: Secondary | ICD-10-CM | POA: Diagnosis not present

## 2023-11-18 DIAGNOSIS — E1165 Type 2 diabetes mellitus with hyperglycemia: Secondary | ICD-10-CM | POA: Diagnosis not present

## 2023-11-19 ENCOUNTER — Other Ambulatory Visit (HOSPITAL_COMMUNITY): Payer: Self-pay

## 2023-11-23 ENCOUNTER — Other Ambulatory Visit (HOSPITAL_COMMUNITY): Payer: Self-pay

## 2023-11-23 ENCOUNTER — Other Ambulatory Visit: Payer: Self-pay

## 2023-12-02 DIAGNOSIS — G4733 Obstructive sleep apnea (adult) (pediatric): Secondary | ICD-10-CM | POA: Diagnosis not present

## 2023-12-15 ENCOUNTER — Other Ambulatory Visit (HOSPITAL_COMMUNITY): Payer: Self-pay

## 2023-12-15 ENCOUNTER — Other Ambulatory Visit: Payer: Self-pay | Admitting: Internal Medicine

## 2023-12-15 ENCOUNTER — Other Ambulatory Visit: Payer: Self-pay

## 2023-12-16 ENCOUNTER — Other Ambulatory Visit (HOSPITAL_COMMUNITY): Payer: Self-pay

## 2023-12-16 ENCOUNTER — Other Ambulatory Visit: Payer: Self-pay

## 2023-12-16 MED ORDER — ALLOPURINOL 300 MG PO TABS
300.0000 mg | ORAL_TABLET | Freq: Every day | ORAL | 2 refills | Status: AC
  Filled 2023-12-16: qty 90, 90d supply, fill #0

## 2023-12-26 ENCOUNTER — Other Ambulatory Visit (HOSPITAL_COMMUNITY): Payer: Self-pay

## 2024-01-02 ENCOUNTER — Other Ambulatory Visit: Payer: Self-pay

## 2024-01-02 ENCOUNTER — Encounter (HOSPITAL_COMMUNITY): Payer: Self-pay | Admitting: Emergency Medicine

## 2024-01-02 ENCOUNTER — Emergency Department (HOSPITAL_COMMUNITY)
Admission: EM | Admit: 2024-01-02 | Discharge: 2024-01-02 | Disposition: A | Discharge status: Home / Self Care | Attending: Emergency Medicine | Admitting: Emergency Medicine

## 2024-01-02 DIAGNOSIS — J069 Acute upper respiratory infection, unspecified: Secondary | ICD-10-CM | POA: Insufficient documentation

## 2024-01-02 DIAGNOSIS — Z87442 Personal history of urinary calculi: Secondary | ICD-10-CM | POA: Insufficient documentation

## 2024-01-02 DIAGNOSIS — R059 Cough, unspecified: Secondary | ICD-10-CM | POA: Diagnosis not present

## 2024-01-02 DIAGNOSIS — K529 Noninfective gastroenteritis and colitis, unspecified: Secondary | ICD-10-CM | POA: Insufficient documentation

## 2024-01-02 DIAGNOSIS — J45909 Unspecified asthma, uncomplicated: Secondary | ICD-10-CM | POA: Insufficient documentation

## 2024-01-02 DIAGNOSIS — Z794 Long term (current) use of insulin: Secondary | ICD-10-CM | POA: Insufficient documentation

## 2024-01-02 DIAGNOSIS — Z7984 Long term (current) use of oral hypoglycemic drugs: Secondary | ICD-10-CM | POA: Diagnosis not present

## 2024-01-02 DIAGNOSIS — Z87891 Personal history of nicotine dependence: Secondary | ICD-10-CM | POA: Diagnosis not present

## 2024-01-02 DIAGNOSIS — I10 Essential (primary) hypertension: Secondary | ICD-10-CM | POA: Diagnosis not present

## 2024-01-02 DIAGNOSIS — E1165 Type 2 diabetes mellitus with hyperglycemia: Secondary | ICD-10-CM | POA: Insufficient documentation

## 2024-01-02 DIAGNOSIS — R112 Nausea with vomiting, unspecified: Secondary | ICD-10-CM | POA: Diagnosis present

## 2024-01-02 HISTORY — DX: Unspecified cirrhosis of liver: K74.60

## 2024-01-02 LAB — CBC
HCT: 49.5 % — ABNORMAL HIGH (ref 36.0–46.0)
Hemoglobin: 17.2 g/dL — ABNORMAL HIGH (ref 12.0–15.0)
MCH: 31.4 pg (ref 26.0–34.0)
MCHC: 34.7 g/dL (ref 30.0–36.0)
MCV: 90.3 fL (ref 80.0–100.0)
Platelets: 237 K/uL (ref 150–400)
RBC: 5.48 MIL/uL — ABNORMAL HIGH (ref 3.87–5.11)
RDW: 13.1 % (ref 11.5–15.5)
WBC: 11 K/uL — ABNORMAL HIGH (ref 4.0–10.5)
nRBC: 0 % (ref 0.0–0.2)

## 2024-01-02 LAB — COMPREHENSIVE METABOLIC PANEL WITH GFR
ALT: 171 U/L — ABNORMAL HIGH (ref 0–44)
AST: 140 U/L — ABNORMAL HIGH (ref 15–41)
Albumin: 4.2 g/dL (ref 3.5–5.0)
Alkaline Phosphatase: 105 U/L (ref 38–126)
Anion gap: 14 (ref 5–15)
BUN: 7 mg/dL (ref 6–20)
CO2: 19 mmol/L — ABNORMAL LOW (ref 22–32)
Calcium: 9.2 mg/dL (ref 8.9–10.3)
Chloride: 103 mmol/L (ref 98–111)
Creatinine, Ser: 0.53 mg/dL (ref 0.44–1.00)
GFR, Estimated: >60 mL/min
Glucose, Bld: 164 mg/dL — ABNORMAL HIGH (ref 70–99)
Potassium: 4.3 mmol/L (ref 3.5–5.1)
Sodium: 136 mmol/L (ref 135–145)
Total Bilirubin: 0.7 mg/dL (ref 0.0–1.2)
Total Protein: 7.3 g/dL (ref 6.5–8.1)

## 2024-01-02 LAB — URINALYSIS, ROUTINE W REFLEX MICROSCOPIC
Bilirubin Urine: NEGATIVE
Glucose, UA: 50 mg/dL — AB
Hgb urine dipstick: NEGATIVE
Ketones, ur: NEGATIVE mg/dL
Leukocytes,Ua: NEGATIVE
Nitrite: NEGATIVE
Protein, ur: NEGATIVE mg/dL
Specific Gravity, Urine: 1.017 (ref 1.005–1.030)
pH: 7 (ref 5.0–8.0)

## 2024-01-02 LAB — RESP PANEL BY RT-PCR (RSV, FLU A&B, COVID)  RVPGX2
Influenza A by PCR: NEGATIVE
Influenza B by PCR: NEGATIVE
Resp Syncytial Virus by PCR: NEGATIVE
SARS Coronavirus 2 by RT PCR: NEGATIVE

## 2024-01-02 LAB — LIPASE, BLOOD: Lipase: 22 U/L (ref 11–51)

## 2024-01-02 MED ORDER — METOCLOPRAMIDE HCL 5 MG/ML IJ SOLN
10.0000 mg | Freq: Once | INTRAMUSCULAR | Status: AC
  Administered 2024-01-02: 10 mg via INTRAVENOUS
  Filled 2024-01-02: qty 2

## 2024-01-02 MED ORDER — ACETAMINOPHEN 500 MG PO TABS
1000.0000 mg | ORAL_TABLET | Freq: Once | ORAL | Status: AC
  Administered 2024-01-02: 1000 mg via ORAL
  Filled 2024-01-02: qty 2

## 2024-01-02 MED ORDER — LACTATED RINGERS IV BOLUS
1000.0000 mL | Freq: Once | INTRAVENOUS | Status: AC
  Administered 2024-01-02: 1000 mL via INTRAVENOUS

## 2024-01-02 MED ORDER — PROMETHAZINE HCL 25 MG PO TABS: 25.0000 mg | ORAL_TABLET | Freq: Four times a day (QID) | ORAL | 0 refills | Status: AC | PRN

## 2024-01-02 NOTE — ED Triage Notes (Addendum)
 Pt here with c/o emesis since waking up this am. States unable to keep anything down. States took Reglan  around 6pm.  Also reports decreased urine output today.

## 2024-01-02 NOTE — ED Provider Notes (Signed)
 "  EMERGENCY DEPARTMENT AT [facility] Provider Note  CSN: [id number] Arrival date & time: 01/02/24 1959  Chief Complaint(s) Emesis  HPI Cheryl Black is a 38 y.o. female history of diabetes, gastroparesis, hypertension, hyperlipidemia presenting to the emergency department with vomiting.  Patient reports nausea and vomiting since this morning.  Reports decreased urine output but not dysuria.  Is also having cough, sore throat.  Cough nonproductive.  Was around family member with the flu, however took home flu test which was negative.  No abdominal pain, diarrhea.  She reports that symptoms do not feel similar to gastroparesis because when she has gastroparesis she has much more abdominal pain.  Had not noted any fevers or chills.   Past Medical History Past Medical History:  Diagnosis Date   Acid reflux    Asthma    Cirrhosis (HCC)    Clostridium difficile infection    in the past   Diabetes mellitus    Fatty liver    Fibrocystic breast disease    Gout    Hidradenitis    History of kidney stones    Hyperlipemia    Obesity    Polycystic ovary syndrome    PONV (postoperative nausea and vomiting)    Respiratory failure (HCC)    2018 and 2019   Sleep apnea    Smoker    Patient Active Problem List   Diagnosis Date Noted   Acute febrile illness 01/07/2023   Diarrhea 01/07/2023   SIRS (systemic inflammatory response syndrome) (HCC) 01/07/2023   Type 2 diabetes mellitus with hyperglycemia (HCC) 01/07/2023   Obesity, Class III, BMI 40-49.9 (morbid obesity) (HCC) 01/07/2023   Dysmenorrhea 09/08/2022   Sepsis due to cellulitis (HCC) 07/09/2022   Menometrorrhagia 07/09/2022   AKI (acute kidney injury) 07/09/2022   Dehydration 07/09/2022   Asthma, chronic 07/09/2022   Essential hypertension 07/09/2022   Asthma exacerbation 03/26/2022   Prolonged QT interval 03/26/2022   Mixed hyperlipidemia 03/26/2022   Hydrosalpinx 01/31/2021   Amenorrhea, secondary  01/31/2021   PCOS (polycystic ovarian syndrome) 10/04/2020   Encounter for gynecological examination with Papanicolaou smear of cervix 10/04/2020   DUB (dysfunctional uterine bleeding) 10/04/2020   GERD (gastroesophageal reflux disease) 12/10/2018   Acute respiratory failure with hypoxia (HCC) 03/18/2018   Type 2 diabetes mellitus with hyperlipidemia (HCC) 03/18/2018   Respiratory distress    Thickened endometrium 01/28/2018   Endometrial mass 01/28/2018   Abdominal pain 10/09/2017   Nausea & vomiting 09/24/2017   DOE (dyspnea on exertion) 09/16/2017   SOB (shortness of breath) 08/25/2017   Bronchospasm, acute 08/25/2017   Acute respiratory failure with hypoxemia (HCC) 08/25/2017   C. difficile enteritis 02/01/2012   Hyponatremia 01/07/2011   Hypokalemia 01/07/2011   Elevated LFTs 01/07/2011   Hypomagnesemia 01/07/2011   Type 2 diabetes mellitus with obesity 01/06/2011   Class 3 obesity (HCC) 01/06/2011   Polycystic ovary disease 01/06/2011   Tobacco abuse 01/06/2011   Steatohepatitis 06/08/2008   RUQ pain 06/08/2008   Transaminitis 06/08/2008   Home Medication(s) Prior to Admission medications  Medication Sig Start Date End Date Taking? Authorizing Provider  promethazine  (PHENERGAN ) 25 MG tablet Take 1 tablet (25 mg total) by mouth every 6 (six) hours as needed for nausea or vomiting. 01/02/24  Yes Francesca Elsie CROME, MD  acetaminophen  (TYLENOL ) 500 MG tablet Take 1,000 mg by mouth every 6 (six) hours as needed for moderate pain or headache.    [provider]  allopurinol  (ZYLOPRIM ) 300 MG tablet  Take 1 tablet (300 mg total) by mouth daily. 12/16/23     Azelastine -Fluticasone  137-50 MCG/ACT SUSP Place 1 spray into the nose in the morning and at bedtime. 01/26/23   Desai, Nikita S, MD  BIOTIN PO Take 1 tablet by mouth daily.    [provider]  Blood Glucose Monitoring Suppl (FREESTYLE LITE) w/Device KIT Use to test blood sugar 01/10/22     Continuous Glucose  Sensor (DEXCOM G7 SENSOR) MISC Change every 10 days as directed 12/29/22     dapagliflozin  propanediol (FARXIGA ) 10 MG TABS tablet Take 1 tablet (10 mg total) by mouth daily. 02/02/23     fluticasone -salmeterol (ADVAIR  HFA) 115-21 MCG/ACT inhaler Inhale 2 puffs into the lungs 2 (two) times daily. 12/10/22   Meade Verdon RAMAN, MD  Glucagon  (GVOKE HYPOPEN  2-PACK) 1 MG/0.2ML SOAJ use under the skin for hypoglycemia as needed 01/31/22     glucose blood test strip Use 2-3 times daily to test blood sugar 01/10/22     Insulin  Pen Needle 32G X 4 MM MISC Use as directed to inject insulin  4 times a day 01/10/22     Lancets (ONETOUCH DELICA PLUS LANCET33G) MISC Use to test blood sugar 2-3 times daily 01/10/22     loratadine  (CLARITIN ) 10 MG tablet Take 1 tablet (10 mg total) by mouth daily. 06/24/23   Meade Verdon RAMAN, MD  metFORMIN  (GLUCOPHAGE -XR) 500 MG 24 hr tablet Take 2 tablets (1,000 mg total) by mouth daily. 07/21/23     metoCLOPramide  (REGLAN ) 10 MG tablet Take 1 tablet (10 mg total) by mouth every 8 (eight) hours as needed for nausea. 07/11/22   Arrien, Mauricio Daniel, MD  metoCLOPramide  (REGLAN ) 10 MG tablet Take 1 tablet (10 mg total) by mouth 2 (two) times daily before meals as needed. 11/11/23     ondansetron  (ZOFRAN -ODT) 4 MG disintegrating tablet Take 1 tablet (4 mg total) by mouth every 8 (eight) hours as needed for nausea or vomiting. 01/08/23   Lue Elsie BROCKS, MD  pantoprazole  (PROTONIX ) 40 MG tablet Take 1 tablet (40 mg total) by mouth 2 (two) times daily. 06/24/23     Probiotic, Lactobacillus, CAPS Take 1 capsule by mouth daily. 11/28/19   [provider]  Semaglutide ,0.25 or 0.5MG /DOS, (OZEMPIC , 0.25 OR 0.5 MG/DOSE,) 2 MG/3ML SOPN Inject 0.25 mg into the skin once a week. 10/13/23     VITAMIN D  PO Take 1 tablet by mouth daily.    [provider]  sitaGLIPtin  (JANUVIA ) 100 MG tablet Take 1 tablet (100 mg total) by mouth daily. 07/21/23 09/14/23                                                                                                                                       Past Surgical History Past Surgical History:  Procedure Laterality Date   CHOLECYSTECTOMY  01/10/2011   Procedure: LAPAROSCOPIC CHOLECYSTECTOMY;  Surgeon: Oneil DELENA Budge;  Location:  [facility];  Service: General;  Laterality: N/A;   ESOPHAGOGASTRODUODENOSCOPY (EGD) WITH PROPOFOL  N/A 09/09/2018   RMR: Mild erosive reflux esophagitis.  Esophagus dilated for history of dysphagia.   HYDRADENITIS EXCISION Right 06/18/2012   Procedure: EXCISION HYDRIADENITIS SUPRATIVA  RIGHT AXILLA;  Surgeon: Elsie GORMAN Holland, MD;  Location: [facility];  Service: General;  Laterality: Right;   LIVER BIOPSY  01/10/2011   Procedure: LIVER BIOPSY;  Surgeon: Oneil DELENA Budge;  Location: [facility];  Service: General;;   MALONEY DILATION N/A 09/09/2018   Procedure: AGAPITO DILATION;  Surgeon: Shaaron Lamar HERO, MD;  Location: [facility];  Service: Endoscopy;  Laterality: N/A;   ROBOTIC ASSISTED TOTAL HYSTERECTOMY WITH BILATERAL SALPINGO OOPHERECTOMY Bilateral 09/05/2022   Procedure: XI ROBOTIC ASSISTED TOTAL HYSTERECTOMY WITH BILATERAL SALPINGO;  Surgeon: Jayne Vonn DEL, MD;  Location: [facility];  Service: Gynecology;  Laterality: Bilateral;   SKIN SPLIT GRAFT Right 06/18/2012   Procedure: SKIN GRAFT SPLIT THICKNESS RIGHT AXILLA(WITH TWO STANDARD SKIN BOARDS 3x 8 )  DONOR SITE RIGHT AND LEFT THIGHS;  Surgeon: Elsie GORMAN Holland, MD;  Location: [facility];  Service: General;  Laterality: Right;   Family History Family History  Problem Relation Age of Onset   Other Mother        pre-cancerous cells; had hyst   Emphysema Mother        smoker   COPD Mother    Diabetes Father    Hypertension Father    Cancer Sister        cervical; had hyst   Gout Brother    Hypertension Maternal Grandmother    Heart attack Maternal Grandfather    Cancer Maternal Grandfather        adenocarcinoma   Emphysema Maternal Grandfather        smoked   Hypertension Paternal  Grandmother    Diabetes Paternal Grandmother    Gout Paternal Grandmother    Asthma Maternal Uncle    Stroke Other        paternal great grandfather   Colon cancer Neg Hx    Liver disease Neg Hx    Liver cancer Neg Hx     Social History Social History[1] Allergies Ace inhibitors, Beta adrenergic blockers, Clindamycin /lincomycin, Augmentin  [amoxicillin -pot clavulanate], Benicar [olmesartan], Doxycycline, Egg protein-containing drug products, Influenza vaccine live, Influenza virus vaccine, Invokana [canagliflozin], Liraglutide, and Tape  Review of Systems Review of Systems  All other systems reviewed and are negative.   Physical Exam Vital Signs  I have reviewed the triage vital signs BP (!) 136/91   Pulse 96   Temp 100.3 F (37.9 C) (Oral)   Resp 18   Ht 5' 8 (1.727 m)   Wt 127 kg   LMP 12/12/2021   SpO2 93%   BMI 42.57 kg/m  Physical Exam Vitals and nursing note reviewed.  Constitutional:      General: She is not in acute distress.    Appearance: She is well-developed.  HENT:     Head: Normocephalic and atraumatic.     Mouth/Throat:     Mouth: Mucous membranes are dry.     Pharynx: Posterior oropharyngeal erythema present. No oropharyngeal exudate.  Eyes:     Pupils: Pupils are equal, round, and reactive to light.  Cardiovascular:     Rate and Rhythm: Regular rhythm. Tachycardia present.     Heart sounds: No murmur heard. Pulmonary:     Effort: Pulmonary effort is normal. No respiratory distress.     Breath sounds: Normal breath  sounds.  Abdominal:     General: Abdomen is flat.     Palpations: Abdomen is soft.     Tenderness: There is no abdominal tenderness.  Musculoskeletal:        General: No tenderness.     Right lower leg: No edema.     Left lower leg: No edema.  Skin:    General: Skin is warm and dry.  Neurological:     General: No focal deficit present.     Mental Status: She is alert. Mental status is at baseline.  Psychiatric:        Mood  and Affect: Mood normal.        Behavior: Behavior normal.     ED Results and Treatments Labs (all labs ordered are listed, but only abnormal results are displayed) Labs Reviewed  COMPREHENSIVE METABOLIC PANEL WITH GFR - Abnormal; Notable for the following components:      Result Value   CO2 19 (*)    Glucose, Bld 164 (*)    AST 140 (*)    ALT 171 (*)    All other components within normal limits  CBC - Abnormal; Notable for the following components:   WBC 11.0 (*)    RBC 5.48 (*)    Hemoglobin 17.2 (*)    HCT 49.5 (*)    All other components within normal limits  URINALYSIS, ROUTINE W REFLEX MICROSCOPIC - Abnormal; Notable for the following components:   Glucose, UA 50 (*)    Bacteria, UA RARE (*)    All other components within normal limits  RESP PANEL BY RT-PCR (RSV, FLU A&B, COVID)  RVPGX2  LIPASE, BLOOD                                                                                                                          Radiology No results found.  Pertinent labs & imaging results that were available during my care of the patient were reviewed by me and considered in my medical decision making (see MDM for details).  Medications Ordered in ED Medications  lactated ringers  bolus 1,000 mL (1,000 mLs Intravenous New Bag/Given 01/02/24 2058)  metoCLOPramide  (REGLAN ) injection 10 mg (10 mg Intravenous Given 01/02/24 2058)  lactated ringers  bolus 1,000 mL (1,000 mLs Intravenous New Bag/Given 01/02/24 2146)  acetaminophen  (TYLENOL ) tablet 1,000 mg (1,000 mg Oral Given 01/02/24 2154)  Procedures Procedures  (including critical care time)  Medical Decision Making / ED Course   MDM:  38 year old presenting with vomiting.  Patient overall well-appearing, mildly dehydrated appearing.  Mild posterior pharyngeal erythema without exudate.   Lungs clear.  No tenderness on exam.  Symptoms seem most likely due to underlying viral infection given cough, sore throat.  Vitals with borderline fever.  Will check flu and COVID swab here also.  No abdominal tenderness to suggest acute intra-abdominal process such as perforation, volvulus, obstruction, pancreatitis.  Will check labs including lipase.  Denies urinary symptoms but given decreased urination will obtain urinalysis.  Will reassess.  Will give fluids and additional antiemetic.  The patient is feeling better laboratory testing reassuring likely discharge.  Clinical Course as of 01/02/24 2320  Sat Jan 02, 2024  2318 Patient is feeling better after the fluids, Tylenol  and Reglan .  She has been able to tolerate some water .  Laboratory testing is overall reassuring.  Given that she is feeling better, feel she is stable for discharge to home.  Recommended follow-up with her primary physician.  Flu and COVID testing is negative but suspect viral infection remains most likely cause of patient's symptoms. Will discharge patient to home. All questions answered. Patient comfortable with plan of discharge. Return precautions discussed with patient and specified on the after visit summary.  [WS]    Clinical Course User Index [WS] Francesca, Elsie CROME, MD     Additional history obtained: -Additional history obtained from family -External records from outside source obtained and reviewed including: Chart review including previous notes, labs, imaging, consultation notes including prior notes    Lab Tests: -I ordered, reviewed, and interpreted labs.   The pertinent results include:   Labs Reviewed  COMPREHENSIVE METABOLIC PANEL WITH GFR - Abnormal; Notable for the following components:      Result Value   CO2 19 (*)    Glucose, Bld 164 (*)    AST 140 (*)    ALT 171 (*)    All other components within normal limits  CBC - Abnormal; Notable for the following components:   WBC 11.0 (*)     RBC 5.48 (*)    Hemoglobin 17.2 (*)    HCT 49.5 (*)    All other components within normal limits  URINALYSIS, ROUTINE W REFLEX MICROSCOPIC - Abnormal; Notable for the following components:   Glucose, UA 50 (*)    Bacteria, UA RARE (*)    All other components within normal limits  RESP PANEL BY RT-PCR (RSV, FLU A&B, COVID)  RVPGX2  LIPASE, BLOOD    Notable for signs of mild dehydration, negative flu/covid testing     Medicines ordered and prescription drug management: Meds ordered this encounter  Medications   lactated ringers  bolus 1,000 mL   metoCLOPramide  (REGLAN ) injection 10 mg   lactated ringers  bolus 1,000 mL   acetaminophen  (TYLENOL ) tablet 1,000 mg   promethazine  (PHENERGAN ) 25 MG tablet    Sig: Take 1 tablet (25 mg total) by mouth every 6 (six) hours as needed for nausea or vomiting.    Dispense:  30 tablet    Refill:  0    -I have reviewed the patients home medicines and have made adjustments as needed    Reevaluation: After the interventions noted above, I reevaluated the patient and found that their symptoms have improved  Co morbidities that complicate the patient evaluation  Past Medical History:  Diagnosis Date   Acid reflux    Asthma  Cirrhosis (HCC)    Clostridium difficile infection    in the past   Diabetes mellitus    Fatty liver    Fibrocystic breast disease    Gout    Hidradenitis    History of kidney stones    Hyperlipemia    Obesity    Polycystic ovary syndrome    PONV (postoperative nausea and vomiting)    Respiratory failure (HCC)    2018 and 2019   Sleep apnea    Smoker       Dispostion: Disposition decision including need for hospitalization was considered, and patient discharged from emergency department.    Final Clinical Impression(s) / ED Diagnoses Final diagnoses:  Gastroenteritis  Upper respiratory tract infection, unspecified type     This chart was dictated using voice recognition software.  Despite best  efforts to proofread,  errors can occur which can change the documentation meaning.     [1]  Social History Tobacco Use   Smoking status: Former    Current packs/day: 0.00    Average packs/day: 1 pack/day for 10.0 years (10.0 ttl pk-yrs)    Types: Cigarettes    Start date: 01/07/2008    Quit date: 01/06/2018    Years since quitting: 5.9    Passive exposure: Current   Smokeless tobacco: Never  Vaping Use   Vaping status: Never Used  Substance Use Topics   Alcohol use: Not Currently    Comment: occas   Drug use: No     Francesca Elsie CROME, MD 01/02/24 2320  "

## 2024-01-02 NOTE — Discharge Instructions (Addendum)
 We evaluated you for your nausea and vomiting.  Your symptoms improved in the emergency department with fluids and antiemetics.  Your blood tests and urine testing were reassuring.  We did not see any dangerous cause of your symptoms or severe dehydration.  Please try to stay hydrated.  We have prescribed you a refill of Phenergan  if your Reglan  is not working at home.  Please also take 1000 mg of Tylenol  or 600 mg of Motrin  every 6 hours as needed for fevers.  This can sometimes contribute to nausea and vomiting.  Please follow-up closely with your primary doctor.  If you have any new or worsening symptoms such as abdominal pain, uncontrolled vomiting, persistent fevers, blood in your vomit, bloody or black stools, or any other new symptoms, please return to the emergency department.

## 2024-01-03 DIAGNOSIS — G4733 Obstructive sleep apnea (adult) (pediatric): Secondary | ICD-10-CM | POA: Diagnosis not present

## 2024-01-08 ENCOUNTER — Other Ambulatory Visit (HOSPITAL_COMMUNITY): Payer: Self-pay

## 2024-01-11 ENCOUNTER — Other Ambulatory Visit: Payer: Self-pay

## 2024-01-11 ENCOUNTER — Other Ambulatory Visit: Payer: Self-pay | Admitting: Internal Medicine

## 2024-01-11 ENCOUNTER — Other Ambulatory Visit (HOSPITAL_COMMUNITY): Payer: Self-pay

## 2024-01-11 MED ORDER — METFORMIN HCL ER 500 MG PO TB24
1000.0000 mg | ORAL_TABLET | Freq: Every day | ORAL | 1 refills | Status: AC
  Filled 2024-01-11: qty 180, 90d supply, fill #0

## 2024-01-11 MED ORDER — OZEMPIC (0.25 OR 0.5 MG/DOSE) 2 MG/3ML ~~LOC~~ SOPN
0.2500 mg | PEN_INJECTOR | SUBCUTANEOUS | 3 refills | Status: AC
  Filled 2024-01-11: qty 3, 56d supply, fill #0

## 2024-01-11 MED ORDER — DEXCOM G7 SENSOR MISC
1.0000 [IU] | 5 refills | Status: AC
  Filled 2024-01-11: qty 3, 30d supply, fill #0
  Filled 2024-02-04: qty 9, 90d supply, fill #1

## 2024-01-12 ENCOUNTER — Other Ambulatory Visit (HOSPITAL_COMMUNITY): Payer: Self-pay

## 2024-01-12 ENCOUNTER — Encounter: Payer: Self-pay | Admitting: Pharmacist

## 2024-01-12 ENCOUNTER — Other Ambulatory Visit: Payer: Self-pay

## 2024-01-12 MED ORDER — LORATADINE 10 MG PO TABS
10.0000 mg | ORAL_TABLET | Freq: Every day | ORAL | 1 refills | Status: AC
  Filled 2024-01-12: qty 90, 90d supply, fill #0

## 2024-01-13 ENCOUNTER — Other Ambulatory Visit: Payer: Self-pay

## 2024-01-13 ENCOUNTER — Other Ambulatory Visit (HOSPITAL_COMMUNITY): Payer: Self-pay

## 2024-01-14 ENCOUNTER — Other Ambulatory Visit: Payer: Self-pay | Admitting: Nurse Practitioner

## 2024-01-14 ENCOUNTER — Other Ambulatory Visit: Payer: Self-pay

## 2024-01-14 DIAGNOSIS — K7402 Hepatic fibrosis, advanced fibrosis: Secondary | ICD-10-CM

## 2024-01-14 DIAGNOSIS — K760 Fatty (change of) liver, not elsewhere classified: Secondary | ICD-10-CM

## 2024-01-27 ENCOUNTER — Ambulatory Visit
Admission: RE | Admit: 2024-01-27 | Discharge: 2024-01-27 | Disposition: A | Source: Ambulatory Visit | Attending: Nurse Practitioner

## 2024-01-27 DIAGNOSIS — K760 Fatty (change of) liver, not elsewhere classified: Secondary | ICD-10-CM

## 2024-01-27 DIAGNOSIS — K7402 Hepatic fibrosis, advanced fibrosis: Secondary | ICD-10-CM

## 2024-02-04 ENCOUNTER — Other Ambulatory Visit: Payer: Self-pay

## 2024-02-05 ENCOUNTER — Other Ambulatory Visit: Payer: Self-pay
